# Patient Record
Sex: Female | Born: 1945 | Hispanic: No | Marital: Single | Smoking: Never smoker
Health system: Southern US, Community
[De-identification: ages and names within clinical notes are randomized; demographics above are authoritative.]

## PROBLEM LIST (undated history)

## (undated) DIAGNOSIS — M129 Arthropathy, unspecified: Secondary | ICD-10-CM

## (undated) DIAGNOSIS — E785 Hyperlipidemia, unspecified: Secondary | ICD-10-CM

## (undated) DIAGNOSIS — E669 Obesity, unspecified: Secondary | ICD-10-CM

## (undated) DIAGNOSIS — R002 Palpitations: Secondary | ICD-10-CM

## (undated) DIAGNOSIS — I10 Essential (primary) hypertension: Secondary | ICD-10-CM

## (undated) DIAGNOSIS — R0609 Other forms of dyspnea: Secondary | ICD-10-CM

## (undated) DIAGNOSIS — G5622 Lesion of ulnar nerve, left upper limb: Secondary | ICD-10-CM

## (undated) DIAGNOSIS — C50919 Malignant neoplasm of unspecified site of unspecified female breast: Secondary | ICD-10-CM

## (undated) DIAGNOSIS — M109 Gout, unspecified: Secondary | ICD-10-CM

## (undated) DIAGNOSIS — E210 Primary hyperparathyroidism: Secondary | ICD-10-CM

## (undated) DIAGNOSIS — G5600 Carpal tunnel syndrome, unspecified upper limb: Secondary | ICD-10-CM

## (undated) DIAGNOSIS — R5383 Other fatigue: Secondary | ICD-10-CM

## (undated) DIAGNOSIS — E790 Hyperuricemia without signs of inflammatory arthritis and tophaceous disease: Secondary | ICD-10-CM

## (undated) DIAGNOSIS — R9431 Abnormal electrocardiogram [ECG] [EKG]: Secondary | ICD-10-CM

## (undated) HISTORY — DX: Abnormal electrocardiogram (ECG) (EKG): R94.31

## (undated) HISTORY — DX: Other fatigue: R53.83

## (undated) HISTORY — PX: COLONOSCOPY: SHX174

## (undated) HISTORY — DX: Essential (primary) hypertension: I10

## (undated) HISTORY — PX: ABDOMINAL HYSTERECTOMY: SHX81

## (undated) HISTORY — DX: Lesion of ulnar nerve, left upper limb: G56.22

## (undated) HISTORY — DX: Gout, unspecified: M10.9

## (undated) HISTORY — DX: Malignant neoplasm of unspecified site of unspecified female breast: C50.919

## (undated) HISTORY — DX: Palpitations: R00.2

## (undated) HISTORY — DX: Hyperuricemia without signs of inflammatory arthritis and tophaceous disease: E79.0

## (undated) HISTORY — DX: Arthropathy, unspecified: M12.9

## (undated) HISTORY — DX: Hyperlipidemia, unspecified: E78.5

## (undated) HISTORY — PX: BREAST BIOPSY: SHX20

## (undated) HISTORY — DX: Obesity, unspecified: E66.9

## (undated) HISTORY — DX: Carpal tunnel syndrome, unspecified upper limb: G56.00

---

## 1978-06-26 HISTORY — PX: FACIAL COSMETIC SURGERY: SHX629

## 2009-05-12 ENCOUNTER — Encounter: Payer: Self-pay | Admitting: Internal Medicine

## 2009-11-09 ENCOUNTER — Ambulatory Visit: Payer: Self-pay | Admitting: Internal Medicine

## 2009-11-09 DIAGNOSIS — E785 Hyperlipidemia, unspecified: Secondary | ICD-10-CM

## 2009-11-09 DIAGNOSIS — M129 Arthropathy, unspecified: Secondary | ICD-10-CM | POA: Insufficient documentation

## 2009-11-09 DIAGNOSIS — M109 Gout, unspecified: Secondary | ICD-10-CM

## 2009-11-09 DIAGNOSIS — I10 Essential (primary) hypertension: Secondary | ICD-10-CM | POA: Insufficient documentation

## 2009-12-01 ENCOUNTER — Ambulatory Visit: Payer: Self-pay | Admitting: Internal Medicine

## 2009-12-01 LAB — CONVERTED CEMR LAB: Vit D, 25-Hydroxy: 29 ng/mL — ABNORMAL LOW (ref 30–89)

## 2009-12-03 LAB — CONVERTED CEMR LAB
ALT: 24 units/L (ref 0–35)
AST: 21 units/L (ref 0–37)
Alkaline Phosphatase: 48 units/L (ref 39–117)
BUN: 13 mg/dL (ref 6–23)
Basophils Absolute: 0.1 10*3/uL (ref 0.0–0.1)
Bilirubin, Direct: 0.1 mg/dL (ref 0.0–0.3)
CO2: 29 meq/L (ref 19–32)
GFR calc non Af Amer: 83.95 mL/min (ref >60)
Glucose, Bld: 87 mg/dL (ref 70–99)
HCT: 39.6 % (ref 36.0–46.0)
Lymphs Abs: 2.9 10*3/uL (ref 0.7–4.0)
Microalb Creat Ratio: 0.6 mg/g (ref 0.0–30.0)
Monocytes Absolute: 0.5 10*3/uL (ref 0.1–1.0)
Monocytes Relative: 7.5 % (ref 3.0–12.0)
Platelets: 275 10*3/uL (ref 150.0–400.0)
RDW: 13.7 % (ref 11.5–14.6)
Sodium: 144 meq/L (ref 135–145)
TSH: 3.58 microintl units/mL (ref 0.35–5.50)
Total Protein: 6.9 g/dL (ref 6.0–8.3)

## 2010-01-10 ENCOUNTER — Ambulatory Visit: Payer: Self-pay | Admitting: Internal Medicine

## 2010-01-10 DIAGNOSIS — E669 Obesity, unspecified: Secondary | ICD-10-CM

## 2010-02-03 ENCOUNTER — Encounter: Payer: Self-pay | Admitting: Internal Medicine

## 2010-07-19 ENCOUNTER — Telehealth: Payer: Self-pay | Admitting: Internal Medicine

## 2010-07-26 NOTE — Assessment & Plan Note (Signed)
Summary: fu per pt/njr   Vital Signs:  Patient profile:   65 year old female Menstrual status:  hysterectomy Weight:      231 pounds Pulse rate:   66 / minute BP sitting:   120 / 80  (left arm) Cuff size:   regular  Vitals Entered By: Romualdo Bolk, CMA (AAMA) (January 10, 2010 10:50 AM) CC: Follow-up visit on labs, Hypertension Management   History of Present Illness: Christy Young comes in today  for follow up of multiple medical problems . Since last viist is beginning to change lifestyle to deal with lipids and health risk. Her to recheck labs   Taking lovaza but no whelchol .  hadnt been able to tolerate low dose statin in the past.    Hypertension History:      She denies headache, chest pain, palpitations, dyspnea with exertion, orthopnea, PND, peripheral edema, visual symptoms, neurologic problems, syncope, and side effects from treatment.  She notes no problems with any antihypertensive medication side effects.        Positive major cardiovascular risk factors include female age 79 years old or older, hyperlipidemia, and hypertension.  Negative major cardiovascular risk factors include non-tobacco-user status.     Preventive Screening-Counseling & Management  Alcohol-Tobacco     Alcohol drinks/day: 1     Alcohol type: wine     Smoking Status: never  Caffeine-Diet-Exercise     Caffeine use/day: 1     Does Patient Exercise: yes     Times/week: 3  Current Medications (verified): 1)  Lovaza 1 Gm Caps (Omega-3-Acid Ethyl Esters) .... 2 Tabs X 2 Daily 2)  Allopurinol 100 Mg Tabs (Allopurinol) .... 2 Two Times A Day 3)  Bystolic 5 Mg Tabs (Nebivolol Hcl) .Marland Kitchen.. 1x A Day  Allergies (verified): 1)  ! Penicillin 2)  Crestor (Rosuvastatin Calcium)  Past History:  Past medical, surgical, family and social histories (including risk factors) reviewed, and no changes noted (except as noted below).  Past Medical History: Reviewed history from 11/09/2009 and no changes  required. Hyperlipidemia Hypertension  in 30ss   meds  since 59  G3P2 Palpitations  hospitalized 2005 felt from stress neg cards eval.  Past Surgical History: Reviewed history from 11/09/2009 and no changes required. Hysterectomy facial surgery 1980  Past History:  Care Management: None current  Family History: Reviewed history from 11/09/2009 and no changes required. Father: DM, alcholic- deceased Mother: HBP, arthritis Siblings: sister non-hodgkin lymphoma cancer Son age  15  MI      32 Living son healthy age 73   Social History: Reviewed history from 11/09/2009 and no changes required. Occupation:  retired Patent examiner .   3 yrs of college  bereaved parent  Retired   Single Never Smoked Alcohol use-yes Drug use-no Regular exercise-yes moved In June   no pets .     Review of Systems  The patient denies anorexia, fever, chest pain, syncope, and dyspnea on exertion.         non umbnessn or vision change  Physical Exam  General:  Well-developed,well-nourished,in no acute distress; alert,appropriate and cooperative throughout examination labs sreviewed   anc counseled  ldl over 200 Bg normal  Impression & Recommendations:  Problem # 1:  HYPERLIPIDEMIA (ICD-272.4)  se of statins in the past    problematic  lifestyle intervention should be emphasized    weight watchers .   also added back welchol  sample and rx  Her updated medication list for this problem  includes:    Lovaza 1 Gm Caps (Omega-3-acid ethyl esters) .Marland Kitchen... 2 tabs x 2 daily    Welchol 3.75 Gm Pack (Colesevelam hcl) .Marland Kitchen... 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 2 by mouth once daily  Problem # 3:  HYPERTENSION (ICD-401.9)  Her updated medication list for this problem  includes:    Bystolic 5 Mg Tabs (Nebivolol hcl) .Marland Kitchen... 1x a day  Problem # 4:  ARTHRITIS (ICD-716.90) Assessment: Comment Only  Problem # 5:  OBESITY (ICD-278.00) lifestyle intervention  Ht: 63.5 (11/09/2009)   Wt: 231 (01/10/2010)   BMI: 40.11 (11/09/2009)  Complete Medication List: 1)  Lovaza 1 Gm Caps (Omega-3-acid ethyl esters) .... 2 tabs x 2 daily 2)  Allopurinol 100 Mg Tabs (Allopurinol) .... 2 by mouth once daily 3)  Bystolic 5 Mg Tabs (Nebivolol hcl) .Marland Kitchen.. 1x a day 4)  Welchol 3.75 Gm Pack (Colesevelam hcl) .Marland Kitchen.. 1 pack    q d   in 4 oz of water  Hypertension Assessment/Plan:      The patient's hypertensive risk group is category B: At least one risk factor (excluding diabetes) with no target organ damage.  Today's blood pressure is 120/80.  Her blood pressure goal is < 140/90.  Patient Instructions: 1)  continue taking vit d supplement  2)  Losing weight will help Your   lipids . 3)   welchol  could help    4)  recheck lipid panel in 3 months  and rov  Prescriptions: WELCHOL 3.75 GM PACK (COLESEVELAM HCL) 1 pack    q d   in 4 oz of water  #30 days x 3   Entered and Authorized by:   Madelin Headings MD   Signed by:   Madelin Headings MD on 01/10/2010   Method used:   Print then Give to Patient   RxID:   [id number] LOVAZA 1 GM CAPS (OMEGA-3-ACID ETHYL ESTERS) 2 tabs x 2 daily  #120 x 6   Entered and Authorized by:   Madelin Headings MD   Signed by:   Madelin Headings MD on 01/10/2010   Method used:   Electronically to        [facility]  [phone][facility] (retail)       [address] [place], [place]  [address]       Ph: [phone] or [phone]       Fax: [phone]   RxID:   [id number] ALLOPURINOL 100 MG TABS (ALLOPURINOL) 2 by mouth once daily  #60 x 6   Entered and Authorized by:   Madelin Headings MD   Signed by:   Madelin Headings MD on 01/10/2010   Method used:   Electronically to        [facility]  [phone]* (retail)       [address]  [place], [place]  [address]       Ph: [phone] or [phone]       Fax: [phone]   RxID:   [id number]

## 2010-07-26 NOTE — Assessment & Plan Note (Signed)
Summary: BRAND NEW PT/TO EST/CJR   Vital Signs:  Patient profile:   65 year old female Menstrual status:  hysterectomy Height:      63.5 inches Weight:      229.2 pounds BMI:     40.11 Temp:     98.3 degrees F oral Pulse rate:   87 / minute BP sitting:   136 / 90  (right arm)  Vitals Entered By: Kathrynn Speed CMA (Nov 09, 2009 1:57 PM)  Nutrition Counseling: Patient's BMI is greater than 25 and therefore counseled on weight management options. CC: New pt establish     Menstrual Status hysterectomy   History of Present Illness: Christy Young comes in comes in today  for new patient visit. Her previous care has been in [place].  Her medical problems include : HT :  ? avapro was a problem   and    had memory problems  but then changes ot bystolic.   Has own machine at home.  LIPIDS; On lovaza for 2 years .  last checked   about  Dec 2010.   ? if welchol  for  leg cramps.  Crestor  caused  caused  muyscle problems .    Lipitor and zocor    both caused muscle aches  Niaspan  Trying to exercise to help muscles .    ? no VIt d level.  Gout :  on allopurinol for  about 4-5 years  and increased  for 1 year.   supressing gout.     Preventive Care Screening  Mammogram:    Date:  07/27/2009    Next Due:  01/2010    Results:  abnormal   Bone Density:    Date:  05/26/2009    Results:  normal std dev  Last Tetanus Booster:    Date:  03/26/2009    Results:  Tdap   Colonoscopy:    Date:  06/26/2006    Results:  normal    Preventive Screening-Counseling & Management  Alcohol-Tobacco     Alcohol drinks/day: 1     Alcohol type: wine     Smoking Status: never  Caffeine-Diet-Exercise     Caffeine use/day: 1     Does Patient Exercise: yes     Times/week: 3  Safety-Violence-Falls     Seat Belt Use: yes     Firearms in the Home: no firearms in the home     Smoke Detectors: yes      Drug Use:  no.    Current Medications (verified): 1)  Lovaza 1 Gm Caps (Omega-3-Acid Ethyl  Esters) .... 2 Tabs X 2 Daily 2)  Allopurinol 100 Mg Tabs (Allopurinol) .... 2 Two Times A Day 3)  Bystolic 5 Mg Tabs (Nebivolol Hcl) .Marland Kitchen.. 1x A Day  Allergies (verified): 1)  ! Penicillin 2)  Crestor (Rosuvastatin Calcium)  Past History:  Past Medical History: Hyperlipidemia Hypertension  in 30ss   meds  since 74  G3P2 Palpitations  hospitalized 2005 felt from stress neg cards eval.  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   Smoking Status:  never Caffeine use/day:  1 Does Patient Exercise:  yes Occupation:  employed Drug Use:  no Seat Belt Use:  yes  Review of Systems  The patient denies anorexia, fever, vision loss, decreased hearing, hoarseness, chest pain, syncope, dyspnea on exertion, peripheral edema, prolonged cough, headaches, hemoptysis, abdominal pain, melena, hematochezia, severe indigestion/heartburn, hematuria, muscle weakness, difficulty walking, depression, abnormal bleeding, enlarged lymph nodes, and angioedema.    Physical Exam  General:  Well-developed,well-nourished,in no acute distress; alert,appropriate and cooperative throughout examination Head:  normocephalic and atraumatic.   Eyes:  vision grossly intact and pupils equal.  glases  Ears:  R ear normal, L ear normal, and no external deformities.   Neck:  No deformities, masses, or tenderness noted. Lungs:  Normal respiratory effort, chest expands symmetrically. Lungs are clear to auscultation, no crackles or wheezes. Heart:  Normal rate and regular rhythm. S1 and  S2 normal without gallop, murmur, click, rub or other extra sounds. Abdomen:  Bowel sounds positive,abdomen soft and non-tender without masses, organomegaly or   noted. Pulses:  pulses intact without delay   Extremities:  no clubbing cyanosis or edema  Neurologic:  non focal  Skin:  turgor normal, color normal, no ecchymoses, and no petechiae.   Cervical Nodes:  No lymphadenopathy noted Psych:  Oriented X3, good eye contact, not anxious appearing, and not depressed appearing.     Impression & Recommendations:  Problem # 1:  HYPERTENSION (ICD-401.9)  Her updated medication list for this problem includes:    Bystolic 5 Mg Tabs (Nebivolol hcl) .Marland Kitchen... 1x a day  BP today: 136/90  Problem # 2:  HYPERLIPIDEMIA (ICD-272.4)  Her updated medication list for this problem includes:    Lovaza 1 Gm Caps (Omega-3-acid ethyl esters) .Marland Kitchen... 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 2 two times a day  Complete Medication List: 1)  Lovaza 1 Gm Caps (Omega-3-acid ethyl esters) .... 2 tabs x 2 daily 2)  Allopurinol 100 Mg Tabs (Allopurinol) .... 2 two times a day 3)  Bystolic 5 Mg Tabs (Nebivolol hcl) .Marland Kitchen.. 1x a day  Patient Instructions: 1)  get a copy of  last full set of labs and immunizations .  2)  and get [place] a copy so we can decide on further  3)  schedule  lipids and lfts,.  and uric acid  , BMp, CBCdiff , Tsh , Vit d level     4)  Dx elevated lipids,   HT ,  , gout  muscle    cramps  5)  then plan follow up .  6)  Notify  us   for refills .  Prescriptions: BYSTOLIC 5 MG TABS (NEBIVOLOL HCL) 1x a day  #90 x 1   Entered and Authorized by:   Madelin Headings MD   Signed by:   Madelin Headings MD on 11/09/2009   Method used:   Faxed to ...       [facility] [address] ([facility]-order)       [address] [place], [place]  [address]       Ph: [phone]       Fax: [phone]   RxID:   [id number]  Appended Document: BRAND NEW PT/TO EST/CJR reviewed labs from october .  please add on hg a1c , urine microalbumin / creatinine ratio   to above labs ordered   dx hyperglycemia .        Appended Document: BRAND NEW PT/TO EST/CJR Lab orders added on.

## 2010-07-28 NOTE — Progress Notes (Signed)
Summary: Pt req script for Bystolic to [facility]. Script for mammogram  Phone Note Refill Request Call back at Home Phone [phone] Message from:  Patient on July 19, 2010 2:02 PM  Refills Requested: Medication #1:  BYSTOLIC 5 MG TABS 1x a day   Dosage confirmed as above?Dosage Confirmed   Supply Requested: 3 months Pls call in to [facility] mail order.   Also pt needs to get a script  to get a mammogram done. Pt will pick up script for mammogram when its ready.    Method Requested: Telephone to [facility] mail order Initial call taken by: Lucy Antigua,  July 19, 2010 2:03 PM  Follow-up for Phone Call        Spoke with pt and she needs a diagnostic mmg done for a 6 month follow up on her mmg. I told pt that we would have rx ready to pick up tomorrow. Rx sent to pharmacy.  Pt is aware that she needs a fasting lipids dx 272.4 and rov before next refill. Follow-up by: Romualdo Bolk, CMA Duncan Dull),  July 19, 2010 2:46 PM  Additional Follow-up for Phone Call Additional follow up Details #1::        I calle pt and sch her for fup with Dr Fabian Sharp 08/31/10 and fasting lipids on 08/24/10, as noted.    Additional Follow-up by: Lucy Antigua,  July 20, 2010 10:29 AM    Prescriptions: BYSTOLIC 5 MG TABS (NEBIVOLOL HCL) 1x a day  #90 x 0   Entered by:   Romualdo Bolk, CMA (AAMA)   Authorized by:   Madelin Headings MD   Signed by:   Romualdo Bolk, CMA (AAMA) on 07/19/2010   Method used:   Faxed to ...       [facility] [address] ([facility]-order)       [address] [place], [place]  [address]       Ph: [phone]       Fax: [phone]   RxID:   [id number] BYSTOLIC 5 MG TABS (NEBIVOLOL HCL) 1x a day  #90 x 0   Entered by:   Romualdo Bolk, CMA (AAMA)   Authorized by:   Madelin Headings MD   Signed by:   Romualdo Bolk, CMA (AAMA) on 07/19/2010   Method used:   Printed then faxed to ...       [facility]  (mail-order)       [address] [place], [place]  [address]       Ph: [phone]       Fax: [phone]   RxID:   [id number]  Rx that was printed was shreaded. Romualdo Bolk, CMA (AAMA)  July 19, 2010 3:22 PM

## 2010-08-01 ENCOUNTER — Other Ambulatory Visit: Payer: Self-pay | Admitting: Internal Medicine

## 2010-08-01 DIAGNOSIS — Z1231 Encounter for screening mammogram for malignant neoplasm of breast: Secondary | ICD-10-CM

## 2010-08-04 ENCOUNTER — Ambulatory Visit
Admission: RE | Admit: 2010-08-04 | Discharge: 2010-08-04 | Disposition: A | Discharge status: Home / Self Care | Payer: BC Managed Care – PPO | Source: Ambulatory Visit | Attending: Internal Medicine | Admitting: Internal Medicine

## 2010-08-04 DIAGNOSIS — Z1231 Encounter for screening mammogram for malignant neoplasm of breast: Secondary | ICD-10-CM

## 2010-08-24 ENCOUNTER — Other Ambulatory Visit: Payer: Self-pay

## 2010-08-31 ENCOUNTER — Ambulatory Visit: Payer: Self-pay | Admitting: Internal Medicine

## 2010-09-14 ENCOUNTER — Other Ambulatory Visit (INDEPENDENT_AMBULATORY_CARE_PROVIDER_SITE_OTHER): Payer: PRIVATE HEALTH INSURANCE | Admitting: Internal Medicine

## 2010-09-14 DIAGNOSIS — E785 Hyperlipidemia, unspecified: Secondary | ICD-10-CM

## 2010-09-14 LAB — LDL CHOLESTEROL, DIRECT: Direct LDL: 178.8 mg/dL

## 2010-09-14 LAB — LIPID PANEL
HDL: 56.5 mg/dL (ref >39.00)
Total CHOL/HDL Ratio: 5
Triglycerides: 165 mg/dL — ABNORMAL HIGH (ref 0.0–149.0)

## 2010-09-15 ENCOUNTER — Encounter: Payer: Self-pay | Admitting: Internal Medicine

## 2010-09-20 ENCOUNTER — Encounter: Payer: Self-pay | Admitting: Internal Medicine

## 2010-09-20 ENCOUNTER — Ambulatory Visit (INDEPENDENT_AMBULATORY_CARE_PROVIDER_SITE_OTHER): Payer: PRIVATE HEALTH INSURANCE | Admitting: Internal Medicine

## 2010-09-20 VITALS — BP 130/88 | HR 72 | Wt 232.0 lb

## 2010-09-20 DIAGNOSIS — R002 Palpitations: Secondary | ICD-10-CM

## 2010-09-20 DIAGNOSIS — M109 Gout, unspecified: Secondary | ICD-10-CM

## 2010-09-20 DIAGNOSIS — E669 Obesity, unspecified: Secondary | ICD-10-CM

## 2010-09-20 DIAGNOSIS — E785 Hyperlipidemia, unspecified: Secondary | ICD-10-CM

## 2010-09-20 DIAGNOSIS — I10 Essential (primary) hypertension: Secondary | ICD-10-CM

## 2010-09-20 NOTE — Patient Instructions (Signed)
Intensify life style  Intervention. As we discussed  Plan medica wellness check up    In the summer

## 2010-09-20 NOTE — Progress Notes (Signed)
  Subjective:    Patient ID: Christy Young, female    DOB: Oct 31, 1945, 65 y.o.   MRN: [id number]  HPI Patient comes in today for follow-up of multiple medical problems. See last ov and rec trial of welchol and lsi . HT NO change  LIPIDS: Back on welchol    3 day.  For about a month or so.  Co q 10.  And vit D .   She had the history of side effects of the Crestor   For now and no se   .Marland KitchenShe is  eating better..healthier  Decrease meats increase vegge fruit  And achy is better. .    Trying to be active . "is addicted to food"    No recent gout flare needs refills     Review of Systems No changes hearing vision chest pain shortness of breath fall bruising or bleeding. Rest as per HPI or [place]  Past Medical History  Diagnosis Date  . Hyperlipidemia   . Hypertension     meds since age 27   . Palpitations     hospitalized 2005 felt from stress neg cards eval   Past Surgical History  Procedure Date  . Abdominal hysterectomy   . Facial cosmetic surgery 1980    reports that she has never smoked. She does not have any smokeless tobacco history on file. She reports that she drinks alcohol. She reports that she does not use illicit drugs. family history includes Alcohol abuse in her father; Arthritis in her mother; Diabetes in her father; Heart attack (age of onset:39) in her son; Hypertension in her mother; and Lymphoma in her sister. Allergies  Allergen Reactions  . Penicillins   . Rosuvastatin     REACTION: leg cramps.       Objective:   Physical Exam Wt Readings from Last 3 Encounters:  09/20/10 232 lb (105.235 kg)  01/10/10 231 lb (104.781 kg)  11/09/09 229 lb 3.2 oz (103.964 kg)  WDWN in NAD  HEENT grossly normal Chest nl resp  CV s1 s2 reg rhytm nl perfusion NEuro: non focal  Reviewed labs  Elevated LIPID and ldl in 170 range .    Assessment & Plan:  HYperlipidemia  This is not improved but she just went back on the well: seems to not have a side effect. Dietary changes  may have helped her. Obesity : ongoing and contributing counsel Gout: stable will need to labs done eventually HT:  No change stable  Total visit 28 mins > 50% spent counseling and coordinating care .     lifestyle intervention healthy eating and exercise .

## 2010-09-30 ENCOUNTER — Encounter: Payer: Self-pay | Admitting: Internal Medicine

## 2010-09-30 DIAGNOSIS — R002 Palpitations: Secondary | ICD-10-CM | POA: Insufficient documentation

## 2010-11-15 ENCOUNTER — Telehealth: Payer: Self-pay | Admitting: *Deleted

## 2010-11-15 MED ORDER — NEBIVOLOL HCL 5 MG PO TABS: 5.0000 mg | ORAL_TABLET | Freq: Every day | ORAL | Status: DC

## 2010-11-15 NOTE — Telephone Encounter (Signed)
Refill on bystolic 5mg . Rx sent to [facility].

## 2010-12-21 ENCOUNTER — Ambulatory Visit: Payer: PRIVATE HEALTH INSURANCE | Admitting: Internal Medicine

## 2010-12-26 ENCOUNTER — Ambulatory Visit (INDEPENDENT_AMBULATORY_CARE_PROVIDER_SITE_OTHER): Payer: Medicare Other | Admitting: Internal Medicine

## 2010-12-26 ENCOUNTER — Encounter: Payer: Self-pay | Admitting: Internal Medicine

## 2010-12-26 VITALS — BP 130/80 | HR 78 | Ht 63.75 in | Wt 233.0 lb

## 2010-12-26 DIAGNOSIS — E669 Obesity, unspecified: Secondary | ICD-10-CM

## 2010-12-26 DIAGNOSIS — Z136 Encounter for screening for cardiovascular disorders: Secondary | ICD-10-CM

## 2010-12-26 DIAGNOSIS — E785 Hyperlipidemia, unspecified: Secondary | ICD-10-CM

## 2010-12-26 DIAGNOSIS — E790 Hyperuricemia without signs of inflammatory arthritis and tophaceous disease: Secondary | ICD-10-CM

## 2010-12-26 DIAGNOSIS — I10 Essential (primary) hypertension: Secondary | ICD-10-CM

## 2010-12-26 DIAGNOSIS — Z8249 Family history of ischemic heart disease and other diseases of the circulatory system: Secondary | ICD-10-CM

## 2010-12-26 DIAGNOSIS — Z23 Encounter for immunization: Secondary | ICD-10-CM

## 2010-12-26 DIAGNOSIS — R9431 Abnormal electrocardiogram [ECG] [EKG]: Secondary | ICD-10-CM

## 2010-12-26 DIAGNOSIS — Z0000 Encounter for general adult medical examination without abnormal findings: Secondary | ICD-10-CM

## 2010-12-26 DIAGNOSIS — M129 Arthropathy, unspecified: Secondary | ICD-10-CM

## 2010-12-26 DIAGNOSIS — R5383 Other fatigue: Secondary | ICD-10-CM

## 2010-12-26 NOTE — Progress Notes (Signed)
  Subjective:    Patient ID: Christy Young, female    DOB: 03-23-1946, 65 y.o.   MRN: [id number]  HPI Patient comes in today for preventive visit and follow-up of medical issues. Update of her history since her last visit. She is not on a medicare plan  Since last visit no major change in health but complains of being  Very tired ? If BP med  Taking some vit d   Suppl.   ? Co q 10  Just started the welchol   3 x per week recently.      Starting [employer].  No acute gout flares but never had this anyway on meds for high level although does get joint aches.   Review of Systems ROS:  GEN/ HEENTNo fever, significant weight changes sweats headaches vision problems hearing changes,  Does have "baby cataracts" CV/ PULM; No chest pain  cough, syncope,edema  ? If change in exercise tolerance. GI /GU: No adominal pain, vomiting, change in bowel habits. No blood in the stool. No significant GU symptoms. SKIN/HEME: ,no acute skin rashes suspicious lesions or bleeding. No lymphadenopathy, nodules, masses.  NEURO/ PSYCH:  No neurologic signs such as weakness numbness No depression anxiety. IMM/ Allergy: No unusual infections.  Allergy .   REST of 12 system review negative  Past history family history social history reviewed in the electronic medical record.     Objective:   Physical Exam Physical Exam: Vital signs reviewed WUJ:WJXB is a well-developed well-nourished alert cooperative  A afemale who appears her stated age in no acute distress.  HEENT: normocephalic  traumatic , Eyes: PERRL EOM's full, conjunctiva clear, Nares: paten,t no deformity discharge or tenderness., Ears: no deformity EAC's clear TMs with normal landmarks. Mouth: clear OP, no lesions, edema.  Moist mucous membranes. Dentition in adequate repair. NECK: supple without masses, thyromegaly or bruits. CHEST/PULM:  Clear to auscultation and percussion breath sounds equal no wheeze , rales or rhonchi. No chest wall deformities or  tenderness. CV: PMI is nondisplaced, S1 S2 no gallops, murmurs, rubs. Peripheral pulses are full without delay.No JVD .  Breast: normal by inspection . No dimpling, discharge, masses, tenderness or discharge . LN: no cervical axillary inguinal adenopathy ABDOMEN: Bowel sounds normal nontender  No guard or rebound, no hepato splenomegal no CVA tenderness.  No hernia. Extremtities:  No clubbing cyanosis or edema, no acute joint swelling or redness no focal atrophy NEURO:  Oriented x3, cranial nerves 3-12 appear to be intact, no obvious focal weakness,gait within normal limits no abnormal reflexes or asymmetrical SKIN: No acute rashes normal turgor, color, no bruising or petechiae. PSYCH: Oriented, good eye contact, no obvious depression anxiety, cognition and judgment appear normal.  EKG SNS t wave changes rate   78  Nl intervals       Assessment & Plan:  Preventive Health Care Counseled regarding healthy nutrition, exercise, sleep, injury prevention, calcium vit d and healthy weight .  HT  Stay on same med .Marland Kitchen   of hx of se of others in the pastt( avapro?) Hyperuricemia hx  LIPIDS:  Statin intolerant Obesity  No hx of OSA Fatigue  Family hx of PVD ( sone with MI 39)    EKG ns  t wave changes of unclear significance.  Consider getting echo   Other eval  Had neg stress test  And cards check in 2005

## 2010-12-26 NOTE — Patient Instructions (Signed)
lifestyle intervention healthy eating and exercise .   Will help your arthritis and fatigue . Will get lab tests and then follow up to see if other intervention needed For now no change in Meds.  3500 calories is the energy content of a pound of body weight .Must have a 3500 cal deficit to lose one pound . Thus decrease 500 calorie equivalent per day in food or drink intake / or exercise  for 7 days to lose one pound. Consider weight watchers.

## 2010-12-29 ENCOUNTER — Encounter: Payer: Self-pay | Admitting: Internal Medicine

## 2010-12-29 DIAGNOSIS — R5383 Other fatigue: Secondary | ICD-10-CM | POA: Insufficient documentation

## 2010-12-29 DIAGNOSIS — E790 Hyperuricemia without signs of inflammatory arthritis and tophaceous disease: Secondary | ICD-10-CM | POA: Insufficient documentation

## 2010-12-29 DIAGNOSIS — R9431 Abnormal electrocardiogram [ECG] [EKG]: Secondary | ICD-10-CM | POA: Insufficient documentation

## 2011-01-04 ENCOUNTER — Other Ambulatory Visit (INDEPENDENT_AMBULATORY_CARE_PROVIDER_SITE_OTHER): Payer: Medicare Other

## 2011-01-04 DIAGNOSIS — E785 Hyperlipidemia, unspecified: Secondary | ICD-10-CM

## 2011-01-04 DIAGNOSIS — R5381 Other malaise: Secondary | ICD-10-CM

## 2011-01-04 DIAGNOSIS — M1990 Unspecified osteoarthritis, unspecified site: Secondary | ICD-10-CM

## 2011-01-04 DIAGNOSIS — R5383 Other fatigue: Secondary | ICD-10-CM

## 2011-01-04 DIAGNOSIS — M129 Arthropathy, unspecified: Secondary | ICD-10-CM

## 2011-01-04 LAB — LIPID PANEL
HDL: 48.1 mg/dL (ref >39.00)
Total CHOL/HDL Ratio: 5
Triglycerides: 218 mg/dL — ABNORMAL HIGH (ref 0.0–149.0)

## 2011-01-04 LAB — CBC WITH DIFFERENTIAL/PLATELET
Basophils Absolute: 0.1 10*3/uL (ref 0.0–0.1)
Basophils Relative: 0.8 % (ref 0.0–3.0)
Eosinophils Relative: 3.6 % (ref 0.0–5.0)
Hemoglobin: 13.2 g/dL (ref 12.0–15.0)
Lymphocytes Relative: 50 % — ABNORMAL HIGH (ref 12.0–46.0)
Monocytes Relative: 6.6 % (ref 3.0–12.0)
Neutro Abs: 3.2 10*3/uL (ref 1.4–7.7)
RBC: 4.26 Mil/uL (ref 3.87–5.11)
WBC: 8.2 10*3/uL (ref 4.5–10.5)

## 2011-01-04 LAB — BASIC METABOLIC PANEL
Calcium: 9.2 mg/dL (ref 8.4–10.5)
GFR: 97.17 mL/min (ref >60.00)
Potassium: 4.1 mEq/L (ref 3.5–5.1)
Sodium: 141 mEq/L (ref 135–145)

## 2011-01-04 LAB — LDL CHOLESTEROL, DIRECT: Direct LDL: 167.9 mg/dL

## 2011-01-04 LAB — HEPATIC FUNCTION PANEL
ALT: 27 U/L (ref 0–35)
Albumin: 4.1 g/dL (ref 3.5–5.2)
Total Bilirubin: 0.5 mg/dL (ref 0.3–1.2)

## 2011-01-04 LAB — URIC ACID: Uric Acid, Serum: 5.8 mg/dL (ref 2.4–7.0)

## 2011-01-04 LAB — TSH: TSH: 2.07 u[IU]/mL (ref 0.35–5.50)

## 2011-01-11 ENCOUNTER — Encounter: Payer: Self-pay | Admitting: Internal Medicine

## 2011-01-11 ENCOUNTER — Ambulatory Visit (INDEPENDENT_AMBULATORY_CARE_PROVIDER_SITE_OTHER): Payer: Medicare Other | Admitting: Internal Medicine

## 2011-01-11 VITALS — BP 120/80 | HR 72 | Wt 234.0 lb

## 2011-01-11 DIAGNOSIS — E785 Hyperlipidemia, unspecified: Secondary | ICD-10-CM

## 2011-01-11 DIAGNOSIS — M109 Gout, unspecified: Secondary | ICD-10-CM

## 2011-01-11 DIAGNOSIS — I10 Essential (primary) hypertension: Secondary | ICD-10-CM

## 2011-01-11 DIAGNOSIS — R5383 Other fatigue: Secondary | ICD-10-CM

## 2011-01-11 DIAGNOSIS — M129 Arthropathy, unspecified: Secondary | ICD-10-CM

## 2011-01-11 DIAGNOSIS — E669 Obesity, unspecified: Secondary | ICD-10-CM

## 2011-01-11 NOTE — Patient Instructions (Signed)
Intensify lifestyle interventions. To get lipids down. Check lipids in 3 months and then OV.

## 2011-01-12 NOTE — Progress Notes (Signed)
  Subjective:    Patient ID: Christy Young, female    DOB: 1945-07-11, 65 y.o.   MRN: [id number]  HPI Patient comes in today for follow up of elevated lipids a ndobesity  . No major change in health status since last visit .  she did join curves but hasn't been able to adhere to healthy diet. Lots of stress  knows what to do just has to do it.  Palpitations are better and no cp sob.  Here to review labs   Review of Systems See hpi      Objective:   Physical Exam WDWN in nad  Vs reviewed and labs      Assessment & Plan:  HYperlipidemia     needs to Intensify lifestyle interventions. Has had se of statins in the past. Needs better control Gout hx   Uric acid in goal range HT controlled  Total visit > 50% spent counseling and coordinating care

## 2011-01-31 ENCOUNTER — Telehealth: Payer: Self-pay | Admitting: *Deleted

## 2011-01-31 DIAGNOSIS — R9431 Abnormal electrocardiogram [ECG] [EKG]: Secondary | ICD-10-CM

## 2011-01-31 DIAGNOSIS — R5383 Other fatigue: Secondary | ICD-10-CM

## 2011-01-31 NOTE — Telephone Encounter (Signed)
Per Dr. Fabian Sharp- advise patient would like to order echo cardiogram in addition to her labs dx fatigue and abnormal ekg. To get the results before her follow up appointment. I left pt a message to call back about this and order sent to [facility].

## 2011-02-01 NOTE — Telephone Encounter (Signed)
Pt aware of results 

## 2011-02-03 ENCOUNTER — Ambulatory Visit (HOSPITAL_COMMUNITY): Payer: Medicare Other | Attending: Internal Medicine | Admitting: Radiology

## 2011-02-03 DIAGNOSIS — R9431 Abnormal electrocardiogram [ECG] [EKG]: Secondary | ICD-10-CM | POA: Insufficient documentation

## 2011-02-03 DIAGNOSIS — Z8249 Family history of ischemic heart disease and other diseases of the circulatory system: Secondary | ICD-10-CM | POA: Insufficient documentation

## 2011-02-03 DIAGNOSIS — R5383 Other fatigue: Secondary | ICD-10-CM

## 2011-02-03 DIAGNOSIS — E785 Hyperlipidemia, unspecified: Secondary | ICD-10-CM | POA: Insufficient documentation

## 2011-02-03 DIAGNOSIS — I10 Essential (primary) hypertension: Secondary | ICD-10-CM | POA: Insufficient documentation

## 2011-02-07 NOTE — Progress Notes (Signed)
Pt aware of results 

## 2011-05-26 ENCOUNTER — Other Ambulatory Visit: Payer: Self-pay | Admitting: Internal Medicine

## 2011-10-19 ENCOUNTER — Other Ambulatory Visit: Payer: Self-pay | Admitting: Internal Medicine

## 2012-04-19 DIAGNOSIS — Z1231 Encounter for screening mammogram for malignant neoplasm of breast: Secondary | ICD-10-CM | POA: Diagnosis not present

## 2012-04-22 ENCOUNTER — Encounter: Payer: Self-pay | Admitting: Internal Medicine

## 2012-04-23 ENCOUNTER — Ambulatory Visit (INDEPENDENT_AMBULATORY_CARE_PROVIDER_SITE_OTHER): Payer: Medicare Other | Admitting: Internal Medicine

## 2012-04-23 ENCOUNTER — Encounter: Payer: Self-pay | Admitting: Internal Medicine

## 2012-04-23 VITALS — BP 130/88 | HR 91 | Temp 98.0°F | Wt 230.0 lb

## 2012-04-23 DIAGNOSIS — I10 Essential (primary) hypertension: Secondary | ICD-10-CM

## 2012-04-23 DIAGNOSIS — Z23 Encounter for immunization: Secondary | ICD-10-CM

## 2012-04-23 DIAGNOSIS — E785 Hyperlipidemia, unspecified: Secondary | ICD-10-CM

## 2012-04-23 DIAGNOSIS — R9431 Abnormal electrocardiogram [ECG] [EKG]: Secondary | ICD-10-CM

## 2012-04-23 DIAGNOSIS — R7989 Other specified abnormal findings of blood chemistry: Secondary | ICD-10-CM

## 2012-04-23 DIAGNOSIS — M109 Gout, unspecified: Secondary | ICD-10-CM

## 2012-04-23 DIAGNOSIS — E790 Hyperuricemia without signs of inflammatory arthritis and tophaceous disease: Secondary | ICD-10-CM

## 2012-04-23 DIAGNOSIS — Z8249 Family history of ischemic heart disease and other diseases of the circulatory system: Secondary | ICD-10-CM

## 2012-04-23 MED ORDER — ALLOPURINOL 100 MG PO TABS: 100.0000 mg | ORAL_TABLET | Freq: Every day | ORAL | Status: DC

## 2012-04-23 NOTE — Assessment & Plan Note (Signed)
Taking welchol  Once a day  1 pack  Not fish oil now

## 2012-04-23 NOTE — Patient Instructions (Signed)
Plan fasting labs  I will put in the orders and you get appt time. Someone will contact you about cardiology.   Referral.  Asa 81 mg per day for prevention.  Make sure the BP is in control.

## 2012-04-23 NOTE — Progress Notes (Signed)
Subjective:    Patient ID: Christy Young, female    DOB: [birth year], 66 y.o.   MRN: [id number]  HPI Patient comes in today for follow up of  multiple medical problems.   Sister died and now brother with lung cancer bone and liver.    Has copd.  Her last visit with [place] was almost 15 months ago. Since that time she's had no major change in her health status. She wants to review labs and echo test were done last year and reported to her by phone call. She's no longer taking the beta she takes WelChol and bysystolic and only taking 100 mg of allopurinol and day. She's had no gout attacks. She's not had lab tests done since we did them last.  She has a strong family history of heart disease son died 54 Brother 73. Review of Systems Negative for fever syncope possibly some chest pain off and on no change in exercise tolerance as arthritis has been evaluated in the past and felt to be osteoarthritis knees joints ache. No unusual rashes. Rest as per history of present illness.  Past history family history social history reviewed in the electronic medical record. Outpatient Encounter Prescriptions as of 04/23/2012  Medication Sig Dispense Refill  . allopurinol (ZYLOPRIM) 100 MG tablet Take 1 tablet (100 mg total) by mouth daily.  90 tablet  3  . BYSTOLIC 5 MG tablet TAKE 1 TABLET DAILY  90 tablet  2  . WELCHOL 3.75 G PACK PLACE 1 PACK IN 4 OZ OF WATER EVERY DAY  30 each  1  . DISCONTD: allopurinol (ZYLOPRIM) 100 MG tablet TAKE 2 TABLETS BY MOUTH EVERY DAY  60 tablet  4  . DISCONTD: colesevelam (WELCHOL) 625 MG tablet Take 1,875 mg by mouth 2 (two) times daily with a meal.       . DISCONTD: omega-3 acid ethyl esters (LOVAZA) 1 G capsule Take 2 g by mouth 2 (two) times daily.            Objective:   Physical Exam Blood pressure 136/100, pulse 91, temperature 98 F (36.7 C), temperature source Oral, weight 230 lb (104.327 kg), SpO2 95.00%.  Well-developed well-nourished in no acute distress HEENT  is grossly normal tongue is midline supple without masses or adenopathy chest clear to auscultation cardiac regular rhythm I don't hear an AI murmur today normal heart sounds noted peripheral pulses present without delay. Abdomen soft without organomegaly guarding or rebound. Joints show some OA changes and some NCP changes of her hands in no acute effusions or redness.     Assessment & Plan:   Hyperlipidemia history of statin intolerance in the past Family history of heart disease young age. 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 Normal LV function. Consider repeating echo I advised cardiology consult in regard to her cardiovascular risk and management. Hypertension elevated reading today better on second read important to control cardiovascular risk patient aware. She states usually she thinks her it's normal  History of gout on a very low-dose allopurinol we'll refill today and get a uric acid level with her. Of labs she'll come back for. Of labs fasting plus uric acid an A1c.  Health care maintenance prevention she doesn't usually get flu shots discussed with her risk of heart attacks in people with underlying heart disease to get influenza. She's willing to get  a flu shot today. She has had a mammogram.

## 2012-04-23 NOTE — Assessment & Plan Note (Signed)
Taking 100 mg   Allopurinol  No gout attack. In ast year

## 2012-04-26 ENCOUNTER — Other Ambulatory Visit (INDEPENDENT_AMBULATORY_CARE_PROVIDER_SITE_OTHER): Payer: Medicare Other

## 2012-04-26 ENCOUNTER — Other Ambulatory Visit: Payer: Self-pay | Admitting: Family Medicine

## 2012-04-26 DIAGNOSIS — M109 Gout, unspecified: Secondary | ICD-10-CM

## 2012-04-26 DIAGNOSIS — R9431 Abnormal electrocardiogram [ECG] [EKG]: Secondary | ICD-10-CM

## 2012-04-26 DIAGNOSIS — Z79899 Other long term (current) drug therapy: Secondary | ICD-10-CM

## 2012-04-26 DIAGNOSIS — Z23 Encounter for immunization: Secondary | ICD-10-CM

## 2012-04-26 DIAGNOSIS — I10 Essential (primary) hypertension: Secondary | ICD-10-CM

## 2012-04-26 DIAGNOSIS — E785 Hyperlipidemia, unspecified: Secondary | ICD-10-CM

## 2012-04-26 DIAGNOSIS — E790 Hyperuricemia without signs of inflammatory arthritis and tophaceous disease: Secondary | ICD-10-CM

## 2012-04-26 LAB — LDL CHOLESTEROL, DIRECT: Direct LDL: 159.8 mg/dL

## 2012-04-26 LAB — CBC WITH DIFFERENTIAL/PLATELET
Basophils Absolute: 0.1 10*3/uL (ref 0.0–0.1)
Basophils Relative: 0.8 % (ref 0.0–3.0)
Eosinophils Absolute: 0.3 10*3/uL (ref 0.0–0.7)
HCT: 37.3 % (ref 36.0–46.0)
Hemoglobin: 12.4 g/dL (ref 12.0–15.0)
Lymphs Abs: 2.4 10*3/uL (ref 0.7–4.0)
MCHC: 33.4 g/dL (ref 30.0–36.0)
Monocytes Relative: 7.9 % (ref 3.0–12.0)
Neutro Abs: 3.5 10*3/uL (ref 1.4–7.7)
RDW: 13.7 % (ref 11.5–14.6)

## 2012-04-26 LAB — HEPATIC FUNCTION PANEL
AST: 19 U/L (ref 0–37)
Albumin: 3.6 g/dL (ref 3.5–5.2)
Total Protein: 6.9 g/dL (ref 6.0–8.3)

## 2012-04-26 LAB — BASIC METABOLIC PANEL
CO2: 24 mEq/L (ref 19–32)
Chloride: 107 mEq/L (ref 96–112)
Glucose, Bld: 89 mg/dL (ref 70–99)
Potassium: 3.7 mEq/L (ref 3.5–5.1)
Sodium: 139 mEq/L (ref 135–145)

## 2012-04-26 LAB — POCT URINALYSIS DIPSTICK
Bilirubin, UA: NEGATIVE
Glucose, UA: NEGATIVE
Ketones, UA: NEGATIVE
Leukocytes, UA: NEGATIVE
Nitrite, UA: NEGATIVE
pH, UA: 5

## 2012-04-26 LAB — LIPID PANEL
Cholesterol: 226 mg/dL — ABNORMAL HIGH (ref 0–200)
HDL: 47.1 mg/dL (ref >39.00)
Total CHOL/HDL Ratio: 5
Triglycerides: 127 mg/dL (ref 0.0–149.0)
VLDL: 25.4 mg/dL (ref 0.0–40.0)

## 2012-04-26 LAB — URIC ACID: Uric Acid, Serum: 6.8 mg/dL (ref 2.4–7.0)

## 2012-04-26 LAB — HEMOGLOBIN A1C: Hgb A1c MFr Bld: 5.9 % (ref 4.6–6.5)

## 2012-04-26 MED ORDER — ALLOPURINOL 100 MG PO TABS: 200.0000 mg | ORAL_TABLET | Freq: Every day | ORAL | Status: DC

## 2012-05-14 ENCOUNTER — Encounter: Payer: Self-pay | Admitting: *Deleted

## 2012-05-15 ENCOUNTER — Ambulatory Visit (INDEPENDENT_AMBULATORY_CARE_PROVIDER_SITE_OTHER): Payer: Medicare Other | Admitting: Cardiovascular Disease

## 2012-05-15 ENCOUNTER — Encounter: Payer: Self-pay | Admitting: Cardiovascular Disease

## 2012-05-15 VITALS — BP 133/85 | HR 84 | Wt 226.0 lb

## 2012-05-15 DIAGNOSIS — R002 Palpitations: Secondary | ICD-10-CM

## 2012-05-15 DIAGNOSIS — E785 Hyperlipidemia, unspecified: Secondary | ICD-10-CM

## 2012-05-15 DIAGNOSIS — Z9189 Other specified personal risk factors, not elsewhere classified: Secondary | ICD-10-CM

## 2012-05-15 DIAGNOSIS — Z789 Other specified health status: Secondary | ICD-10-CM

## 2012-05-15 DIAGNOSIS — R9431 Abnormal electrocardiogram [ECG] [EKG]: Secondary | ICD-10-CM

## 2012-05-15 DIAGNOSIS — I10 Essential (primary) hypertension: Secondary | ICD-10-CM

## 2012-05-15 NOTE — Assessment & Plan Note (Signed)
Calcium Score to further risk stratify and motivate to make lifesytle changes also guide aggressiveness of chol Rx since statins not an option

## 2012-05-15 NOTE — Assessment & Plan Note (Signed)
Not significant changes over last 3 years Stable

## 2012-05-15 NOTE — Assessment & Plan Note (Signed)
Well controlled.  Continue current medications and low sodium Dash type diet.    

## 2012-05-15 NOTE — Patient Instructions (Addendum)
Your physician recommends that you schedule a follow-up appointment in:  AS NEEDED  Your physician recommends that you continue on your current medications as directed. Please refer to the Current Medication list given to you today.  CALCIUM SCORE  NOT COVERED BY INSURANCE CALL IF DECIDE TO  DO   [phone]

## 2012-05-15 NOTE — Assessment & Plan Note (Signed)
Discussed diet and possible use red yeast rice.  Intolerant to statins

## 2012-05-15 NOTE — Progress Notes (Signed)
Patient ID: Christy Young, female   DOB: [birth year], 66 y.o.   MRN: [id number] 66 yo referred by Dr Fabian Sharp.  HTN and elevated lipids on Rx  Family history of premature CAD.  Echo last year normal except mild AR She has no symptoms  Activity limited by arthritis Known she needs to be more acitve and lose weight.  Compliant with meds.  Discussed the fact that mild AR not significant and no murmur on exam.  No indication for stress test as she is asymptomatic.  Discussed utility of calcium score and she is interested in this  ROS: Denies fever, malais, weight loss, blurry vision, decreased visual acuity, cough, sputum, SOB, hemoptysis, pleuritic pain, palpitaitons, heartburn, abdominal pain, melena, lower extremity edema, claudication, or rash.  All other systems reviewed and negative   General: Affect appropriate Overweight black female HEENT: normal Neck supple with no adenopathy JVP normal no bruits no thyromegaly Lungs clear with no wheezing and good diaphragmatic motion Heart:  S1/S2 no murmur,rub, gallop or click PMI normal Abdomen: benighn, BS positve, no tenderness, no AAA no bruit.  No HSM or HJR Distal pulses intact with no bruits No edema Neuro non-focal Skin warm and dry No muscular weakness  Medications Current Outpatient Prescriptions  Medication Sig Dispense Refill  . allopurinol (ZYLOPRIM) 100 MG tablet Take 2 tablets (200 mg total) by mouth daily.  180 tablet  0  . BYSTOLIC 5 MG tablet TAKE 1 TABLET DAILY  90 tablet  2    Allergies Penicillins and Rosuvastatin  Family History: Family History  Problem Relation Age of Onset  . Hypertension Mother   . Arthritis Mother   . Alcohol abuse Father     deceased  . Diabetes Father   . Lymphoma Sister     non hodgkins  . Heart attack Son 35    1999  . Lung cancer Brother     Social History: History   Social History  . Marital Status: Single    Spouse Name: N/A    Number of Children: N/A  . Years of Education:  N/A   Occupational History  . Not on file.   Social History Main Topics  . Smoking status: Never Smoker   . Smokeless tobacco: Not on file  . Alcohol Use: Yes  . Drug Use: No  . Sexually Active:    Other Topics Concern  . Not on file   Social History Narrative   Occupation: retired Patent examiner, 3 yrs of collegeBereaved parentSingleMoved in 12/19/22 No [id number] died of cancer lymphoma this year and bro now with lung cancer and spread.    Electrocardiogram:  12/26/10  SR rate 78 nonspecific ST/T wave changes  Today SR rate 84 LAE poor R wave progression limited change from previous  Assessment and Plan

## 2012-06-03 ENCOUNTER — Other Ambulatory Visit: Payer: Self-pay | Admitting: Internal Medicine

## 2012-07-05 ENCOUNTER — Other Ambulatory Visit: Payer: Self-pay | Admitting: Internal Medicine

## 2013-01-27 ENCOUNTER — Other Ambulatory Visit: Payer: Self-pay | Admitting: Internal Medicine

## 2013-04-04 ENCOUNTER — Encounter: Payer: Medicare Other | Admitting: Internal Medicine

## 2013-04-11 ENCOUNTER — Ambulatory Visit (INDEPENDENT_AMBULATORY_CARE_PROVIDER_SITE_OTHER): Payer: Medicare Other | Admitting: Internal Medicine

## 2013-04-11 ENCOUNTER — Encounter: Payer: Self-pay | Admitting: Internal Medicine

## 2013-04-11 VITALS — BP 124/90 | HR 82 | Temp 97.4°F | Wt 226.0 lb

## 2013-04-11 DIAGNOSIS — Z23 Encounter for immunization: Secondary | ICD-10-CM

## 2013-04-11 DIAGNOSIS — Z888 Allergy status to other drugs, medicaments and biological substances status: Secondary | ICD-10-CM

## 2013-04-11 DIAGNOSIS — Z0000 Encounter for general adult medical examination without abnormal findings: Secondary | ICD-10-CM | POA: Insufficient documentation

## 2013-04-11 DIAGNOSIS — E785 Hyperlipidemia, unspecified: Secondary | ICD-10-CM

## 2013-04-11 DIAGNOSIS — I10 Essential (primary) hypertension: Secondary | ICD-10-CM

## 2013-04-11 DIAGNOSIS — E669 Obesity, unspecified: Secondary | ICD-10-CM | POA: Insufficient documentation

## 2013-04-11 DIAGNOSIS — Z789 Other specified health status: Secondary | ICD-10-CM

## 2013-04-11 DIAGNOSIS — M109 Gout, unspecified: Secondary | ICD-10-CM

## 2013-04-11 DIAGNOSIS — M129 Arthropathy, unspecified: Secondary | ICD-10-CM

## 2013-04-11 LAB — HEPATIC FUNCTION PANEL
AST: 18 U/L (ref 0–37)
Albumin: 4 g/dL (ref 3.5–5.2)

## 2013-04-11 LAB — CBC WITH DIFFERENTIAL/PLATELET
Basophils Absolute: 0.1 10*3/uL (ref 0.0–0.1)
Eosinophils Absolute: 0.2 10*3/uL (ref 0.0–0.7)
HCT: 38.9 % (ref 36.0–46.0)
Hemoglobin: 13.4 g/dL (ref 12.0–15.0)
Lymphs Abs: 3.1 10*3/uL (ref 0.7–4.0)
MCHC: 34.3 g/dL (ref 30.0–36.0)
MCV: 88.3 fl (ref 78.0–100.0)
Monocytes Absolute: 0.6 10*3/uL (ref 0.1–1.0)
Neutro Abs: 3.9 10*3/uL (ref 1.4–7.7)
RDW: 14 % (ref 11.5–14.6)

## 2013-04-11 LAB — LIPID PANEL
Cholesterol: 254 mg/dL — ABNORMAL HIGH (ref 0–200)
Triglycerides: 151 mg/dL — ABNORMAL HIGH (ref 0.0–149.0)

## 2013-04-11 LAB — POCT URINALYSIS DIP (MANUAL ENTRY)
Ketones, POC UA: NEGATIVE
Leukocytes, UA: NEGATIVE
pH, UA: 5.5

## 2013-04-11 LAB — BASIC METABOLIC PANEL
BUN: 14 mg/dL (ref 6–23)
CO2: 27 mEq/L (ref 19–32)
Glucose, Bld: 93 mg/dL (ref 70–99)
Potassium: 4 mEq/L (ref 3.5–5.1)
Sodium: 141 mEq/L (ref 135–145)

## 2013-04-11 LAB — URIC ACID: Uric Acid, Serum: 5.9 mg/dL (ref 2.4–7.0)

## 2013-04-11 LAB — TSH: TSH: 2.11 u[IU]/mL (ref 0.35–5.50)

## 2013-04-11 NOTE — Patient Instructions (Addendum)
Consider setting goals of weigh loss  Maybe 10 pounds in 4- 6 months .   150 minutes of exercise weeks  ,  Lose weight  To healthy levels. Avoid trans fats and processed foods;  Increase fresh fruits and veges to 5 servings per day. And avoid sweet beverages  Including tea and juice.  Neck pain is probably arthritis pain . Ok to stop the [employer]  For now.  Call when want  Colon referral  screening   Yearly check  If labs ok   Preventive Care for Adults, Female A healthy lifestyle and preventive care can promote health and wellness. Preventive health guidelines for women include the following key practices.  A routine yearly physical is a good way to check with your caregiver about your health and preventive screening. It is a chance to share any concerns and updates on your health, and to receive a thorough exam.  Visit your dentist for a routine exam and preventive care every 6 months. Brush your teeth twice a day and floss once a day. Good oral hygiene prevents tooth decay and gum disease.  The frequency of eye exams is based on your age, health, family medical history, use of contact lenses, and other factors. Follow your caregiver's recommendations for frequency of eye exams.  Eat a healthy diet. Foods like vegetables, fruits, whole grains, low-fat dairy products, and lean protein foods contain the nutrients you need without too many calories. Decrease your intake of foods high in solid fats, added sugars, and salt. Eat the right amount of calories for you.Get information about a proper diet from your caregiver, if necessary.  Regular physical exercise is one of the most important things you can do for your health. Most adults should get at least 150 minutes of moderate-intensity exercise (any activity that increases your heart rate and causes you to sweat) each week. In addition, most adults need muscle-strengthening exercises on 2 or more days a week.  Maintain a healthy weight. The  body mass index (BMI) is a screening tool to identify possible weight problems. It provides an estimate of body fat based on height and weight. Your caregiver can help determine your BMI, and can help you achieve or maintain a healthy weight.For adults 20 years and older:  A BMI below 18.5 is considered underweight.  A BMI of 18.5 to 24.9 is normal.  A BMI of 25 to 29.9 is considered overweight.  A BMI of 30 and above is considered obese.  Maintain normal blood lipids and cholesterol levels by exercising and minimizing your intake of saturated fat. Eat a balanced diet with plenty of fruit and vegetables. Blood tests for lipids and cholesterol should begin at age 40 and be repeated every 5 years. If your lipid or cholesterol levels are high, you are over 50, or you are at high risk for heart disease, you may need your cholesterol levels checked more frequently.Ongoing high lipid and cholesterol levels should be treated with medicines if diet and exercise are not effective.  If you smoke, find out from your caregiver how to quit. If you do not use tobacco, do not start.  If you are pregnant, do not drink alcohol. If you are breastfeeding, be very cautious about drinking alcohol. If you are not pregnant and choose to drink alcohol, do not exceed 1 drink per day. One drink is considered to be 12 ounces (355 mL) of beer, 5 ounces (148 mL) of wine, or 1.5 ounces (44 mL) of liquor.  Avoid use of street drugs. Do not share needles with anyone. Ask for help if you need support or instructions about stopping the use of drugs.  High blood pressure causes heart disease and increases the risk of stroke. Your blood pressure should be checked at least every 1 to 2 years. Ongoing high blood pressure should be treated with medicines if weight loss and exercise are not effective.  If you are 26 to 67 years old, ask your caregiver if you should take aspirin to prevent strokes.  Diabetes screening involves  taking a blood sample to check your fasting blood sugar level. This should be done once every 3 years, after age 27, if you are within normal weight and without risk factors for diabetes. Testing should be considered at a younger age or be carried out more frequently if you are overweight and have at least 1 risk factor for diabetes.  Breast cancer screening is essential preventive care for women. You should practice "breast self-awareness." This means understanding the normal appearance and feel of your breasts and may include breast self-examination. Any changes detected, no matter how small, should be reported to a caregiver. Women in their 67s and 30s should have a clinical breast exam (CBE) by a caregiver as part of a regular health exam every 1 to 3 years. After age 30, women should have a CBE every year. Starting at age 31, women should consider having a mammography (breast X-ray test) every year. Women who have a family history of breast cancer should talk to their caregiver about genetic screening. Women at a high risk of breast cancer should talk to their caregivers about having magnetic resonance imaging (MRI) and a mammography every year.  The Pap test is a screening test for cervical cancer. A Pap test can show cell changes on the cervix that might become cervical cancer if left untreated. A Pap test is a procedure in which cells are obtained and examined from the lower end of the uterus (cervix).  Women should have a Pap test starting at age 85.  Between ages 104 and 39, Pap tests should be repeated every 2 years.  Beginning at age 68, you should have a Pap test every 3 years as long as the past 3 Pap tests have been normal.  Some women have medical problems that increase the chance of getting cervical cancer. Talk to your caregiver about these problems. It is especially important to talk to your caregiver if a new problem develops soon after your last Pap test. In these cases, your  caregiver may recommend more frequent screening and Pap tests.  The above recommendations are the same for women who have or have not gotten the vaccine for human papillomavirus (HPV).  If you had a hysterectomy for a problem that was not cancer or a condition that could lead to cancer, then you no longer need Pap tests. Even if you no longer need a Pap test, a regular exam is a good idea to make sure no other problems are starting.  If you are between ages 67 and 83, and you have had normal Pap tests going back 10 years, you no longer need Pap tests. Even if you no longer need a Pap test, a regular exam is a good idea to make sure no other problems are starting.  If you have had past treatment for cervical cancer or a condition that could lead to cancer, you need Pap tests and screening for cancer for at least  20 years after your treatment.  If Pap tests have been discontinued, risk factors (such as a new sexual partner) need to be reassessed to determine if screening should be resumed.  The HPV test is an additional test that may be used for cervical cancer screening. The HPV test looks for the virus that can cause the cell changes on the cervix. The cells collected during the Pap test can be tested for HPV. The HPV test could be used to screen women aged 19 years and older, and should be used in women of any age who have unclear Pap test results. After the age of 41, women should have HPV testing at the same frequency as a Pap test.  Colorectal cancer can be detected and often prevented. Most routine colorectal cancer screening begins at the age of 39 and continues through age 2. However, your caregiver may recommend screening at an earlier age if you have risk factors for colon cancer. On a yearly basis, your caregiver may provide home test kits to check for hidden blood in the stool. Use of a small camera at the end of a tube, to directly examine the colon (sigmoidoscopy or colonoscopy), can  detect the earliest forms of colorectal cancer. Talk to your caregiver about this at age 57, when routine screening begins. Direct examination of the colon should be repeated every 5 to 10 years through age 70, unless early forms of pre-cancerous polyps or small growths are found.  Hepatitis C blood testing is recommended for all people born from [birth year] through 1965 and any individual with known risks for hepatitis C.  Practice safe sex. Use condoms and avoid high-risk sexual practices to reduce the spread of sexually transmitted infections (STIs). STIs include gonorrhea, chlamydia, syphilis, trichomonas, herpes, HPV, and human immunodeficiency virus (HIV). Herpes, HIV, and HPV are viral illnesses that have no cure. They can result in disability, cancer, and death. Sexually active women aged 49 and younger should be checked for chlamydia. Older women with new or multiple partners should also be tested for chlamydia. Testing for other STIs is recommended if you are sexually active and at increased risk.  Osteoporosis is a disease in which the bones lose minerals and strength with aging. This can result in serious bone fractures. The risk of osteoporosis can be identified using a bone density scan. Women ages 66 and over and women at risk for fractures or osteoporosis should discuss screening with their caregivers. Ask your caregiver whether you should take a calcium supplement or vitamin D to reduce the rate of osteoporosis.  Menopause can be associated with physical symptoms and risks. Hormone replacement therapy is available to decrease symptoms and risks. You should talk to your caregiver about whether hormone replacement therapy is right for you.  Use sunscreen with sun protection factor (SPF) of 30 or more. Apply sunscreen liberally and repeatedly throughout the day. You should seek shade when your shadow is shorter than you. Protect yourself by wearing long sleeves, pants, a wide-brimmed hat, and  sunglasses year round, whenever you are outdoors.  Once a month, do a whole body skin exam, using a mirror to look at the skin on your back. Notify your caregiver of new moles, moles that have irregular borders, moles that are larger than a pencil eraser, or moles that have changed in shape or color.  Stay current with required immunizations.  Influenza. You need a dose every fall (or winter). The composition of the flu vaccine changes each year, so  being vaccinated once is not enough.  Pneumococcal polysaccharide. You need 1 to 2 doses if you smoke cigarettes or if you have certain chronic medical conditions. You need 1 dose at age 90 (or older) if you have never been vaccinated.  Tetanus, diphtheria, pertussis (Tdap, Td). Get 1 dose of Tdap vaccine if you are younger than age 44, are over 31 and have contact with an infant, are a Research scientist (physical sciences), are pregnant, or simply want to be protected from whooping cough. After that, you need a Td booster dose every 10 years. Consult your caregiver if you have not had at least 3 tetanus and diphtheria-containing shots sometime in your life or have a deep or dirty wound.  HPV. You need this vaccine if you are a woman age 11 or younger. The vaccine is given in 3 doses over 6 months.  Measles, mumps, rubella (MMR). You need at least 1 dose of MMR if you were born in 1957 or later. You may also need a second dose.  Meningococcal. If you are age 57 to 57 and a first-year college student living in a residence hall, or have one of several medical conditions, you need to get vaccinated against meningococcal disease. You may also need additional booster doses.  Zoster (shingles). If you are age 65 or older, you should get this vaccine.  Varicella (chickenpox). If you have never had chickenpox or you were vaccinated but received only 1 dose, talk to your caregiver to find out if you need this vaccine.  Hepatitis A. You need this vaccine if you have a specific  risk factor for hepatitis A virus infection or you simply wish to be protected from this disease. The vaccine is usually given as 2 doses, 6 to 18 months apart.  Hepatitis B. You need this vaccine if you have a specific risk factor for hepatitis B virus infection or you simply wish to be protected from this disease. The vaccine is given in 3 doses, usually over 6 months. Preventive Services / Frequency Ages 43 to 49  Blood pressure check.** / Every 1 to 2 years.  Lipid and cholesterol check.** / Every 5 years beginning at age 41.  Clinical breast exam.** / Every 3 years for women in their 75s and 30s.  Pap test.** / Every 2 years from ages 57 through 22. Every 3 years starting at age 44 through age 59 or 89 with a history of 3 consecutive normal Pap tests.  HPV screening.** / Every 3 years from ages 72 through ages 62 to 4 with a history of 3 consecutive normal Pap tests.  Hepatitis C blood test.** / For any individual with known risks for hepatitis C.  Skin self-exam. / Monthly.  Influenza immunization.** / Every year.  Pneumococcal polysaccharide immunization.** / 1 to 2 doses if you smoke cigarettes or if you have certain chronic medical conditions.  Tetanus, diphtheria, pertussis (Tdap, Td) immunization. / A one-time dose of Tdap vaccine. After that, you need a Td booster dose every 10 years.  HPV immunization. / 3 doses over 6 months, if you are 72 and younger.  Measles, mumps, rubella (MMR) immunization. / You need at least 1 dose of MMR if you were born in 1957 or later. You may also need a second dose.  Meningococcal immunization. / 1 dose if you are age 70 to 39 and a first-year college student living in a residence hall, or have one of several medical conditions, you need to get vaccinated against  meningococcal disease. You may also need additional booster doses.  Varicella immunization.** / Consult your caregiver.  Hepatitis A immunization.** / Consult your caregiver. 2  doses, 6 to 18 months apart.  Hepatitis B immunization.** / Consult your caregiver. 3 doses usually over 6 months. Ages 73 to 77  Blood pressure check.** / Every 1 to 2 years.  Lipid and cholesterol check.** / Every 5 years beginning at age 23.  Clinical breast exam.** / Every year after age 13.  Mammogram.** / Every year beginning at age 71 and continuing for as long as you are in good health. Consult with your caregiver.  Pap test.** / Every 3 years starting at age 27 through age 66 or 26 with a history of 3 consecutive normal Pap tests.  HPV screening.** / Every 3 years from ages 52 through ages 79 to 35 with a history of 3 consecutive normal Pap tests.  Fecal occult blood test (FOBT) of stool. / Every year beginning at age 84 and continuing until age 40. You may not need to do this test if you get a colonoscopy every 10 years.  Flexible sigmoidoscopy or colonoscopy.** / Every 5 years for a flexible sigmoidoscopy or every 10 years for a colonoscopy beginning at age 32 and continuing until age 17.  Hepatitis C blood test.** / For all people born from [birth year] through 1965 and any individual with known risks for hepatitis C.  Skin self-exam. / Monthly.  Influenza immunization.** / Every year.  Pneumococcal polysaccharide immunization.** / 1 to 2 doses if you smoke cigarettes or if you have certain chronic medical conditions.  Tetanus, diphtheria, pertussis (Tdap, Td) immunization.** / A one-time dose of Tdap vaccine. After that, you need a Td booster dose every 10 years.  Measles, mumps, rubella (MMR) immunization. / You need at least 1 dose of MMR if you were born in 1957 or later. You may also need a second dose.  Varicella immunization.** / Consult your caregiver.  Meningococcal immunization.** / Consult your caregiver.  Hepatitis A immunization.** / Consult your caregiver. 2 doses, 6 to 18 months apart.  Hepatitis B immunization.** / Consult your caregiver. 3 doses, usually  over 6 months. Ages 87 and over  Blood pressure check.** / Every 1 to 2 years.  Lipid and cholesterol check.** / Every 5 years beginning at age 45.  Clinical breast exam.** / Every year after age 18.  Mammogram.** / Every year beginning at age 55 and continuing for as long as you are in good health. Consult with your caregiver.  Pap test.** / Every 3 years starting at age 40 through age 13 or 4 with a 3 consecutive normal Pap tests. Testing can be stopped between 65 and 70 with 3 consecutive normal Pap tests and no abnormal Pap or HPV tests in the past 10 years.  HPV screening.** / Every 3 years from ages 16 through ages 29 or 39 with a history of 3 consecutive normal Pap tests. Testing can be stopped between 65 and 70 with 3 consecutive normal Pap tests and no abnormal Pap or HPV tests in the past 10 years.  Fecal occult blood test (FOBT) of stool. / Every year beginning at age 60 and continuing until age 58. You may not need to do this test if you get a colonoscopy every 10 years.  Flexible sigmoidoscopy or colonoscopy.** / Every 5 years for a flexible sigmoidoscopy or every 10 years for a colonoscopy beginning at age 97 and continuing until age 75.  Hepatitis C blood test.** / For all people born from [birth year] through 1965 and any individual with known risks for hepatitis C.  Osteoporosis screening.** / A one-time screening for women ages 44 and over and women at risk for fractures or osteoporosis.  Skin self-exam. / Monthly.  Influenza immunization.** / Every year.  Pneumococcal polysaccharide immunization.** / 1 dose at age 29 (or older) if you have never been vaccinated.  Tetanus, diphtheria, pertussis (Tdap, Td) immunization. / A one-time dose of Tdap vaccine if you are over 65 and have contact with an infant, are a Research scientist (physical sciences), or simply want to be protected from whooping cough. After that, you need a Td booster dose every 10 years.  Varicella immunization.** / Consult your  caregiver.  Meningococcal immunization.** / Consult your caregiver.  Hepatitis A immunization.** / Consult your caregiver. 2 doses, 6 to 18 months apart.  Hepatitis B immunization.** / Check with your caregiver. 3 doses, usually over 6 months. ** Family history and personal history of risk and conditions may change your caregiver's recommendations. Document Released: 08/08/2001 Document Revised: 09/04/2011 Document Reviewed: 11/07/2010 [facility] Patient Information 2014 [place], [place].

## 2013-04-11 NOTE — Progress Notes (Signed)
Chief Complaint  Patient presents with  . Medicare Wellness    HPI: Patient comes in today for Preventive Medicare wellness visit . No major injuries, ed visits ,hospitalizations , new medications since last visit. ? welchol caused rash and blister and too expensive 80$ per months  So stopped it temporarily  Would like to see lipid results off med.   Has neck pain more the the left not weaker down arms   Hearing:  Good   Vision:  No limitations at present . Last eye check UTD  Safety:  Has smoke detector and wears seat belts.  No firearms. No excess sun exposure. Sees dentist regularly.  Falls: none  Advance directive :  Reviewed    Memory: Felt to be good  , no concern from her or her family.  Depression: No anhedonia see screening  unusual crying or depressive symptoms  Nutrition: Eats well balanced diet; adequate calcium and vitamin D. No swallowing chewing problems.  Injury: no major injuries in the last six months.  Other healthcare providers:  Reviewed today .  Social:  Lives alone . No pets.   Preventive parameters: up-to-date  Reviewed   ADLS:   There are no problems or need for assistance  driving, feeding, obtaining food, dressing, toileting and bathing, managing money using phone. She is independent.  EXERCISE/ HABITS  Per week  Daily walking  Stretches  No tobacco    etohl;ess than 1 per day    ROS:  GEN/ HEENT: No fever, significant weight changes sweats headaches vision problems hearing changes, CV/ PULM; No chest pain shortness of breath cough, syncope,edema  change in exercise tolerance. GI /GU: No adominal pain, vomiting, change in bowel habits. No blood in the stool. No significant GU symptoms. SKIN/HEME: ,no acute skin rashes  Legs ? On whelchol not nowsuspicious lesions or bleeding. No lymphadenopathy, nodules, masses.  NEURO/ PSYCH:  No neurologic signs such as weakness numbness. No depression anxiety. IMM/ Allergy: No unusual infections.  Allergy .    REST of 12 system review negative except as per HPI   Past Medical History  Diagnosis Date  . Hyperlipidemia   . Hypertension     meds since age 51   . Palpitations     hospitalized 2005 felt from stress neg cards eval  . Gout, unspecified   . OBESITY   . ARTHRITIS   . Fatigue   . Nonspecific abnormal electrocardiogram (ECG) (EKG)     t wave non acute    . Hyperuricemia     Family History  Problem Relation Age of Onset  . Hypertension Mother   . Arthritis Mother   . Alcohol abuse Father     deceased  . Diabetes Father   . Lymphoma Sister     non hodgkins  . Heart attack Son 62    1999  . Lung cancer Brother     History   Social History  . Marital Status: Single    Spouse Name: N/A    Number of Children: N/A  . Years of Education: N/A   Social History Main Topics  . Smoking status: Never Smoker   . Smokeless tobacco: None  . Alcohol Use: Yes  . Drug Use: No  . Sexual Activity:    Other Topics Concern  . None   Social History Narrative   Occupation: retired Patent examiner, 3 yrs of college   Bereaved parent   Single   Moved in June    No pets  G3P2   Sis died of cancer lymphoma 52  and bro now with lung cancer and spread.died Jun 09, 2023.    Outpatient Encounter Prescriptions as of 04/11/2013  Medication Sig Dispense Refill  . allopurinol (ZYLOPRIM) 100 MG tablet TAKE 2 TABLETS (200MG )     DAILY  180 tablet  0  . BYSTOLIC 5 MG tablet TAKE 1 TABLET DAILY  90 tablet  2  . [DISCONTINUED] WELCHOL 3.75 G PACK PLACE 1 PACKET IN 4 OZ OF WATER EVERY DAY  30 each  5   No facility-administered encounter medications on file as of 04/11/2013.    EXAM:  BP 124/90  Pulse 82  Temp(Src) 97.4 F (36.3 C) (Oral)  Wt 226 lb (102.513 kg)  BMI 39.11 kg/m2  SpO2 96%  Body mass index is 39.11 kg/(m^2).  Physical Exam: Vital signs reviewed WGN:FAOZ is a well-developed well-nourished alertf cooperative   who appears stated age in no acute distress.  HEENT:  normocephalic atraumatic , Eyes: PERRL EOM's full, conjunctiva clear, Nares: paten,t no deformity discharge or tenderness., Ears: no deformity EAC's clear TMs with normal landmarks. Mouth: clear OP, no lesions, edema.  Moist mucous membranes. Dentition in adequate repair. NECK: supple without masses, thyromegaly or bruits. Some kypohosis  Tenderness left trap  CHEST/PULM:  Clear to auscultation and percussion breath sounds equal no wheeze , rales or rhonchi. No chest wall deformities or tenderness. Breast: normal by inspection . No dimpling, discharge, masses, tenderness or discharge . CV: PMI is nondisplaced, S1 S2 no gallops, murmurs, rubs. Peripheral pulses are full without delay.No JVD .  ABDOMEN: Bowel sounds normal nontender  No guard or rebound, no hepato splenomegal no CVA tenderness.  No hernia. Extremtities:  No clubbing cyanosis or edema, no acute joint swelling or redness no focal atrophy NEURO:  Oriented x3, cranial nerves 3-12 appear to be intact, no obvious focal weakness,gait within normal limits no abnormal reflexes or asymmetrical SKIN: No acute rashes normal turgor, color, no bruising or petechiae. PSYCH: Oriented, good eye contact, no obvious depression anxiety, cognition and judgment appear normal. LN: no cervical axillary inguinal adenopathy No noted deficits in memory, attention, and speech.  Wt Readings from Last 3 Encounters:  04/11/13 226 lb (102.513 kg)  05/15/12 226 lb (102.513 kg)  04/23/12 230 lb (104.327 kg)    Lab Results  Component Value Date   WBC 7.8 04/11/2013   HGB 13.4 04/11/2013   HCT 38.9 04/11/2013   PLT 275.0 04/11/2013   GLUCOSE 93 04/11/2013   CHOL 254* 04/11/2013   TRIG 151.0* 04/11/2013   HDL 53.10 04/11/2013   LDLDIRECT 180.4 04/11/2013   ALT 25 04/11/2013   AST 18 04/11/2013   NA 141 04/11/2013   K 4.0 04/11/2013   CL 105 04/11/2013   CREATININE 0.8 04/11/2013   BUN 14 04/11/2013   CO2 27 04/11/2013   TSH 2.11 04/11/2013   HGBA1C  5.9 04/26/2012   MICROALBUR 1.4 12/01/2009    ASSESSMENT AND PLAN:  Discussed the following assessment and plan:  Visit for preventive health examination - call for colonoscopy referral. - Plan: POCT urinalysis dipstick  HYPERLIPIDEMIA - off med statin intolerant  by report - Plan: Flu Vaccine QUAD 36+ mos PF IM (Fluarix), Basic metabolic panel, CBC with Differential, Hepatic function panel, TSH, Uric acid, Lipid panel, POCT urinalysis dipstick  Gout, unspecified - no attack check UA level - Plan: Flu Vaccine QUAD 36+ mos PF IM (Fluarix), Basic metabolic panel, CBC with Differential, Hepatic function panel,  TSH, Uric acid, Lipid panel, POCT urinalysis dipstick  Need for prophylactic vaccination and inoculation against influenza - Plan: Flu Vaccine QUAD 36+ mos PF IM (Fluarix), Basic metabolic panel, CBC with Differential, Hepatic function panel, TSH, Uric acid, Lipid panel, POCT urinalysis dipstick  HYPERTENSION - Plan: Flu Vaccine QUAD 36+ mos PF IM (Fluarix), Basic metabolic panel, CBC with Differential, Hepatic function panel, TSH, Uric acid, Lipid panel, POCT urinalysis dipstick  ARTHRITIS - neck pain probably from arthritis if progressive contact us for intervention - Plan: Flu Vaccine QUAD 36+ mos PF IM (Fluarix), Basic metabolic panel, CBC with Differential, Hepatic function panel, TSH, Uric acid, Lipid panel, POCT urinalysis dipstick  Statin intolerance Dis pap hasnt had one in a while but normal recently usu none 65 and over is helpful but if she feels she has had inadequate screening we could do one now or next year.    Patient Care Team: Madelin Headings, MD as PCP - General  Patient Instructions  Consider setting goals of weigh loss  Maybe 10 pounds in 4- 6 months .   150 minutes of exercise weeks  ,  Lose weight  To healthy levels. Avoid trans fats and processed foods;  Increase fresh fruits and veges to 5 servings per day. And avoid sweet beverages  Including tea and  juice.  Neck pain is probably arthritis pain . Ok to stop the [employer]  For now.  Call when want  Colon referral  screening   Yearly check  If labs ok   Preventive Care for Adults, Female A healthy lifestyle and preventive care can promote health and wellness. Preventive health guidelines for women include the following key practices.  A routine yearly physical is a good way to check with your caregiver about your health and preventive screening. It is a chance to share any concerns and updates on your health, and to receive a thorough exam.  Visit your dentist for a routine exam and preventive care every 6 months. Brush your teeth twice a day and floss once a day. Good oral hygiene prevents tooth decay and gum disease.  The frequency of eye exams is based on your age, health, family medical history, use of contact lenses, and other factors. Follow your caregiver's recommendations for frequency of eye exams.  Eat a healthy diet. Foods like vegetables, fruits, whole grains, low-fat dairy products, and lean protein foods contain the nutrients you need without too many calories. Decrease your intake of foods high in solid fats, added sugars, and salt. Eat the right amount of calories for you.Get information about a proper diet from your caregiver, if necessary.  Regular physical exercise is one of the most important things you can do for your health. Most adults should get at least 150 minutes of moderate-intensity exercise (any activity that increases your heart rate and causes you to sweat) each week. In addition, most adults need muscle-strengthening exercises on 2 or more days a week.  Maintain a healthy weight. The body mass index (BMI) is a screening tool to identify possible weight problems. It provides an estimate of body fat based on height and weight. Your caregiver can help determine your BMI, and can help you achieve or maintain a healthy weight.For adults 20 years and older:  A BMI  below 18.5 is considered underweight.  A BMI of 18.5 to 24.9 is normal.  A BMI of 25 to 29.9 is considered overweight.  A BMI of 30 and above is considered obese.  Maintain  normal blood lipids and cholesterol levels by exercising and minimizing your intake of saturated fat. Eat a balanced diet with plenty of fruit and vegetables. Blood tests for lipids and cholesterol should begin at age 44 and be repeated every 5 years. If your lipid or cholesterol levels are high, you are over 50, or you are at high risk for heart disease, you may need your cholesterol levels checked more frequently.Ongoing high lipid and cholesterol levels should be treated with medicines if diet and exercise are not effective.  If you smoke, find out from your caregiver how to quit. If you do not use tobacco, do not start.  If you are pregnant, do not drink alcohol. If you are breastfeeding, be very cautious about drinking alcohol. If you are not pregnant and choose to drink alcohol, do not exceed 1 drink per day. One drink is considered to be 12 ounces (355 mL) of beer, 5 ounces (148 mL) of wine, or 1.5 ounces (44 mL) of liquor.  Avoid use of street drugs. Do not share needles with anyone. Ask for help if you need support or instructions about stopping the use of drugs.  High blood pressure causes heart disease and increases the risk of stroke. Your blood pressure should be checked at least every 1 to 2 years. Ongoing high blood pressure should be treated with medicines if weight loss and exercise are not effective.  If you are 54 to 67 years old, ask your caregiver if you should take aspirin to prevent strokes.  Diabetes screening involves taking a blood sample to check your fasting blood sugar level. This should be done once every 3 years, after age 9, if you are within normal weight and without risk factors for diabetes. Testing should be considered at a younger age or be carried out more frequently if you are  overweight and have at least 1 risk factor for diabetes.  Breast cancer screening is essential preventive care for women. You should practice "breast self-awareness." This means understanding the normal appearance and feel of your breasts and may include breast self-examination. Any changes detected, no matter how small, should be reported to a caregiver. Women in their 72s and 30s should have a clinical breast exam (CBE) by a caregiver as part of a regular health exam every 1 to 3 years. After age 19, women should have a CBE every year. Starting at age 66, women should consider having a mammography (breast X-ray test) every year. Women who have a family history of breast cancer should talk to their caregiver about genetic screening. Women at a high risk of breast cancer should talk to their caregivers about having magnetic resonance imaging (MRI) and a mammography every year.  The Pap test is a screening test for cervical cancer. A Pap test can show cell changes on the cervix that might become cervical cancer if left untreated. A Pap test is a procedure in which cells are obtained and examined from the lower end of the uterus (cervix).  Women should have a Pap test starting at age 32.  Between ages 2 and 62, Pap tests should be repeated every 2 years.  Beginning at age 20, you should have a Pap test every 3 years as long as the past 3 Pap tests have been normal.  Some women have medical problems that increase the chance of getting cervical cancer. Talk to your caregiver about these problems. It is especially important to talk to your caregiver if a new problem develops  soon after your last Pap test. In these cases, your caregiver may recommend more frequent screening and Pap tests.  The above recommendations are the same for women who have or have not gotten the vaccine for human papillomavirus (HPV).  If you had a hysterectomy for a problem that was not cancer or a condition that could lead to  cancer, then you no longer need Pap tests. Even if you no longer need a Pap test, a regular exam is a good idea to make sure no other problems are starting.  If you are between ages 18 and 34, and you have had normal Pap tests going back 10 years, you no longer need Pap tests. Even if you no longer need a Pap test, a regular exam is a good idea to make sure no other problems are starting.  If you have had past treatment for cervical cancer or a condition that could lead to cancer, you need Pap tests and screening for cancer for at least 20 years after your treatment.  If Pap tests have been discontinued, risk factors (such as a new sexual partner) need to be reassessed to determine if screening should be resumed.  The HPV test is an additional test that may be used for cervical cancer screening. The HPV test looks for the virus that can cause the cell changes on the cervix. The cells collected during the Pap test can be tested for HPV. The HPV test could be used to screen women aged 56 years and older, and should be used in women of any age who have unclear Pap test results. After the age of 53, women should have HPV testing at the same frequency as a Pap test.  Colorectal cancer can be detected and often prevented. Most routine colorectal cancer screening begins at the age of 59 and continues through age 85. However, your caregiver may recommend screening at an earlier age if you have risk factors for colon cancer. On a yearly basis, your caregiver may provide home test kits to check for hidden blood in the stool. Use of a small camera at the end of a tube, to directly examine the colon (sigmoidoscopy or colonoscopy), can detect the earliest forms of colorectal cancer. Talk to your caregiver about this at age 30, when routine screening begins. Direct examination of the colon should be repeated every 5 to 10 years through age 23, unless early forms of pre-cancerous polyps or small growths are  found.  Hepatitis C blood testing is recommended for all people born from [birth year] through 1965 and any individual with known risks for hepatitis C.  Practice safe sex. Use condoms and avoid high-risk sexual practices to reduce the spread of sexually transmitted infections (STIs). STIs include gonorrhea, chlamydia, syphilis, trichomonas, herpes, HPV, and human immunodeficiency virus (HIV). Herpes, HIV, and HPV are viral illnesses that have no cure. They can result in disability, cancer, and death. Sexually active women aged 38 and younger should be checked for chlamydia. Older women with new or multiple partners should also be tested for chlamydia. Testing for other STIs is recommended if you are sexually active and at increased risk.  Osteoporosis is a disease in which the bones lose minerals and strength with aging. This can result in serious bone fractures. The risk of osteoporosis can be identified using a bone density scan. Women ages 68 and over and women at risk for fractures or osteoporosis should discuss screening with their caregivers. Ask your caregiver whether you should take  a calcium supplement or vitamin D to reduce the rate of osteoporosis.  Menopause can be associated with physical symptoms and risks. Hormone replacement therapy is available to decrease symptoms and risks. You should talk to your caregiver about whether hormone replacement therapy is right for you.  Use sunscreen with sun protection factor (SPF) of 30 or more. Apply sunscreen liberally and repeatedly throughout the day. You should seek shade when your shadow is shorter than you. Protect yourself by wearing long sleeves, pants, a wide-brimmed hat, and sunglasses year round, whenever you are outdoors.  Once a month, do a whole body skin exam, using a mirror to look at the skin on your back. Notify your caregiver of new moles, moles that have irregular borders, moles that are larger than a pencil eraser, or moles that have  changed in shape or color.  Stay current with required immunizations.  Influenza. You need a dose every fall (or winter). The composition of the flu vaccine changes each year, so being vaccinated once is not enough.  Pneumococcal polysaccharide. You need 1 to 2 doses if you smoke cigarettes or if you have certain chronic medical conditions. You need 1 dose at age 79 (or older) if you have never been vaccinated.  Tetanus, diphtheria, pertussis (Tdap, Td). Get 1 dose of Tdap vaccine if you are younger than age 74, are over 26 and have contact with an infant, are a Research scientist (physical sciences), are pregnant, or simply want to be protected from whooping cough. After that, you need a Td booster dose every 10 years. Consult your caregiver if you have not had at least 3 tetanus and diphtheria-containing shots sometime in your life or have a deep or dirty wound.  HPV. You need this vaccine if you are a woman age 59 or younger. The vaccine is given in 3 doses over 6 months.  Measles, mumps, rubella (MMR). You need at least 1 dose of MMR if you were born in 1957 or later. You may also need a second dose.  Meningococcal. If you are age 28 to 62 and a first-year college student living in a residence hall, or have one of several medical conditions, you need to get vaccinated against meningococcal disease. You may also need additional booster doses.  Zoster (shingles). If you are age 16 or older, you should get this vaccine.  Varicella (chickenpox). If you have never had chickenpox or you were vaccinated but received only 1 dose, talk to your caregiver to find out if you need this vaccine.  Hepatitis A. You need this vaccine if you have a specific risk factor for hepatitis A virus infection or you simply wish to be protected from this disease. The vaccine is usually given as 2 doses, 6 to 18 months apart.  Hepatitis B. You need this vaccine if you have a specific risk factor for hepatitis B virus infection or you  simply wish to be protected from this disease. The vaccine is given in 3 doses, usually over 6 months. Preventive Services / Frequency Ages 11 to 58  Blood pressure check.** / Every 1 to 2 years.  Lipid and cholesterol check.** / Every 5 years beginning at age 6.  Clinical breast exam.** / Every 3 years for women in their 66s and 30s.  Pap test.** / Every 2 years from ages 51 through 54. Every 3 years starting at age 24 through age 23 or 83 with a history of 3 consecutive normal Pap tests.  HPV screening.** / Every  3 years from ages 4 through ages 69 to 47 with a history of 3 consecutive normal Pap tests.  Hepatitis C blood test.** / For any individual with known risks for hepatitis C.  Skin self-exam. / Monthly.  Influenza immunization.** / Every year.  Pneumococcal polysaccharide immunization.** / 1 to 2 doses if you smoke cigarettes or if you have certain chronic medical conditions.  Tetanus, diphtheria, pertussis (Tdap, Td) immunization. / A one-time dose of Tdap vaccine. After that, you need a Td booster dose every 10 years.  HPV immunization. / 3 doses over 6 months, if you are 95 and younger.  Measles, mumps, rubella (MMR) immunization. / You need at least 1 dose of MMR if you were born in 1957 or later. You may also need a second dose.  Meningococcal immunization. / 1 dose if you are age 39 to 30 and a first-year college student living in a residence hall, or have one of several medical conditions, you need to get vaccinated against meningococcal disease. You may also need additional booster doses.  Varicella immunization.** / Consult your caregiver.  Hepatitis A immunization.** / Consult your caregiver. 2 doses, 6 to 18 months apart.  Hepatitis B immunization.** / Consult your caregiver. 3 doses usually over 6 months. Ages 43 to 62  Blood pressure check.** / Every 1 to 2 years.  Lipid and cholesterol check.** / Every 5 years beginning at age 2.  Clinical breast  exam.** / Every year after age 62.  Mammogram.** / Every year beginning at age 28 and continuing for as long as you are in good health. Consult with your caregiver.  Pap test.** / Every 3 years starting at age 46 through age 55 or 84 with a history of 3 consecutive normal Pap tests.  HPV screening.** / Every 3 years from ages 65 through ages 31 to 43 with a history of 3 consecutive normal Pap tests.  Fecal occult blood test (FOBT) of stool. / Every year beginning at age 44 and continuing until age 55. You may not need to do this test if you get a colonoscopy every 10 years.  Flexible sigmoidoscopy or colonoscopy.** / Every 5 years for a flexible sigmoidoscopy or every 10 years for a colonoscopy beginning at age 26 and continuing until age 7.  Hepatitis C blood test.** / For all people born from [birth year] through 1965 and any individual with known risks for hepatitis C.  Skin self-exam. / Monthly.  Influenza immunization.** / Every year.  Pneumococcal polysaccharide immunization.** / 1 to 2 doses if you smoke cigarettes or if you have certain chronic medical conditions.  Tetanus, diphtheria, pertussis (Tdap, Td) immunization.** / A one-time dose of Tdap vaccine. After that, you need a Td booster dose every 10 years.  Measles, mumps, rubella (MMR) immunization. / You need at least 1 dose of MMR if you were born in 1957 or later. You may also need a second dose.  Varicella immunization.** / Consult your caregiver.  Meningococcal immunization.** / Consult your caregiver.  Hepatitis A immunization.** / Consult your caregiver. 2 doses, 6 to 18 months apart.  Hepatitis B immunization.** / Consult your caregiver. 3 doses, usually over 6 months. Ages 102 and over  Blood pressure check.** / Every 1 to 2 years.  Lipid and cholesterol check.** / Every 5 years beginning at age 67.  Clinical breast exam.** / Every year after age 75.  Mammogram.** / Every year beginning at age 8 and continuing  for as long as you  are in good health. Consult with your caregiver.  Pap test.** / Every 3 years starting at age 12 through age 68 or 20 with a 3 consecutive normal Pap tests. Testing can be stopped between 65 and 70 with 3 consecutive normal Pap tests and no abnormal Pap or HPV tests in the past 10 years.  HPV screening.** / Every 3 years from ages 33 through ages 43 or 76 with a history of 3 consecutive normal Pap tests. Testing can be stopped between 65 and 70 with 3 consecutive normal Pap tests and no abnormal Pap or HPV tests in the past 10 years.  Fecal occult blood test (FOBT) of stool. / Every year beginning at age 36 and continuing until age 71. You may not need to do this test if you get a colonoscopy every 10 years.  Flexible sigmoidoscopy or colonoscopy.** / Every 5 years for a flexible sigmoidoscopy or every 10 years for a colonoscopy beginning at age 39 and continuing until age 72.  Hepatitis C blood test.** / For all people born from 72 through 1965 and any individual with known risks for hepatitis C.  Osteoporosis screening.** / A one-time screening for women ages 79 and over and women at risk for fractures or osteoporosis.  Skin self-exam. / Monthly.  Influenza immunization.** / Every year.  Pneumococcal polysaccharide immunization.** / 1 dose at age 40 (or older) if you have never been vaccinated.  Tetanus, diphtheria, pertussis (Tdap, Td) immunization. / A one-time dose of Tdap vaccine if you are over 65 and have contact with an infant, are a Research scientist (physical sciences), or simply want to be protected from whooping cough. After that, you need a Td booster dose every 10 years.  Varicella immunization.** / Consult your caregiver.  Meningococcal immunization.** / Consult your caregiver.  Hepatitis A immunization.** / Consult your caregiver. 2 doses, 6 to 18 months apart.  Hepatitis B immunization.** / Check with your caregiver. 3 doses, usually over 6 months. ** Family history  and personal history of risk and conditions may change your caregiver's recommendations. Document Released: 08/08/2001 Document Revised: 09/04/2011 Document Reviewed: 11/07/2010 [facility] Patient Information 2014 [place], [place].     Neta Mends. Ansel Ferrall M.D.  Health Maintenance  Topic Date Due  . Zostavax  10/23/2005  . Influenza Vaccine  01/24/2013  . Mammogram  04/19/2014  . Colonoscopy  06/26/2016  . Tetanus/tdap  03/27/2019  . Pneumococcal Polysaccharide Vaccine Age 83 And Over  Completed   Health Maintenance Review }

## 2013-04-16 ENCOUNTER — Encounter: Payer: Self-pay | Admitting: Internal Medicine

## 2013-04-18 MED ORDER — NEBIVOLOL HCL 5 MG PO TABS: ORAL_TABLET | ORAL | Status: DC

## 2013-04-18 MED ORDER — PRAVASTATIN SODIUM 20 MG PO TABS: 20.0000 mg | ORAL_TABLET | Freq: Every day | ORAL | Status: DC

## 2013-04-18 NOTE — Addendum Note (Signed)
Addended by: Azucena Freed on: 04/18/2013 04:57 PM   Modules accepted: Orders

## 2013-04-18 NOTE — Progress Notes (Signed)
Quick Note:  Called and spoke with pt and pt is aware. Pt would like to try the pravastatin first. Please send to [facility]. ______

## 2013-09-11 ENCOUNTER — Other Ambulatory Visit: Payer: Self-pay | Admitting: Internal Medicine

## 2014-01-30 ENCOUNTER — Other Ambulatory Visit: Payer: Self-pay | Admitting: Internal Medicine

## 2014-02-13 DIAGNOSIS — K5730 Diverticulosis of large intestine without perforation or abscess without bleeding: Secondary | ICD-10-CM | POA: Diagnosis not present

## 2014-02-13 DIAGNOSIS — D126 Benign neoplasm of colon, unspecified: Secondary | ICD-10-CM | POA: Diagnosis not present

## 2014-02-13 DIAGNOSIS — Z1211 Encounter for screening for malignant neoplasm of colon: Secondary | ICD-10-CM | POA: Diagnosis not present

## 2014-02-13 DIAGNOSIS — K648 Other hemorrhoids: Secondary | ICD-10-CM | POA: Diagnosis not present

## 2014-02-13 LAB — HM COLONOSCOPY

## 2014-02-27 ENCOUNTER — Encounter: Payer: Self-pay | Admitting: Internal Medicine

## 2014-05-11 ENCOUNTER — Telehealth: Payer: Self-pay | Admitting: Internal Medicine

## 2014-05-11 MED ORDER — NEBIVOLOL HCL 5 MG PO TABS: ORAL_TABLET | ORAL | Status: DC

## 2014-05-11 MED ORDER — ALLOPURINOL 100 MG PO TABS: ORAL_TABLET | ORAL | Status: DC

## 2014-05-11 NOTE — Telephone Encounter (Signed)
Please add BYSTOLIC 5 MG tablet to the below request.

## 2014-05-11 NOTE — Telephone Encounter (Signed)
[facility] [place] [place] is requesting re-fill on allopurinol (ZYLOPRIM) 100 MG tablet

## 2014-05-11 NOTE — Telephone Encounter (Signed)
Sent to the pharmacy by e-scribe for 90 days.  Pt has upcoming CPX on 07/08/14.

## 2014-07-08 ENCOUNTER — Encounter: Payer: Self-pay | Admitting: Internal Medicine

## 2014-07-08 ENCOUNTER — Ambulatory Visit (INDEPENDENT_AMBULATORY_CARE_PROVIDER_SITE_OTHER): Payer: Medicare Other | Admitting: Internal Medicine

## 2014-07-08 VITALS — BP 132/80 | HR 74 | Temp 98.3°F | Ht 63.25 in | Wt 225.9 lb

## 2014-07-08 DIAGNOSIS — R739 Hyperglycemia, unspecified: Secondary | ICD-10-CM

## 2014-07-08 DIAGNOSIS — I10 Essential (primary) hypertension: Secondary | ICD-10-CM | POA: Diagnosis not present

## 2014-07-08 DIAGNOSIS — M1000 Idiopathic gout, unspecified site: Secondary | ICD-10-CM

## 2014-07-08 DIAGNOSIS — R202 Paresthesia of skin: Secondary | ICD-10-CM | POA: Diagnosis not present

## 2014-07-08 DIAGNOSIS — R7309 Other abnormal glucose: Secondary | ICD-10-CM

## 2014-07-08 DIAGNOSIS — Z5181 Encounter for therapeutic drug level monitoring: Secondary | ICD-10-CM

## 2014-07-08 DIAGNOSIS — R200 Anesthesia of skin: Secondary | ICD-10-CM

## 2014-07-08 DIAGNOSIS — Z0000 Encounter for general adult medical examination without abnormal findings: Secondary | ICD-10-CM

## 2014-07-08 DIAGNOSIS — Z889 Allergy status to unspecified drugs, medicaments and biological substances status: Secondary | ICD-10-CM

## 2014-07-08 DIAGNOSIS — Z789 Other specified health status: Secondary | ICD-10-CM

## 2014-07-08 DIAGNOSIS — E785 Hyperlipidemia, unspecified: Secondary | ICD-10-CM

## 2014-07-08 DIAGNOSIS — Z79899 Other long term (current) drug therapy: Secondary | ICD-10-CM | POA: Diagnosis not present

## 2014-07-08 DIAGNOSIS — R350 Frequency of micturition: Secondary | ICD-10-CM

## 2014-07-08 DIAGNOSIS — R52 Pain, unspecified: Secondary | ICD-10-CM | POA: Diagnosis not present

## 2014-07-08 LAB — CBC WITH DIFFERENTIAL/PLATELET
BASOS PCT: 0.6 % (ref 0.0–3.0)
Basophils Absolute: 0 10*3/uL (ref 0.0–0.1)
Eosinophils Absolute: 0.2 10*3/uL (ref 0.0–0.7)
Eosinophils Relative: 2.5 % (ref 0.0–5.0)
HCT: 41.1 % (ref 36.0–46.0)
Hemoglobin: 13.5 g/dL (ref 12.0–15.0)
LYMPHS PCT: 46 % (ref 12.0–46.0)
Lymphs Abs: 3 10*3/uL (ref 0.7–4.0)
MCHC: 32.8 g/dL (ref 30.0–36.0)
MCV: 90.3 fl (ref 78.0–100.0)
Monocytes Absolute: 0.4 10*3/uL (ref 0.1–1.0)
Monocytes Relative: 6.9 % (ref 3.0–12.0)
NEUTROS PCT: 44 % (ref 43.0–77.0)
Neutro Abs: 2.9 10*3/uL (ref 1.4–7.7)
PLATELETS: 305 10*3/uL (ref 150.0–400.0)
RBC: 4.55 Mil/uL (ref 3.87–5.11)
RDW: 14.4 % (ref 11.5–15.5)
WBC: 6.5 10*3/uL (ref 4.0–10.5)

## 2014-07-08 LAB — BASIC METABOLIC PANEL
BUN: 12 mg/dL (ref 6–23)
CALCIUM: 9.7 mg/dL (ref 8.4–10.5)
CHLORIDE: 109 meq/L (ref 96–112)
CO2: 26 meq/L (ref 19–32)
Creatinine, Ser: 0.74 mg/dL (ref 0.40–1.20)
GFR: 82.78 mL/min (ref >60.00)
Glucose, Bld: 98 mg/dL (ref 70–99)
Potassium: 4.3 mEq/L (ref 3.5–5.1)
Sodium: 142 mEq/L (ref 135–145)

## 2014-07-08 LAB — HEPATIC FUNCTION PANEL
ALK PHOS: 53 U/L (ref 39–117)
ALT: 20 U/L (ref 0–35)
AST: 17 U/L (ref 0–37)
Albumin: 4.1 g/dL (ref 3.5–5.2)
BILIRUBIN DIRECT: 0.2 mg/dL (ref 0.0–0.3)
BILIRUBIN TOTAL: 0.6 mg/dL (ref 0.2–1.2)
Total Protein: 7.5 g/dL (ref 6.0–8.3)

## 2014-07-08 LAB — URIC ACID: Uric Acid, Serum: 6 mg/dL (ref 2.4–7.0)

## 2014-07-08 LAB — TSH: TSH: 1.98 u[IU]/mL (ref 0.35–4.50)

## 2014-07-08 LAB — HEMOGLOBIN A1C: HEMOGLOBIN A1C: 6 % (ref 4.6–6.5)

## 2014-07-08 LAB — POCT URINALYSIS DIP (MANUAL ENTRY)
BILIRUBIN UA: NEGATIVE
BILIRUBIN UA: NEGATIVE
GLUCOSE UA: NEGATIVE
Leukocytes, UA: NEGATIVE
Nitrite, UA: NEGATIVE
PH UA: 5
Protein Ur, POC: NEGATIVE
Spec Grav, UA: 1.025
UROBILINOGEN UA: 0.2

## 2014-07-08 LAB — LIPID PANEL
Cholesterol: 221 mg/dL — ABNORMAL HIGH (ref 0–200)
HDL: 55.7 mg/dL (ref >39.00)
LDL Cholesterol: 139 mg/dL — ABNORMAL HIGH (ref 0–99)
NONHDL: 165.3
Total CHOL/HDL Ratio: 4
Triglycerides: 131 mg/dL (ref 0.0–149.0)
VLDL: 26.2 mg/dL (ref 0.0–40.0)

## 2014-07-08 LAB — SEDIMENTATION RATE: Sed Rate: 3 mm/hr (ref 0–22)

## 2014-07-08 LAB — CK: Total CK: 68 U/L (ref 7–177)

## 2014-07-08 NOTE — Progress Notes (Signed)
Chief Complaint  Patient presents with  . Medicare Wellness    ocass num left 4 68finger  arthritis medications    HPI: Patient comes in today for Preventive Medicare wellness visit . And Chronic disease management   BP has been ok an no change in meds Lipids: stopped taking   prava for a while thinking cause muscle aches  Back o nit for about 60 days [place] all over ? Arthritis no fever weight tloss change bowel urin frequency concern if she could have DM but could just be getting older  No major injuries, ed visits ,hospitalizations , new medications since last visit.  Health Maintenance  Topic Date Due  . ZOSTAVAX  10/23/2005  . DEXA SCAN  10/24/2010  . INFLUENZA VACCINE  01/24/2014  . MAMMOGRAM  04/16/2015  . TETANUS/TDAP  03/27/2019  . COLONOSCOPY  02/14/2024  . PNEUMOCOCCAL POLYSACCHARIDE VACCINE AGE 69 AND OVER  Completed   Health Maintenance Review LIFESTYLE:  Exercise:   2-4 per week walking utd  Tobacco/ETS:no Alcohol: less than 1 per day  Sugar beverages: coffee  Sleep: 6 hours  Drug use: no Bone density:  Colonoscopy:  utd 2015    Hearing:  Ringing in left ear aches at time.   Vision:  No limitations at present . Last eye check UTD  Safety:  Has smoke detector and wears seat belts.  No firearms. No excess sun exposure. Sees dentist regularly.  Falls:  no  Advance directive :  Reviewed    Memory: Felt to be good  , no concern from her or her family.  Depression: No anhedonia unusual crying or depressive symptoms  Nutrition: Eats well balanced diet; adequate calcium and vitamin D. No swallowing chewing problems.  Injury: no major injuries in the last six months.  Other healthcare providers:  Reviewed today .  Social:  Lives alone . No pets.   Preventive parameters: up-to-date  Reviewed   ADLS:   There are no problems or need for assistance  driving, feeding, obtaining food, dressing, toileting and bathing, managing money using phone.  is  independent.  ROS:  GEN/ HEENT: No fever, significant weight changes sweats headaches vision problems hearing changes, CV/ PULM; No chest pain shortness of breath cough, syncope,edema  change in exercise tolerance. GI /GU: No adominal pain, vomiting, change in bowel habits. No blood in the stool. No significant GU symptoms. SKIN/HEME: ,no acute skin rashes suspicious lesions or bleeding. No lymphadenopathy, nodules, masses.  NEURO/ PSYCH:  No neurologic signs such as weakness numbness. No depression anxiety. IMM/ Allergy: No unusual infections.  Allergy .   REST of 12 system review negative except as per HPI   Past Medical History  Diagnosis Date  . Hyperlipidemia   . Hypertension     meds since age 69   . Palpitations     hospitalized 2005 felt from stress neg cards eval  . Gout, unspecified   . OBESITY   . ARTHRITIS   . Fatigue   . Nonspecific abnormal electrocardiogram (ECG) (EKG)     t wave non acute    . Hyperuricemia     Family History  Problem Relation Age of Onset  . Hypertension Mother   . Arthritis Mother   . Alcohol abuse Father     deceased  . Diabetes Father   . Lymphoma Sister     non hodgkins  . Heart attack Son 5    1999  . Lung cancer Brother  History   Social History  . Marital Status: Single    Spouse Name: N/A    Number of Children: N/A  . Years of Education: N/A   Social History Main Topics  . Smoking status: Never Smoker   . Smokeless tobacco: None  . Alcohol Use: Yes  . Drug Use: No  . Sexual Activity: None   Other Topics Concern  . 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    Outpatient Encounter Prescriptions as of 07/08/2014  Medication Sig  . allopurinol (ZYLOPRIM) 100 MG tablet TAKE 2 TABLETS (=200MG )    DAILY  . nebivolol  (BYSTOLIC) 5 MG tablet TAKE 1 TABLET DAILY  . pravastatin (PRAVACHOL) 20 MG tablet TAKE 1 TABLET DAILY    EXAM:  BP 132/80 mmHg  Pulse 74  Temp(Src) 98.3 F (36.8 C) (Oral)  Ht 5' 3.25" (1.607 m)  Wt 225 lb 14.4 oz (102.468 kg)  BMI 39.68 kg/m2  SpO2 95%  Body mass index is 39.68 kg/(m^2).  Physical Exam: Vital signs reviewed IRJ:JOAC is a well-developed well-nourished alert cooperative   who appears stated age in no acute distress.  HEENT: normocephalic atraumatic , Eyes: PERRL EOM's full, conjunctiva clear, Nares: paten,t no deformity discharge or tenderness., Ears: no deformity EAC's clear TMs with normal landmarks. Mouth: clear OP, no lesions, edema.  Moist mucous membranes. Dentition in adequate repair. NECK: supple without masses, thyromegaly or bruits. CHEST/PULM:  Clear to auscultation and percussion breath sounds equal no wheeze , rales or rhonchi. No chest wall deformities or tenderness. CV: PMI is nondisplaced, S1 S2 no gallops, murmurs, rubs. Peripheral pulses are full without delay.No JVD .  Breast: normal by inspection . No dimpling, discharge, masses, tenderness or discharge . ABDOMEN: Bowel sounds normal nontender  No guard or rebound, no hepato splenomegal no CVA tenderness.  No hernia. Extremtities:  No clubbing cyanosis or edema, no acute joint swelling or redness no focal atrophy NEURO:  Oriented x3, cranial nerves 3-12 appear to be intact, no obvious focal weakness,gait within normal limits no abnormal reflexes or asymmetrical grip nl no atrophy SKIN: No acute rashes normal turgor, color, no bruising or petechiae. PSYCH: Oriented, good eye contact, no obvious depression anxiety, cognition and judgment appear normal. LN: no cervical axillary inguinal adenopathy No noted deficits in memory, attention, and speech.  Taking prava every other day  ASSESSMENT AND PLAN:  Discussed the following assessment and plan:  Medicare annual wellness visit, subsequent - see  scanned hx document  Statin intolerance - ?  uncertain if pra causing sx but she thinks it may be  - Plan: Basic metabolic panel, CBC with Differential, Hepatic function panel, Lipid panel, TSH, Uric acid, Sedimentation rate, Hemoglobin A1c, POCT urinalysis dipstick  Hyperlipidemia - Plan: Basic metabolic panel, CBC with Differential, Hepatic function panel, Lipid panel, TSH, Uric acid, Sedimentation rate, Hemoglobin A1c, POCT urinalysis dipstick  Idiopathic gout, unspecified chronicity, unspecified site - no recnet flare on suppressive sx  - Plan: Basic metabolic panel, CBC with Differential, Hepatic function panel, Lipid panel, TSH, Uric acid, Sedimentation rate, Hemoglobin A1c, POCT urinalysis dipstick  Essential hypertension - controlled  - Plan: Basic metabolic panel, CBC with Differential, Hepatic function panel, Lipid panel, TSH,  Uric acid, Sedimentation rate, Hemoglobin A1c, POCT urinalysis dipstick  Urinary frequency - Plan: Basic metabolic panel, CBC with Differential, Hepatic function panel, Lipid panel, TSH, Uric acid, Sedimentation rate, Hemoglobin A1c, POCT urinalysis dipstick  Medication monitoring encounter - Plan: Basic metabolic panel, CBC with Differential, Hepatic function panel, Lipid panel, TSH, Uric acid, Sedimentation rate, Hemoglobin A1c, POCT urinalysis dipstick  Body aches - thinks it could be the statin  at higher dose so taking only the last 60 days  - Plan: Basic metabolic panel, CBC with Differential, Hepatic function panel, Lipid panel, TSH, Uric acid, Sedimentation rate, Hemoglobin A1c, CK  Numbness and tingling in left hand - sound more like ulncer compression no radicular sx but could have cervical compression expectant managmentand fu - Plan: Basic metabolic panel, CBC with Differential, Hepatic function panel, Lipid panel, TSH, Uric acid, Sedimentation rate, Hemoglobin A1c  Medication management - Plan: Basic metabolic panel, CBC with Differential, Hepatic  function panel, Lipid panel, TSH, Uric acid, Sedimentation rate, Hemoglobin A1c  Elevated blood sugar - hx in past nad pos fam hx will check a1c today - Plan: Basic metabolic panel, CBC with Differential, Hepatic function panel, Lipid panel, TSH, Uric acid, Sedimentation rate, Hemoglobin A1c Counseled regarding healthy nutrition, exercise, sleep, injury prevention, calcium vit d and healthy weight .  Patient Care Team: Burnis Medin, MD as PCP - General  Patient Instructions   Yearly flu vaccine advised   Will notify you  of labs when available. The numbness seems like ulnar nerve issue could be pinched nerve in neck.  eill check for diabetes  If lab ok then yearly visit  Or  Fu oif  persistent or progressive   Healthy lifestyle includes : At least 150 minutes of exercise weeks  , weight at healthy levels, which is usually   BMI 19-25. Avoid trans fats and processed foods;  Increase fresh fruits and veges to 5 servings per day. And avoid sweet beverages including tea and juice. Mediterranean diet with olive oil and nuts have been noted to be heart and brain healthy . Avoid tobacco products . Limit  alcohol to  7 per week for women and 14 servings for men.  Get adequate sleep . Wear seat belts . Don't text and drive .       Standley Brooking. Rainen Vanrossum M.D.  Out of prevnar   Pre visit review using our clinic review tool, if applicable. No additional management support is needed unless otherwise documented below in the visit note.

## 2014-07-08 NOTE — Patient Instructions (Addendum)
  Yearly flu vaccine advised   Will notify you  of labs when available. The numbness seems like ulnar nerve issue could be pinched nerve in neck.  eill check for diabetes  If lab ok then yearly visit  Or  Fu oif  persistent or progressive   Healthy lifestyle includes : At least 150 minutes of exercise weeks  , weight at healthy levels, which is usually   BMI 19-25. Avoid trans fats and processed foods;  Increase fresh fruits and veges to 5 servings per day. And avoid sweet beverages including tea and juice. Mediterranean diet with olive oil and nuts have been noted to be heart and brain healthy . Avoid tobacco products . Limit  alcohol to  7 per week for women and 14 servings for men.  Get adequate sleep . Wear seat belts . Don't text and drive .

## 2014-07-09 DIAGNOSIS — R200 Anesthesia of skin: Secondary | ICD-10-CM | POA: Insufficient documentation

## 2014-07-09 DIAGNOSIS — R52 Pain, unspecified: Secondary | ICD-10-CM | POA: Insufficient documentation

## 2014-07-09 DIAGNOSIS — R202 Paresthesia of skin: Secondary | ICD-10-CM

## 2014-07-09 DIAGNOSIS — E782 Mixed hyperlipidemia: Secondary | ICD-10-CM | POA: Insufficient documentation

## 2014-07-09 DIAGNOSIS — R350 Frequency of micturition: Secondary | ICD-10-CM | POA: Insufficient documentation

## 2014-07-09 DIAGNOSIS — E785 Hyperlipidemia, unspecified: Secondary | ICD-10-CM | POA: Insufficient documentation

## 2014-07-09 DIAGNOSIS — I10 Essential (primary) hypertension: Secondary | ICD-10-CM | POA: Insufficient documentation

## 2014-07-10 ENCOUNTER — Telehealth: Payer: Self-pay | Admitting: Internal Medicine

## 2014-07-10 NOTE — Telephone Encounter (Signed)
emmi emailed °

## 2014-08-07 DIAGNOSIS — J018 Other acute sinusitis: Secondary | ICD-10-CM | POA: Diagnosis not present

## 2014-08-20 ENCOUNTER — Other Ambulatory Visit: Payer: Self-pay | Admitting: Internal Medicine

## 2014-08-21 NOTE — Telephone Encounter (Signed)
Sent to the pharmacy by e-scribe. 

## 2014-09-10 ENCOUNTER — Telehealth: Payer: Self-pay | Admitting: Internal Medicine

## 2014-09-10 NOTE — Telephone Encounter (Signed)
She said she think she had the flu shot and do not want to the chance of having a double dose

## 2014-09-10 NOTE — Telephone Encounter (Signed)
Noted  

## 2014-09-15 ENCOUNTER — Other Ambulatory Visit: Payer: Self-pay | Admitting: Internal Medicine

## 2014-09-15 NOTE — Telephone Encounter (Signed)
Sent to the pharmacy by e-scribe. 

## 2015-01-27 DIAGNOSIS — Z1231 Encounter for screening mammogram for malignant neoplasm of breast: Secondary | ICD-10-CM | POA: Diagnosis not present

## 2015-01-27 LAB — HM MAMMOGRAPHY

## 2015-01-28 ENCOUNTER — Encounter: Payer: Self-pay | Admitting: Family Medicine

## 2015-04-29 ENCOUNTER — Telehealth: Payer: Self-pay | Admitting: Family Medicine

## 2015-04-29 ENCOUNTER — Other Ambulatory Visit: Payer: Self-pay | Admitting: Family Medicine

## 2015-04-29 MED ORDER — NEBIVOLOL HCL 5 MG PO TABS: 5.0000 mg | ORAL_TABLET | Freq: Every day | ORAL | Status: DC

## 2015-04-29 NOTE — Telephone Encounter (Signed)
Pt is due for medicare wellness in Jan 2017.  Please help her to make that appointment. If she has lab work done prior to appointment than I will place orders. Thanks!

## 2015-04-29 NOTE — Telephone Encounter (Signed)
Medication sent to the pharmacy for 90 days.  Message sent to scheduling for medicare wellness exam.

## 2015-04-30 NOTE — Telephone Encounter (Signed)
Pt has been sch for feb 2017

## 2015-05-03 ENCOUNTER — Ambulatory Visit (INDEPENDENT_AMBULATORY_CARE_PROVIDER_SITE_OTHER): Payer: Medicare Other | Admitting: Internal Medicine

## 2015-05-03 ENCOUNTER — Encounter: Payer: Self-pay | Admitting: Internal Medicine

## 2015-05-03 VITALS — BP 130/86 | Temp 98.3°F | Ht 63.25 in | Wt 227.3 lb

## 2015-05-03 DIAGNOSIS — M1990 Unspecified osteoarthritis, unspecified site: Secondary | ICD-10-CM

## 2015-05-03 DIAGNOSIS — Z23 Encounter for immunization: Secondary | ICD-10-CM | POA: Diagnosis not present

## 2015-05-03 DIAGNOSIS — L819 Disorder of pigmentation, unspecified: Secondary | ICD-10-CM

## 2015-05-03 DIAGNOSIS — H9202 Otalgia, left ear: Secondary | ICD-10-CM

## 2015-05-03 DIAGNOSIS — R200 Anesthesia of skin: Secondary | ICD-10-CM

## 2015-05-03 DIAGNOSIS — R202 Paresthesia of skin: Secondary | ICD-10-CM

## 2015-05-03 NOTE — Patient Instructions (Signed)
Will plan referral   To skin surgery center  For the new lesion on the face .  Also rheumatologist   To decide if degenerative arthritis  Or could also have inflammatory arhtritis . ? Psoriatic .

## 2015-05-03 NOTE — Progress Notes (Signed)
Pre visit review using our clinic review tool, if applicable. No additional management support is needed unless otherwise documented below in the visit note.  Chief Complaint  Patient presents with  . Moles    HPI: Patient Christy Young  comes in today for SDA for  A few  new problems evaluation.   Area on scalp patches  rasied and then falt but has a new "mole "that has begun just over the last 3 months and was raised at the mid scalp line .  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 Should she  be evaluated .  For other   Shoulder .   S;line  And hands at times  Gets rash on elbows at times ? psoiriasis  ? Finger left numbness at times withoiut weakness   Some neck pain ? Could that have been a stroke  No associ [place]  woith this  May have ear infection left  [ain retro aurrocular no uri teeth area hurt some no fever st  Other change   ROS: See pertinent positives and negatives per HPI.  Past Medical History  Diagnosis Date  . Hyperlipidemia   . Hypertension     meds since age 56   . Palpitations     hospitalized 2005 felt from stress neg cards eval  . Gout, unspecified   . OBESITY   . ARTHRITIS   . Fatigue   . Nonspecific abnormal electrocardiogram (ECG) (EKG)     t wave non acute    . Hyperuricemia     Family History  Problem Relation Age of Onset  . Hypertension Mother   . Arthritis Mother   . Alcohol abuse Father     deceased  . Diabetes Father   . Lymphoma Sister     non hodgkins  . Heart attack Son 36    1999  . Lung cancer Brother     Social History   Social History  . Marital Status: Single    Spouse Name: N/A  . Number of Children: N/A  . Years of Education: N/A   Social History Main Topics  . Smoking status: Never Smoker   . Smokeless tobacco: None  . Alcohol Use: Yes  . Drug Use: No  . Sexual Activity: Not Asked   Other Topics Concern  . None   Social History Narrative   Occupation: retired Event organiser, 3 yrs of college   Bereaved parent   [place] in December 12, 2022    No pets   [id number]   Sis died of cancer lymphoma 35  and bro now with lung cancer and spread.died June 25, 2023.    Outpatient Prescriptions Prior to Visit  Medication Sig Dispense Refill  . allopurinol (ZYLOPRIM) 100 MG tablet TAKE 2 TABLETS (=200 MG)   DAILY 180 tablet 2  . nebivolol (BYSTOLIC) 5 MG tablet Take 1 tablet (5 mg total) by mouth daily. 90 tablet 0  . pravastatin (PRAVACHOL) 20 MG tablet TAKE 1 TABLET DAILY 90 tablet 1   No facility-administered medications prior to visit.     EXAM:  BP 130/86 mmHg  Temp(Src) 98.3 F (36.8 C) (Oral)  Ht 5' 3.25" (1.607 m)  Wt 227 lb 4.8 oz (103.103 kg)  BMI 39.92 kg/m2  Body mass index is 39.92 kg/(m^2).  GENERAL: vitals reviewed and listed above, alert, oriented, appears well hydrated and in no acute distress HEENT: atraumatic, conjunctiva  clear, no obvious abnormalities on inspection of external nose  and ears ltmx nl neg tmj p[ain  por pinna tragal pain   OP : no lesion edema or exudate  Partials dentures  Left  Neck pain no midline  NECK: no obvious masses on inspection palpation   CV: HRRR, no clubbing cyanosis or  peripheral edema nl cap refill  MS: moves all extremities without noticeable focal  Abnormality no gross deformity of hands  Neuro grip ok pulses ok no atrophy left hand  PSYCH: pleasant and cooperative, no obvious depression or anxiety  ASSESSMENT AND PLAN:  Discussed the following assessment and plan:  Pigmented skin lesion of uncertain nature - new onset  onforehead scalp line - Plan: Ambulatory referral to Dermatology  Arthritis - prob djd but has some sx  evcluation fopr inflammaotry arthitis  has hx of skin areas cw psoriasis anbd fam hx of deforming arthritis  - Plan: Ambulatory referral to Rheumatology  Ear pain, left - prob referred pain  Numbness and tingling in left hand - unlar distribution poss radicular from neck  - Plan: Ambulatory referral to  Rheumatology  Need for prophylactic vaccination and inoculation against influenza - Plan: Flu vaccine HIGH DOSE PF (Fluzone High dose)  -Patient advised to return or notify health care team  if symptoms worsen ,persist or new concerns arise.  Patient Instructions  Will plan referral   To skin surgery center  For the new lesion on the face .  Also rheumatologist   To decide if degenerative arthritis  Or could also have inflammatory arhtritis . ? Psoriatic .         Standley Brooking. [name] M.D.

## 2015-05-26 DIAGNOSIS — M5412 Radiculopathy, cervical region: Secondary | ICD-10-CM | POA: Diagnosis not present

## 2015-05-26 DIAGNOSIS — M542 Cervicalgia: Secondary | ICD-10-CM | POA: Diagnosis not present

## 2015-05-26 DIAGNOSIS — M47816 Spondylosis without myelopathy or radiculopathy, lumbar region: Secondary | ICD-10-CM | POA: Diagnosis not present

## 2015-05-26 DIAGNOSIS — M47814 Spondylosis without myelopathy or radiculopathy, thoracic region: Secondary | ICD-10-CM | POA: Diagnosis not present

## 2015-06-02 DIAGNOSIS — M4316 Spondylolisthesis, lumbar region: Secondary | ICD-10-CM | POA: Diagnosis not present

## 2015-06-02 DIAGNOSIS — I10 Essential (primary) hypertension: Secondary | ICD-10-CM | POA: Diagnosis not present

## 2015-06-02 DIAGNOSIS — G5622 Lesion of ulnar nerve, left upper limb: Secondary | ICD-10-CM | POA: Diagnosis not present

## 2015-06-02 DIAGNOSIS — M47812 Spondylosis without myelopathy or radiculopathy, cervical region: Secondary | ICD-10-CM | POA: Diagnosis not present

## 2015-06-02 DIAGNOSIS — Z6838 Body mass index (BMI) 38.0-38.9, adult: Secondary | ICD-10-CM | POA: Diagnosis not present

## 2015-06-08 DIAGNOSIS — M5412 Radiculopathy, cervical region: Secondary | ICD-10-CM | POA: Diagnosis not present

## 2015-06-08 DIAGNOSIS — L4050 Arthropathic psoriasis, unspecified: Secondary | ICD-10-CM | POA: Diagnosis not present

## 2015-06-08 DIAGNOSIS — M2550 Pain in unspecified joint: Secondary | ICD-10-CM | POA: Diagnosis not present

## 2015-06-08 DIAGNOSIS — M5136 Other intervertebral disc degeneration, lumbar region: Secondary | ICD-10-CM | POA: Diagnosis not present

## 2015-06-11 DIAGNOSIS — L814 Other melanin hyperpigmentation: Secondary | ICD-10-CM | POA: Diagnosis not present

## 2015-06-11 DIAGNOSIS — D225 Melanocytic nevi of trunk: Secondary | ICD-10-CM | POA: Diagnosis not present

## 2015-06-11 DIAGNOSIS — L821 Other seborrheic keratosis: Secondary | ICD-10-CM | POA: Diagnosis not present

## 2015-08-04 ENCOUNTER — Ambulatory Visit (INDEPENDENT_AMBULATORY_CARE_PROVIDER_SITE_OTHER): Payer: Medicare Other | Admitting: Internal Medicine

## 2015-08-04 ENCOUNTER — Encounter: Payer: Self-pay | Admitting: Internal Medicine

## 2015-08-04 VITALS — BP 124/80 | Temp 98.0°F | Ht 63.0 in | Wt 226.5 lb

## 2015-08-04 DIAGNOSIS — Z0000 Encounter for general adult medical examination without abnormal findings: Secondary | ICD-10-CM | POA: Diagnosis not present

## 2015-08-04 DIAGNOSIS — E785 Hyperlipidemia, unspecified: Secondary | ICD-10-CM | POA: Diagnosis not present

## 2015-08-04 DIAGNOSIS — Z1159 Encounter for screening for other viral diseases: Secondary | ICD-10-CM | POA: Diagnosis not present

## 2015-08-04 DIAGNOSIS — J019 Acute sinusitis, unspecified: Secondary | ICD-10-CM

## 2015-08-04 DIAGNOSIS — Z79899 Other long term (current) drug therapy: Secondary | ICD-10-CM

## 2015-08-04 DIAGNOSIS — Z8639 Personal history of other endocrine, nutritional and metabolic disease: Secondary | ICD-10-CM

## 2015-08-04 DIAGNOSIS — R7309 Other abnormal glucose: Secondary | ICD-10-CM

## 2015-08-04 DIAGNOSIS — E669 Obesity, unspecified: Secondary | ICD-10-CM

## 2015-08-04 DIAGNOSIS — R739 Hyperglycemia, unspecified: Secondary | ICD-10-CM

## 2015-08-04 DIAGNOSIS — M1000 Idiopathic gout, unspecified site: Secondary | ICD-10-CM

## 2015-08-04 DIAGNOSIS — E790 Hyperuricemia without signs of inflammatory arthritis and tophaceous disease: Secondary | ICD-10-CM

## 2015-08-04 DIAGNOSIS — Z23 Encounter for immunization: Secondary | ICD-10-CM | POA: Diagnosis not present

## 2015-08-04 DIAGNOSIS — I10 Essential (primary) hypertension: Secondary | ICD-10-CM

## 2015-08-04 DIAGNOSIS — Z8739 Personal history of other diseases of the musculoskeletal system and connective tissue: Secondary | ICD-10-CM

## 2015-08-04 LAB — BASIC METABOLIC PANEL
BUN: 10 mg/dL (ref 6–23)
CALCIUM: 9.9 mg/dL (ref 8.4–10.5)
CO2: 27 mEq/L (ref 19–32)
CREATININE: 0.71 mg/dL (ref 0.40–1.20)
Chloride: 106 mEq/L (ref 96–112)
GFR: 86.55 mL/min (ref >60.00)
Glucose, Bld: 99 mg/dL (ref 70–99)
Potassium: 3.6 mEq/L (ref 3.5–5.1)
SODIUM: 141 meq/L (ref 135–145)

## 2015-08-04 LAB — CBC WITH DIFFERENTIAL/PLATELET
BASOS PCT: 0.5 % (ref 0.0–3.0)
Basophils Absolute: 0 10*3/uL (ref 0.0–0.1)
EOS PCT: 2.8 % (ref 0.0–5.0)
Eosinophils Absolute: 0.2 10*3/uL (ref 0.0–0.7)
HEMATOCRIT: 40.6 % (ref 36.0–46.0)
Hemoglobin: 13.4 g/dL (ref 12.0–15.0)
Lymphocytes Relative: 39.5 % (ref 12.0–46.0)
Lymphs Abs: 2.9 10*3/uL (ref 0.7–4.0)
MCHC: 32.9 g/dL (ref 30.0–36.0)
MCV: 89.7 fl (ref 78.0–100.0)
MONO ABS: 0.5 10*3/uL (ref 0.1–1.0)
Monocytes Relative: 6.7 % (ref 3.0–12.0)
Neutro Abs: 3.7 10*3/uL (ref 1.4–7.7)
Neutrophils Relative %: 50.5 % (ref 43.0–77.0)
PLATELETS: 265 10*3/uL (ref 150.0–400.0)
RBC: 4.53 Mil/uL (ref 3.87–5.11)
RDW: 13.9 % (ref 11.5–15.5)
WBC: 7.3 10*3/uL (ref 4.0–10.5)

## 2015-08-04 LAB — LIPID PANEL
CHOL/HDL RATIO: 4
Cholesterol: 247 mg/dL — ABNORMAL HIGH (ref 0–200)
HDL: 56.1 mg/dL (ref >39.00)
LDL CALC: 169 mg/dL — AB (ref 0–99)
NonHDL: 191.31
TRIGLYCERIDES: 114 mg/dL (ref 0.0–149.0)
VLDL: 22.8 mg/dL (ref 0.0–40.0)

## 2015-08-04 LAB — HEPATIC FUNCTION PANEL
ALT: 23 U/L (ref 0–35)
AST: 16 U/L (ref 0–37)
Albumin: 4 g/dL (ref 3.5–5.2)
Alkaline Phosphatase: 48 U/L (ref 39–117)
BILIRUBIN DIRECT: 0.1 mg/dL (ref 0.0–0.3)
BILIRUBIN TOTAL: 0.8 mg/dL (ref 0.2–1.2)
Total Protein: 6.8 g/dL (ref 6.0–8.3)

## 2015-08-04 LAB — URIC ACID: URIC ACID, SERUM: 5 mg/dL (ref 2.4–7.0)

## 2015-08-04 LAB — HEMOGLOBIN A1C: HEMOGLOBIN A1C: 5.8 % (ref 4.6–6.5)

## 2015-08-04 LAB — TSH: TSH: 1.81 u[IU]/mL (ref 0.35–4.50)

## 2015-08-04 MED ORDER — AMOXICILLIN-POT CLAVULANATE 875-125 MG PO TABS: 1.0000 | ORAL_TABLET | Freq: Two times a day (BID) | ORAL | Status: DC

## 2015-08-04 NOTE — Patient Instructions (Addendum)
Antibiotic  For sinusitis  But use saline and flonase also .  Consider weight watchers for weight control healthy eating.  Will notify you  of labs when available.  If cholesterol very high there are other meds beside statins meds  But lifes style  intervention is the best . \ ROV in 1 year depending on labs  Check into shingles vaccine ( Zostavax) reimbursement or cost to you  and can return at any time if call ahead for injection.   Health Maintenance, Female Adopting a healthy lifestyle and getting preventive care can go a long way to promote health and wellness. Talk with your health care provider about what schedule of regular examinations is right for you. This is a good chance for you to check in with your provider about disease prevention and staying healthy. In between checkups, there are plenty of things you can do on your own. Experts have done a lot of research about which lifestyle changes and preventive measures are most likely to keep you healthy. Ask your health care provider for more information. WEIGHT AND DIET  Eat a healthy diet  Be sure to include plenty of vegetables, fruits, low-fat dairy products, and lean protein.  Do not eat a lot of foods high in solid fats, added sugars, or salt.  Get regular exercise. This is one of the most important things you can do for your health.  Most adults should exercise for at least 150 minutes each week. The exercise should increase your heart rate and make you sweat (moderate-intensity exercise).  Most adults should also do strengthening exercises at least twice a week. This is in addition to the moderate-intensity exercise.  Maintain a healthy weight  Body mass index (BMI) is a measurement that can be used to identify possible weight problems. It estimates body fat based on height and weight. Your health care provider can help determine your BMI and help you achieve or maintain a healthy weight.  For females 60 years of age  and older:   A BMI below 18.5 is considered underweight.  A BMI of 18.5 to 24.9 is normal.  A BMI of 25 to 29.9 is considered overweight.  A BMI of 30 and above is considered obese.  Watch levels of cholesterol and blood lipids  You should start having your blood tested for lipids and cholesterol at 70 years of age, then have this test every 5 years.  You may need to have your cholesterol levels checked more often if:  Your lipid or cholesterol levels are high.  You are older than 70 years of age.  You are at high risk for heart disease.  CANCER SCREENING   Lung Cancer  Lung cancer screening is recommended for adults 55-35 years old who are at high risk for lung cancer because of a history of smoking.  A yearly low-dose CT scan of the lungs is recommended for people who:  Currently smoke.  Have quit within the past 15 years.  Have at least a 30-pack-year history of smoking. A pack year is smoking an average of one pack of cigarettes a day for 1 year.  Yearly screening should continue until it has been 15 years since you quit.  Yearly screening should stop if you develop a health problem that would prevent you from having lung cancer treatment.  Breast Cancer  Practice breast self-awareness. This means understanding how your breasts normally appear and feel.  It also means doing regular breast self-exams. Let your  health care provider know about any changes, no matter how small.  If you are in your 20s or 30s, you should have a clinical breast exam (CBE) by a health care provider every 1-3 years as part of a regular health exam.  If you are 60 or older, have a CBE every year. Also consider having a breast X-ray (mammogram) every year.  If you have a family history of breast cancer, talk to your health care provider about genetic screening.  If you are at high risk for breast cancer, talk to your health care provider about having an MRI and a mammogram every  year.  Breast cancer gene (BRCA) assessment is recommended for women who have family members with BRCA-related cancers. BRCA-related cancers include:  Breast.  Ovarian.  Tubal.  Peritoneal cancers.  Results of the assessment will determine the need for genetic counseling and BRCA1 and BRCA2 testing. Cervical Cancer Your health care provider may recommend that you be screened regularly for cancer of the pelvic organs (ovaries, uterus, and vagina). This screening involves a pelvic examination, including checking for microscopic changes to the surface of your cervix (Pap test). You may be encouraged to have this screening done every 3 years, beginning at age 23.  For women ages 70-65, health care providers may recommend pelvic exams and Pap testing every 3 years, or they may recommend the Pap and pelvic exam, combined with testing for human papilloma virus (HPV), every 5 years. Some types of HPV increase your risk of cervical cancer. Testing for HPV may also be done on women of any age with unclear Pap test results.  Other health care providers may not recommend any screening for nonpregnant women who are considered low risk for pelvic cancer and who do not have symptoms. Ask your health care provider if a screening pelvic exam is right for you.  If you have had past treatment for cervical cancer or a condition that could lead to cancer, you need Pap tests and screening for cancer for at least 20 years after your treatment. If Pap tests have been discontinued, your risk factors (such as having a new sexual partner) need to be reassessed to determine if screening should resume. Some women have medical problems that increase the chance of getting cervical cancer. In these cases, your health care provider may recommend more frequent screening and Pap tests. Colorectal Cancer  This type of cancer can be detected and often prevented.  Routine colorectal cancer screening usually begins at 70 years of  age and continues through 70 years of age.  Your health care provider may recommend screening at an earlier age if you have risk factors for colon cancer.  Your health care provider may also recommend using home test kits to check for hidden blood in the stool.  A small camera at the end of a tube can be used to examine your colon directly (sigmoidoscopy or colonoscopy). This is done to check for the earliest forms of colorectal cancer.  Routine screening usually begins at age 42.  Direct examination of the colon should be repeated every 5-10 years through 70 years of age. However, you may need to be screened more often if early forms of precancerous polyps or small growths are found. Skin Cancer  Check your skin from head to toe regularly.  Tell your health care provider about any new moles or changes in moles, especially if there is a change in a mole's shape or color.  Also tell your health  care provider if you have a mole that is larger than the size of a pencil eraser.  Always use sunscreen. Apply sunscreen liberally and repeatedly throughout the day.  Protect yourself by wearing long sleeves, pants, a wide-brimmed hat, and sunglasses whenever you are outside. HEART DISEASE, DIABETES, AND HIGH BLOOD PRESSURE   High blood pressure causes heart disease and increases the risk of stroke. High blood pressure is more likely to develop in:  People who have blood pressure in the high end of the normal range (130-139/85-89 mm Hg).  People who are overweight or obese.  People who are African American.  If you are 58-71 years of age, have your blood pressure checked every 3-5 years. If you are 65 years of age or older, have your blood pressure checked every year. You should have your blood pressure measured twice--once when you are at a hospital or clinic, and once when you are not at a hospital or clinic. Record the average of the two measurements. To check your blood pressure when you are  not at a hospital or clinic, you can use:  An automated blood pressure machine at a pharmacy.  A home blood pressure monitor.  If you are between 20 years and 13 years old, ask your health care provider if you should take aspirin to prevent strokes.  Have regular diabetes screenings. This involves taking a blood sample to check your fasting blood sugar level.  If you are at a normal weight and have a low risk for diabetes, have this test once every three years after 70 years of age.  If you are overweight and have a high risk for diabetes, consider being tested at a younger age or more often. PREVENTING INFECTION  Hepatitis B  If you have a higher risk for hepatitis B, you should be screened for this virus. You are considered at high risk for hepatitis B if:  You were born in a country where hepatitis B is common. Ask your health care provider which countries are considered high risk.  Your parents were born in a high-risk country, and you have not been immunized against hepatitis B (hepatitis B vaccine).  You have HIV or AIDS.  You use needles to inject street drugs.  You live with someone who has hepatitis B.  You have had sex with someone who has hepatitis B.  You get hemodialysis treatment.  You take certain medicines for conditions, including cancer, organ transplantation, and autoimmune conditions. Hepatitis C  Blood testing is recommended for:  Everyone born from [birth year] through 1965.  Anyone with known risk factors for hepatitis C. Sexually transmitted infections (STIs)  You should be screened for sexually transmitted infections (STIs) including gonorrhea and chlamydia if:  You are sexually active and are younger than 70 years of age.  You are older than 70 years of age and your health care provider tells you that you are at risk for this type of infection.  Your sexual activity has changed since you were last screened and you are at an increased risk for  chlamydia or gonorrhea. Ask your health care provider if you are at risk.  If you do not have HIV, but are at risk, it may be recommended that you take a prescription medicine daily to prevent HIV infection. This is called pre-exposure prophylaxis (PrEP). You are considered at risk if:  You are sexually active and do not regularly use condoms or know the HIV status of your partner(s).  You take  drugs by injection.  You are sexually active with a partner who has HIV. Talk with your health care provider about whether you are at high risk of being infected with HIV. If you choose to begin PrEP, you should first be tested for HIV. You should then be tested every 3 months for as long as you are taking PrEP.  PREGNANCY   If you are premenopausal and you may become pregnant, ask your health care provider about preconception counseling.  If you may become pregnant, take 400 to 800 micrograms (mcg) of folic acid every day.  If you want to prevent pregnancy, talk to your health care provider about birth control (contraception). OSTEOPOROSIS AND MENOPAUSE   Osteoporosis is a disease in which the bones lose minerals and strength with aging. This can result in serious bone fractures. Your risk for osteoporosis can be identified using a bone density scan.  If you are 43 years of age or older, or if you are at risk for osteoporosis and fractures, ask your health care provider if you should be screened.  Ask your health care provider whether you should take a calcium or vitamin D supplement to lower your risk for osteoporosis.  Menopause may have certain physical symptoms and risks.  Hormone replacement therapy may reduce some of these symptoms and risks. Talk to your health care provider about whether hormone replacement therapy is right for you.  HOME CARE INSTRUCTIONS   Schedule regular health, dental, and eye exams.  Stay current with your immunizations.   Do not use any tobacco products  including cigarettes, chewing tobacco, or electronic cigarettes.  If you are pregnant, do not drink alcohol.  If you are breastfeeding, limit how much and how often you drink alcohol.  Limit alcohol intake to no more than 1 drink per day for nonpregnant women. One drink equals 12 ounces of beer, 5 ounces of wine, or 1 ounces of hard liquor.  Do not use street drugs.  Do not share needles.  Ask your health care provider for help if you need support or information about quitting drugs.  Tell your health care provider if you often feel depressed.  Tell your health care provider if you have ever been abused or do not feel safe at home.   This information is not intended to replace advice given to you by your health care provider. Make sure you discuss any questions you have with your health care provider.   Document Released: 12/26/2010 Document Revised: 07/03/2014 Document Reviewed: 05/14/2013 [employer] Interactive Patient Education [employer].

## 2015-08-04 NOTE — Progress Notes (Signed)
Pre visit review using our clinic review tool, if applicable. No additional management support is needed unless otherwise documented below in the visit note.  Chief Complaint  Patient presents with  . Medicare Wellness  . Sinus Problem  . Hyperlipidemia    had se of med and stopped     HPI: Christy Young 70 y.o. comes in today for Preventive Medicare wellness visit .Since last visit. And disease management   Wants to lose weight an do better  ...   Lipid:taking statin .   prava stopped as MS sx got better off med     Has seen specialist and was to fu ( no note in record so far ) to ge uric acid [facility] and other for back and ms pain dr Dossie Der pain specialist   I have a sinus infection like in the past   Co Choking.   Face pressure  Ears clogged .  Mucous  Is   No blood  yesllow off and on .  Getting  Some antibiotic    Head   Cold  Not getting better o going this week.   Candace Cruise to  [place]   soe onset   Had facial trauma shot  In to face and sinus  Right side .   Saw derm  No cancer   Taking allopruinol or  Gout suppression  . No se of med  bp has been ok at other offices  Health Maintenance  Topic Date Due  . ZOSTAVAX  10/23/2005  . DEXA SCAN  10/24/2010  . INFLUENZA VACCINE  01/25/2016  . MAMMOGRAM  01/26/2017  . TETANUS/TDAP  03/27/2019  . COLONOSCOPY  02/14/2024  . Hepatitis C Screening  Completed  . PNA vac Low Risk Adult  Completed   Health Maintenance Review LIFESTYLE:  TAD ocass  Sugar beverages: no  Sleep:  When not sick   About  5-6 hours .     MEDICARE DOCUMENT QUESTIONS  TO SCAN   Hearing: ok  Vision:  No limitations at present . Last eye check UTD  Safety:  Has smoke detector and wears seat belts.  No firearms. No excess sun exposure. Sees dentist regularly.  Falls: n x trip  Advance directive :  Reviewed  Has one.  Memory: Felt to be good  , no concern from her or her family.  Depression: No anhedonia unusual crying or depressive  symptoms  Nutrition: Eats well balanced diet; adequate calcium and vitamin D. No swallowing chewing problems.  Injury: no major injuries in the last six months.  Other healthcare providers:  Reviewed today .  Social:  Lives alone. No pets.   Preventive parameters: up-to-date  Reviewed   ADLS:   There are no problems or need for assistance  driving, feeding, obtaining food, dressing, toileting and bathing, managing money using phone. She is independent.    ROS:  As above  GEN/ HEENT: No fever, significant weight changes sweats headaches vision problems hearing changes, CV/ PULM; No chest pain shortness of breath cough, syncope,edema  change in exercise tolerance. GI /GU: No adominal pain, vomiting, change in bowel habits. No blood in the stool. No significant GU symptoms. SKIN/HEME: ,no acute skin rashes suspicious lesions or bleeding. No lymphadenopathy, nodules, masses.  NEURO/ PSYCH:  No neurologic signs such as weakness numbness. No depression anxiety. IMM/ Allergy: No unusual infections.  Allergy .  yes REST of 12 system review negative except as per HPI   Past Medical  History  Diagnosis Date  . Hyperlipidemia   . Hypertension     meds since age 89   . Palpitations     hospitalized 2005 felt from stress neg cards eval  . Gout, unspecified   . OBESITY   . ARTHRITIS   . Fatigue   . Nonspecific abnormal electrocardiogram (ECG) (EKG)     t wave non acute    . Hyperuricemia     Family History  Problem Relation Age of Onset  . Hypertension Mother   . Arthritis Mother   . Alcohol abuse Father     deceased  . Diabetes Father   . Lymphoma Sister     non hodgkins  . Heart attack Son 60    1999  . Lung cancer Brother     Social History   Social History  . Marital Status: Single    Spouse Name: N/A  . Number of Children: N/A  . Years of Education: N/A   Social History Main Topics  . Smoking status: Never Smoker   . Smokeless tobacco: None  . Alcohol Use: Yes   . Drug Use: No  . Sexual Activity: Not Asked   Other Topics Concern  . None   Social History Narrative   Occupation: retired Event organiser, 3 yrs of college   Bereaved parent   [place] in 11-30-2022    No pets   [id number]   Sis died of cancer lymphoma 12  and bro now with lung cancer and spread.died 06/12/2023.    Outpatient Encounter Prescriptions as of 08/04/2015  Medication Sig  . allopurinol (ZYLOPRIM) 100 MG tablet TAKE 2 TABLETS (=200 MG)   DAILY  . [DISCONTINUED] nebivolol (BYSTOLIC) 5 MG tablet Take 1 tablet (5 mg total) by mouth daily.  Marland Kitchen amoxicillin-clavulanate (AUGMENTIN) 875-125 MG tablet Take 1 tablet by mouth 2 (two) times daily.  . pravastatin (PRAVACHOL) 20 MG tablet TAKE 1 TABLET DAILY (Patient not taking: Reported on 08/04/2015)   No facility-administered encounter medications on file as of 08/04/2015.    EXAM:  BP 124/80 mmHg  Temp(Src) 98 F (36.7 C) (Oral)  Ht '5\' 3"'  (1.6 m)  Wt 226 lb 8 oz (102.74 kg)  BMI 40.13 kg/m2  Body mass index is 40.13 kg/(m^2).  Physical Exam: Vital signs reviewed XNA:TFTD is a well-developed well-nourished alert cooperative   who appears stated age in no acute distress. Very congested  Non toxic  HEENT: normocephalic atraumatic , Eyes: PERRL EOM's full, conjunctiva clear, Nares: paten,tcongested  Face mild tenderness right face  Ears: no deformity EAC's clear TMs with normal landmarks. Mouth: clear OP, no lesions, edema.  Moist mucous membranes. Dentition in adequate repair. NECK: supple without masses, thyromegaly or bruits. CHEST/PULM:  Clear to auscultation and percussion breath sounds equal no wheeze , rales or rhonchi. .Breast: normal by inspection . No dimpling, discharge, masses, tenderness or discharge . CV: PMI is nondisplaced, S1 S2 no gallops, murmurs, rubs. Peripheral pulses are full without delay.No JVD .  ABDOMEN: Bowel sounds normal nontender  No guard or rebound, no hepato splenomegal no CVA tenderness.    Extremtities:  No clubbing cyanosis or edema, no acute joint swelling or redness no focal atrophy NEURO:  Oriented x3, cranial nerves 3-12 appear to be intact, no obvious focal weakness,gait within normal limits  SKIN: No acute rashes normal turgor, color, no bruising or petechiae. PSYCH: Oriented, good eye contact, no obvious depression anxiety, cognition and judgment appear normal. LN: no  cervical axillary inguinal adenopathy No noted deficits in memory, attention, and speech.    BP Readings from Last 3 Encounters:  08/04/15 124/80  05/03/15 130/86  07/08/14 132/80     ASSESSMENT AND PLAN:  Discussed the following assessment and plan:  Medicare annual wellness visit, subsequent  Essential hypertension - Plan: Basic metabolic panel, Hemoglobin A1c, Hepatic function panel, Lipid panel, TSH, CBC with Differential/Platelet, Uric Acid  Hyperuricemia - Plan: Basic metabolic panel, Hemoglobin A1c, Hepatic function panel, Lipid panel, TSH, CBC with Differential/Platelet, Uric Acid  Obesity (BMI 30-39.9)  Idiopathic gout, unspecified chronicity, unspecified site  Elevated blood sugar - Plan: Basic metabolic panel, Hemoglobin A1c  Hyperlipidemia - se statin prava repeat  ;si consoder other such as zetia - Plan: Hepatic function panel, Lipid panel  H/O: gout - Plan: Uric Acid  Medication management  Acute sinusitis with symptoms greater than 10 days - at risk hx of sinus trauma  disc optinos med expectant managment  avoiding quinolones for now although she has had in past  Need for hepatitis C screening test - Plan: Hepatitis C antibody  Need for vaccination with 13-polyvalent pneumococcal conjugate vaccine - Plan: Pneumococcal conjugate vaccine 13-valent Due for labs an monitoring  For above   labs and a1c uric acid  Patient Care Team: Burnis Medin, MD as PCP - General  Patient Instructions  Antibiotic  For sinusitis  But use saline and flonase also .  Consider weight  watchers for weight control healthy eating.  Will notify you  of labs when available.  If cholesterol very high there are other meds beside statins meds  But lifes style  intervention is the best . \ ROV in 1 year depending on labs  Check into shingles vaccine ( Zostavax) reimbursement or cost to you  and can return at any time if call ahead for injection.   Health Maintenance, Female Adopting a healthy lifestyle and getting preventive care can go a long way to promote health and wellness. Talk with your health care provider about what schedule of regular examinations is right for you. This is a good chance for you to check in with your provider about disease prevention and staying healthy. In between checkups, there are plenty of things you can do on your own. Experts have done a lot of research about which lifestyle changes and preventive measures are most likely to keep you healthy. Ask your health care provider for more information. WEIGHT AND DIET  Eat a healthy diet  Be sure to include plenty of vegetables, fruits, low-fat dairy products, and lean protein.  Do not eat a lot of foods high in solid fats, added sugars, or salt.  Get regular exercise. This is one of the most important things you can do for your health.  Most adults should exercise for at least 150 minutes each week. The exercise should increase your heart rate and make you sweat (moderate-intensity exercise).  Most adults should also do strengthening exercises at least twice a week. This is in addition to the moderate-intensity exercise.  Maintain a healthy weight  Body mass index (BMI) is a measurement that can be used to identify possible weight problems. It estimates body fat based on height and weight. Your health care provider can help determine your BMI and help you achieve or maintain a healthy weight.  For females 4 years of age and older:   A BMI below 18.5 is considered underweight.  A BMI of 18.5 to  24.9 is  normal.  A BMI of 25 to 29.9 is considered overweight.  A BMI of 30 and above is considered obese.  Watch levels of cholesterol and blood lipids  You should start having your blood tested for lipids and cholesterol at 70 years of age, then have this test every 5 years.  You may need to have your cholesterol levels checked more often if:  Your lipid or cholesterol levels are high.  You are older than 70 years of age.  You are at high risk for heart disease.  CANCER SCREENING   Lung Cancer  Lung cancer screening is recommended for adults 27-59 years old who are at high risk for lung cancer because of a history of smoking.  A yearly low-dose CT scan of the lungs is recommended for people who:  Currently smoke.  Have quit within the past 15 years.  Have at least a 30-pack-year history of smoking. A pack year is smoking an average of one pack of cigarettes a day for 1 year.  Yearly screening should continue until it has been 15 years since you quit.  Yearly screening should stop if you develop a health problem that would prevent you from having lung cancer treatment.  Breast Cancer  Practice breast self-awareness. This means understanding how your breasts normally appear and feel.  It also means doing regular breast self-exams. Let your health care provider know about any changes, no matter how small.  If you are in your 20s or 30s, you should have a clinical breast exam (CBE) by a health care provider every 1-3 years as part of a regular health exam.  If you are 20 or older, have a CBE every year. Also consider having a breast X-ray (mammogram) every year.  If you have a family history of breast cancer, talk to your health care provider about genetic screening.  If you are at high risk for breast cancer, talk to your health care provider about having an MRI and a mammogram every year.  Breast cancer gene (BRCA) assessment is recommended for women who have family  members with BRCA-related cancers. BRCA-related cancers include:  Breast.  Ovarian.  Tubal.  Peritoneal cancers.  Results of the assessment will determine the need for genetic counseling and BRCA1 and BRCA2 testing. Cervical Cancer Your health care provider may recommend that you be screened regularly for cancer of the pelvic organs (ovaries, uterus, and vagina). This screening involves a pelvic examination, including checking for microscopic changes to the surface of your cervix (Pap test). You may be encouraged to have this screening done every 3 years, beginning at age 65.  For women ages 42-65, health care providers may recommend pelvic exams and Pap testing every 3 years, or they may recommend the Pap and pelvic exam, combined with testing for human papilloma virus (HPV), every 5 years. Some types of HPV increase your risk of cervical cancer. Testing for HPV may also be done on women of any age with unclear Pap test results.  Other health care providers may not recommend any screening for nonpregnant women who are considered low risk for pelvic cancer and who do not have symptoms. Ask your health care provider if a screening pelvic exam is right for you.  If you have had past treatment for cervical cancer or a condition that could lead to cancer, you need Pap tests and screening for cancer for at least 20 years after your treatment. If Pap tests have been discontinued, your risk factors (such as  having a new sexual partner) need to be reassessed to determine if screening should resume. Some women have medical problems that increase the chance of getting cervical cancer. In these cases, your health care provider may recommend more frequent screening and Pap tests. Colorectal Cancer  This type of cancer can be detected and often prevented.  Routine colorectal cancer screening usually begins at 70 years of age and continues through 70 years of age.  Your health care provider may recommend  screening at an earlier age if you have risk factors for colon cancer.  Your health care provider may also recommend using home test kits to check for hidden blood in the stool.  A small camera at the end of a tube can be used to examine your colon directly (sigmoidoscopy or colonoscopy). This is done to check for the earliest forms of colorectal cancer.  Routine screening usually begins at age 47.  Direct examination of the colon should be repeated every 5-10 years through 71 years of age. However, you may need to be screened more often if early forms of precancerous polyps or small growths are found. Skin Cancer  Check your skin from head to toe regularly.  Tell your health care provider about any new moles or changes in moles, especially if there is a change in a mole's shape or color.  Also tell your health care provider if you have a mole that is larger than the size of a pencil eraser.  Always use sunscreen. Apply sunscreen liberally and repeatedly throughout the day.  Protect yourself by wearing long sleeves, pants, a wide-brimmed hat, and sunglasses whenever you are outside. HEART DISEASE, DIABETES, AND HIGH BLOOD PRESSURE   High blood pressure causes heart disease and increases the risk of stroke. High blood pressure is more likely to develop in:  People who have blood pressure in the high end of the normal range (130-139/85-89 mm Hg).  People who are overweight or obese.  People who are African American.  If you are 6-25 years of age, have your blood pressure checked every 3-5 years. If you are 10 years of age or older, have your blood pressure checked every year. You should have your blood pressure measured twice--once when you are at a hospital or clinic, and once when you are not at a hospital or clinic. Record the average of the two measurements. To check your blood pressure when you are not at a hospital or clinic, you can use:  An automated blood pressure machine at a  pharmacy.  A home blood pressure monitor.  If you are between 39 years and 21 years old, ask your health care provider if you should take aspirin to prevent strokes.  Have regular diabetes screenings. This involves taking a blood sample to check your fasting blood sugar level.  If you are at a normal weight and have a low risk for diabetes, have this test once every three years after 70 years of age.  If you are overweight and have a high risk for diabetes, consider being tested at a younger age or more often. PREVENTING INFECTION  Hepatitis B  If you have a higher risk for hepatitis B, you should be screened for this virus. You are considered at high risk for hepatitis B if:  You were born in a country where hepatitis B is common. Ask your health care provider which countries are considered high risk.  Your parents were born in a high-risk country, and you have not  been immunized against hepatitis B (hepatitis B vaccine).  You have HIV or AIDS.  You use needles to inject street drugs.  You live with someone who has hepatitis B.  You have had sex with someone who has hepatitis B.  You get hemodialysis treatment.  You take certain medicines for conditions, including cancer, organ transplantation, and autoimmune conditions. Hepatitis C  Blood testing is recommended for:  Everyone born from [birth year] through 1965.  Anyone with known risk factors for hepatitis C. Sexually transmitted infections (STIs)  You should be screened for sexually transmitted infections (STIs) including gonorrhea and chlamydia if:  You are sexually active and are younger than 70 years of age.  You are older than 70 years of age and your health care provider tells you that you are at risk for this type of infection.  Your sexual activity has changed since you were last screened and you are at an increased risk for chlamydia or gonorrhea. Ask your health care provider if you are at risk.  If you do not  have HIV, but are at risk, it may be recommended that you take a prescription medicine daily to prevent HIV infection. This is called pre-exposure prophylaxis (PrEP). You are considered at risk if:  You are sexually active and do not regularly use condoms or know the HIV status of your partner(s).  You take drugs by injection.  You are sexually active with a partner who has HIV. Talk with your health care provider about whether you are at high risk of being infected with HIV. If you choose to begin PrEP, you should first be tested for HIV. You should then be tested every 3 months for as long as you are taking PrEP.  PREGNANCY   If you are premenopausal and you may become pregnant, ask your health care provider about preconception counseling.  If you may become pregnant, take 400 to 800 micrograms (mcg) of folic acid every day.  If you want to prevent pregnancy, talk to your health care provider about birth control (contraception). OSTEOPOROSIS AND MENOPAUSE   Osteoporosis is a disease in which the bones lose minerals and strength with aging. This can result in serious bone fractures. Your risk for osteoporosis can be identified using a bone density scan.  If you are 45 years of age or older, or if you are at risk for osteoporosis and fractures, ask your health care provider if you should be screened.  Ask your health care provider whether you should take a calcium or vitamin D supplement to lower your risk for osteoporosis.  Menopause may have certain physical symptoms and risks.  Hormone replacement therapy may reduce some of these symptoms and risks. Talk to your health care provider about whether hormone replacement therapy is right for you.  HOME CARE INSTRUCTIONS   Schedule regular health, dental, and eye exams.  Stay current with your immunizations.   Do not use any tobacco products including cigarettes, chewing tobacco, or electronic cigarettes.  If you are pregnant, do not  drink alcohol.  If you are breastfeeding, limit how much and how often you drink alcohol.  Limit alcohol intake to no more than 1 drink per day for nonpregnant women. One drink equals 12 ounces of beer, 5 ounces of wine, or 1 ounces of hard liquor.  Do not use street drugs.  Do not share needles.  Ask your health care provider for help if you need support or information about quitting drugs.  Tell your health  care provider if you often feel depressed.  Tell your health care provider if you have ever been abused or do not feel safe at home.   This information is not intended to replace advice given to you by your health care provider. Make sure you discuss any questions you have with your health care provider.   Document Released: 12/26/2010 Document Revised: 07/03/2014 Document Reviewed: 05/14/2013 Elsevier Interactive Patient Education 2016 [place] K. Oliverio Cho M.D.

## 2015-08-05 ENCOUNTER — Other Ambulatory Visit: Payer: Self-pay | Admitting: Internal Medicine

## 2015-08-05 LAB — HEPATITIS C ANTIBODY: HCV AB: NEGATIVE

## 2015-08-05 NOTE — Telephone Encounter (Signed)
Sent to the pharmacy by e-scribe. 

## 2015-08-08 DIAGNOSIS — Z8739 Personal history of other diseases of the musculoskeletal system and connective tissue: Secondary | ICD-10-CM | POA: Insufficient documentation

## 2015-08-08 DIAGNOSIS — R739 Hyperglycemia, unspecified: Secondary | ICD-10-CM | POA: Insufficient documentation

## 2015-08-09 ENCOUNTER — Encounter: Payer: Medicare Other | Admitting: Neurology

## 2015-08-10 ENCOUNTER — Encounter: Payer: Self-pay | Admitting: Internal Medicine

## 2015-08-23 ENCOUNTER — Ambulatory Visit (INDEPENDENT_AMBULATORY_CARE_PROVIDER_SITE_OTHER): Payer: Self-pay | Admitting: Neurology

## 2015-08-23 ENCOUNTER — Ambulatory Visit (INDEPENDENT_AMBULATORY_CARE_PROVIDER_SITE_OTHER): Payer: Medicare Other | Admitting: Neurology

## 2015-08-23 ENCOUNTER — Encounter: Payer: Self-pay | Admitting: Neurology

## 2015-08-23 DIAGNOSIS — G5622 Lesion of ulnar nerve, left upper limb: Secondary | ICD-10-CM

## 2015-08-23 DIAGNOSIS — G5600 Carpal tunnel syndrome, unspecified upper limb: Secondary | ICD-10-CM

## 2015-08-23 DIAGNOSIS — R5382 Chronic fatigue, unspecified: Secondary | ICD-10-CM

## 2015-08-23 DIAGNOSIS — G5602 Carpal tunnel syndrome, left upper limb: Secondary | ICD-10-CM | POA: Diagnosis not present

## 2015-08-23 HISTORY — DX: Lesion of ulnar nerve, left upper limb: G56.22

## 2015-08-23 HISTORY — DX: Carpal tunnel syndrome, unspecified upper limb: G56.00

## 2015-08-23 NOTE — Procedures (Signed)
     HISTORY:  Christy Young is a 70 year old patient with a six-month history of some numbness in the left hand with discomfort going up the arm into the neck. The patient has had some improvement in her discomfort after coming off a cholesterol medication. She is being evaluated for possible neuropathy or a cervical radiculopathy.  NERVE CONDUCTION STUDIES:  Nerve conduction studies were performed on both upper extremities. The distal motor latencies for the median nerves were prolonged on the left, normal on the right, and normal for the ulnar nerves bilaterally. The motor amplitudes for these nerves were normal bilaterally. The F wave latencies and nerve conduction velocities for the median and ulnar nerves were normal bilaterally. The sensory latencies for the median nerves were normal on the right, prolonged on the left, and normal for the ulnar nerves bilaterally.  EMG STUDIES:  EMG study was performed on the left upper extremity:  The first dorsal interosseous muscle reveals 2 to 5 K units with decreased recruitment. No fibrillations or positive waves were noted. The abductor pollicis brevis muscle reveals 2 to 4 K units with full recruitment. No fibrillations or positive waves were noted. The extensor indicis proprius muscle reveals 1 to 3 K units with full recruitment. No fibrillations or positive waves were noted. The pronator teres muscle reveals 2 to 3 K units with full recruitment. No fibrillations or positive waves were noted. The flexor carpi ulnaris muscle reveals 2 to 5 K units with full recruitment. No fibrillations or positive waves were seen. The biceps muscle reveals 1 to 2 K units with full recruitment. No fibrillations or positive waves were noted. The triceps muscle reveals 2 to 4 K units with full recruitment. No fibrillations or positive waves were noted. The anterior deltoid muscle reveals 2 to 3 K units with full recruitment. No fibrillations or positive waves were  noted. The cervical paraspinal muscles were tested at 2 levels. No abnormalities of insertional activity were seen at either level tested. There was good relaxation.   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 There is no evidence of an overlying cervical radiculopathy.  Jill Alexanders MD 08/23/2015 11:01 AM  [facility] [address] [place], [place] [address]  Phone [phone] Fax [phone]

## 2015-08-23 NOTE — Progress Notes (Signed)
Please refer to EMG and nerve conduction study procedure note. 

## 2015-09-06 DIAGNOSIS — L4050 Arthropathic psoriasis, unspecified: Secondary | ICD-10-CM | POA: Diagnosis not present

## 2015-09-06 DIAGNOSIS — M5136 Other intervertebral disc degeneration, lumbar region: Secondary | ICD-10-CM | POA: Diagnosis not present

## 2015-09-06 DIAGNOSIS — M2550 Pain in unspecified joint: Secondary | ICD-10-CM | POA: Diagnosis not present

## 2015-09-06 DIAGNOSIS — M1A09X0 Idiopathic chronic gout, multiple sites, without tophus (tophi): Secondary | ICD-10-CM | POA: Diagnosis not present

## 2015-12-25 ENCOUNTER — Other Ambulatory Visit: Payer: Self-pay | Admitting: Internal Medicine

## 2015-12-27 NOTE — Telephone Encounter (Signed)
Sent to the pharmacy by e-scribe. 

## 2016-02-09 NOTE — Progress Notes (Signed)
Pre visit review using our clinic review tool, if applicable. No additional management support is needed unless otherwise documented below in the visit note.  Chief Complaint  Patient presents with  . Follow-up    HPI: Christy Young 70 y.o.   Fu trial of zetia for lipids  Wanted to try otc niacin  But just did protino control  See last notes  Sinus always an issue but ok   eating better  veges exercise  .  Walking more .  less food .   Feels well bp seems better  Not taking prava prefers no med   On allopruinol and ol  ROS: See pertinent positives and negatives per HPI.  Past Medical History:  Diagnosis Date  . ARTHRITIS   . Carpal tunnel syndrome 08/23/2015   Left  . Fatigue   . Gout, unspecified   . Hyperlipidemia   . Hypertension    meds since age 29   . Hyperuricemia   . Nonspecific abnormal electrocardiogram (ECG) (EKG)    t wave non acute    . OBESITY   . Palpitations    hospitalized 2005 felt from stress neg cards eval  . 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 Hypertension Mother   . Arthritis Mother   . Alcohol abuse Father     deceased  . Diabetes Father   . Lymphoma Sister     non hodgkins  . Heart attack Son 35    1999  . Lung cancer Brother     Social History   Social History  . Marital status: Single    Spouse name: N/A  . Number of children: N/A  . Years of education: N/A   Social History Main Topics  . Smoking status: Never Smoker  . Smokeless tobacco: None  . Alcohol use Yes  . Drug use: No  . Sexual activity: Not Asked   Other Topics Concern  . None   Social History Narrative   Occupation: retired Event organiser, 3 yrs of college   Bereaved parent   [place] in 2022-12-01    No pets   [id number]   Sis died of cancer lymphoma 41  and bro now with lung cancer and spread.died June 14, 2023.    Outpatient Medications Prior to Visit  Medication Sig Dispense Refill    . allopurinol (ZYLOPRIM) 100 MG tablet TAKE 2 TABLETS (=200 MG)   DAILY 180 tablet 1  . BYSTOLIC 5 MG tablet TAKE 1 TABLET DAILY 90 tablet 2  . amoxicillin-clavulanate (AUGMENTIN) 875-125 MG tablet Take 1 tablet by mouth 2 (two) times daily. 20 tablet 0  . pravastatin (PRAVACHOL) 20 MG tablet TAKE 1 TABLET DAILY (Patient not taking: Reported on 08/04/2015) 90 tablet 1   No facility-administered medications prior to visit.      EXAM:  BP 134/78 (BP Location: Right Arm, Patient Position: Sitting, Cuff Size: Large)   Temp 97.6 F (36.4 C) (Oral)   Ht 5\' 3"  (1.6 m)   Wt 216 lb 4.8 oz (98.1 kg)   BMI 38.32 kg/m   Body mass index is 38.32 kg/m.  GENERAL: vitals reviewed and listed above, alert, oriented, appears well hydrated and in no acute distress HEENT: atraumatic, conjunctiva  clear, no obvious abnormalities on inspection of external nose and ears  NECK: no obvious masses on inspection palpation  LUNGS: clear to auscultation bilaterally, no wheezes,  rales or rhonchi, good air movement CV: HRRR, no clubbing cyanosis or  peripheral edema nl cap refill  MS: moves all extremities without noticeable focal  abnormality PSYCH: pleasant and cooperative, no obvious depression or anxiety Lab Results  Component Value Date   WBC 7.3 08/04/2015   HGB 13.4 08/04/2015   HCT 40.6 08/04/2015   PLT 265.0 08/04/2015   GLUCOSE 99 08/04/2015   CHOL 247 (H) 08/04/2015   TRIG 114.0 08/04/2015   HDL 56.10 08/04/2015   LDLDIRECT 180.4 04/11/2013   LDLCALC 169 (H) 08/04/2015   ALT 23 08/04/2015   AST 16 08/04/2015   NA 141 08/04/2015   K 3.6 08/04/2015   CL 106 08/04/2015   CREATININE 0.71 08/04/2015   BUN 10 08/04/2015   CO2 27 08/04/2015   TSH 1.81 08/04/2015   HGBA1C 5.8 08/04/2015   MICROALBUR 1.4 12/01/2009   Wt Readings from Last 3 Encounters:  02/10/16 216 lb 4.8 oz (98.1 kg)  08/04/15 226 lb 8 oz (102.7 kg)  05/03/15 227 lb 4.8 oz (103.1 kg)    ASSESSMENT AND  PLAN:  Discussed the following assessment and plan:  Essential hypertension  Obesity (BMI 30-39.9)  Hyperlipidemia lsi  applauded and with loss with bp control  Shared Decision Making will rechecks lipids at yearly check  With uric acid a1c etc     -Patient advised to return or notify health care team  if symptoms worsen ,persist or new concerns arise. Total visit 46mins > 50% spent counseling and coordinating care as indicated in above note and in instructions to patient .     Patient Instructions   Continue lifestyle intervention healthy eating and exercise .   Wt Readings from Last 3 Encounters:  02/10/16 216 lb 4.8 oz (98.1 kg)  08/04/15 226 lb 8 oz (102.7 kg)  05/03/15 227 lb 4.8 oz (103.1 kg)   Ok to wait until  Until your yearly visit  In February and we can do labs  With uric acid  Can call ahead if you want to get labs ahead of times.   KEEP UP THE GOOD WORK. Standley Brooking. [name] M.D.

## 2016-02-10 ENCOUNTER — Ambulatory Visit (INDEPENDENT_AMBULATORY_CARE_PROVIDER_SITE_OTHER): Payer: Medicare Other | Admitting: Internal Medicine

## 2016-02-10 ENCOUNTER — Encounter: Payer: Self-pay | Admitting: Internal Medicine

## 2016-02-10 VITALS — BP 134/78 | Temp 97.6°F | Ht 63.0 in | Wt 216.3 lb

## 2016-02-10 DIAGNOSIS — E785 Hyperlipidemia, unspecified: Secondary | ICD-10-CM

## 2016-02-10 DIAGNOSIS — E669 Obesity, unspecified: Secondary | ICD-10-CM | POA: Diagnosis not present

## 2016-02-10 DIAGNOSIS — I10 Essential (primary) hypertension: Secondary | ICD-10-CM

## 2016-02-10 NOTE — Patient Instructions (Addendum)
Continue lifestyle intervention healthy eating and exercise .   Wt Readings from Last 3 Encounters:  02/10/16 216 lb 4.8 oz (98.1 kg)  08/04/15 226 lb 8 oz (102.7 kg)  05/03/15 227 lb 4.8 oz (103.1 kg)   Ok to wait until  Until your yearly visit  In February and we can do labs  With uric acid  Can call ahead if you want to get labs ahead of times.   KEEP UP THE GOOD WORK. Marland Kitchen

## 2016-02-16 DIAGNOSIS — J018 Other acute sinusitis: Secondary | ICD-10-CM | POA: Diagnosis not present

## 2016-02-16 DIAGNOSIS — J209 Acute bronchitis, unspecified: Secondary | ICD-10-CM | POA: Diagnosis not present

## 2016-02-16 DIAGNOSIS — R6889 Other general symptoms and signs: Secondary | ICD-10-CM | POA: Diagnosis not present

## 2016-02-16 DIAGNOSIS — R0602 Shortness of breath: Secondary | ICD-10-CM | POA: Diagnosis not present

## 2016-02-16 DIAGNOSIS — J029 Acute pharyngitis, unspecified: Secondary | ICD-10-CM | POA: Diagnosis not present

## 2016-03-20 DIAGNOSIS — Z1231 Encounter for screening mammogram for malignant neoplasm of breast: Secondary | ICD-10-CM | POA: Diagnosis not present

## 2016-07-16 ENCOUNTER — Other Ambulatory Visit: Payer: Self-pay | Admitting: Internal Medicine

## 2016-07-19 ENCOUNTER — Telehealth: Payer: Self-pay | Admitting: Family Medicine

## 2016-07-19 NOTE — Telephone Encounter (Signed)
Pt is due for yearly appointments.  Please schedule medicare wellness with Wynetta Fines if appropriate and cpx with Dr. Mamie Nick.  Have her come fasting for lab work.  Thanks!!

## 2016-07-19 NOTE — Telephone Encounter (Signed)
Sent to the pharmacy by e-scribe for 90 days.  Pt due in Feb for yearly.  Message sent to scheduling.

## 2016-07-20 NOTE — Telephone Encounter (Signed)
Pt has been schedule for both

## 2016-08-01 ENCOUNTER — Ambulatory Visit (INDEPENDENT_AMBULATORY_CARE_PROVIDER_SITE_OTHER): Payer: Medicare Other

## 2016-08-01 VITALS — BP 142/82 | HR 74 | Ht 65.0 in | Wt 218.2 lb

## 2016-08-01 DIAGNOSIS — E2839 Other primary ovarian failure: Secondary | ICD-10-CM | POA: Diagnosis not present

## 2016-08-01 DIAGNOSIS — Z0000 Encounter for general adult medical examination without abnormal findings: Secondary | ICD-10-CM

## 2016-08-01 NOTE — Patient Instructions (Addendum)
Ms. Schmuhl , Thank you for taking time to come for your Medicare Wellness Visit. I appreciate your ongoing commitment to your health goals. Please review the following plan we discussed and let me know if I can assist you in the future.   Will check on when she took flu vaccine this year   Educated to check with insurance regarding coverage of Shingles vaccination on Part D or Part B and may have lower co-pay if provided on the Part D side Keep in mind 2018; series of 2 injections, 8 weeks apart; may check with insurance when you come back in   A Tetanus is recommended every 10 years. Medicare covers a tetanus if you have a cut or wound; otherwise, there may be a charge. If you had not had a tetanus with pertusses, known as the Tdap, you can take this anytime.   Wants to discuss GYN with Dr. Regis Bill  Regarding your bone density; there is no record here at [facility].  Mammogram done at [facility] so will order dexa to be done there  Recommendations for Dexa Scan Female over the age of 39 Man age 71 or older If you broke a bone past the age of 66 Women menopausal age with risk factors (thin frame; smoker; hx of fx ) Post menopausal women under the age of 86 with risk factors A man age 34 to 90 with risk factors Other: Spine xray that is showing break of bone loss Back pain with possible break Height loss of 1/2 inch or more within one year Total loss in height of 1.5 inches from your original height  Calcium 1224m with Vit D 800u per day; more as directed by physician Strength building exercises discussed; can include walking; housework; small weights or stretch bands; silver sneakers [facility]    These are the goals we discussed: Goals    . Exercise 150 minutes per week (moderate activity)          Will commit to more walking [place] nearby; no one to go with her; to put into schedule     . Weight (lb) < 200 lb (90.7 kg)          Being active and watching her diet Serving  Sizes A serving size is a measured amount of food or drink, such as one slice of bread, that has an associated nutrient content. Knowing the serving size of a food or drink can help you determine how much of that food you should consume. What is the size of one serving? The size of one healthy serving depends on the food or drink. To determine a serving size, read the food label. If the food or drink does not have a food label, try to find serving size information online. Or, use the following to estimate the size of one adult serving: Grain  1 slice bread.  bagel.  cup pasta. Vegetable   cup cooked or canned vegetables. 1 cup raw, leafy greens. Fruit   cup canned fruit. 1 medium fruit.  cup dried fruit. Meat and Other Protein Sources  1 oz meat, poultry, or fish.  cup cooked beans. 1 egg.  cup nuts or seeds. 1 Tbsp nut butter.  cup tofu or tempeh. 2 Tbsp hummus. Dairy  An individual container of yogurt (6-8 oz). 1 piece of cheese the size of your thumb (1 oz). 1 cup (8 oz) milk or milk alternative. Fat  A piece the size of one dice. 1  tsp soft margarine. 1 Tbsp mayonnaise. 1 tsp vegetable oil. 1 Tbsp regular salad dressing. 2 Tbsp low-fat salad dressing. How many servings should I eat from each food group each day? The following are the suggested number of servings to try and have every day from each food group. You can also look at your eating throughout the week and aim for meeting these requirements on most days for overall healthy eating. Grain  6-8 servings. Try to have half of your grains from whole grains, such as whole wheat bread, corn tortillas, oatmeal, brown rice, whole wheat pasta, and bulgur. Vegetable  At least 2-3 servings. Fruit  2 servings. Meat and Other Protein Foods  5-6 servings. Aim to have lean proteins, such as chicken, [place], fish, beans, or tofu. Dairy  3 servings. Choose low-fat or nonfat if you are trying to control your weight. Fat  2-3  servings. Is a serving the same thing as a portion? No. A portion is the actual amount you eat, which may be more than one serving. Knowing the specific serving size of a food and the nutritional information that goes with it can help you make a healthy decision on what size portion to eat. What are some tips to help me learn healthy serving sizes?  Check food labels for serving sizes. Many foods that come as a single portion actually contain multiple servings.  Determine the serving size of foods you commonly eat and figure out how large a portion you usually eat.  Measure the number of servings that can be held by the bowls, glasses, cups, and plates you typically use. For example, pour your breakfast cereal into your regular bowl and then pour it into a measuring cup.  For 1-2 days, measure the serving sizes of all the foods you eat.  Practice estimating serving sizes and determining how big your portions should be. This information is not intended to replace advice given to you by your health care provider. Make sure you discuss any questions you have with your health care provider. Document Released: 03/11/2003 Document Revised: 02/05/2016 Document Reviewed: 09/09/2013 Elsevier Interactive Patient Education  2017 [employer].         This is a list of the screening recommended for you and due dates:  Health Maintenance  Topic Date Due  . Shingles Vaccine  10/23/2005  . DEXA scan (bone density measurement)  10/24/2010  . Flu Shot  01/25/2016  . Mammogram  01/26/2017  . Tetanus Vaccine  03/27/2019  . Colon Cancer Screening  02/14/2024  .  Hepatitis C: One time screening is recommended by Center for Disease Control  (CDC) for  adults born from [birth year] through 1965.   Completed  . Pneumonia vaccines  Completed   Educated regarding cholesterol lowering diet and does not need information at this time     Bone Densitometry Introduction Bone densitometry is an imaging test that  uses a special X-ray to measure the amount of calcium and other minerals in your bones (bone density). This test is also known as a bone mineral density test or dual-energy X-ray absorptiometry (DXA). The test can measure bone density at your hip and your spine. It is similar to having a regular X-ray. You may have this test to:  Diagnose a condition that causes weak or thin bones (osteoporosis).  Predict your risk of a broken bone (fracture).  Determine how well osteoporosis treatment is working. Tell a health care provider about:  Any allergies you have.  All medicines you are taking, including vitamins, herbs, eye drops, creams, and over-the-counter medicines.  Any problems you or family members have had with anesthetic medicines.  Any blood disorders you have.  Any surgeries you have had.  Any medical conditions you have.  Possibility of pregnancy.  Any other medical test you had within the previous 14 days that used contrast material. What are the risks? Generally, this is a safe procedure. However, problems can occur and may include the following:  This test exposes you to a very small amount of radiation.  The risks of radiation exposure may be greater to unborn children. What happens before the procedure?  Do not take any calcium supplements for 24 hours before having the test. You can otherwise eat and drink what you usually do.  Take off all metal jewelry, eyeglasses, dental appliances, and any other metal objects. What happens during the procedure?  You may lie on an exam table. There will be an X-ray generator below you and an imaging device above you.  Other devices, such as boxes or braces, may be used to position your body properly for the scan.  You will need to lie still while the machine slowly scans your body.  The images will show up on a computer monitor. What happens after the procedure? You may need more testing at a later time. This information  is not intended to replace advice given to you by your health care provider. Make sure you discuss any questions you have with your health care provider. Document Released: 07/04/2004 Document Revised: 11/18/2015 Document Reviewed: 11/20/2013  2017 Elsevier   Fall Prevention in the Home Introduction Falls can cause injuries. They can happen to people of all ages. There are many things you can do to make your home safe and to help prevent falls. What can I do on the outside of my home?  Regularly fix the edges of walkways and driveways and fix any cracks.  Remove anything that might make you trip as you walk through a door, such as a raised step or threshold.  Trim any bushes or trees on the path to your home.  Use bright outdoor lighting.  Clear any walking paths of anything that might make someone trip, such as rocks or tools.  Regularly check to see if handrails are loose or broken. Make sure that both sides of any steps have handrails.  Any raised decks and porches should have guardrails on the edges.  Have any leaves, snow, or ice cleared regularly.  Use sand or salt on walking paths during winter.  Clean up any spills in your garage right away. This includes oil or grease spills. What can I do in the bathroom?  Use night lights.  Install grab bars by the toilet and in the tub and shower. Do not use towel bars as grab bars.  Use non-skid mats or decals in the tub or shower.  If you need to sit down in the shower, use a plastic, non-slip stool.  Keep the floor dry. Clean up any water that spills on the floor as soon as it happens.  Remove soap buildup in the tub or shower regularly.  Attach bath mats securely with double-sided non-slip rug tape.  Do not have throw rugs and other things on the floor that can make you trip. What can I do in the bedroom?  Use night lights.  Make sure that you have a light by your bed that is easy to reach.  Do not use any sheets  or blankets that are too big for your bed. They should not hang down onto the floor.  Have a firm chair that has side arms. You can use this for support while you get dressed.  Do not have throw rugs and other things on the floor that can make you trip. What can I do in the kitchen?  Clean up any spills right away.  Avoid walking on wet floors.  Keep items that you use a lot in easy-to-reach places.  If you need to reach something above you, use a strong step stool that has a grab bar.  Keep electrical cords out of the way.  Do not use floor polish or wax that makes floors slippery. If you must use wax, use non-skid floor wax.  Do not have throw rugs and other things on the floor that can make you trip. What can I do with my stairs?  Do not leave any items on the stairs.  Make sure that there are handrails on both sides of the stairs and use them. Fix handrails that are broken or loose. Make sure that handrails are as long as the stairways.  Check any carpeting to make sure that it is firmly attached to the stairs. Fix any carpet that is loose or worn.  Avoid having throw rugs at the top or bottom of the stairs. If you do have throw rugs, attach them to the floor with carpet tape.  Make sure that you have a light switch at the top of the stairs and the bottom of the stairs. If you do not have them, ask someone to add them for you. What else can I do to help prevent falls?  Wear shoes that:  Do not have high heels.  Have rubber bottoms.  Are comfortable and fit you well.  Are closed at the toe. Do not wear sandals.  If you use a stepladder:  Make sure that it is fully opened. Do not climb a closed stepladder.  Make sure that both sides of the stepladder are locked into place.  Ask someone to hold it for you, if possible.  Clearly mark and make sure that you can see:  Any grab bars or handrails.  First and last steps.  Where the edge of each step is.  Use  tools that help you move around (mobility aids) if they are needed. These include:  Canes.  Walkers.  Scooters.  Crutches.  Turn on the lights when you go into a dark area. Replace any light bulbs as soon as they burn out.  Set up your furniture so you have a clear path. Avoid moving your furniture around.  If any of your floors are uneven, fix them.  If there are any pets around you, be aware of where they are.  Review your medicines with your doctor. Some medicines can make you feel dizzy. This can increase your chance of falling. Ask your doctor what other things that you can do to help prevent falls. This information is not intended to replace advice given to you by your health care provider. Make sure you discuss any questions you have with your health care provider. Document Released: 04/08/2009 Document Revised: 11/18/2015 Document Reviewed: 07/17/2014  2017 Elsevier  Health Maintenance, Female Introduction Adopting a healthy lifestyle and getting preventive care can go a long way to promote health and wellness. Talk with your health care provider about what schedule of regular examinations is right  for you. This is a good chance for you to check in with your provider about disease prevention and staying healthy. In between checkups, there are plenty of things you can do on your own. Experts have done a lot of research about which lifestyle changes and preventive measures are most likely to keep you healthy. Ask your health care provider for more information. Weight and diet Eat a healthy diet  Be sure to include plenty of vegetables, fruits, low-fat dairy products, and lean protein.  Do not eat a lot of foods high in solid fats, added sugars, or salt.  Get regular exercise. This is one of the most important things you can do for your health.  Most adults should exercise for at least 150 minutes each week. The exercise should increase your heart rate and make you sweat  (moderate-intensity exercise).  Most adults should also do strengthening exercises at least twice a week. This is in addition to the moderate-intensity exercise. Maintain a healthy weight  Body mass index (BMI) is a measurement that can be used to identify possible weight problems. It estimates body fat based on height and weight. Your health care provider can help determine your BMI and help you achieve or maintain a healthy weight.  For females 62 years of age and older:  A BMI below 18.5 is considered underweight.  A BMI of 18.5 to 24.9 is normal.  A BMI of 25 to 29.9 is considered overweight.  A BMI of 30 and above is considered obese. Watch levels of cholesterol and blood lipids  You should start having your blood tested for lipids and cholesterol at 71 years of age, then have this test every 5 years.  You may need to have your cholesterol levels checked more often if:  Your lipid or cholesterol levels are high.  You are older than 71 years of age.  You are at high risk for heart disease. Cancer screening Lung Cancer  Lung cancer screening is recommended for adults 54-59 years old who are at high risk for lung cancer because of a history of smoking.  A yearly low-dose CT scan of the lungs is recommended for people who:  Currently smoke.  Have quit within the past 15 years.  Have at least a 30-pack-year history of smoking. A pack year is smoking an average of one pack of cigarettes a day for 1 year.  Yearly screening should continue until it has been 15 years since you quit.  Yearly screening should stop if you develop a health problem that would prevent you from having lung cancer treatment. Breast Cancer  Practice breast self-awareness. This means understanding how your breasts normally appear and feel.  It also means doing regular breast self-exams. Let your health care provider know about any changes, no matter how small.  If you are in your 20s or 30s, you  should have a clinical breast exam (CBE) by a health care provider every 1-3 years as part of a regular health exam.  If you are 51 or older, have a CBE every year. Also consider having a breast X-ray (mammogram) every year.  If you have a family history of breast cancer, talk to your health care provider about genetic screening.  If you are at high risk for breast cancer, talk to your health care provider about having an MRI and a mammogram every year.  Breast cancer gene (BRCA) assessment is recommended for women who have family members with BRCA-related cancers. BRCA-related cancers include:  Breast.  Ovarian.  Tubal.  Peritoneal cancers.  Results of the assessment will determine the need for genetic counseling and BRCA1 and BRCA2 testing. Cervical Cancer  Your health care provider may recommend that you be screened regularly for cancer of the pelvic organs (ovaries, uterus, and vagina). This screening involves a pelvic examination, including checking for microscopic changes to the surface of your cervix (Pap test). You may be encouraged to have this screening done every 3 years, beginning at age 25.  For women ages 67-65, health care providers may recommend pelvic exams and Pap testing every 3 years, or they may recommend the Pap and pelvic exam, combined with testing for human papilloma virus (HPV), every 5 years. Some types of HPV increase your risk of cervical cancer. Testing for HPV may also be done on women of any age with unclear Pap test results.  Other health care providers may not recommend any screening for nonpregnant women who are considered low risk for pelvic cancer and who do not have symptoms. Ask your health care provider if a screening pelvic exam is right for you.  If you have had past treatment for cervical cancer or a condition that could lead to cancer, you need Pap tests and screening for cancer for at least 20 years after your treatment. If Pap tests have been  discontinued, your risk factors (such as having a new sexual partner) need to be reassessed to determine if screening should resume. Some women have medical problems that increase the chance of getting cervical cancer. In these cases, your health care provider may recommend more frequent screening and Pap tests. Colorectal Cancer  This type of cancer can be detected and often prevented.  Routine colorectal cancer screening usually begins at 71 years of age and continues through 71 years of age.  Your health care provider may recommend screening at an earlier age if you have risk factors for colon cancer.  Your health care provider may also recommend using home test kits to check for hidden blood in the stool.  A small camera at the end of a tube can be used to examine your colon directly (sigmoidoscopy or colonoscopy). This is done to check for the earliest forms of colorectal cancer.  Routine screening usually begins at age 10.  Direct examination of the colon should be repeated every 5-10 years through 71 years of age. However, you may need to be screened more often if early forms of precancerous polyps or small growths are found. Skin Cancer  Check your skin from head to toe regularly.  Tell your health care provider about any new moles or changes in moles, especially if there is a change in a mole's shape or color.  Also tell your health care provider if you have a mole that is larger than the size of a pencil eraser.  Always use sunscreen. Apply sunscreen liberally and repeatedly throughout the day.  Protect yourself by wearing long sleeves, pants, a wide-brimmed hat, and sunglasses whenever you are outside. Heart disease, diabetes, and high blood pressure  High blood pressure causes heart disease and increases the risk of stroke. High blood pressure is more likely to develop in:  People who have blood pressure in the high end of the normal range (130-139/85-89 mm Hg).  People  who are overweight or obese.  People who are African American.  If you are 46-50 years of age, have your blood pressure checked every 3-5 years. If you are 71 years of age  or older, have your blood pressure checked every year. You should have your blood pressure measured twice-once when you are at a hospital or clinic, and once when you are not at a hospital or clinic. Record the average of the two measurements. To check your blood pressure when you are not at a hospital or clinic, you can use:  An automated blood pressure machine at a pharmacy.  A home blood pressure monitor.  If you are between 21 years and 70 years old, ask your health care provider if you should take aspirin to prevent strokes.  Have regular diabetes screenings. This involves taking a blood sample to check your fasting blood sugar level.  If you are at a normal weight and have a low risk for diabetes, have this test once every three years after 71 years of age.  If you are overweight and have a high risk for diabetes, consider being tested at a younger age or more often. Preventing infection Hepatitis B  If you have a higher risk for hepatitis B, you should be screened for this virus. You are considered at high risk for hepatitis B if:  You were born in a country where hepatitis B is common. Ask your health care provider which countries are considered high risk.  Your parents were born in a high-risk country, and you have not been immunized against hepatitis B (hepatitis B vaccine).  You have HIV or AIDS.  You use needles to inject street drugs.  You live with someone who has hepatitis B.  You have had sex with someone who has hepatitis B.  You get hemodialysis treatment.  You take certain medicines for conditions, including cancer, organ transplantation, and autoimmune conditions. Hepatitis C  Blood testing is recommended for:  Everyone born from [birth year] through 1965.  Anyone with known risk factors for  hepatitis C. Sexually transmitted infections (STIs)  You should be screened for sexually transmitted infections (STIs) including gonorrhea and chlamydia if:  You are sexually active and are younger than 71 years of age.  You are older than 71 years of age and your health care provider tells you that you are at risk for this type of infection.  Your sexual activity has changed since you were last screened and you are at an increased risk for chlamydia or gonorrhea. Ask your health care provider if you are at risk.  If you do not have HIV, but are at risk, it may be recommended that you take a prescription medicine daily to prevent HIV infection. This is called pre-exposure prophylaxis (PrEP). You are considered at risk if:  You are sexually active and do not regularly use condoms or know the HIV status of your partner(s).  You take drugs by injection.  You are sexually active with a partner who has HIV. Talk with your health care provider about whether you are at high risk of being infected with HIV. If you choose to begin PrEP, you should first be tested for HIV. You should then be tested every 3 months for as long as you are taking PrEP. Pregnancy  If you are premenopausal and you may become pregnant, ask your health care provider about preconception counseling.  If you may become pregnant, take 400 to 800 micrograms (mcg) of folic acid every day.  If you want to prevent pregnancy, talk to your health care provider about birth control (contraception). Osteoporosis and menopause  Osteoporosis is a disease in which the bones lose minerals and strength with  aging. This can result in serious bone fractures. Your risk for osteoporosis can be identified using a bone density scan.  If you are 67 years of age or older, or if you are at risk for osteoporosis and fractures, ask your health care provider if you should be screened.  Ask your health care provider whether you should take a calcium  or vitamin D supplement to lower your risk for osteoporosis.  Menopause may have certain physical symptoms and risks.  Hormone replacement therapy may reduce some of these symptoms and risks. Talk to your health care provider about whether hormone replacement therapy is right for you. Follow these instructions at home:  Schedule regular health, dental, and eye exams.  Stay current with your immunizations.  Do not use any tobacco products including cigarettes, chewing tobacco, or electronic cigarettes.  If you are pregnant, do not drink alcohol.  If you are breastfeeding, limit how much and how often you drink alcohol.  Limit alcohol intake to no more than 1 drink per day for nonpregnant women. One drink equals 12 ounces of beer, 5 ounces of wine, or 1 ounces of hard liquor.  Do not use street drugs.  Do not share needles.  Ask your health care provider for help if you need support or information about quitting drugs.  Tell your health care provider if you often feel depressed.  Tell your health care provider if you have ever been abused or do not feel safe at home. This information is not intended to replace advice given to you by your health care provider. Make sure you discuss any questions you have with your health care provider. Document Released: 12/26/2010 Document Revised: 11/18/2015 Document Reviewed: 03/16/2015  2017 Elsevier

## 2016-08-01 NOTE — Progress Notes (Addendum)
Subjective:   Christy Young is a 71 y.o. female who presents for Medicare Annual (Subsequent) preventive examination.  Medicare Annual Preventive Care Visit - Subsequent Last [place] as poor, fair, good or great?  Good    BMI 36;  Diet  Breakfast oatmeal and banana  Lunch; eggs and spinach;  Vegetables and meat Dinner; cook most of her meals; bake chicken   Lipids chol 247; HDL 56; LDL 169 Trig 114   Stress loss sister and mother;  Sister was 48   Exercise States she will exercise and walk more   Dental - has to major dental work;  Discussed Lawyer work, but was told her work was to expensive Has been given some leads regarding dentures    1.) Patient-completed HRA during the assessment today   2.) Review of Medical History: -PMH, PSH, Family History and current specialty and care providers reviewed and updated and listed below  - see scanned in document in chart and below  Social History   Social History  . Marital status: Single    Spouse name: N/A  . Number of children: N/A  . Years of education: N/A   Occupational History  . Not on file.   Social History Main Topics  . Smoking status: Never Smoker  . Smokeless tobacco: Never Used  . Alcohol use Yes     Comment: qo pm may drink a glass of wine   . Drug use: No  . Sexual activity: Not on file   Other Topics Concern  . Not on file   Social History Narrative   Occupation: retired Event organiser, 3 yrs of college   Bereaved parent   [place] in 12/31/22    No pets   G31P2   Sis died of cancer lymphoma 67  and bro now with lung cancer and spread.died 06-15-23.    Family History  Problem Relation Age of Onset  . Hypertension Mother   . Arthritis Mother   . Alcohol abuse Father     deceased  . Diabetes Father   . Lymphoma Sister     non hodgkins  . Heart attack Son 54    1999  . Lung cancer Brother     Medical issues that coincide with lifestyle:  HTN,  Hyperlipidemia but can't take meds  Tolerating BP med    3.) Review of functional ability and level of safety: See Medicare screens for hearing; vision; falls; IADLs and ADLs, Advanced Directives given from [facility] and reviewed Instructed to bring a copy in  Safety information; lives alone  no issues to date   Stressors: loss of sister/ Christmas morning 2017 Mother with Alz in care    General: alert, appear well hydrated and in no acute distress   Mood stable; attentive;   See patient instructions for recommendations.  4)The following written screening schedule of preventive measures were reviewed with assessment and plan made per below, orders and patient instructions:   AAA screening done if applicable/ non smoking female   Alcohol screening done/ one glass of wine 3 to 4 days a week  Obesity Screening and counseling reviewed  Educated on weight loss and doe have a weight loss plan   STI screening (Hep C if born [birth year]) is completed   Tobacco Screening done and is a non smoker   Vaccines:  (PPSV23 -one dose after 64, one before if risk factors),  Prevnar 13 - one  does post 20 Influenza yearly / thinks she had the flu vaccine at Urgent care  Will check on this when she comes back  Tetanus q 10 years or Tdap if not taken [place] in [place] and feels many of her vaccines were given at that time   ASSESSMENT/PLAN:   Screening mammograph (yearly if >40) Due in  august 2018  Screening Pap smear/pelvic exam (q2 years) waives  due to age  Will discuss GYN with Dr. Regis Bill Has had TAH but thinks she may need one as she still has her ovaries    Colorectal cancer screening: colonoscopy q10y ASSESSMENT/PLAN: due 01/2024 ( was completed by [facility] but may transfer care for the next colonoscopy   Bone mass measurements(covered q2y Do not see Dexa scan in epic   if indicated - estrogen def, osteoporosis, hyperparathyroid, vertebral abnormalities, osteoporosis or  steroids) ASSESSMENT/PLAN:  The patient feels it was done many years ago;  Discussed and decided to go ahead and schedule  Will fax order to [facility]     Hearing Screening   125Hz  250Hz  500Hz  1000Hz  2000Hz  3000Hz  4000Hz  6000Hz  8000Hz   Right ear:       100    Left ear:       100    Comments: Is good  Vision Screening Comments: Eye ; has cataract Has had eye exam about 2 months ago  Dr. Alois Cliche   Cardiovascular screening blood tests (lipids q5y) ASSESSMENT/PLAN: did not want to discuss but does want to lose weight and start walking   Diabetes screening tests neg   5) Summary: -risk factors and conditions per above assessment were discussed and treatment, recommendations and referrals were offered per documentation above and orders and patient instructions.  Education and counseling regarding the above review of health provided with a plan for the following: -fall prevention strategies discussed  -personal and community safety appears stable  -healthy lifestyle discussed (weight loss, exercise, etc_  -importance and resources for completing advanced directives discussed -see patient instructions below for any other recommendations provided   Cardiac Risk Factors include: advanced age (>60men, >49 women);dyslipidemia;family history of premature cardiovascular disease;hypertension;obesity (BMI >30kg/m2)     Objective:     Vitals: BP (!) 142/82   Pulse 74   Ht 5\' 5"  (1.651 m)   Wt 218 lb 3 oz (99 kg)   SpO2 96%   BMI 36.31 kg/m   Body mass index is 36.31 kg/m.   Tobacco History  Smoking Status  . Never Smoker  Smokeless Tobacco  . Never Used     Counseling given: Not Answered   Past Medical History:  Diagnosis Date  . ARTHRITIS   . Carpal tunnel syndrome 08/23/2015   Left  . Fatigue   . Gout, unspecified   . Hyperlipidemia   . Hypertension    meds since age 65   . Hyperuricemia   . Nonspecific abnormal electrocardiogram (ECG) (EKG)    t wave non  acute    . OBESITY   . Palpitations    hospitalized 2005 felt from stress neg cards eval  . 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 ABDOMINAL HYSTERECTOMY    . FACIAL COSMETIC SURGERY  1980   Family History  Problem Relation Age of Onset  . Hypertension Mother   . Arthritis Mother   . Alcohol abuse Father     deceased  . Diabetes Father   .  Lymphoma Sister     non hodgkins  . Heart attack Son 63    1999  . Lung cancer Brother    History  Sexual Activity  . Sexual activity: Not on file    Outpatient Encounter Prescriptions as of 08/01/2016  Medication Sig  . allopurinol (ZYLOPRIM) 100 MG tablet TAKE 2 TABLETS (=200 MG)   DAILY  . BYSTOLIC 5 MG tablet TAKE 1 TABLET DAILY  . Omega-3 Fatty Acids (FISH OIL) 1000 MG CPDR Take by mouth.   No facility-administered encounter medications on file as of 08/01/2016.     Activities of Daily Living In your present state of health, do you have any difficulty performing the following activities: 08/01/2016  Hearing? N  Vision? N  Difficulty concentrating or making decisions? (No Data)  Walking or climbing stairs? N  Dressing or bathing? N  Doing errands, shopping? N  Preparing Food and eating ? N  Using the Toilet? N  In the past six months, have you accidently leaked urine? N  Do you have problems with loss of bowel control? N  Managing your Medications? N  Managing your Finances? N  Housekeeping or managing your Housekeeping? N  Some recent data might be hidden    Patient Care Team: Burnis Medin, MD as PCP - General (Internal Medicine) Valinda Party, MD as Referring Physician (Rheumatology)    Assessment:     Exercise Activities and Dietary recommendations Current Exercise Habits: Home exercise routine, Type of exercise: walking, Time (Minutes): 30, Frequency (Times/Week): 3, Weekly Exercise (Minutes/Week): 90, Intensity: Moderate  Goals    .  Exercise 150 minutes per week (moderate activity)          Will commit to more walking [place] nearby; no one to go with her; to put into schedule     . Weight (lb) < 200 lb (90.7 kg)          Being active and watching her diet Serving Sizes A serving size is a measured amount of food or drink, such as one slice of bread, that has an associated nutrient content. Knowing the serving size of a food or drink can help you determine how much of that food you should consume. What is the size of one serving? The size of one healthy serving depends on the food or drink. To determine a serving size, read the food label. If the food or drink does not have a food label, try to find serving size information online. Or, use the following to estimate the size of one adult serving: Grain  1 slice bread.  bagel.  cup pasta. Vegetable   cup cooked or canned vegetables. 1 cup raw, leafy greens. Fruit   cup canned fruit. 1 medium fruit.  cup dried fruit. Meat and Other Protein Sources  1 oz meat, poultry, or fish.  cup cooked beans. 1 egg.  cup nuts or seeds. 1 Tbsp nut butter.  cup tofu or tempeh. 2 Tbsp hummus. Dairy  An individual container of yogurt (6-8 oz). 1 piece of cheese the size of your thumb (1 oz). 1 cup (8 oz) milk or milk alternative. Fat  A piece the size of one dice. 1 tsp soft margarine. 1 Tbsp mayonnaise. 1 tsp vegetable oil. 1 Tbsp regular salad dressing. 2 Tbsp low-fat salad dressing. How many servings should I eat from each food group each day? The following are the suggested number of servings to try and have every day from each food group.  You can also look at your eating throughout the week and aim for meeting these requirements on most days for overall healthy eating. Grain  6-8 servings. Try to have half of your grains from whole grains, such as whole wheat bread, corn tortillas, oatmeal, brown rice, whole wheat pasta, and bulgur. Vegetable  At least 2-3 servings. Fruit   2 servings. Meat and Other Protein Foods  5-6 servings. Aim to have lean proteins, such as chicken, [place], fish, beans, or tofu. Dairy  3 servings. Choose low-fat or nonfat if you are trying to control your weight. Fat  2-3 servings. Is a serving the same thing as a portion? No. A portion is the actual amount you eat, which may be more than one serving. Knowing the specific serving size of a food and the nutritional information that goes with it can help you make a healthy decision on what size portion to eat. What are some tips to help me learn healthy serving sizes?  Check food labels for serving sizes. Many foods that come as a single portion actually contain multiple servings.  Determine the serving size of foods you commonly eat and figure out how large a portion you usually eat.  Measure the number of servings that can be held by the bowls, glasses, cups, and plates you typically use. For example, pour your breakfast cereal into your regular bowl and then pour it into a measuring cup.  For 1-2 days, measure the serving sizes of all the foods you eat.  Practice estimating serving sizes and determining how big your portions should be. This information is not intended to replace advice given to you by your health care provider. Make sure you discuss any questions you have with your health care provider. Document Released: 03/11/2003 Document Revised: 02/05/2016 Document Reviewed: 09/09/2013 Elsevier Interactive Patient Education  2017 [employer] Risk  08/01/2016 08/04/2015 07/08/2014 04/11/2013  Falls in the past year? No No No No   Depression Screen PHQ 2/9 Scores 08/01/2016 08/04/2015 07/08/2014 04/11/2013  PHQ - 2 Score 0 0 0 2  PHQ- 9 Score - - - 6   negative Verbalizes good coping skills   Cognitive Function No issues at present      6CIT Screen 08/01/2016  What Year? 0 points  What month? 0 points  What time? 0 points  Count back from 20 0  points  Months in reverse 0 points  Repeat phrase 0 points  Total Score 0    Immunization History  Administered Date(s) Administered  . Influenza Split 04/23/2012  . Influenza, High Dose Seasonal PF 05/03/2015  . Influenza,inj,Quad PF,36+ Mos 04/11/2013  . Pneumococcal Conjugate-13 08/04/2015  . Pneumococcal Polysaccharide-23 12/26/2010  . Td 03/26/2009   Screening Tests Health Maintenance  Topic Date Due  . ZOSTAVAX  10/23/2005  . DEXA SCAN  10/24/2010  . INFLUENZA VACCINE  01/25/2016  . MAMMOGRAM  01/26/2017  . TETANUS/TDAP  03/27/2019  . COLONOSCOPY  02/14/2024  . Hepatitis C Screening  Completed  . PNA vac Low Risk Adult  Completed      Plan:    PCP Notes  Health Maintenance Will check on when she took the flu vaccine but is sure she had it  Thinks she may have taken the shingles; discussed 2018 series coming  Colonoscopy not due but will transfer care to Colon at any time, and cost of doing so unless injured  Thinks she had pertussis in 2010;   Will have Bone density and will order today Prefers to remain with [facility]   Abnormal Screens discussed lipids; did not want to address but is willing to lose weight and start walking   Referrals - wants to discuss gyn referral since she has not had TAH but still has ovaries   Patient concerns; none Did just lose her sister and mother with Alz   Nurse Concerns; Reviewed Advanced Directives from [facility]   Next PCP 09/06/2016   uring the course of the visit the patient was educated and counseled about the following appropriate screening and preventive services:   Vaccines to include Pneumoccal, Influenza, Hepatitis B, Td, Zostavax, HCV  Electrocardiogram  Cardiovascular Disease  Colorectal cancer screening   Bone density screening  Diabetes screening  Glaucoma screening  Mammography/PAP  Nutrition counseling   Patient Instructions (the written plan) was given to the patient.    [id number], RN  08/01/2016   Reviewed and agree  Lottie Dawson, MD

## 2016-09-05 NOTE — Progress Notes (Signed)
Chief Complaint  Patient presents with  . Annual Exam    yearly visit   . Hypertension    meds    HPI: Christy Young 71 y.o. come in for Chronic disease management   Se wellness exam   Had some episodes with gambling where she went spent more than she thought she should in the last years had multiple losses Sr. Brother and mother was in hospice at some point she has some stress considering seeing a counselor or the hospice counselor. Blood pressure is working on it not really exercising but trying to eat better taking medication. In regard to her gout no flareups but is only taking 100 allopurinol a day. No side effects Has had side effects to statins in the past so not taking at this time. No chest pain shortness of breath or cardiovascular acute symptoms. She did see the dermatologist about her rash on your elbow didn't think it was psoriasis. Is also seen a rheumatologist doesn't know if we have the records. ROS: See pertinent positives and negatives per HPI.  Household of 1 no pets sleeps about 6 hours. [place] gambling usually not in debt although lost some money recently. Past Medical History:  Diagnosis Date  . ARTHRITIS   . Carpal tunnel syndrome 08/23/2015   Left  . Fatigue   . Gout, unspecified   . Hyperlipidemia   . Hypertension    meds since age 54   . Hyperuricemia   . Nonspecific abnormal electrocardiogram (ECG) (EKG)    t wave non acute    . OBESITY   . Palpitations    hospitalized 2005 felt from stress neg cards eval  . 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 Hypertension Mother   . Arthritis Mother   . Alcohol abuse Father     deceased  . Diabetes Father   . Lymphoma Sister     non hodgkins  . Heart attack Son 62    1999  . Lung cancer Brother     Social History   Social History  . Marital status: Single    Spouse name: N/A  . Number of children:  N/A  . Years of education: N/A   Social History Main Topics  . Smoking status: Never Smoker  . Smokeless tobacco: Never Used  . Alcohol use Yes     Comment: qo pm may drink a glass of wine   . Drug use: No  . Sexual activity: Not Asked   Other Topics Concern  . None   Social History Narrative   Occupation: retired Event organiser, 3 yrs of college   Bereaved parent   [place] in 01/13/2023    No pets   G21P2   Sis died of cancer lymphoma 87  and bro now with lung cancer and spread.died 28-Jun-2023.    Outpatient Medications Prior to Visit  Medication Sig Dispense Refill  . allopurinol (ZYLOPRIM) 100 MG tablet TAKE 2 TABLETS (=200 MG)   DAILY 180 tablet 1  . BYSTOLIC 5 MG tablet TAKE 1 TABLET DAILY 90 tablet 0  . Omega-3 Fatty Acids (FISH OIL) 1000 MG CPDR Take by mouth.     No facility-administered medications prior to visit.      EXAM:  BP 120/80 (BP Location: Left Arm, Patient Position: Sitting, Cuff Size: Normal)   Pulse 76  Temp 98.1 F (36.7 C) (Oral)   Ht 5' 2.5" (1.588 m)   Wt 217 lb 8 oz (98.7 kg)   BMI 39.15 kg/m   Body mass index is 39.15 kg/m.  GENERAL: vitals reviewed and listed above, alert, oriented, appears well hydrated and in no acute distress HEENT: atraumatic, conjunctiva  clear, no obvious abnormalities on inspection of external nose and ears OP : no lesion edema or exudate  NECK: no obvious masses on inspection palpation  LUNGS: clear to auscultation bilaterally, no wheezes, rales or rhonchi, good air movement Breast: normal by inspection . No dimpling, discharge, masses, tenderness or discharge . Abdomen:  Sof,t normal bowel sounds without hepatosplenomegaly, no guarding rebound or masses no CVA tenderness CV: HRRR, no clubbing cyanosis or  peripheral edema nl cap refill  MS: moves all extremities without noticeable focal  Abnormality Skin: normal capillary refill ,turgor , color: No acute rashes ,petechiae or bruising extensor elbow   Thickened skin   PSYCH: pleasant and cooperative, no obvious depression or anxiety  BP Readings from Last 3 Encounters:  09/06/16 120/80  08/01/16 (!) 142/82  02/10/16 134/78    ASSESSMENT AND PLAN:  Discussed the following assessment and plan:  Essential hypertension - Plan: Basic metabolic panel, CBC with Differential/Platelet, Hepatic function panel, Lipid panel, Uric acid  H/O: gout - Plan: Basic metabolic panel, CBC with Differential/Platelet, Hepatic function panel, Lipid panel, Uric acid  Hyperlipidemia, unspecified hyperlipidemia type - statin intolerant  - Plan: Basic metabolic panel, CBC with Differential/Platelet, Hepatic function panel, Lipid panel, Uric acid  Elevated blood sugar - Plan: Basic metabolic panel, CBC with Differential/Platelet, Hepatic function panel, Lipid panel, Uric acid, Hemoglobin A1c  Medication management - Plan: Basic metabolic panel, CBC with Differential/Platelet, Hepatic function panel, Lipid panel, Uric acid  Bereavement Discussed referral to counselor for depressive symptoms versus just going to hospice initially for bereavement counseling and caution to not fall into destructive behaviors will grieving. Patient is aware. Also discussed lifestyle prevention semi-be helpful for her. Lab monitoring today. Taking 52 mhg asa  -Patient advised to return or notify health care team  if  new concerns arise.  Patient Instructions  I agree  With seeing     Hospice  support .   about the multiple losses .   Will notify you  of labs when available.  Healthy lifestyle includes : At least 150 minutes of exercise weeks  , weight at healthy levels, which is usually   BMI 19-25. Avoid trans fats and processed foods;  Increase fresh fruits and veges to 5 servings per day. And avoid sweet beverages including tea and juice. Mediterranean diet with olive oil and nuts have been noted to be heart and brain healthy . Avoid tobacco products . Limit  alcohol to   7 per week for women and 14 servings for men.  Get adequate sleep . Wear seat belts . Don't text and drive .    Plan yearly OV if all ok  In 1 year   Wellness with SUSAN     Complicated Grieving Grief is a normal response to the death of someone close to you. Feelings of fear, anger, and guilt can affect almost everyone who loses a loved one. It is also common to have symptoms of depression while you are grieving. These include problems with sleep, loss of appetite, and lack of energy. They may last for weeks or months after a loss. Complicated grief is different from normal grief or depression. Normal  grieving involves sadness and feelings of loss, but these feelings are not constant. Complicated grief is a constant and severe type of grief. It interferes with your ability to function normally. It may last for several months to a year or longer. Complicated grief may require treatment from a mental health care provider. What are the causes? It is not known why some people continue to struggle with grief and others do not. You may be at higher risk for complicated grief if:  The death of your loved one was sudden or unexpected.  The death of your loved one was due to a violent event.  Your loved one committed suicide.  Your loved one was a child or a young person.  You were very close to or dependent on the loved one.  You have a history of depression. What are the signs or symptoms? Signs and symptoms of complicated grief may include:  Feeling disbelief or numbness.  Being unable to enjoy good memories of your loved one.  Needing to avoid anything that reminds you of your loved one.  Being unable to stop thinking about the death.  Feeling intense anger or guilt.  Feeling alone and hopeless.  Feeling that your life is meaningless and empty.  Losing the desire to live. How is this diagnosed? Your health care provider may diagnose complicated grief if:  You have constant  symptoms of grief for 6-12 months or longer.  Your symptoms are interfering with your ability to live your life. Your health care provider may want you to see a mental health care provider. Many symptoms of depression are similar to the symptoms of complicated grief. It is important to be evaluated for complicated grief along with other mental health conditions. How is this treated? Talk therapy with a mental health provider is the most common treatment for complicated grief. During therapy, you will learn healthy ways to cope with the loss of your loved one. In some cases, your mental health care provider may also recommend antidepressant medicines. Follow these instructions at home:  Take care of yourself.  Eat regular meals and maintain a healthy diet. Eat plenty of fruits, vegetables, and whole grains.  Try to get some exercise each day.  Keep regular hours for sleep. Try to get at least 8 hours of sleep each night.  Do not use drugs or alcohol to ease your symptoms.  Take medicines only as directed by your health care provider.  Spend time with friends and loved ones.  Consider joining a grief (bereavement) support group to help you deal with your loss.  Keep all follow-up visits as directed by your health care provider. This is important. Contact a health care provider if:  Your symptoms keep you from functioning normally.  Your symptoms do not get better with treatment. Get help right away if:  You have serious thoughts of hurting yourself or someone else.  You have suicidal feelings. This information is not intended to replace advice given to you by your health care provider. Make sure you discuss any questions you have with your health care provider. Document Released: 06/12/2005 Document Revised: 11/18/2015 Document Reviewed: 11/20/2013 Elsevier Interactive Patient Education  2017 Jordan K. Kristen Bushway M.D. Lab Results  Component Value Date    WBC 7.2 09/06/2016   HGB 14.1 09/06/2016   HCT 42.2 09/06/2016   PLT 284.0 09/06/2016   GLUCOSE 91 09/06/2016   CHOL 279 (H) 09/06/2016  TRIG 105.0 09/06/2016   HDL 55.80 09/06/2016   LDLDIRECT 180.4 04/11/2013   LDLCALC 202 (H) 09/06/2016   ALT 18 09/06/2016   AST 14 09/06/2016   NA 142 09/06/2016   K 3.8 09/06/2016   CL 106 09/06/2016   CREATININE 0.74 09/06/2016   BUN 10 09/06/2016   CO2 27 09/06/2016   TSH 1.81 08/04/2015   HGBA1C 5.9 09/06/2016   MICROALBUR 1.4 12/01/2009

## 2016-09-06 ENCOUNTER — Encounter: Payer: Self-pay | Admitting: Internal Medicine

## 2016-09-06 ENCOUNTER — Ambulatory Visit (INDEPENDENT_AMBULATORY_CARE_PROVIDER_SITE_OTHER): Payer: Medicare Other | Admitting: Internal Medicine

## 2016-09-06 VITALS — BP 120/80 | HR 76 | Temp 98.1°F | Ht 62.5 in | Wt 217.5 lb

## 2016-09-06 DIAGNOSIS — I10 Essential (primary) hypertension: Secondary | ICD-10-CM

## 2016-09-06 DIAGNOSIS — Z634 Disappearance and death of family member: Secondary | ICD-10-CM

## 2016-09-06 DIAGNOSIS — E785 Hyperlipidemia, unspecified: Secondary | ICD-10-CM

## 2016-09-06 DIAGNOSIS — Z79899 Other long term (current) drug therapy: Secondary | ICD-10-CM | POA: Diagnosis not present

## 2016-09-06 DIAGNOSIS — R739 Hyperglycemia, unspecified: Secondary | ICD-10-CM | POA: Diagnosis not present

## 2016-09-06 DIAGNOSIS — Z8739 Personal history of other diseases of the musculoskeletal system and connective tissue: Secondary | ICD-10-CM

## 2016-09-06 LAB — CBC WITH DIFFERENTIAL/PLATELET
BASOS PCT: 0.9 % (ref 0.0–3.0)
Basophils Absolute: 0.1 10*3/uL (ref 0.0–0.1)
EOS ABS: 0.2 10*3/uL (ref 0.0–0.7)
EOS PCT: 2.3 % (ref 0.0–5.0)
HCT: 42.2 % (ref 36.0–46.0)
Hemoglobin: 14.1 g/dL (ref 12.0–15.0)
LYMPHS ABS: 2.7 10*3/uL (ref 0.7–4.0)
Lymphocytes Relative: 37.6 % (ref 12.0–46.0)
MCHC: 33.5 g/dL (ref 30.0–36.0)
MCV: 88.9 fl (ref 78.0–100.0)
MONO ABS: 0.6 10*3/uL (ref 0.1–1.0)
Monocytes Relative: 7.9 % (ref 3.0–12.0)
NEUTROS PCT: 51.3 % (ref 43.0–77.0)
Neutro Abs: 3.7 10*3/uL (ref 1.4–7.7)
PLATELETS: 284 10*3/uL (ref 150.0–400.0)
RBC: 4.75 Mil/uL (ref 3.87–5.11)
RDW: 13.8 % (ref 11.5–15.5)
WBC: 7.2 10*3/uL (ref 4.0–10.5)

## 2016-09-06 LAB — HEPATIC FUNCTION PANEL
ALBUMIN: 4.2 g/dL (ref 3.5–5.2)
ALK PHOS: 52 U/L (ref 39–117)
ALT: 18 U/L (ref 0–35)
AST: 14 U/L (ref 0–37)
BILIRUBIN DIRECT: 0.1 mg/dL (ref 0.0–0.3)
BILIRUBIN TOTAL: 0.8 mg/dL (ref 0.2–1.2)
TOTAL PROTEIN: 6.9 g/dL (ref 6.0–8.3)

## 2016-09-06 LAB — LIPID PANEL
CHOLESTEROL: 279 mg/dL — AB (ref 0–200)
HDL: 55.8 mg/dL (ref >39.00)
LDL Cholesterol: 202 mg/dL — ABNORMAL HIGH (ref 0–99)
NONHDL: 222.72
Total CHOL/HDL Ratio: 5
Triglycerides: 105 mg/dL (ref 0.0–149.0)
VLDL: 21 mg/dL (ref 0.0–40.0)

## 2016-09-06 LAB — BASIC METABOLIC PANEL
BUN: 10 mg/dL (ref 6–23)
CHLORIDE: 106 meq/L (ref 96–112)
CO2: 27 mEq/L (ref 19–32)
CREATININE: 0.74 mg/dL (ref 0.40–1.20)
Calcium: 10.7 mg/dL — ABNORMAL HIGH (ref 8.4–10.5)
GFR: 82.26 mL/min (ref >60.00)
GLUCOSE: 91 mg/dL (ref 70–99)
POTASSIUM: 3.8 meq/L (ref 3.5–5.1)
Sodium: 142 mEq/L (ref 135–145)

## 2016-09-06 LAB — HEMOGLOBIN A1C: Hgb A1c MFr Bld: 5.9 % (ref 4.6–6.5)

## 2016-09-06 LAB — URIC ACID: URIC ACID, SERUM: 6.5 mg/dL (ref 2.4–7.0)

## 2016-09-06 NOTE — Patient Instructions (Addendum)
I agree  With seeing     Hospice  support .   about the multiple losses .   Will notify you  of labs when available.  Healthy lifestyle includes : At least 150 minutes of exercise weeks  , weight at healthy levels, which is usually   BMI 19-25. Avoid trans fats and processed foods;  Increase fresh fruits and veges to 5 servings per day. And avoid sweet beverages including tea and juice. Mediterranean diet with olive oil and nuts have been noted to be heart and brain healthy . Avoid tobacco products . Limit  alcohol to  7 per week for women and 14 servings for men.  Get adequate sleep . Wear seat belts . Don't text and drive .    Plan yearly OV if all ok  In 1 year   Wellness with SUSAN     Complicated Grieving Grief is a normal response to the death of someone close to you. Feelings of fear, anger, and guilt can affect almost everyone who loses a loved one. It is also common to have symptoms of depression while you are grieving. These include problems with sleep, loss of appetite, and lack of energy. They may last for weeks or months after a loss. Complicated grief is different from normal grief or depression. Normal grieving involves sadness and feelings of loss, but these feelings are not constant. Complicated grief is a constant and severe type of grief. It interferes with your ability to function normally. It may last for several months to a year or longer. Complicated grief may require treatment from a mental health care provider. What are the causes? It is not known why some people continue to struggle with grief and others do not. You may be at higher risk for complicated grief if:  The death of your loved one was sudden or unexpected.  The death of your loved one was due to a violent event.  Your loved one committed suicide.  Your loved one was a child or a young person.  You were very close to or dependent on the loved one.  You have a history of depression. What are the  signs or symptoms? Signs and symptoms of complicated grief may include:  Feeling disbelief or numbness.  Being unable to enjoy good memories of your loved one.  Needing to avoid anything that reminds you of your loved one.  Being unable to stop thinking about the death.  Feeling intense anger or guilt.  Feeling alone and hopeless.  Feeling that your life is meaningless and empty.  Losing the desire to live. How is this diagnosed? Your health care provider may diagnose complicated grief if:  You have constant symptoms of grief for 6-12 months or longer.  Your symptoms are interfering with your ability to live your life. Your health care provider may want you to see a mental health care provider. Many symptoms of depression are similar to the symptoms of complicated grief. It is important to be evaluated for complicated grief along with other mental health conditions. How is this treated? Talk therapy with a mental health provider is the most common treatment for complicated grief. During therapy, you will learn healthy ways to cope with the loss of your loved one. In some cases, your mental health care provider may also recommend antidepressant medicines. Follow these instructions at home:  Take care of yourself.  Eat regular meals and maintain a healthy diet. Eat plenty of fruits, vegetables, and whole  grains.  Try to get some exercise each day.  Keep regular hours for sleep. Try to get at least 8 hours of sleep each night.  Do not use drugs or alcohol to ease your symptoms.  Take medicines only as directed by your health care provider.  Spend time with friends and loved ones.  Consider joining a grief (bereavement) support group to help you deal with your loss.  Keep all follow-up visits as directed by your health care provider. This is important. Contact a health care provider if:  Your symptoms keep you from functioning normally.  Your symptoms do not get better  with treatment. Get help right away if:  You have serious thoughts of hurting yourself or someone else.  You have suicidal feelings. This information is not intended to replace advice given to you by your health care provider. Make sure you discuss any questions you have with your health care provider. Document Released: 06/12/2005 Document Revised: 11/18/2015 Document Reviewed: 11/20/2013 Elsevier Interactive Patient Education  2017 [employer].

## 2016-09-14 ENCOUNTER — Telehealth: Payer: Self-pay | Admitting: Internal Medicine

## 2016-09-14 NOTE — Telephone Encounter (Signed)
Pt would like blood work results from 09-06-16

## 2016-09-15 NOTE — Telephone Encounter (Signed)
Pt states that she would like to take the Pravastatin although she has aches and pains with the medication.

## 2016-09-15 NOTE — Telephone Encounter (Signed)
FYI

## 2016-09-15 NOTE — Telephone Encounter (Signed)
See note in result note   Blood shows cholesterol now very high. ldl is above 200  Calcium levels also slightly elevated  And should be repeated when well hydrated  Plan:    Intact pth with Calcium level  Non fasting   Dx hypercalcemia when   Since cant take statins   Advise try  zetia 10 mg per day disp 90 refill x 1  And     Recheck lipid panel in 3 months  Dx HLD Rest of blood tests are good

## 2016-09-15 NOTE — Telephone Encounter (Signed)
Ok Please send in pravastatin 20 mg 1 by mouth daily dispense 90 refill 1.  May want to make sure she is taking vitamin D at 1000 units a day because sometimes a low vitamin D level gets more muscle aches.

## 2016-09-18 ENCOUNTER — Other Ambulatory Visit: Payer: Self-pay | Admitting: Emergency Medicine

## 2016-09-18 MED ORDER — PRAVASTATIN SODIUM 20 MG PO TABS: 20.0000 mg | ORAL_TABLET | Freq: Every day | ORAL | 1 refills | Status: DC

## 2016-09-20 ENCOUNTER — Ambulatory Visit (INDEPENDENT_AMBULATORY_CARE_PROVIDER_SITE_OTHER)
Admission: RE | Admit: 2016-09-20 | Discharge: 2016-09-20 | Disposition: A | Discharge status: Home / Self Care | Payer: Medicare Other | Source: Ambulatory Visit | Attending: Internal Medicine | Admitting: Internal Medicine

## 2016-09-20 DIAGNOSIS — E2839 Other primary ovarian failure: Secondary | ICD-10-CM | POA: Diagnosis not present

## 2016-09-21 ENCOUNTER — Other Ambulatory Visit: Payer: Self-pay | Admitting: Emergency Medicine

## 2016-09-21 DIAGNOSIS — E7889 Other lipoprotein metabolism disorders: Secondary | ICD-10-CM

## 2016-10-06 ENCOUNTER — Other Ambulatory Visit: Payer: Self-pay | Admitting: Internal Medicine

## 2016-10-24 ENCOUNTER — Other Ambulatory Visit (INDEPENDENT_AMBULATORY_CARE_PROVIDER_SITE_OTHER): Payer: Medicare Other

## 2016-10-24 DIAGNOSIS — E7889 Other lipoprotein metabolism disorders: Secondary | ICD-10-CM

## 2016-10-24 LAB — LIPID PANEL
CHOL/HDL RATIO: 4
Cholesterol: 238 mg/dL — ABNORMAL HIGH (ref 0–200)
HDL: 56.6 mg/dL (ref >39.00)
LDL Cholesterol: 150 mg/dL — ABNORMAL HIGH (ref 0–99)
NonHDL: 181.37
TRIGLYCERIDES: 157 mg/dL — AB (ref 0.0–149.0)
VLDL: 31.4 mg/dL (ref 0.0–40.0)

## 2016-10-24 LAB — CALCIUM: Calcium: 10.3 mg/dL (ref 8.4–10.5)

## 2016-11-03 ENCOUNTER — Telehealth: Payer: Self-pay | Admitting: Internal Medicine

## 2016-11-03 NOTE — Telephone Encounter (Signed)
Christy Young pt returned your call °

## 2016-11-03 NOTE — Telephone Encounter (Signed)
Left a VM for pt to give the office a call back  

## 2016-12-19 ENCOUNTER — Other Ambulatory Visit: Payer: Medicare Other

## 2016-12-24 ENCOUNTER — Other Ambulatory Visit: Payer: Self-pay | Admitting: Internal Medicine

## 2017-03-16 ENCOUNTER — Encounter: Payer: Self-pay | Admitting: Internal Medicine

## 2017-03-21 ENCOUNTER — Other Ambulatory Visit (INDEPENDENT_AMBULATORY_CARE_PROVIDER_SITE_OTHER): Payer: Medicare Other

## 2017-03-21 ENCOUNTER — Encounter: Payer: Self-pay | Admitting: Internal Medicine

## 2017-03-21 DIAGNOSIS — E785 Hyperlipidemia, unspecified: Secondary | ICD-10-CM | POA: Diagnosis not present

## 2017-03-21 DIAGNOSIS — Z1231 Encounter for screening mammogram for malignant neoplasm of breast: Secondary | ICD-10-CM | POA: Diagnosis not present

## 2017-03-21 LAB — LIPID PANEL
Cholesterol: 235 mg/dL — ABNORMAL HIGH (ref 0–200)
HDL: 54.4 mg/dL
LDL Cholesterol: 157 mg/dL — ABNORMAL HIGH (ref 0–99)
NonHDL: 180.95
Total CHOL/HDL Ratio: 4
Triglycerides: 121 mg/dL (ref 0.0–149.0)
VLDL: 24.2 mg/dL (ref 0.0–40.0)

## 2017-04-11 ENCOUNTER — Ambulatory Visit (INDEPENDENT_AMBULATORY_CARE_PROVIDER_SITE_OTHER): Payer: Medicare Other | Admitting: *Deleted

## 2017-04-11 DIAGNOSIS — Z23 Encounter for immunization: Secondary | ICD-10-CM

## 2017-04-18 ENCOUNTER — Other Ambulatory Visit: Payer: Self-pay | Admitting: Internal Medicine

## 2017-04-19 NOTE — Telephone Encounter (Signed)
Patient would like a refill for medication. Last refill 12/25/2015. Last OV 09/06/2016

## 2017-04-20 NOTE — Telephone Encounter (Signed)
Ok x  6 mos

## 2017-09-09 ENCOUNTER — Other Ambulatory Visit: Payer: Self-pay | Admitting: Internal Medicine

## 2017-10-23 ENCOUNTER — Encounter: Payer: Self-pay | Admitting: Internal Medicine

## 2017-10-23 ENCOUNTER — Ambulatory Visit (INDEPENDENT_AMBULATORY_CARE_PROVIDER_SITE_OTHER): Payer: Medicare Other | Admitting: Internal Medicine

## 2017-10-23 VITALS — BP 124/82 | HR 81 | Temp 98.5°F | Wt 220.0 lb

## 2017-10-23 DIAGNOSIS — R7301 Impaired fasting glucose: Secondary | ICD-10-CM

## 2017-10-23 DIAGNOSIS — R52 Pain, unspecified: Secondary | ICD-10-CM

## 2017-10-23 DIAGNOSIS — M1990 Unspecified osteoarthritis, unspecified site: Secondary | ICD-10-CM | POA: Diagnosis not present

## 2017-10-23 DIAGNOSIS — L409 Psoriasis, unspecified: Secondary | ICD-10-CM | POA: Diagnosis not present

## 2017-10-23 DIAGNOSIS — E785 Hyperlipidemia, unspecified: Secondary | ICD-10-CM | POA: Diagnosis not present

## 2017-10-23 DIAGNOSIS — Z79899 Other long term (current) drug therapy: Secondary | ICD-10-CM | POA: Diagnosis not present

## 2017-10-23 DIAGNOSIS — I10 Essential (primary) hypertension: Secondary | ICD-10-CM | POA: Diagnosis not present

## 2017-10-23 DIAGNOSIS — Z8739 Personal history of other diseases of the musculoskeletal system and connective tissue: Secondary | ICD-10-CM

## 2017-10-23 DIAGNOSIS — E790 Hyperuricemia without signs of inflammatory arthritis and tophaceous disease: Secondary | ICD-10-CM | POA: Diagnosis not present

## 2017-10-23 DIAGNOSIS — R221 Localized swelling, mass and lump, neck: Secondary | ICD-10-CM | POA: Diagnosis not present

## 2017-10-23 LAB — BASIC METABOLIC PANEL
BUN: 13 mg/dL (ref 6–23)
CO2: 29 mEq/L (ref 19–32)
Calcium: 10.9 mg/dL — ABNORMAL HIGH (ref 8.4–10.5)
Chloride: 104 mEq/L (ref 96–112)
Creatinine, Ser: 0.71 mg/dL (ref 0.40–1.20)
GFR: 86.01 mL/min (ref >60.00)
Glucose, Bld: 83 mg/dL (ref 70–99)
Potassium: 4.4 mEq/L (ref 3.5–5.1)
SODIUM: 139 meq/L (ref 135–145)

## 2017-10-23 LAB — CBC WITH DIFFERENTIAL/PLATELET
BASOS ABS: 0.1 10*3/uL (ref 0.0–0.1)
BASOS PCT: 1 % (ref 0.0–3.0)
EOS ABS: 0.2 10*3/uL (ref 0.0–0.7)
Eosinophils Relative: 2.1 % (ref 0.0–5.0)
HCT: 39.6 % (ref 36.0–46.0)
Hemoglobin: 13.4 g/dL (ref 12.0–15.0)
LYMPHS ABS: 2.8 10*3/uL (ref 0.7–4.0)
Lymphocytes Relative: 37.4 % (ref 12.0–46.0)
MCHC: 33.9 g/dL (ref 30.0–36.0)
MCV: 90.1 fl (ref 78.0–100.0)
MONOS PCT: 7.9 % (ref 3.0–12.0)
Monocytes Absolute: 0.6 10*3/uL (ref 0.1–1.0)
NEUTROS ABS: 3.8 10*3/uL (ref 1.4–7.7)
NEUTROS PCT: 51.6 % (ref 43.0–77.0)
PLATELETS: 271 10*3/uL (ref 150.0–400.0)
RBC: 4.4 Mil/uL (ref 3.87–5.11)
RDW: 14 % (ref 11.5–15.5)
WBC: 7.4 10*3/uL (ref 4.0–10.5)

## 2017-10-23 LAB — POC URINALSYSI DIPSTICK (AUTOMATED)
BILIRUBIN UA: NEGATIVE
GLUCOSE UA: NEGATIVE
Ketones, UA: NEGATIVE
Leukocytes, UA: NEGATIVE
NITRITE UA: NEGATIVE
Protein, UA: NEGATIVE
RBC UA: NEGATIVE
Spec Grav, UA: >=1.03 — AB (ref 1.010–1.025)
Urobilinogen, UA: 0.2 E.U./dL
pH, UA: 5.5 (ref 5.0–8.0)

## 2017-10-23 LAB — LIPID PANEL
CHOL/HDL RATIO: 5
Cholesterol: 252 mg/dL — ABNORMAL HIGH (ref 0–200)
HDL: 52.3 mg/dL (ref >39.00)
LDL Cholesterol: 177 mg/dL — ABNORMAL HIGH (ref 0–99)
NonHDL: 200.15
TRIGLYCERIDES: 114 mg/dL (ref 0.0–149.0)
VLDL: 22.8 mg/dL (ref 0.0–40.0)

## 2017-10-23 LAB — HEPATIC FUNCTION PANEL
ALK PHOS: 56 U/L (ref 39–117)
ALT: 23 U/L (ref 0–35)
AST: 15 U/L (ref 0–37)
Albumin: 4.3 g/dL (ref 3.5–5.2)
BILIRUBIN DIRECT: 0.1 mg/dL (ref 0.0–0.3)
BILIRUBIN TOTAL: 0.7 mg/dL (ref 0.2–1.2)
TOTAL PROTEIN: 6.7 g/dL (ref 6.0–8.3)

## 2017-10-23 LAB — SEDIMENTATION RATE: Sed Rate: 25 mm/hr (ref 0–30)

## 2017-10-23 LAB — URIC ACID: Uric Acid, Serum: 6.9 mg/dL (ref 2.4–7.0)

## 2017-10-23 LAB — HEMOGLOBIN A1C: Hgb A1c MFr Bld: 5.8 % (ref 4.6–6.5)

## 2017-10-23 LAB — C-REACTIVE PROTEIN: CRP: 0.6 mg/dL (ref 0.5–20.0)

## 2017-10-23 MED ORDER — PRAVASTATIN SODIUM 20 MG PO TABS: 20.0000 mg | ORAL_TABLET | Freq: Every day | ORAL | 1 refills | Status: DC

## 2017-10-23 NOTE — Patient Instructions (Addendum)
Will do re referral to dr D .  Get x ray of  Clavicular  Sternal joint.   Stay on same meds for now.     Psoriasis Psoriasis is a long-term (chronic) condition of skin inflammation. It occurs because your immune system causes skin cells to form too quickly. As a result, too many skin cells grow and create raised, red patches (plaques) that look silvery on your skin. Plaques may appear anywhere on your body. They can be any size or shape. Psoriasis can come and go. The condition varies from mild to very severe. It cannot be passed from one person to another (not contagious). What are the causes? The cause of psoriasis is not known, but certain factors can make the condition worse. These include:  Damage or trauma to the skin, such as cuts, scrapes, sunburn, and dryness.  Lack of sunlight.  Certain medicines.  Alcohol.  Tobacco use.  Stress.  Infections caused by bacteria or viruses.  What increases the risk? This condition is more likely to develop in:  People with a family history of psoriasis.  People who are Caucasian.  People who are between the ages of 15-1 and 30-66 years old.  What are the signs or symptoms? There are five different types of psoriasis. You can have more than one type of psoriasis during your life. Types are:  Plaque.  Guttate.  Inverse.  Pustular.  Erythrodermic.  Each type of psoriasis has different symptoms.  Plaque psoriasis symptoms include red, raised plaques with a silvery white coating (scale). These plaques may be itchy. Your nails may be pitted and crumbly or fall off.  Guttate psoriasis symptoms include small red spots that often show up on your trunk, arms, and legs. These spots may develop after you have been sick, especially with strep throat.  Inverse psoriasis symptoms include plaques in your underarm area, under your breasts, or on your genitals, groin, or buttocks.  Pustular psoriasis symptoms include pus-filled bumps  that are painful, red, and swollen on the palms of your hands or the soles of your feet. You also may feel exhausted, feverish, weak, or have no appetite.  Erythrodermic psoriasis symptoms include bright red skin that may look burned. You may have a fast heartbeat and a body temperature that is too high or too low. You may be itchy or in pain.  How is this diagnosed? Your health care provider may suspect psoriasis based on your symptoms and family history. Your health care provider will also do a physical exam. This may include a procedure to remove a tissue sample (biopsy) for testing. You may also be referred to a health care provider who specializes in skin diseases (dermatologist). How is this treated? There is no cure for this condition, but treatment can help manage it. Goals of treatment include:  Helping your skin heal.  Reducing itching and inflammation.  Slowing the growth of new skin cells.  Helping your immune system respond better to your skin.  Treatment varies, depending on the severity of your condition. Treatment may include:  Creams or ointments.  Ultraviolet ray exposure (light therapy). This may include natural sunlight or light therapy in a medical office.  Medicines (systemic therapy). These medicines can help your body better manage skin cell turnover and inflammation. They may be used along with light therapy or ointments. You may also get antibiotic medicines if you have an infection.  Follow these instructions at home: [place] your skin as needed. Only  use moisturizers that have been approved by your health care provider.  Apply cool compresses to the affected areas.  Do not scratch your skin. Lifestyle   Do not use tobacco products. This includes cigarettes, chewing tobacco, and e-cigarettes. If you need help quitting, ask your health care provider.  Drink little or no alcohol.  Try techniques for stress reduction, such as meditation or  yoga.  Get exposure to the sun as told by your health care provider. Do not get sunburned.  Consider joining a psoriasis support group. Medicines  Take or use over-the-counter and prescription medicines only as told by your health care provider.  If you were prescribed an antibiotic, take or use it as told by your health care provider. Do not stop taking the antibiotic even if your condition starts to improve. General instructions  Keep a journal to help track what triggers an outbreak. Try to avoid any triggers.  See a counselor or social worker if feelings of sadness, frustration, and hopelessness about your condition are interfering with your work and relationships.  Keep all follow-up visits as told by your health care provider. This is important. Contact a health care provider if:  Your pain gets worse.  You have increasing redness or warmth in the affected areas.  You have new or worsening pain or stiffness in your joints.  Your nails start to break easily or pull away from the nail bed.  You have a fever.  You feel depressed. This information is not intended to replace advice given to you by your health care provider. Make sure you discuss any questions you have with your health care provider. Document Released: 06/09/2000 Document Revised: 11/18/2015 Document Reviewed: 10/28/2014 Elsevier Interactive Patient Education  2018 [employer].

## 2017-10-23 NOTE — Progress Notes (Signed)
Chief Complaint  Patient presents with  . Medication Management    belives bystolic may be causing her psoriasis to worsen, complains of constain fatigue, has a "lump" near the right collar bone, noticed it 3 weeks ago    HPI: Christy Young 72 y.o. come in for Chronic disease management  Yearly visit   Feels fallnig apart  Mom in hospice but doing ok   LIPID:   Pravastatin  Taking as best possible   Need refill.   frind passed away so trying to take med   hh of 1   Worried about psoriasis   Saw rheum    2 years ago and was supposed to come back but hasnt yet   And /oraiasis   Elbows and hair issues .  Want som type of rx  achiness   Itchy scalp  Wonders isf bp med could do this  At end of list of meds that could do this?   Noted soreness right neck area and recently  Lump  Right anterior neck chest    Person who  Had lymphoma started with  Lump in neck   She has noted over weeks ? No fever sweats falling   Taking 100 mg allopurinol for uric acid no gout attacks    No tobacco but  wine  At night .  ROS: See pertinent positives and negatives per HPI. No cp sob fall injury fever   Past Medical History:  Diagnosis Date  . ARTHRITIS   . Carpal tunnel syndrome 08/23/2015   Left  . Fatigue   . Gout, unspecified   . Hyperlipidemia   . Hypertension    meds since age 80   . Hyperuricemia   . Nonspecific abnormal electrocardiogram (ECG) (EKG)    t wave non acute    . OBESITY   . Palpitations    hospitalized 2005 felt from stress neg cards eval  . 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 Hypertension Mother   . Arthritis Mother   . Alcohol abuse Father        deceased  . Diabetes Father   . Lymphoma Sister        non hodgkins  . Heart attack Son 34       1999  . Lung cancer Brother     Social History   Socioeconomic History  . Marital status: Single    Spouse name: Not on file  . Number of  children: Not on file  . Years of education: Not on file  . Highest education level: Not on file  Occupational History  . Not on file  Social Needs  . Financial resource strain: Not on file  . Food insecurity:    Worry: Not on file    Inability: Not on file  . Transportation needs:    Medical: Not on file    Non-medical: Not on file  Tobacco Use  . Smoking status: Never Smoker  . Smokeless tobacco: Never Used  Substance and Sexual Activity  . Alcohol use: Yes    Comment: qo pm may drink a glass of wine   . Drug use: No  . Sexual activity: Not on file  Lifestyle  . Physical activity:    Days per week: Not on file    Minutes per session: Not on file  . Stress: Not on file  Relationships  . Social connections:  Talks on phone: Not on file    Gets together: Not on file    Attends religious service: Not on file    Active member of club or organization: Not on file    Attends meetings of clubs or organizations: Not on file    Relationship status: Not on file  Other Topics Concern  . Not on file  Social History Narrative   Occupation: retired Event organiser, 3 yrs of college   Bereaved parent   [place] in January 12, 2023    No pets   [id number]   Sis died of cancer lymphoma 45  and bro now with lung cancer and spread.died 2023-06-28.    Outpatient Medications Prior to Visit  Medication Sig Dispense Refill  . allopurinol (ZYLOPRIM) 100 MG tablet TAKE 2 TABLETS (=200 MG)   DAILY 180 tablet 1  . BYSTOLIC 5 MG tablet TAKE 1 TABLET DAILY 90 tablet 1  . Omega-3 Fatty Acids (FISH OIL) 1000 MG CPDR Take by mouth.    . pravastatin (PRAVACHOL) 20 MG tablet Take 1 tablet (20 mg total) by mouth daily. 90 tablet 1   No facility-administered medications prior to visit.      EXAM:  BP 124/82 (BP Location: Left Arm, Patient Position: Sitting, Cuff Size: Large)   Pulse 81   Temp 98.5 F (36.9 C) (Oral)   Wt 220 lb (99.8 kg)   BMI 39.60 kg/m   Body mass index is 39.6  kg/m.  GENERAL: vitals reviewed and listed above, alert, oriented, appears well hydrated and in no acute distress well groomed and talkative  HEENT: atraumatic, conjunctiva  clear, no obvious abnormalities on inspection of external nose and earstm clear OP : no lesion edema or exudate  NECK: no obvious masses on inspection palpation  No adneopathy  LUNGS: clear to auscultation bilaterally, no wheezes, rales or rhonchi, good air movement prominet bony ? Swelling at right sternocalvicular joint  No bony point tenderness  No [place] fullness  ? Tight trapezious  CV: HRRR, no clubbing cyanosis or  peripheral edema nl cap refill  MS: moves all extremities without noticeable focal  Abnormality has some arthritis changes  Hands  mtp joint no redness  Skin no petechia  Flexor surface of elbows   Some minima;l patches    Hair line scalp flaky and thinning hair   But no  Other plaque or scarring alopecia?  PSYCH: pleasant and cooperative,   BP Readings from Last 3 Encounters:  10/23/17 124/82  09/06/16 120/80  08/01/16 (!) 142/82    ASSESSMENT AND PLAN:  Discussed the following assessment and plan:  Essential hypertension - Plan: Basic metabolic panel, CBC with Differential/Platelet, Hemoglobin A1c, Lipid panel, Hepatic function panel, Uric Acid, Sedimentation rate, C-reactive protein, ANA, TSH, POCT Urinalysis Dipstick (Automated)  Hyperlipidemia, unspecified hyperlipidemia type - Plan: Basic metabolic panel, CBC with Differential/Platelet, Hemoglobin A1c, Lipid panel, Hepatic function panel, Uric Acid, Sedimentation rate, C-reactive protein, ANA, TSH, POCT Urinalysis Dipstick (Automated)  Hyperuricemia - Plan: Basic metabolic panel, CBC with Differential/Platelet, Hemoglobin A1c, Lipid panel, Hepatic function panel, Uric Acid, Sedimentation rate, C-reactive protein, ANA, TSH, POCT Urinalysis Dipstick (Automated)  Fasting hyperglycemia - Plan: Basic metabolic panel, CBC with Differential/Platelet,  Hemoglobin A1c, Lipid panel, Hepatic function panel, Uric Acid, Sedimentation rate, C-reactive protein, ANA, TSH, POCT Urinalysis Dipstick (Automated)  Medication management - Plan: Basic metabolic panel, CBC with Differential/Platelet, Hemoglobin A1c, Lipid panel, Hepatic function panel, Uric Acid, Sedimentation rate, C-reactive protein, ANA, TSH, POCT  Urinalysis Dipstick (Automated)  H/O: gout - Plan: Basic metabolic panel, CBC with Differential/Platelet, Hemoglobin A1c, Lipid panel, Hepatic function panel, Uric Acid, Sedimentation rate, C-reactive protein, ANA, TSH, POCT Urinalysis Dipstick (Automated)  Body aches - Plan: Basic metabolic panel, CBC with Differential/Platelet, Hemoglobin A1c, Lipid panel, Hepatic function panel, Uric Acid, Sedimentation rate, C-reactive protein, ANA, TSH, POCT Urinalysis Dipstick (Automated), Ambulatory referral to Rheumatology  Psoriasis - Plan: Basic metabolic panel, CBC with Differential/Platelet, Hemoglobin A1c, Lipid panel, Hepatic function panel, Uric Acid, Sedimentation rate, C-reactive protein, ANA, TSH, POCT Urinalysis Dipstick (Automated), Ambulatory referral to Rheumatology  Localized swelling, mass and lump, neck - Plan: DG Sternum, DG Clavicle Right, Ambulatory referral to Rheumatology  Arthritis - Plan: Ambulatory referral to Rheumatology X ray [place] joint area sternum and clavicle  refer back to rheum dr D.   Consider further imagine of tht joint or  opiion from  Rheum   This is not a Ln mass etc dont really think the bp med causing her sx  Psoriasis  No change for now.  -Patient advised to return or notify health care team  if  new concerns arise.  Patient Instructions  Will do re referral to dr D .  Get x ray of  Clavicular  Sternal joint.   Stay on same meds for now.     Psoriasis Psoriasis is a long-term (chronic) condition of skin inflammation. It occurs because your immune system causes skin cells to form too quickly. As a result, too  many skin cells grow and create raised, red patches (plaques) that look silvery on your skin. Plaques may appear anywhere on your body. They can be any size or shape. Psoriasis can come and go. The condition varies from mild to very severe. It cannot be passed from one person to another (not contagious). What are the causes? The cause of psoriasis is not known, but certain factors can make the condition worse. These include:  Damage or trauma to the skin, such as cuts, scrapes, sunburn, and dryness.  Lack of sunlight.  Certain medicines.  Alcohol.  Tobacco use.  Stress.  Infections caused by bacteria or viruses.  What increases the risk? This condition is more likely to develop in:  People with a family history of psoriasis.  People who are Caucasian.  People who are between the ages of 15-31 and 52-86 years old.  What are the signs or symptoms? There are five different types of psoriasis. You can have more than one type of psoriasis during your life. Types are:  Plaque.  Guttate.  Inverse.  Pustular.  Erythrodermic.  Each type of psoriasis has different symptoms.  Plaque psoriasis symptoms include red, raised plaques with a silvery white coating (scale). These plaques may be itchy. Your nails may be pitted and crumbly or fall off.  Guttate psoriasis symptoms include small red spots that often show up on your trunk, arms, and legs. These spots may develop after you have been sick, especially with strep throat.  Inverse psoriasis symptoms include plaques in your underarm area, under your breasts, or on your genitals, groin, or buttocks.  Pustular psoriasis symptoms include pus-filled bumps that are painful, red, and swollen on the palms of your hands or the soles of your feet. You also may feel exhausted, feverish, weak, or have no appetite.  Erythrodermic psoriasis symptoms include bright red skin that may look burned. You may have a fast heartbeat and a body  temperature that is too high or too low. You  may be itchy or in pain.  How is this diagnosed? Your health care provider may suspect psoriasis based on your symptoms and family history. Your health care provider will also do a physical exam. This may include a procedure to remove a tissue sample (biopsy) for testing. You may also be referred to a health care provider who specializes in skin diseases (dermatologist). How is this treated? There is no cure for this condition, but treatment can help manage it. Goals of treatment include:  Helping your skin heal.  Reducing itching and inflammation.  Slowing the growth of new skin cells.  Helping your immune system respond better to your skin.  Treatment varies, depending on the severity of your condition. Treatment may include:  Creams or ointments.  Ultraviolet ray exposure (light therapy). This may include natural sunlight or light therapy in a medical office.  Medicines (systemic therapy). These medicines can help your body better manage skin cell turnover and inflammation. They may be used along with light therapy or ointments. You may also get antibiotic medicines if you have an infection.  Follow these instructions at home: [place] your skin as needed. Only use moisturizers that have been approved by your health care provider.  Apply cool compresses to the affected areas.  Do not scratch your skin. Lifestyle   Do not use tobacco products. This includes cigarettes, chewing tobacco, and e-cigarettes. If you need help quitting, ask your health care provider.  Drink little or no alcohol.  Try techniques for stress reduction, such as meditation or yoga.  Get exposure to the sun as told by your health care provider. Do not get sunburned.  Consider joining a psoriasis support group. Medicines  Take or use over-the-counter and prescription medicines only as told by your health care provider.  If you were  prescribed an antibiotic, take or use it as told by your health care provider. Do not stop taking the antibiotic even if your condition starts to improve. General instructions  Keep a journal to help track what triggers an outbreak. Try to avoid any triggers.  See a counselor or social worker if feelings of sadness, frustration, and hopelessness about your condition are interfering with your work and relationships.  Keep all follow-up visits as told by your health care provider. This is important. Contact a health care provider if:  Your pain gets worse.  You have increasing redness or warmth in the affected areas.  You have new or worsening pain or stiffness in your joints.  Your nails start to break easily or pull away from the nail bed.  You have a fever.  You feel depressed. This information is not intended to replace advice given to you by your health care provider. Make sure you discuss any questions you have with your health care provider. Document Released: 06/09/2000 Document Revised: 11/18/2015 Document Reviewed: 10/28/2014 Elsevier Interactive Patient Education  2018 [place]. Urias Sheek M.D.

## 2017-10-24 LAB — TSH: TSH: 1.65 u[IU]/mL (ref 0.35–4.50)

## 2017-10-25 LAB — ANA: Anti Nuclear Antibody(ANA): NEGATIVE

## 2017-11-02 ENCOUNTER — Telehealth: Payer: Self-pay | Admitting: Internal Medicine

## 2017-11-02 NOTE — Addendum Note (Signed)
Addended by: Agnes Lawrence on: 11/02/2017 09:33 AM   Modules accepted: Orders

## 2017-11-02 NOTE — Telephone Encounter (Signed)
Copied from [place] [id number]. Topic: Quick Communication - Lab Results >> Nov 02, 2017  9:31 AM Agnes Lawrence, CMA wrote: Called patient to inform them of lab results. When patient returns call, triage nurse may disclose results.

## 2017-11-02 NOTE — Telephone Encounter (Signed)
Patient returning call.

## 2017-11-02 NOTE — Telephone Encounter (Signed)
Left message for pt to return call to office for lab results. See result note.  

## 2017-11-08 ENCOUNTER — Other Ambulatory Visit (INDEPENDENT_AMBULATORY_CARE_PROVIDER_SITE_OTHER): Payer: Medicare Other

## 2017-11-08 LAB — VITAMIN D 25 HYDROXY (VIT D DEFICIENCY, FRACTURES): VITD: 20.49 ng/mL — ABNORMAL LOW (ref 30.00–100.00)

## 2017-11-09 LAB — PTH, INTACT AND CALCIUM
CALCIUM: 10.5 mg/dL — AB (ref 8.6–10.4)
PTH: 272 pg/mL — AB (ref 14–64)

## 2017-11-15 ENCOUNTER — Other Ambulatory Visit: Payer: Self-pay | Admitting: Emergency Medicine

## 2017-11-15 DIAGNOSIS — E215 Disorder of parathyroid gland, unspecified: Secondary | ICD-10-CM

## 2017-12-31 ENCOUNTER — Other Ambulatory Visit: Payer: Self-pay | Admitting: Internal Medicine

## 2018-02-07 ENCOUNTER — Ambulatory Visit
Admission: RE | Admit: 2018-02-07 | Discharge: 2018-02-07 | Disposition: A | Discharge status: Home / Self Care | Payer: Medicare Other | Source: Ambulatory Visit | Attending: Endocrinology | Admitting: Endocrinology

## 2018-02-07 ENCOUNTER — Ambulatory Visit (INDEPENDENT_AMBULATORY_CARE_PROVIDER_SITE_OTHER): Payer: Medicare Other | Admitting: Endocrinology

## 2018-02-07 ENCOUNTER — Encounter: Payer: Self-pay | Admitting: Endocrinology

## 2018-02-07 DIAGNOSIS — M19011 Primary osteoarthritis, right shoulder: Secondary | ICD-10-CM | POA: Diagnosis not present

## 2018-02-07 DIAGNOSIS — Q740 Other congenital malformations of upper limb(s), including shoulder girdle: Secondary | ICD-10-CM | POA: Diagnosis not present

## 2018-02-07 DIAGNOSIS — E213 Hyperparathyroidism, unspecified: Secondary | ICD-10-CM | POA: Diagnosis not present

## 2018-02-07 MED ORDER — ERGOCALCIFEROL 1.25 MG (50000 UT) PO CAPS: 50000.0000 [IU] | ORAL_CAPSULE | ORAL | 0 refills | Status: DC

## 2018-02-07 NOTE — Progress Notes (Signed)
Subjective:    Patient ID: Christy Young, female    DOB: [birth year], 72 y.o.   MRN: [id number]  HPI Pt is referred by Dr Regis Bill, for hypercalcemia.  Pt was noted to have mild hypercalcemia in 2018 (it was normal in 2017).  she has never had osteoporosis, urolithiasis, thyroid probs, parathyroid probs, sarcoidosis, cancer, PUD, pancreatitis, depression, or bony fracture.  she does not take vitamin-D or A supplements.   Pt denies taking antacids, Li++, or HCTZ.  She has intermitt moderate pain at the lower back, but no assoc numbness.   Past Medical History:  Diagnosis Date  . ARTHRITIS   . Carpal tunnel syndrome 08/23/2015   Left  . Fatigue   . Gout, unspecified   . Hyperlipidemia   . Hypertension    meds since age 53   . Hyperuricemia   . Nonspecific abnormal electrocardiogram (ECG) (EKG)    t wave non acute    . OBESITY   . Palpitations    hospitalized 2005 felt from stress neg cards eval  . 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 ABDOMINAL HYSTERECTOMY    . FACIAL COSMETIC SURGERY  1980    Social History   Socioeconomic History  . Marital status: Single    Spouse name: Not on file  . Number of children: Not on file  . Years of education: Not on file  . Highest education level: Not on file  Occupational History  . Not on file  Social Needs  . Financial resource strain: Not on file  . Food insecurity:    Worry: Not on file    Inability: Not on file  . Transportation needs:    Medical: Not on file    Non-medical: Not on file  Tobacco Use  . Smoking status: Never Smoker  . Smokeless tobacco: Never Used  Substance and Sexual Activity  . Alcohol use: Yes    Comment: qo pm may drink a glass of wine   . Drug use: No  . Sexual activity: Not on file  Lifestyle  . Physical activity:    Days per week: Not on file    Minutes per session: Not on file  . Stress: Not on file  Relationships  .  Social connections:    Talks on phone: Not on file    Gets together: Not on file    Attends religious service: Not on file    Active member of club or organization: Not on file    Attends meetings of clubs or organizations: Not on file    Relationship status: Not on file  . Intimate partner violence:    Fear of current or ex partner: Not on file    Emotionally abused: Not on file    Physically abused: Not on file    Forced sexual activity: Not on file  Other Topics Concern  . Not on file  Social History Narrative   Occupation: retired Event organiser, 3 yrs of college   Bereaved parent   [place] in 12/30/2022    No pets   [id number]   Sis died of cancer lymphoma 19  and bro now with lung cancer and spread.died 2023-06-17.    Current Outpatient Medications on File Prior to Visit  Medication Sig Dispense Refill  . allopurinol (ZYLOPRIM) 100 MG tablet TAKE 2 TABLETS (=200 MG)   DAILY 180 tablet 0  .  BYSTOLIC 5 MG tablet TAKE 1 TABLET DAILY 90 tablet 1  . Omega-3 Fatty Acids (FISH OIL) 1000 MG CPDR Take by mouth.    . pravastatin (PRAVACHOL) 20 MG tablet Take 1 tablet (20 mg total) by mouth daily. 90 tablet 1   No current facility-administered medications on file prior to visit.     Allergies  Allergen Reactions  . Pravastatin Other (See Comments)    Body MS aches and pain s  . Rosuvastatin     REACTION: leg cramps.  . Penicillins Other (See Comments)    Patient  says she is not allergic to amoxicillin or augmentin   Has taken these wo problem ? Of SE when she was 69  Not sever     Family History  Problem Relation Age of Onset  . Hypertension Mother   . Arthritis Mother   . Alcohol abuse Father        deceased  . Diabetes Father   . Lymphoma Sister        non hodgkins  . Heart attack Son 62       1999  . Lung cancer Brother   . Hyperparathyroidism Neg Hx     BP (!) 136/92 (BP Location: Left Arm, Patient Position: Sitting, Cuff Size: Normal)   Pulse 80   Ht 5\' 3"  (1.6  m)   Wt 216 lb 12.8 oz (98.3 kg)   SpO2 95%   BMI 38.40 kg/m    Review of Systems denies weight loss, galactorrhea, hematuria, memory loss, abdominal pain, muscle weakness, urinary frequency, hypoglycemia, visual loss, sob, diarrhea, rhinorrhea, easy bruising, and depression.  She has arthralgias.  No change in skin rash (psoriasis)     Objective:   Physical Exam VS: see vs page GEN: no distress HEAD: head: no deformity eyes: no periorbital swelling, no proptosis external nose and ears are normal mouth: no lesion seen NECK: supple, thyroid is not enlarged CHEST WALL: no deformity.  Right clavicular head is prominent LUNGS: clear to auscultation CV: reg rate and rhythm, no murmur.   ABD: abdomen is soft, nontender.  no hepatosplenomegaly.  not distended.  no hernia MUSCULOSKELETAL: muscle bulk and strength are grossly normal.  no obvious joint swelling.  gait is normal and steady EXTEMITIES: no deformity.  no ulcer on the feet.  feet are of normal color and temp.  Trace bilat leg edema.  Old healed surgical scar at the left foot. PULSES: dorsalis pedis intact bilat.  no carotid bruit NEURO:  cn 2-12 grossly intact.   readily moves all 4's.  sensation is intact to touch on the feet SKIN:  Normal texture and temperature.  No rash or suspicious lesion is visible, except dry skin on the scalp.    NODES:  None palpable at the neck PSYCH: alert, well-oriented.  Does not appear anxious nor depressed.    Lab Results  Component Value Date   PTH 272 (H) 11/08/2017   CALCIUM 10.5 (H) 11/08/2017   25-OH vit-D=20  DEXA (2018): normal  I have reviewed outside records, and summarized: Pt was noted to have elevated PTH, and referred here.  Other probs addressed inclided hyperglycemia, OA, gout, and hyperglycemia      Assessment & Plan:  Hyperparathyroidism, new.  prob primary, but PTH is out of proportion to Ca++ Vit-D def: we have to normalize this, in order to get a proper  assessment of PTH Low-back pain: uncertain etiology Clavicular prominence: usually physiologic.  Patient Instructions  Your blood pressure  is high today.  Please see your primary care provider soon, to have it rechecked X-rays are requested for you today.  We'll let you know about the results.   I have sent a prescription to your pharmacy, for the vitamin-D. When you have taken the 10 pills, you are finished. Then please redo the blood tests in 1 month. Then we will know more--i'll let you now

## 2018-02-07 NOTE — Patient Instructions (Addendum)
Your blood pressure is high today.  Please see your primary care provider soon, to have it rechecked X-rays are requested for you today.  We'll let you know about the results.   I have sent a prescription to your pharmacy, for the vitamin-D. When you have taken the 10 pills, you are finished. Then please redo the blood tests in 1 month. Then we will know more--i'll let you now

## 2018-02-09 DIAGNOSIS — E213 Hyperparathyroidism, unspecified: Secondary | ICD-10-CM | POA: Insufficient documentation

## 2018-03-04 ENCOUNTER — Other Ambulatory Visit (INDEPENDENT_AMBULATORY_CARE_PROVIDER_SITE_OTHER): Payer: Medicare Other

## 2018-03-04 DIAGNOSIS — E213 Hyperparathyroidism, unspecified: Secondary | ICD-10-CM

## 2018-03-04 LAB — VITAMIN D 25 HYDROXY (VIT D DEFICIENCY, FRACTURES): VITD: 27.2 ng/mL — AB (ref 30.00–100.00)

## 2018-03-05 ENCOUNTER — Other Ambulatory Visit: Payer: Self-pay | Admitting: Endocrinology

## 2018-03-05 DIAGNOSIS — E213 Hyperparathyroidism, unspecified: Secondary | ICD-10-CM

## 2018-03-05 LAB — PTH, INTACT AND CALCIUM
Calcium: 10.6 mg/dL — ABNORMAL HIGH (ref 8.6–10.4)
PTH: 172 pg/mL — ABNORMAL HIGH (ref 14–64)

## 2018-03-05 MED ORDER — ERGOCALCIFEROL 1.25 MG (50000 UT) PO CAPS: 50000.0000 [IU] | ORAL_CAPSULE | ORAL | 0 refills | Status: DC

## 2018-03-06 ENCOUNTER — Telehealth: Payer: Self-pay | Admitting: Internal Medicine

## 2018-03-06 NOTE — Telephone Encounter (Signed)
Copied from [place] [phone]. Topic: General - Other >> Mar 06, 2018  9:17 AM Keene Breath wrote: Reason for CRM: Patient called to get lab results.  She stated that her My Chart is not working and she wanted to speak with someone about the results.  CB# [phone].

## 2018-03-20 ENCOUNTER — Other Ambulatory Visit: Payer: Self-pay | Admitting: Internal Medicine

## 2018-03-27 ENCOUNTER — Other Ambulatory Visit: Payer: Self-pay | Admitting: Endocrinology

## 2018-05-14 DIAGNOSIS — Z1231 Encounter for screening mammogram for malignant neoplasm of breast: Secondary | ICD-10-CM | POA: Diagnosis not present

## 2018-05-14 LAB — HM MAMMOGRAPHY

## 2018-06-18 ENCOUNTER — Encounter: Payer: Self-pay | Admitting: Internal Medicine

## 2018-07-12 ENCOUNTER — Encounter: Payer: Self-pay | Admitting: Internal Medicine

## 2018-10-01 NOTE — Progress Notes (Signed)
Virtual Visit via Video Note  I connected with@ on 10/02/18 at  9:45 AM EDT by a video enabled telemedicine application and verified that I am speaking with the correct person using two identifiers. Location patient: home Location provider:work  office Persons participating in the virtual visit: patient, provider  [place] national recommendations  regarding COVID 19 pandemic   video visit is advised over in office visit for this patient.  Discussed the limitations of evaluation and management by telemedicine and  Lack of availability of in person appointments. The patient expressed understanding and agreed to proceed.   HPI: Christy Young  Last viisit  APril 2019  Video visit for follow-up of multiple medical issues.  She has hyperparathyroidism low vitamin D level elevatedcalcium and  blood pressure psoriasis joint aches and right lower extremity shooting pains at times.  She states that after having a bowel movement her right lower extremity excuse me left lower extremity feels better but she continues to try to be active denies any weakness in her leg.  Her left elbow starts to hurt and gets dark color and swells up at times.  She has been busy caretaking with her mom this last year who recently passed away early 2022/09/06 she had dementia.  Hypertension: Has not really checked her readings in quite a while but is been okay when she has had it checked.  Elevated calcium level and vitamin D.  She was supposed to follow-up with Dr. Loanne Drilling and ran out of her vitamin D prescription and has not had a follow-up lab test yet.  HLD: She has been only taking the pravastatin about 4 or 5 days a week because she wonders if it could be causing some of her muscle aches.  Psoriasis: She states that she is having outbreaks on her scalp and her elbows has seen a dermatologist a while back possibly 06-Sep-2014 in addition to her rheumatologist feels that she may need to go back.  Gout: She is taking  allopurinol no change.  ROS: See pertinent positives and negatives per HPI. No current cp sob  pulm cv sx new fever   Past Medical History:  Diagnosis Date  . ARTHRITIS   . Carpal tunnel syndrome 08/23/2015   Left  . Fatigue   . Gout, unspecified   . Hyperlipidemia   . Hypertension    meds since age 38   . Hyperuricemia   . Nonspecific abnormal electrocardiogram (ECG) (EKG)    t wave non acute    . OBESITY   . Palpitations    hospitalized 09-06-03 felt from stress neg cards eval  . 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 ABDOMINAL HYSTERECTOMY    . FACIAL COSMETIC SURGERY  1980    Family History  Problem Relation Age of Onset  . Hypertension Mother   . Arthritis Mother   . Alcohol abuse Father        deceased  . Diabetes Father   . Lymphoma Sister        non hodgkins  . Heart attack Son 77       1999  . Lung cancer Brother   . Hyperparathyroidism Neg Hx     SOCIAL HX:    Current Outpatient Medications:  .  allopurinol (ZYLOPRIM) 100 MG tablet, TAKE 2 TABLETS (=200 MG)   DAILY, Disp: 180 tablet, Rfl: 0 .  BYSTOLIC 5 MG tablet, TAKE 1 TABLET  DAILY, Disp: 90 tablet, Rfl: 1 .  Omega-3 Fatty Acids (FISH OIL) 1000 MG CPDR, Take by mouth., Disp: , Rfl:  .  pravastatin (PRAVACHOL) 20 MG tablet, Take 1 tablet (20 mg total) by mouth daily., Disp: 90 tablet, Rfl: 1 .  Vitamin D, Ergocalciferol, (DRISDOL) 50000 units CAPS capsule, TAKE ONE CAPSULE BY MOUTH TWO TIMES WEEKLY, Disp: 8 capsule, Rfl: 0  EXAM:  VITALS per patient if applicable:  GENERAL: alert, oriented, appears well and in no acute distress  HEENT: atraumatic, conjunttiva clear, no obvious abnormalities on inspection of external nose and ears  NECK: normal movements of the head and neck  LUNGS: on inspection no signs of respiratory distress, breathing rate appears normal, no obvious gross SOB, gasping or wheezing  CV: no obvious  cyanosis  MS: moves all visible extremities without noticeable abnormality shows me left elbow with some darkened  Swelling over ? Bursa with some  Rash( hrd to tell if typical of psoriasis)   Good rom left elbow less effects  PSYCH/NEURO: pleasant and cooperative, no obvious depression or anxiety, speech and thought processing grossly intact Lab Results  Component Value Date   WBC 7.4 10/23/2017   HGB 13.4 10/23/2017   HCT 39.6 10/23/2017   PLT 271.0 10/23/2017   GLUCOSE 83 10/23/2017   CHOL 252 (H) 10/23/2017   TRIG 114.0 10/23/2017   HDL 52.30 10/23/2017   LDLDIRECT 180.4 04/11/2013   LDLCALC 177 (H) 10/23/2017   ALT 23 10/23/2017   AST 15 10/23/2017   NA 139 10/23/2017   K 4.4 10/23/2017   CL 104 10/23/2017   CREATININE 0.71 10/23/2017   BUN 13 10/23/2017   CO2 29 10/23/2017   TSH 1.65 10/23/2017   HGBA1C 5.8 10/23/2017   MICROALBUR 1.4 12/01/2009   BP Readings from Last 3 Encounters:  02/07/18 (!) 136/92  10/23/17 124/82  09/06/16 120/80    ASSESSMENT AND PLAN:  Discussed the following assessment and plan:  Essential hypertension - Plan: Basic metabolic panel, CBC with Differential/Platelet, Hepatic function panel, Lipid panel, TSH, Uric acid, PTH, intact and calcium, VITAMIN D 25 Hydroxy (Vit-D Deficiency, Fractures), C-reactive protein, Sedimentation rate, CK  Parathyroid disorder ([place]) - Plan: Basic metabolic panel, CBC with Differential/Platelet, Hepatic function panel, Lipid panel, TSH, Uric acid, PTH, intact and calcium, VITAMIN D 25 Hydroxy (Vit-D Deficiency, Fractures), C-reactive protein, Sedimentation rate, CK  Hyperlipidemia, unspecified hyperlipidemia type - Plan: Basic metabolic panel, CBC with Differential/Platelet, Hepatic function panel, Lipid panel, TSH, Uric acid, PTH, intact and calcium, VITAMIN D 25 Hydroxy (Vit-D Deficiency, Fractures), C-reactive protein, Sedimentation rate, CK  Hypercalcemia - Plan: Basic metabolic panel, CBC with  Differential/Platelet, Hepatic function panel, Lipid panel, TSH, Uric acid, PTH, intact and calcium, VITAMIN D 25 Hydroxy (Vit-D Deficiency, Fractures), C-reactive protein, Sedimentation rate, CK  Hyperuricemia - Plan: Basic metabolic panel, CBC with Differential/Platelet, Hepatic function panel, Lipid panel, TSH, Uric acid, PTH, intact and calcium, VITAMIN D 25 Hydroxy (Vit-D Deficiency, Fractures), C-reactive protein, Sedimentation rate, CK  Fasting hyperglycemia - Plan: Basic metabolic panel, CBC with Differential/Platelet, Hepatic function panel, Lipid panel, TSH, Uric acid, PTH, intact and calcium, VITAMIN D 25 Hydroxy (Vit-D Deficiency, Fractures), C-reactive protein, Sedimentation rate, CK, Hemoglobin A1c  H/O: gout - Plan: Basic metabolic panel, CBC with Differential/Platelet, Hepatic function panel, Lipid panel, TSH, Uric acid, PTH, intact and calcium, VITAMIN D 25 Hydroxy (Vit-D Deficiency, Fractures), C-reactive protein, Sedimentation rate, CK  Psoriasis - Plan: Basic metabolic panel, CBC with Differential/Platelet, Hepatic function panel, Lipid panel,  TSH, Uric acid, PTH, intact and calcium, VITAMIN D 25 Hydroxy (Vit-D Deficiency, Fractures), C-reactive protein, Sedimentation rate, CK  Body aches - Plan: Basic metabolic panel, CBC with Differential/Platelet, Hepatic function panel, Lipid panel, TSH, Uric acid, PTH, intact and calcium, VITAMIN D 25 Hydroxy (Vit-D Deficiency, Fractures), C-reactive protein, Sedimentation rate, CK  Hyperparathyroidism ([facility]) - Plan: Basic metabolic panel, CBC with Differential/Platelet, Hepatic function panel, Lipid panel, TSH, Uric acid, PTH, intact and calcium, VITAMIN D 25 Hydroxy (Vit-D Deficiency, Fractures), C-reactive protein, Sedimentation rate, CK  Vitamin D deficiency, unspecified - Plan: Basic metabolic panel, CBC with Differential/Platelet, Hepatic function panel, Lipid panel, TSH, Uric acid, PTH, intact and calcium, VITAMIN D 25 Hydroxy (Vit-D  Deficiency, Fractures), C-reactive protein, Sedimentation rate, CK  Medication management - Plan: Basic metabolic panel, CBC with Differential/Platelet, Hepatic function panel, Lipid panel, TSH, Uric acid, PTH, intact and calcium, VITAMIN D 25 Hydroxy (Vit-D Deficiency, Fractures), C-reactive protein, Sedimentation rate, CK, Hemoglobin A1c Vit d low  Make lab appt   And then go from there  Suspect left leg sx are radicular and the other sx could be  arthritis  Left elbow ? Bursitis vs other and psoriatic arthritis possible.  Remote hx of rheum and  Derm appts  But HPTH could cause many sx also .  She ran out of vit d suppp so will check vit d also Her barrier to care in prev was ill mom  Caretaker who had dementia and passed   Early March 20 Advised she check her bp readings when possible  To ensure below 140/90  Will plan fu depending o nlasbs   She has dec her prava in case adding to  Sx  But uncertain at this time  ( 5 days per week)  Expectant management and discussion of plan and treatment with patient with opportunity to ask questions and all were answered. The patient agreed with the plan will be in touch for next step and send results to dr Loanne Drilling who may need to have a video visit with her   The patient was advised to call back or seek an in-person evaluation if worsening having concerns    or if the condition fails to improve as anticipated. In the interim    Shanon Ace, MD

## 2018-10-02 ENCOUNTER — Encounter: Payer: Self-pay | Admitting: Internal Medicine

## 2018-10-02 ENCOUNTER — Other Ambulatory Visit: Payer: Self-pay

## 2018-10-02 ENCOUNTER — Ambulatory Visit (INDEPENDENT_AMBULATORY_CARE_PROVIDER_SITE_OTHER): Payer: Medicare Other | Admitting: Internal Medicine

## 2018-10-02 DIAGNOSIS — L409 Psoriasis, unspecified: Secondary | ICD-10-CM

## 2018-10-02 DIAGNOSIS — R7301 Impaired fasting glucose: Secondary | ICD-10-CM | POA: Diagnosis not present

## 2018-10-02 DIAGNOSIS — Z8739 Personal history of other diseases of the musculoskeletal system and connective tissue: Secondary | ICD-10-CM | POA: Diagnosis not present

## 2018-10-02 DIAGNOSIS — E785 Hyperlipidemia, unspecified: Secondary | ICD-10-CM | POA: Diagnosis not present

## 2018-10-02 DIAGNOSIS — I10 Essential (primary) hypertension: Secondary | ICD-10-CM | POA: Diagnosis not present

## 2018-10-02 DIAGNOSIS — E559 Vitamin D deficiency, unspecified: Secondary | ICD-10-CM | POA: Diagnosis not present

## 2018-10-02 DIAGNOSIS — E213 Hyperparathyroidism, unspecified: Secondary | ICD-10-CM

## 2018-10-02 DIAGNOSIS — R52 Pain, unspecified: Secondary | ICD-10-CM

## 2018-10-02 DIAGNOSIS — Z79899 Other long term (current) drug therapy: Secondary | ICD-10-CM | POA: Diagnosis not present

## 2018-10-02 DIAGNOSIS — E790 Hyperuricemia without signs of inflammatory arthritis and tophaceous disease: Secondary | ICD-10-CM | POA: Diagnosis not present

## 2018-10-02 DIAGNOSIS — E215 Disorder of parathyroid gland, unspecified: Secondary | ICD-10-CM | POA: Diagnosis not present

## 2018-10-03 NOTE — Progress Notes (Signed)
Pt returned call. Informed pt of need to schedule an appt with Dr. Loanne Drilling. States she is planning to call Dr. Velora Mediate office to schedule an appt to complete lab work. Declined my offer to schedule an appt with Dr. Loanne Drilling. States she will call back to schedule an appt after blood work is complete.

## 2018-10-03 NOTE — Progress Notes (Signed)
LVM requesting returned call 

## 2018-10-07 ENCOUNTER — Telehealth: Payer: Self-pay | Admitting: *Deleted

## 2018-10-07 NOTE — Telephone Encounter (Signed)
Copied from [place] [id number]. Topic: General - Other >> Oct 04, 2018  9:07 AM Carolyn Stare wrote:  Pt left a VM on the [facility] appt line saying she need to schedule an appt for labs ,please call pt  [facility]mv letting pt know office is close today

## 2018-10-07 NOTE — Telephone Encounter (Signed)
Lab appt scheduled for wednesday

## 2018-10-09 ENCOUNTER — Other Ambulatory Visit: Payer: Self-pay

## 2018-10-09 ENCOUNTER — Ambulatory Visit: Payer: Medicare Other

## 2018-10-09 DIAGNOSIS — L409 Psoriasis, unspecified: Secondary | ICD-10-CM | POA: Diagnosis not present

## 2018-10-09 DIAGNOSIS — E785 Hyperlipidemia, unspecified: Secondary | ICD-10-CM | POA: Diagnosis not present

## 2018-10-09 DIAGNOSIS — E790 Hyperuricemia without signs of inflammatory arthritis and tophaceous disease: Secondary | ICD-10-CM | POA: Diagnosis not present

## 2018-10-09 DIAGNOSIS — E215 Disorder of parathyroid gland, unspecified: Secondary | ICD-10-CM | POA: Diagnosis not present

## 2018-10-09 DIAGNOSIS — E213 Hyperparathyroidism, unspecified: Secondary | ICD-10-CM | POA: Diagnosis not present

## 2018-10-09 DIAGNOSIS — Z79899 Other long term (current) drug therapy: Secondary | ICD-10-CM

## 2018-10-09 DIAGNOSIS — E559 Vitamin D deficiency, unspecified: Secondary | ICD-10-CM

## 2018-10-09 DIAGNOSIS — Z8739 Personal history of other diseases of the musculoskeletal system and connective tissue: Secondary | ICD-10-CM | POA: Diagnosis not present

## 2018-10-09 DIAGNOSIS — R52 Pain, unspecified: Secondary | ICD-10-CM

## 2018-10-09 DIAGNOSIS — R7301 Impaired fasting glucose: Secondary | ICD-10-CM | POA: Diagnosis not present

## 2018-10-09 DIAGNOSIS — I10 Essential (primary) hypertension: Secondary | ICD-10-CM

## 2018-10-09 LAB — CBC WITH DIFFERENTIAL/PLATELET
Basophils Absolute: 0.1 10*3/uL (ref 0.0–0.1)
Basophils Relative: 0.8 % (ref 0.0–3.0)
Eosinophils Absolute: 0.2 10*3/uL (ref 0.0–0.7)
Eosinophils Relative: 2.6 % (ref 0.0–5.0)
HCT: 40.2 % (ref 36.0–46.0)
Hemoglobin: 13.7 g/dL (ref 12.0–15.0)
Lymphocytes Relative: 41.1 % (ref 12.0–46.0)
Lymphs Abs: 3.3 10*3/uL (ref 0.7–4.0)
MCHC: 34 g/dL (ref 30.0–36.0)
MCV: 89.8 fl (ref 78.0–100.0)
Monocytes Absolute: 0.6 10*3/uL (ref 0.1–1.0)
Monocytes Relative: 8.2 % (ref 3.0–12.0)
Neutro Abs: 3.8 10*3/uL (ref 1.4–7.7)
Neutrophils Relative %: 47.3 % (ref 43.0–77.0)
Platelets: 264 10*3/uL (ref 150.0–400.0)
RBC: 4.48 Mil/uL (ref 3.87–5.11)
RDW: 13.8 % (ref 11.5–15.5)
WBC: 7.9 10*3/uL (ref 4.0–10.5)

## 2018-10-09 LAB — HEPATIC FUNCTION PANEL
ALT: 23 U/L (ref 0–35)
AST: 15 U/L (ref 0–37)
Albumin: 4.2 g/dL (ref 3.5–5.2)
Alkaline Phosphatase: 55 U/L (ref 39–117)
Bilirubin, Direct: 0.1 mg/dL (ref 0.0–0.3)
Total Bilirubin: 0.7 mg/dL (ref 0.2–1.2)
Total Protein: 6.7 g/dL (ref 6.0–8.3)

## 2018-10-09 LAB — URIC ACID: Uric Acid, Serum: 6.3 mg/dL (ref 2.4–7.0)

## 2018-10-09 LAB — BASIC METABOLIC PANEL
BUN: 13 mg/dL (ref 6–23)
CO2: 29 mEq/L (ref 19–32)
Calcium: 10.8 mg/dL — ABNORMAL HIGH (ref 8.4–10.5)
Chloride: 106 mEq/L (ref 96–112)
Creatinine, Ser: 0.67 mg/dL (ref 0.40–1.20)
GFR: 86.29 mL/min (ref >60.00)
Glucose, Bld: 94 mg/dL (ref 70–99)
Potassium: 4 mEq/L (ref 3.5–5.1)
Sodium: 142 mEq/L (ref 135–145)

## 2018-10-09 LAB — CK: Total CK: 43 U/L (ref 7–177)

## 2018-10-09 LAB — LIPID PANEL
Cholesterol: 233 mg/dL — ABNORMAL HIGH (ref 0–200)
HDL: 56.2 mg/dL (ref >39.00)
LDL Cholesterol: 152 mg/dL — ABNORMAL HIGH (ref 0–99)
NonHDL: 176.85
Total CHOL/HDL Ratio: 4
Triglycerides: 126 mg/dL (ref 0.0–149.0)
VLDL: 25.2 mg/dL (ref 0.0–40.0)

## 2018-10-09 LAB — SEDIMENTATION RATE: Sed Rate: 7 mm/hr (ref 0–30)

## 2018-10-09 LAB — C-REACTIVE PROTEIN: CRP: <1 mg/dL (ref 0.5–20.0)

## 2018-10-09 LAB — TSH: TSH: 3.74 u[IU]/mL (ref 0.35–4.50)

## 2018-10-09 LAB — VITAMIN D 25 HYDROXY (VIT D DEFICIENCY, FRACTURES): VITD: 22.42 ng/mL — ABNORMAL LOW (ref 30.00–100.00)

## 2018-10-09 LAB — HEMOGLOBIN A1C: Hgb A1c MFr Bld: 5.9 % (ref 4.6–6.5)

## 2018-10-10 LAB — PTH, INTACT AND CALCIUM
Calcium: 10.8 mg/dL — ABNORMAL HIGH (ref 8.6–10.4)
PTH: 227 pg/mL — ABNORMAL HIGH (ref 14–64)

## 2018-10-15 ENCOUNTER — Other Ambulatory Visit: Payer: Self-pay

## 2018-10-15 MED ORDER — ALLOPURINOL 100 MG PO TABS: ORAL_TABLET | ORAL | 0 refills | Status: DC

## 2018-10-16 ENCOUNTER — Encounter: Payer: Self-pay | Admitting: General Practice

## 2018-10-17 ENCOUNTER — Ambulatory Visit (INDEPENDENT_AMBULATORY_CARE_PROVIDER_SITE_OTHER): Payer: Medicare Other | Admitting: Endocrinology

## 2018-10-17 ENCOUNTER — Other Ambulatory Visit: Payer: Self-pay

## 2018-10-17 DIAGNOSIS — E213 Hyperparathyroidism, unspecified: Secondary | ICD-10-CM | POA: Diagnosis not present

## 2018-10-17 NOTE — Progress Notes (Signed)
Subjective:    Patient ID: Christy Young, female    DOB: 10-08-1945, 73 y.o.   MRN: [id number]  HPI telehealth visit today via doxy video visit.  Alternatives to telehealth are presented to this patient, and the patient agrees to the telehealth visit. Pt is advised of the cost of the visit, and agrees to this, also.   Patient is at home, and I am at the office.   Pt returns for f/u of hyperparathyroidism (prob a combination of primary and secondary; dx'ed 2018; DEXA in 2018 was normal; she has never had urolithiasis).  She has moderate pain at the lateral aspect of the left thigh, but no assoc numbness.  She takes vit-D, 2000 units/day, but she takes this intermittently.   Past Medical History:  Diagnosis Date  . ARTHRITIS   . Carpal tunnel syndrome 08/23/2015   Left  . Fatigue   . Gout, unspecified   . Hyperlipidemia   . Hypertension    meds since age 39   . Hyperuricemia   . Nonspecific abnormal electrocardiogram (ECG) (EKG)    t wave non acute    . OBESITY   . Palpitations    hospitalized 2005 felt from stress neg cards eval  . 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 ABDOMINAL HYSTERECTOMY    . FACIAL COSMETIC SURGERY  1980    Social History   Socioeconomic History  . Marital status: Single    Spouse name: Not on file  . Number of children: Not on file  . Years of education: Not on file  . Highest education level: Not on file  Occupational History  . Not on file  Social Needs  . Financial resource strain: Not on file  . Food insecurity:    Worry: Not on file    Inability: Not on file  . Transportation needs:    Medical: Not on file    Non-medical: Not on file  Tobacco Use  . Smoking status: Never Smoker  . Smokeless tobacco: Never Used  Substance and Sexual Activity  . Alcohol use: Yes    Comment: qo pm may drink a glass of wine   . Drug use: No  . Sexual activity: Not on file   Lifestyle  . Physical activity:    Days per week: Not on file    Minutes per session: Not on file  . Stress: Not on file  Relationships  . Social connections:    Talks on phone: Not on file    Gets together: Not on file    Attends religious service: Not on file    Active member of club or organization: Not on file    Attends meetings of clubs or organizations: Not on file    Relationship status: Not on file  . Intimate partner violence:    Fear of current or ex partner: Not on file    Emotionally abused: Not on file    Physically abused: Not on file    Forced sexual activity: Not on file  Other Topics Concern  . Not on file  Social History Narrative   Occupation: retired Event organiser, 3 yrs of college   Bereaved parent   [place] in 2023-01-16    No pets   G35P2   Sis died of cancer lymphoma 29  and bro now with lung cancer and spread.died 07-06-23.  Current Outpatient Medications on File Prior to Visit  Medication Sig Dispense Refill  . allopurinol (ZYLOPRIM) 100 MG tablet TAKE 2 TABLETS (=200 MG)   DAILY 180 tablet 0  . BYSTOLIC 5 MG tablet TAKE 1 TABLET DAILY 90 tablet 1  . Omega-3 Fatty Acids (FISH OIL) 1000 MG CPDR Take by mouth.    . pravastatin (PRAVACHOL) 20 MG tablet Take 1 tablet (20 mg total) by mouth daily. 90 tablet 1  . Vitamin D, Ergocalciferol, (DRISDOL) 50000 units CAPS capsule TAKE ONE CAPSULE BY MOUTH TWO TIMES WEEKLY 8 capsule 0   No current facility-administered medications on file prior to visit.     Allergies  Allergen Reactions  . Pravastatin Other (See Comments)    Body MS aches and pain s  . Rosuvastatin     REACTION: leg cramps.  . Penicillins Other (See Comments)    Patient  says she is not allergic to amoxicillin or augmentin   Has taken these wo problem ? Of SE when she was 52  Not sever     Family History  Problem Relation Age of Onset  . Hypertension Mother   . Arthritis Mother   . Alcohol abuse Father        deceased  .  Diabetes Father   . Lymphoma Sister        non hodgkins  . Heart attack Son 35       1999  . Lung cancer Brother   . Hyperparathyroidism Neg Hx     Review of Systems Denies muscle cramps.  No change in chronic arthralgias or fatigue.     Objective:   Physical Exam    Lab Results  Component Value Date   PTH 227 (H) 10/09/2018   CALCIUM 10.8 (H) 10/09/2018   CALCIUM 10.8 (H) 10/09/2018   25-OH vit-D=22    Assessment & Plan:  Vit-D def.  She needs increased rx.  Hyperparathyroidism: prob a combination of primary and secondary.  For the primary component, she has no indication for surgery.  Patient Instructions  Please take the vitamin-D, 2000 units every day.  If you miss a day, you can make it up the next day. Please come back for a follow-up appointment in 4 months.

## 2018-10-17 NOTE — Patient Instructions (Addendum)
Please take the vitamin-D, 2000 units every day.  If you miss a day, you can make it up the next day. Please come back for a follow-up appointment in 4 months.

## 2018-11-08 ENCOUNTER — Other Ambulatory Visit: Payer: Self-pay | Admitting: Internal Medicine

## 2019-01-07 ENCOUNTER — Other Ambulatory Visit: Payer: Self-pay | Admitting: Internal Medicine
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
## 2019-02-03 ENCOUNTER — Ambulatory Visit (INDEPENDENT_AMBULATORY_CARE_PROVIDER_SITE_OTHER): Payer: Medicare Other | Admitting: Internal Medicine

## 2019-02-03 ENCOUNTER — Encounter: Payer: Self-pay | Admitting: Internal Medicine

## 2019-02-03 VITALS — BP 128/82 | HR 95 | Temp 97.0°F | Ht 63.0 in | Wt 217.8 lb

## 2019-02-03 DIAGNOSIS — E215 Disorder of parathyroid gland, unspecified: Secondary | ICD-10-CM | POA: Diagnosis not present

## 2019-02-03 DIAGNOSIS — M2550 Pain in unspecified joint: Secondary | ICD-10-CM

## 2019-02-03 DIAGNOSIS — R7989 Other specified abnormal findings of blood chemistry: Secondary | ICD-10-CM

## 2019-02-03 DIAGNOSIS — J329 Chronic sinusitis, unspecified: Secondary | ICD-10-CM | POA: Diagnosis not present

## 2019-02-03 DIAGNOSIS — E785 Hyperlipidemia, unspecified: Secondary | ICD-10-CM

## 2019-02-03 DIAGNOSIS — M1000 Idiopathic gout, unspecified site: Secondary | ICD-10-CM | POA: Diagnosis not present

## 2019-02-03 DIAGNOSIS — M5410 Radiculopathy, site unspecified: Secondary | ICD-10-CM

## 2019-02-03 DIAGNOSIS — I10 Essential (primary) hypertension: Secondary | ICD-10-CM | POA: Diagnosis not present

## 2019-02-03 LAB — C-REACTIVE PROTEIN: CRP: <1 mg/dL (ref 0.5–20.0)

## 2019-02-03 LAB — CBC WITH DIFFERENTIAL/PLATELET
Basophils Absolute: 0.1 10*3/uL (ref 0.0–0.1)
Basophils Relative: 0.8 % (ref 0.0–3.0)
Eosinophils Absolute: 0.1 10*3/uL (ref 0.0–0.7)
Eosinophils Relative: 2 % (ref 0.0–5.0)
HCT: 40.9 % (ref 36.0–46.0)
Hemoglobin: 13.7 g/dL (ref 12.0–15.0)
Lymphocytes Relative: 39.3 % (ref 12.0–46.0)
Lymphs Abs: 2.7 10*3/uL (ref 0.7–4.0)
MCHC: 33.6 g/dL (ref 30.0–36.0)
MCV: 90.6 fl (ref 78.0–100.0)
Monocytes Absolute: 0.5 10*3/uL (ref 0.1–1.0)
Monocytes Relative: 8 % (ref 3.0–12.0)
Neutro Abs: 3.4 10*3/uL (ref 1.4–7.7)
Neutrophils Relative %: 49.9 % (ref 43.0–77.0)
Platelets: 292 10*3/uL (ref 150.0–400.0)
RBC: 4.52 Mil/uL (ref 3.87–5.11)
RDW: 13.3 % (ref 11.5–15.5)
WBC: 6.8 10*3/uL (ref 4.0–10.5)

## 2019-02-03 LAB — LIPID PANEL
Cholesterol: 253 mg/dL — ABNORMAL HIGH (ref 0–200)
HDL: 51.7 mg/dL (ref >39.00)
LDL Cholesterol: 176 mg/dL — ABNORMAL HIGH (ref 0–99)
NonHDL: 201.52
Total CHOL/HDL Ratio: 5
Triglycerides: 129 mg/dL (ref 0.0–149.0)
VLDL: 25.8 mg/dL (ref 0.0–40.0)

## 2019-02-03 LAB — VITAMIN D 25 HYDROXY (VIT D DEFICIENCY, FRACTURES): VITD: 26.15 ng/mL — ABNORMAL LOW (ref 30.00–100.00)

## 2019-02-03 LAB — URIC ACID: Uric Acid, Serum: 6 mg/dL (ref 2.4–7.0)

## 2019-02-03 LAB — HEPATIC FUNCTION PANEL
ALT: 29 U/L (ref 0–35)
AST: 17 U/L (ref 0–37)
Albumin: 4.4 g/dL (ref 3.5–5.2)
Alkaline Phosphatase: 65 U/L (ref 39–117)
Bilirubin, Direct: 0.1 mg/dL (ref 0.0–0.3)
Total Bilirubin: 0.7 mg/dL (ref 0.2–1.2)
Total Protein: 6.9 g/dL (ref 6.0–8.3)

## 2019-02-03 LAB — BASIC METABOLIC PANEL
BUN: 12 mg/dL (ref 6–23)
CO2: 26 mEq/L (ref 19–32)
Calcium: 11.2 mg/dL — ABNORMAL HIGH (ref 8.4–10.5)
Chloride: 105 mEq/L (ref 96–112)
Creatinine, Ser: 0.73 mg/dL (ref 0.40–1.20)
GFR: 78.09 mL/min (ref >60.00)
Glucose, Bld: 101 mg/dL — ABNORMAL HIGH (ref 70–99)
Potassium: 3.9 mEq/L (ref 3.5–5.1)
Sodium: 141 mEq/L (ref 135–145)

## 2019-02-03 LAB — HEMOGLOBIN A1C: Hgb A1c MFr Bld: 5.7 % (ref 4.6–6.5)

## 2019-02-03 LAB — TSH: TSH: 3.15 u[IU]/mL (ref 0.35–4.50)

## 2019-02-03 MED ORDER — AMOXICILLIN-POT CLAVULANATE 875-125 MG PO TABS: 1.0000 | ORAL_TABLET | Freq: Two times a day (BID) | ORAL | 0 refills | Status: DC

## 2019-02-03 MED ORDER — PRAVASTATIN SODIUM 20 MG PO TABS: 20.0000 mg | ORAL_TABLET | Freq: Every day | ORAL | 1 refills | Status: DC

## 2019-02-03 NOTE — Patient Instructions (Addendum)
Getting labs today  Will send results to dr Loanne Drilling  Back and  left leg pain seems like sciatic  And  Not from gout  May need to see a back  sm or ortho back specialist .   unceratin if  Psoriasis is related to you  Pains or the elevated PTH.  I will work on getting you back to rheumatologist   We can add antibiotic for sinus  But advise add saline nose spray to help clear them also .  Plan FU depending on lab an how doing    Less likely that bystolic causing the sinus problem but is possible to aggravate asthma.    Will refill pravastatin today  Consider other med or dosage change depending on result.

## 2019-02-04 LAB — PTH, INTACT AND CALCIUM
Calcium: 11.3 mg/dL — ABNORMAL HIGH (ref 8.6–10.4)
PTH: 122 pg/mL — ABNORMAL HIGH (ref 14–64)

## 2019-02-07 ENCOUNTER — Telehealth: Payer: Self-pay

## 2019-02-07 NOTE — Telephone Encounter (Signed)
-----   Message from Renato Shin, MD sent at 02/07/2019  2:52 PM EDT ----- please contact patient: F/u ov is due. ----- Message ----- From: Burnis Medin, MD Sent: 02/06/2019   6:35 PM EDT To: Renato Shin, MD, Grayling Congress, CMA  Calcium is still elevated and pth also  Your vit d level is still slightly low .   Cholesterol not as good  But you have been off the pravastatin so  hopefully will get better   Without side effect.   Sending results to Dr Loanne Drilling   For review and any intervnetion Christy Young  It is possible that your calcium levels are making you feel  Achy  . Will send notes to dr Dossie Der  The rheumatologist also  Make sure taking vit D but not calcium

## 2019-02-07 NOTE — Progress Notes (Signed)
Left message for patient to return our call at [phone].  

## 2019-02-18 ENCOUNTER — Telehealth: Payer: Self-pay | Admitting: Internal Medicine

## 2019-02-18 NOTE — Telephone Encounter (Signed)
Noted called the pt left msg to call  [facility] Address: [address], [place], [place] [address] Phone: [phone]  Copied from [place]. Topic: General - Other >> Feb 10, 2019 11:29 AM Erick Blinks wrote: Reason for CRM: Office rep from Rheumatology called to report that they have reached out to pt for scheduling but pt has yet to fu.

## 2019-04-04 ENCOUNTER — Other Ambulatory Visit: Payer: Self-pay | Admitting: Internal Medicine

## 2019-05-16 DIAGNOSIS — Z1231 Encounter for screening mammogram for malignant neoplasm of breast: Secondary | ICD-10-CM | POA: Diagnosis not present

## 2019-05-16 LAB — HM MAMMOGRAPHY

## 2019-05-21 ENCOUNTER — Encounter: Payer: Self-pay | Admitting: Internal Medicine

## 2019-05-28 ENCOUNTER — Encounter: Payer: Self-pay | Admitting: Internal Medicine

## 2019-05-28 DIAGNOSIS — R921 Mammographic calcification found on diagnostic imaging of breast: Secondary | ICD-10-CM | POA: Diagnosis not present

## 2019-05-28 LAB — HM MAMMOGRAPHY

## 2019-06-24 ENCOUNTER — Other Ambulatory Visit: Payer: Self-pay

## 2019-06-24 ENCOUNTER — Ambulatory Visit (INDEPENDENT_AMBULATORY_CARE_PROVIDER_SITE_OTHER): Payer: Medicare Other

## 2019-06-24 ENCOUNTER — Encounter: Payer: Self-pay | Admitting: Internal Medicine

## 2019-06-24 DIAGNOSIS — Z23 Encounter for immunization: Secondary | ICD-10-CM

## 2019-07-08 ENCOUNTER — Other Ambulatory Visit: Payer: Self-pay | Admitting: Internal Medicine

## 2019-08-07 ENCOUNTER — Other Ambulatory Visit: Payer: Self-pay | Admitting: Internal Medicine

## 2019-08-21 DIAGNOSIS — Z23 Encounter for immunization: Secondary | ICD-10-CM | POA: Diagnosis not present

## 2019-09-18 DIAGNOSIS — Z23 Encounter for immunization: Secondary | ICD-10-CM | POA: Diagnosis not present

## 2019-11-19 ENCOUNTER — Telehealth: Payer: Self-pay

## 2019-11-19 NOTE — Telephone Encounter (Signed)
[facility] form placed in red folder for signature.

## 2019-11-19 NOTE — Telephone Encounter (Signed)
signed

## 2019-11-20 NOTE — Telephone Encounter (Signed)
Faxed form to [facility] at [phone] and received a confirmation that fax went through.

## 2019-12-02 DIAGNOSIS — R921 Mammographic calcification found on diagnostic imaging of breast: Secondary | ICD-10-CM | POA: Diagnosis not present

## 2019-12-02 LAB — HM MAMMOGRAPHY

## 2019-12-15 ENCOUNTER — Encounter: Payer: Self-pay | Admitting: Internal Medicine

## 2020-01-20 ENCOUNTER — Other Ambulatory Visit: Payer: Self-pay

## 2020-01-20 NOTE — Telephone Encounter (Signed)
Please see patient refill request.

## 2020-01-21 MED ORDER — ALLOPURINOL 100 MG PO TABS: 200.0000 mg | ORAL_TABLET | Freq: Every day | ORAL | 0 refills | Status: DC

## 2020-02-17 NOTE — Progress Notes (Signed)
Chief Complaint  Patient presents with  . Annual Exam    states she has a sinus infection, choking from drainage, congestion, fatigue, has been traveling  . Sinusitis    number of issues  and meds   . Medication Management    HPI: Christy Young 74 y.o. come in for Chronic disease management yearly   Visit   Number of issues   recent travel  [place] visit [place] they had colds and she got this  Sx waxing and waning since about august 4  Eye sfeels some watery and burny no fever so nosg cough drainage throat sx  "like he rsinus infections"  Has had covid vaccine   No testing  No exposures but traveling   Left back and radiation  Is wosening and sometims left give out  Wants referral ( delayed with covid )   Allopurinol no se  No gout flare   Pravastatin taking no se reported   bystolic bp ok     Ran out vit d about a week  Had seen dr  E for elevaetd calcium los vit d  hpth ROS: See pertinent positives and negatives per HPI. No sig gi sx  No change vision hearing   See above   Past Medical History:  Diagnosis Date  . ARTHRITIS   . Carpal tunnel syndrome 08/23/2015   Left  . Fatigue   . Gout, unspecified   . Hyperlipidemia   . Hypertension    meds since age 84   . Hyperuricemia   . Nonspecific abnormal electrocardiogram (ECG) (EKG)    t wave non acute    . OBESITY   . Palpitations    hospitalized 2005 felt from stress neg cards eval  . 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 Hypertension Mother   . Arthritis Mother   . Alcohol abuse Father        deceased  . Diabetes Father   . Lymphoma Sister        non hodgkins  . Heart attack Son 18       1999  . Lung cancer Brother   . Hyperparathyroidism Neg Hx     Social History   Socioeconomic History  . Marital status: Single    Spouse name: Not on file  . Number of children: Not on file  . Years of education: Not on file  . Highest  education level: Not on file  Occupational History  . Not on file  Tobacco Use  . Smoking status: Never Smoker  . Smokeless tobacco: Never Used  Vaping Use  . Vaping Use: Never used  Substance and Sexual Activity  . Alcohol use: Yes    Comment: qo pm may drink a glass of wine   . Drug use: No  . Sexual activity: Not on file  Other Topics Concern  . Not on file  Social History Narrative   Occupation: retired Event organiser, 3 yrs of college   Bereaved parent   [place] in 12-29-22    No pets   [id number]   Sis died of cancer lymphoma 77  and bro now with lung cancer and spread.died 06/20/2023.   Social Determinants of Health   Financial Resource Strain:   . Difficulty of Paying Living Expenses: Not on file  Food Insecurity:   . Worried About Charity fundraiser in the  Last Year: Not on file  . Ran Out of Food in the Last Year: Not on file  Transportation Needs:   . Lack of Transportation (Medical): Not on file  . Lack of Transportation (Non-Medical): Not on file  Physical Activity:   . Days of Exercise per Week: Not on file  . Minutes of Exercise per Session: Not on file  Stress:   . Feeling of Stress : Not on file  Social Connections:   . Frequency of Communication with Friends and Family: Not on file  . Frequency of Social Gatherings with Friends and Family: Not on file  . Attends Religious Services: Not on file  . Active Member of Clubs or Organizations: Not on file  . Attends Archivist Meetings: Not on file  . Marital Status: Not on file    Outpatient Medications Prior to Visit  Medication Sig Dispense Refill  . allopurinol (ZYLOPRIM) 100 MG tablet Take 2 tablets (200 mg total) by mouth daily. 180 tablet 0  . BYSTOLIC 5 MG tablet TAKE 1 TABLET DAILY 90 tablet 1  . Omega-3 Fatty Acids (FISH OIL) 1000 MG CPDR Take by mouth.    . pravastatin (PRAVACHOL) 20 MG tablet TAKE 1 TABLET BY MOUTH EVERY DAY 90 tablet 1  . VITAMIN D PO Take 1 capsule by mouth daily.     Marland Kitchen amoxicillin-clavulanate (AUGMENTIN) 875-125 MG tablet Take 1 tablet by mouth every 12 (twelve) hours. (Patient not taking: Reported on 02/18/2020) 14 tablet 0  . Vitamin D, Ergocalciferol, (DRISDOL) 50000 units CAPS capsule TAKE ONE CAPSULE BY MOUTH TWO TIMES WEEKLY (Patient not taking: Reported on 02/03/2019) 8 capsule 0   No facility-administered medications prior to visit.     EXAM:  BP 132/86   Pulse 75   Temp 98 F (36.7 C) (Oral)   Ht 5' 2.75" (1.594 m)   Wt 210 lb 9.6 oz (95.5 kg)   SpO2 95%   BMI 37.60 kg/m   Body mass index is 37.6 kg/m.  GENERAL: vitals reviewed and listed above, alert, oriented, appears well hydrated and in no acute distress mildy watery eyes  Non toxic  mild illness  HEENT: atraumatic, conjunctiva  clear, no obvious abnormalities on inspection of external nose and earstms clear  No face tenderness  OP :masked  NECK: no obvious masses on inspection palpation  LUNGS: clear to auscultation bilaterally, no wheezes, rales or rhonchi, good air movement CV: HRRR, no clubbing cyanosis or  peripheral edema nl cap refill  Abdomen:  Sof,t normal bowel sounds without hepatosplenomegaly, no guarding rebound or masses no CVA tenderness MS: moves all extremities without noticeable focal  Abnormality dtrs present ? Neg slr  Tender left lback area   PSYCH: pleasant and cooperative, no obvious depression or anxiety Lab Results  Component Value Date   WBC 6.1 02/18/2020   HGB 13.5 02/18/2020   HCT 40.6 02/18/2020   PLT 273 02/18/2020   GLUCOSE 101 (H) 02/03/2019   CHOL 253 (H) 02/03/2019   TRIG 129.0 02/03/2019   HDL 51.70 02/03/2019   LDLDIRECT 180.4 04/11/2013   LDLCALC 176 (H) 02/03/2019   ALT 29 02/03/2019   AST 17 02/03/2019   NA 141 02/03/2019   K 3.9 02/03/2019   CL 105 02/03/2019   CREATININE 0.73 02/03/2019   BUN 12 02/03/2019   CO2 26 02/03/2019   TSH 3.15 02/03/2019   HGBA1C 5.7 02/03/2019   MICROALBUR 1.4 12/01/2009   BP Readings from Last  3 Encounters:  02/18/20 132/86  02/03/19 128/82  02/07/18 (!) 136/92    ASSESSMENT AND PLAN:  Discussed the following assessment and plan:  Essential hypertension - Plan: CBC with Differential/Platelet, Basic metabolic panel, Hemoglobin A1c, Hepatic function panel, Lipid panel, TSH, Uric Acid, PTH, intact and calcium, VITAMIN D 25 Hydroxy (Vit-D Deficiency, Fractures), C-reactive protein, C-reactive protein, VITAMIN D 25 Hydroxy (Vit-D Deficiency, Fractures), PTH, intact and calcium, Uric Acid, TSH, Lipid panel, Hepatic function panel, Hemoglobin P3X, Basic metabolic panel, CBC with Differential/Platelet  Hyperlipidemia, unspecified hyperlipidemia type - Plan: CBC with Differential/Platelet, Basic metabolic panel, Hemoglobin A1c, Hepatic function panel, Lipid panel, TSH, Uric Acid, PTH, intact and calcium, VITAMIN D 25 Hydroxy (Vit-D Deficiency, Fractures), C-reactive protein, C-reactive protein, VITAMIN D 25 Hydroxy (Vit-D Deficiency, Fractures), PTH, intact and calcium, Uric Acid, TSH, Lipid panel, Hepatic function panel, Hemoglobin T0W, Basic metabolic panel, CBC with Differential/Platelet  Elevated blood sugar - Plan: CBC with Differential/Platelet, Basic metabolic panel, Hemoglobin A1c, Hepatic function panel, Lipid panel, TSH, Uric Acid, PTH, intact and calcium, VITAMIN D 25 Hydroxy (Vit-D Deficiency, Fractures), C-reactive protein, C-reactive protein, VITAMIN D 25 Hydroxy (Vit-D Deficiency, Fractures), PTH, intact and calcium, Uric Acid, TSH, Lipid panel, Hepatic function panel, Hemoglobin I0X, Basic metabolic panel, CBC with Differential/Platelet  Family history of premature coronary heart disease - Plan: CBC with Differential/Platelet, Basic metabolic panel, Hemoglobin A1c, Hepatic function panel, Lipid panel, TSH, Uric Acid, PTH, intact and calcium, VITAMIN D 25 Hydroxy (Vit-D Deficiency, Fractures), C-reactive protein, C-reactive protein, VITAMIN D 25 Hydroxy (Vit-D Deficiency, Fractures),  PTH, intact and calcium, Uric Acid, TSH, Lipid panel, Hepatic function panel, Hemoglobin B3Z, Basic metabolic panel, CBC with Differential/Platelet  H/O: gout - Plan: CBC with Differential/Platelet, Basic metabolic panel, Hemoglobin A1c, Hepatic function panel, Lipid panel, TSH, Uric Acid, PTH, intact and calcium, VITAMIN D 25 Hydroxy (Vit-D Deficiency, Fractures), C-reactive protein, C-reactive protein, VITAMIN D 25 Hydroxy (Vit-D Deficiency, Fractures), PTH, intact and calcium, Uric Acid, TSH, Lipid panel, Hepatic function panel, Hemoglobin H2D, Basic metabolic panel, CBC with Differential/Platelet  Hyperuricemia - Plan: CBC with Differential/Platelet, Basic metabolic panel, Hemoglobin A1c, Hepatic function panel, Lipid panel, TSH, Uric Acid, PTH, intact and calcium, VITAMIN D 25 Hydroxy (Vit-D Deficiency, Fractures), C-reactive protein, C-reactive protein, VITAMIN D 25 Hydroxy (Vit-D Deficiency, Fractures), PTH, intact and calcium, Uric Acid, TSH, Lipid panel, Hepatic function panel, Hemoglobin J2E, Basic metabolic panel, CBC with Differential/Platelet  Hypercalcemia - Plan: CBC with Differential/Platelet, Basic metabolic panel, Hemoglobin A1c, Hepatic function panel, Lipid panel, TSH, Uric Acid, PTH, intact and calcium, VITAMIN D 25 Hydroxy (Vit-D Deficiency, Fractures), C-reactive protein, C-reactive protein, VITAMIN D 25 Hydroxy (Vit-D Deficiency, Fractures), PTH, intact and calcium, Uric Acid, TSH, Lipid panel, Hepatic function panel, Hemoglobin Q6S, Basic metabolic panel, CBC with Differential/Platelet  Hyperparathyroidism (HCC) - Plan: CBC with Differential/Platelet, Basic metabolic panel, Hemoglobin A1c, Hepatic function panel, Lipid panel, TSH, Uric Acid, PTH, intact and calcium, VITAMIN D 25 Hydroxy (Vit-D Deficiency, Fractures), C-reactive protein, C-reactive protein, VITAMIN D 25 Hydroxy (Vit-D Deficiency, Fractures), PTH, intact and calcium, Uric Acid, TSH, Lipid panel, Hepatic function panel,  Hemoglobin T4H, Basic metabolic panel, CBC with Differential/Platelet  Vitamin D deficiency, unspecified - Plan: CBC with Differential/Platelet, Basic metabolic panel, Hemoglobin A1c, Hepatic function panel, Lipid panel, TSH, Uric Acid, PTH, intact and calcium, VITAMIN D 25 Hydroxy (Vit-D Deficiency, Fractures), C-reactive protein, C-reactive protein, VITAMIN D 25 Hydroxy (Vit-D Deficiency, Fractures), PTH, intact and calcium, Uric Acid, TSH, Lipid panel, Hepatic function panel, Hemoglobin D6Q, Basic metabolic panel, CBC with Differential/Platelet  Medication management - Plan: CBC with Differential/Platelet, Basic metabolic panel, Hemoglobin A1c, Hepatic function panel, Lipid panel, TSH, Uric Acid, PTH, intact and calcium, VITAMIN D 25 Hydroxy (Vit-D Deficiency, Fractures), C-reactive protein, C-reactive protein, VITAMIN D 25 Hydroxy (Vit-D Deficiency, Fractures), PTH, intact and calcium, Uric Acid, TSH, Lipid panel, Hepatic function panel, Hemoglobin K7Q, Basic metabolic panel, CBC with Differential/Platelet  Chronic left-sided low back pain with left-sided sciatica - Plan: CBC with Differential/Platelet, Basic metabolic panel, Hemoglobin A1c, Hepatic function panel, Lipid panel, TSH, Uric Acid, PTH, intact and calcium, VITAMIN D 25 Hydroxy (Vit-D Deficiency, Fractures), C-reactive protein, C-reactive protein, VITAMIN D 25 Hydroxy (Vit-D Deficiency, Fractures), PTH, intact and calcium, Uric Acid, TSH, Lipid panel, Hepatic function panel, Hemoglobin Q5Z, Basic metabolic panel, CBC with Differential/Platelet, Ambulatory referral to Orthopedic Surgery  Sinusitis, unspecified chronicity, unspecified location Due for lab monitoring  To include uric acid vit d and pth Will empirically rx for sinsusits with hx   Of same and over 2 weeks sx but  advise covid testing with travel and similar sx of covid delta  , she says will get tested .  Will refer for lle sciatic sx becoming worse  Plan follow as inicated  depending     recored review attention to a number of issues today  Acute and chronic  49 minutes -Patient advised to return or notify health care team  if  new concerns arise.  Patient Instructions  Treating sinus infection.  Get tested for covid since delta can present like a sinus problem and you should be aware .  Will do referral about the back and sciatic  To ortho etc .   Plan follow up depending on  Results .            Standley Brooking. Davonne Baby M.D.

## 2020-02-18 ENCOUNTER — Ambulatory Visit (INDEPENDENT_AMBULATORY_CARE_PROVIDER_SITE_OTHER): Payer: Medicare Other | Admitting: Internal Medicine

## 2020-02-18 ENCOUNTER — Other Ambulatory Visit: Payer: Self-pay

## 2020-02-18 ENCOUNTER — Encounter: Payer: Self-pay | Admitting: Internal Medicine

## 2020-02-18 VITALS — BP 132/86 | HR 75 | Temp 98.0°F | Ht 62.75 in | Wt 210.6 lb

## 2020-02-18 DIAGNOSIS — E785 Hyperlipidemia, unspecified: Secondary | ICD-10-CM

## 2020-02-18 DIAGNOSIS — E559 Vitamin D deficiency, unspecified: Secondary | ICD-10-CM | POA: Diagnosis not present

## 2020-02-18 DIAGNOSIS — E790 Hyperuricemia without signs of inflammatory arthritis and tophaceous disease: Secondary | ICD-10-CM

## 2020-02-18 DIAGNOSIS — R739 Hyperglycemia, unspecified: Secondary | ICD-10-CM

## 2020-02-18 DIAGNOSIS — Z8249 Family history of ischemic heart disease and other diseases of the circulatory system: Secondary | ICD-10-CM

## 2020-02-18 DIAGNOSIS — I10 Essential (primary) hypertension: Secondary | ICD-10-CM | POA: Diagnosis not present

## 2020-02-18 DIAGNOSIS — G8929 Other chronic pain: Secondary | ICD-10-CM | POA: Diagnosis not present

## 2020-02-18 DIAGNOSIS — E213 Hyperparathyroidism, unspecified: Secondary | ICD-10-CM | POA: Diagnosis not present

## 2020-02-18 DIAGNOSIS — M5442 Lumbago with sciatica, left side: Secondary | ICD-10-CM

## 2020-02-18 DIAGNOSIS — Z79899 Other long term (current) drug therapy: Secondary | ICD-10-CM

## 2020-02-18 DIAGNOSIS — J329 Chronic sinusitis, unspecified: Secondary | ICD-10-CM

## 2020-02-18 DIAGNOSIS — Z8739 Personal history of other diseases of the musculoskeletal system and connective tissue: Secondary | ICD-10-CM | POA: Diagnosis not present

## 2020-02-18 MED ORDER — AMOXICILLIN-POT CLAVULANATE 875-125 MG PO TABS: 1.0000 | ORAL_TABLET | Freq: Two times a day (BID) | ORAL | 0 refills | Status: DC

## 2020-02-18 NOTE — Patient Instructions (Signed)
Treating sinus infection.  Get tested for covid since delta can present like a sinus problem and you should be aware .  Will do referral about the back and sciatic  To ortho etc .   Plan follow up depending on  Results .

## 2020-02-19 LAB — CBC WITH DIFFERENTIAL/PLATELET
Absolute Monocytes: 531 cells/uL (ref 200–950)
Basophils Absolute: 79 cells/uL (ref 0–200)
Basophils Relative: 1.3 %
Eosinophils Absolute: 238 cells/uL (ref 15–500)
Eosinophils Relative: 3.9 %
HCT: 40.6 % (ref 35.0–45.0)
Hemoglobin: 13.5 g/dL (ref 11.7–15.5)
Lymphs Abs: 2513 cells/uL (ref 850–3900)
MCH: 30.1 pg (ref 27.0–33.0)
MCHC: 33.3 g/dL (ref 32.0–36.0)
MCV: 90.4 fL (ref 80.0–100.0)
MPV: 10.4 fL (ref 7.5–12.5)
Monocytes Relative: 8.7 %
Neutro Abs: 2739 cells/uL (ref 1500–7800)
Neutrophils Relative %: 44.9 %
Platelets: 273 10*3/uL (ref 140–400)
RBC: 4.49 10*6/uL (ref 3.80–5.10)
RDW: 13 % (ref 11.0–15.0)
Total Lymphocyte: 41.2 %
WBC: 6.1 10*3/uL (ref 3.8–10.8)

## 2020-02-19 LAB — BASIC METABOLIC PANEL
BUN: 12 mg/dL (ref 7–25)
CO2: 28 mmol/L (ref 20–32)
Calcium: 10.7 mg/dL — ABNORMAL HIGH (ref 8.6–10.4)
Chloride: 106 mmol/L (ref 98–110)
Creat: 0.66 mg/dL (ref 0.60–0.93)
Glucose, Bld: 92 mg/dL (ref 65–99)
Potassium: 4.2 mmol/L (ref 3.5–5.3)
Sodium: 141 mmol/L (ref 135–146)

## 2020-02-19 LAB — HEPATIC FUNCTION PANEL
AG Ratio: 1.8 (calc) (ref 1.0–2.5)
ALT: 20 U/L (ref 6–29)
AST: 15 U/L (ref 10–35)
Albumin: 4 g/dL (ref 3.6–5.1)
Alkaline phosphatase (APISO): 62 U/L (ref 37–153)
Bilirubin, Direct: 0.2 mg/dL (ref 0.0–0.2)
Globulin: 2.2 g/dL (calc) (ref 1.9–3.7)
Indirect Bilirubin: 0.7 mg/dL (calc) (ref 0.2–1.2)
Total Bilirubin: 0.9 mg/dL (ref 0.2–1.2)
Total Protein: 6.2 g/dL (ref 6.1–8.1)

## 2020-02-19 LAB — PTH, INTACT AND CALCIUM
Calcium: 10.7 mg/dL — ABNORMAL HIGH (ref 8.6–10.4)
PTH: 154 pg/mL — ABNORMAL HIGH (ref 14–64)

## 2020-02-19 LAB — TSH: TSH: 1.65 mIU/L (ref 0.40–4.50)

## 2020-02-19 LAB — LIPID PANEL
Cholesterol: 225 mg/dL — ABNORMAL HIGH (ref <200)
HDL: 55 mg/dL (ref >=50)
LDL Cholesterol (Calc): 146 mg/dL (calc) — ABNORMAL HIGH
Non-HDL Cholesterol (Calc): 170 mg/dL (calc) — ABNORMAL HIGH (ref <130)
Total CHOL/HDL Ratio: 4.1 (calc) (ref <5.0)
Triglycerides: 119 mg/dL (ref <150)

## 2020-02-19 LAB — URIC ACID: Uric Acid, Serum: 6.7 mg/dL (ref 2.5–7.0)

## 2020-02-19 LAB — C-REACTIVE PROTEIN: CRP: 4.9 mg/L (ref <8.0)

## 2020-02-19 LAB — HEMOGLOBIN A1C
Hgb A1c MFr Bld: 5.5 % of total Hgb (ref <5.7)
Mean Plasma Glucose: 111 (calc)
eAG (mmol/L): 6.2 (calc)

## 2020-02-19 LAB — VITAMIN D 25 HYDROXY (VIT D DEFICIENCY, FRACTURES): Vit D, 25-Hydroxy: 19 ng/mL — ABNORMAL LOW (ref 30–100)

## 2020-02-25 ENCOUNTER — Ambulatory Visit (INDEPENDENT_AMBULATORY_CARE_PROVIDER_SITE_OTHER): Payer: Medicare Other

## 2020-02-25 ENCOUNTER — Encounter: Payer: Self-pay | Admitting: Family Medicine

## 2020-02-25 ENCOUNTER — Other Ambulatory Visit: Payer: Self-pay

## 2020-02-25 ENCOUNTER — Ambulatory Visit (INDEPENDENT_AMBULATORY_CARE_PROVIDER_SITE_OTHER): Payer: Medicare Other | Admitting: Family Medicine

## 2020-02-25 DIAGNOSIS — G8929 Other chronic pain: Secondary | ICD-10-CM | POA: Diagnosis not present

## 2020-02-25 DIAGNOSIS — M4316 Spondylolisthesis, lumbar region: Secondary | ICD-10-CM

## 2020-02-25 DIAGNOSIS — M5442 Lumbago with sciatica, left side: Secondary | ICD-10-CM

## 2020-02-25 DIAGNOSIS — M25562 Pain in left knee: Secondary | ICD-10-CM | POA: Diagnosis not present

## 2020-02-25 NOTE — Progress Notes (Signed)
Office Visit Note   Patient: Christy Young           Date of Birth: April 30, 1946           MRN: [id number] Visit Date: 02/25/2020 Requested by: Burnis Medin, MD [place],  [place] [id number] PCP: Burnis Medin, MD  Subjective: Chief Complaint  Patient presents with  . Lower Back - Pain    Pain mainly on the left side of lower back x 1 year. Sharp pain down the lateral left leg to the foot. No numbness/tingling in the leg/foot. Hurts worse prior to first BM of the day. Eases afterward. Walks the pain out.    HPI: 74 year old female presenting to clinic today with concerns of chronic lower back pain with left-sided radiculopathy, as well as left knee pain.  Patient states that she has been dealing with this back pain for several years however it has progressively gotten worse over this time and "my body is just falling apart."  She says that she wants to try and remain active, but her back pain and leg pain are starting to prohibit this.  She describes a deep pain in her lower back which will radiate down throughout her left leg, primarily in the posterior aspect, and make her feel as though "I have been running all over [place]."  Pain is primarily an ache, although occasionally it will be sharper.  She denies any numbness, but does say that occasionally she will feel as though her left leg is weaker than her right.  No bowel or bladder dysfunction, though she does state her back pain will improve after bowel movements. Per her knee, patient states that she had initially attributed this to her lumbar radiculopathy, however she has noticed a "grinding" in her left knee which has become more troublesome.  She states her pain is worse in the morning, when her knee feels stiff.  She has been trying to remain active and low impact activities, especially walking.  Primarily, she "just wants to know what is going on, because then I can live with it."  Denies any trauma to the knee,  denies any catching or locking of the leg.              ROS:   All other systems were reviewed and are negative.  Objective: Vital Signs: There were no vitals taken for this visit.  Physical Exam:  General:  Alert and oriented, in no acute distress. Pulm:  Breathing unlabored. Psy:  Normal mood, congruent affect. Skin:  No bruises or rashes appreciated.   Musculoskeletal:  Back: Increased lumbar lordosis and exaggerated thoracic kyphosis.  Normal gait.  Able to achieve toe-touch with no pain. No pain with extension. Endorses pain with bilateral lumbar rotation and side-bending, worse to the left.  Palpation: Tenderness to palpation to bilateral lumbar paraspinal muscles, L>R. Lower lumbar midline tenderness, with subtle step-off appreciated at S1-L5. Negative sacral springs. Mild tenderness over B/L SI Joints.  Special tests:  SLR negative bilaterally FABER with no worsening of pain  Knee exam:  Full ROM of Left knee.  Trace effusion 2+ patellar crepitus with knee flexion/extension Medial JLT on left, no lateral tenderness.  Tenderness with Patellar compression and over patellar facets.  No tenderness on patellar tendon.  No pain or laxity with anterior/posterior drawer, or varus/valgus stress across the knee.  [place] with no pain/clicking    Imaging: XR Lumbar Spine 2-3 Views  Result Date: 02/25/2020 X-rays  lumbar spine reveal diffuse degenerative changes.  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  There is about 10 mm of anterolisthesis of L5 on S1.  There is calcification of the abdominal aorta.  Slight sclerosis of bilateral SI joints.  Hip joints have mild DJD.  No sign of compression fracture or neoplasm.   Assessment & Plan: 74 year old female presenting to clinic with chronic lower back pain with left-sided radiculopathy, as well as left knee pain.  Examination as above.  X-rays of the lumbar spine concerning for anterior listhesis of L5  on S1, of approximately 1 cm.   -Discussed the continued value of physical therapy to strengthen her lumbar spine, to which patient is agreeable.  Will place referral now. -Given the duration of her pain, and the persistence of her radiculopathy, will place referral for consideration of an ESI.   For her knee pain, examination is consistent with osteoarthritis.  We will include this diagnosis and physical therapy referral.  Should her pain worsen, we discussed the option for possible cortisone injections in the future-though she does not feel it is severe enough at this time to warrant such interventions.   -Patient agrees with plan, and has no further questions or concerns at this time.      Procedures: No procedures performed  No notes on file     PMFS History: Patient Active Problem List   Diagnosis Date Noted  . Hyperparathyroidism ([place]) 02/09/2018  . Abnormal prominence of clavicle 02/07/2018  . Psoriasis 10/23/2017  . Carpal tunnel syndrome 08/23/2015  . 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 H/O: gout 08/08/2015  . Elevated blood sugar 08/08/2015  . Essential hypertension 07/09/2014  . Hyperlipidemia 07/09/2014  . Body aches 07/09/2014  . Urinary frequency 07/09/2014  . Numbness and tingling in left hand 07/09/2014  . Visit for preventive health examination 04/11/2013  . Obesity (BMI 30-39.9) 04/11/2013  . Statin intolerance 04/11/2013  . At risk for coronary artery disease 05/15/2012  . Family history of premature coronary heart disease 04/23/2012  . Fatigue 12/29/2010  . Nonspecific abnormal electrocardiogram (ECG) (EKG) 12/29/2010  . Hyperuricemia 12/29/2010  . Palpitations   . OBESITY 01/10/2010  . HYPERLIPIDEMIA 11/09/2009  . Gout 11/09/2009  . HYPERTENSION 11/09/2009  . ARTHRITIS 11/09/2009   Past Medical History:  Diagnosis Date  . ARTHRITIS   . Carpal tunnel syndrome 08/23/2015   Left  . Fatigue   . Gout, unspecified   .  Hyperlipidemia   . Hypertension    meds since age 56   . Hyperuricemia   . Nonspecific abnormal electrocardiogram (ECG) (EKG)    t wave non acute    . OBESITY   . Palpitations    hospitalized 2005 felt from stress neg cards eval  . 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 Hypertension Mother   . Arthritis Mother   . Alcohol abuse Father        deceased  . Diabetes Father   . Lymphoma Sister        non hodgkins  . Heart attack Son 99       1999  . Lung cancer Brother   . Hyperparathyroidism Neg Hx     Past Surgical History:  Procedure Laterality Date  . ABDOMINAL HYSTERECTOMY    . BREAST BIOPSY     right side  . FACIAL COSMETIC  SURGERY  1980   Social History   Occupational History  . Not on file  Tobacco Use  . Smoking status: Never Smoker  . Smokeless tobacco: Never Used  Vaping Use  . Vaping Use: Never used  Substance and Sexual Activity  . Alcohol use: Yes    Comment: qo pm may drink a glass of wine   . Drug use: No  . Sexual activity: Not on file

## 2020-02-25 NOTE — Progress Notes (Signed)
I saw and examined the patient with Dr. Elouise Munroe and agree with assessment and plan as outlined.    Chronic LBP with left sided radicular symptoms.  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  Exam is nonfocal.  Also chronic left knee pain.  Exam reveals patellofemoral crepitus and small effusion.  Probable DJD.  Will try lumbar ESI as well as PT.  Could inject knee in future if needed.  MRI lumbar spine if fails to improve.

## 2020-02-28 NOTE — Progress Notes (Signed)
So vit d is low again as we suspected  but calcium level is  still borderline high  Please send in vit d 50000 IU  to take 1 po q week for 12 weeks  disp 12  no refills  Check Vit D , intact pth with calcium ,at that time  then FU  virtual or in person to review results

## 2020-03-01 ENCOUNTER — Other Ambulatory Visit: Payer: Self-pay | Admitting: Internal Medicine

## 2020-03-02 NOTE — Telephone Encounter (Signed)
I understand  May need virtual visit to review.  Low vit d levels make the PTH level increase   We need to get the vit d level  In a normal range and then check the calcium and pth level  Again to see if your parathyroid glands are working over time or overactive.

## 2020-03-03 ENCOUNTER — Other Ambulatory Visit: Payer: Self-pay

## 2020-03-03 DIAGNOSIS — E559 Vitamin D deficiency, unspecified: Secondary | ICD-10-CM

## 2020-03-03 MED ORDER — VITAMIN D (ERGOCALCIFEROL) 1.25 MG (50000 UNIT) PO CAPS: 50000.0000 [IU] | ORAL_CAPSULE | ORAL | 0 refills | Status: DC

## 2020-03-04 ENCOUNTER — Telehealth: Payer: Self-pay | Admitting: Physical Medicine and Rehabilitation

## 2020-03-04 NOTE — Telephone Encounter (Signed)
Patient called requesting to cancel appt. Please call patient for cancellation. Patient phone number is [phone].

## 2020-03-04 NOTE — Telephone Encounter (Signed)
Appointment cancelled

## 2020-03-11 ENCOUNTER — Ambulatory Visit: Payer: Medicare Other | Admitting: Physical Medicine and Rehabilitation

## 2020-03-11 NOTE — Telephone Encounter (Signed)
1.   In regards to vitamin D there are many options We can refill 50,000 units 1 p.o. weekly for 12 weeks and see what your level is and transition over to over-the-counter medicine supplement.  That might be the easiest. 2.   It appears you had your last colonoscopy at [facility] on February 13, 2014 I suggest you call their office to assess when you are due for your next colonoscopy if you have not already contacted you. In regard to the pravastatin and statin medicine yes we can discuss this although you had some side effects from previous medicines  3.   I suggest we do a follow-up visit to discuss the cholesterol. Treatment options  We can do this virtually.     megan please send in 50000IU Vit d 3 1 po q week x 12  Disp 12 no refills

## 2020-03-12 ENCOUNTER — Other Ambulatory Visit: Payer: Self-pay

## 2020-03-12 ENCOUNTER — Ambulatory Visit (INDEPENDENT_AMBULATORY_CARE_PROVIDER_SITE_OTHER): Payer: Medicare Other | Admitting: Rehabilitative and Restorative Service Providers"

## 2020-03-12 ENCOUNTER — Encounter: Payer: Self-pay | Admitting: Rehabilitative and Restorative Service Providers"

## 2020-03-12 DIAGNOSIS — G8929 Other chronic pain: Secondary | ICD-10-CM

## 2020-03-12 DIAGNOSIS — M5442 Lumbago with sciatica, left side: Secondary | ICD-10-CM | POA: Diagnosis not present

## 2020-03-12 DIAGNOSIS — M6281 Muscle weakness (generalized): Secondary | ICD-10-CM

## 2020-03-12 DIAGNOSIS — M25562 Pain in left knee: Secondary | ICD-10-CM

## 2020-03-12 NOTE — Patient Instructions (Signed)
Access Code: [email or web address] URL: [email or web address] Date: 03/12/2020 Prepared by: Scot Jun  Exercises Supine Lower Trunk Rotation - 2 x daily - 7 x weekly - 5 reps - 1 sets - 15 hold Supine Bridge - 1 x daily - 7 x weekly - 10 reps - 3 sets - 2 hold Small Range Straight Leg Raise - 1 x daily - 7 x weekly - 10 reps - 3 sets Sit to Stand - 1 x daily - 7 x weekly - 3 sets - 10 reps Seated Long Arc Quad - 2 x daily - 7 x weekly - 10 reps - 3 sets - 2 hold

## 2020-03-12 NOTE — Therapy (Signed)
[place], [place], [address] Phone: [phone]   Fax:  [phone]  Physical Therapy Evaluation  Patient Details  Name: Christy Young MRN: [id number] Date of Birth: [birth year] Referring Provider (PT): Dr. Junius Roads   Encounter Date: 03/12/2020   PT End of Session - 03/12/20 1423    Visit Number 1    Number of Visits 12    Date for PT Re-Evaluation 05/07/20    Authorization Type Medicare    Progress Note Due on Visit 10    PT Start Time 1430    PT Stop Time 1515    PT Time Calculation (min) 45 min    Activity Tolerance Patient tolerated treatment well    Behavior During Therapy [facility] for tasks assessed/performed           Past Medical History:  Diagnosis Date  . ARTHRITIS   . Carpal tunnel syndrome 08/23/2015   Left  . Fatigue   . Gout, unspecified   . Hyperlipidemia   . Hypertension    meds since age 53   . Hyperuricemia   . Nonspecific abnormal electrocardiogram (ECG) (EKG)    t wave non acute    . OBESITY   . Palpitations    hospitalized 2005 felt from stress neg cards eval  . 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 ABDOMINAL HYSTERECTOMY    . BREAST BIOPSY     right side  . FACIAL COSMETIC SURGERY  1980    There were no vitals filed for this visit.    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  Pt. saw Dr. Junius Roads recently with plan for therapy and injection but Pt. cancelled injection for back and wanted to try therapy.   Pt. stated insidious onset about 10 years ago but more recent trouble in last year.  Pt. stated decreased activity during COVID restrictions may have factored into condition.    Pertinent History DDD lumbar, gout, HTN, hyperlipidemia,    Limitations Standing;Walking;Sitting    Diagnostic tests xray     Patient Stated Goals Reduce pain, improve activity level    Currently in Pain? Yes    Pain Score 4    pain at worst 10/10   Pain Location Back    Pain Orientation Left    Pain Descriptors / Indicators Numbness;Tingling;Aching;Sore    Pain Type Chronic pain    Pain Radiating Towards Lt LE, specifically in lower leg laterally    Pain Onset More than a month ago    Pain Frequency Intermittent    Aggravating Factors  insidious at times    Pain Relieving Factors Gets better with some movement    Multiple Pain Sites Yes    Pain Score 1   pain at worst 8/10   Pain Location Knee    Pain Orientation Left    Pain Descriptors / Indicators Sore    Pain Type Chronic pain    Pain Onset More than a month ago    Pain Frequency Intermittent    Aggravating Factors  walking, squat, going up stairs    Pain Relieving Factors Some light movement    Effect of Pain on Daily Activities Limited in standing/walking, stairs, squatting              [facility] PT Assessment - 03/12/20  0001      Assessment   Medical Diagnosis Chronic Lt knee pain, back pain c Lt sciatica, spondylosis    Referring Provider (PT) Dr. Junius Roads    Onset Date/Surgical Date 06/27/19    Hand Dominance Right      Precautions   Precautions None      Restrictions   Weight Bearing Restrictions No      Balance Screen   Has the patient fallen in the past 6 months No    Has the patient had a decrease in activity level because of a fear of falling?  Yes    Is the patient reluctant to leave their home because of a fear of falling?  No      Home Ec[place] residence    [place] to enter    Home Layout Two level      Prior Function   Level of [place] Retired    Leisure housework (Arts administrator), traveling, shopping      Cognition   Overall Cognitive Status Within Functional Limits for tasks assessed      Observation/Other  Assessments   Observations Pt. was very active in changing positiongs throughout questions      Sensation   Light Touch Appears Intact      Functional Tests   Functional tests Sit to Stand      Sit to Stand   Comments 18 inch chair s UE assist, successfuly one attempt      ROM / Strength   AROM / PROM / Strength Strength;PROM;AROM      AROM   Overall AROM Comments No specific evidence of centralization/peripherialization noted in lumbar movement    AROM Assessment Site Lumbar;Knee    Right/Left Knee Left;Right    Lumbar Flexion to toes, soreness in back, x 5 no change in radicular symptoms., less soreness in back after    Lumbar Extension 50% soreness Lt buttock, x 5 improved movement to 75%, continued ERP soreness Lt lumbar/buttock    Lumbar - Right Side Bend movement to Rt head of fibula    Lumbar - Left Side Bend movement to Lt knee jt, back soreness      PROM   PROM Assessment Site Knee    Right/Left Knee Left;Right      Strength   Strength Assessment Site Ankle;Knee;Hip;Lumbar    Right/Left Hip Left;Right    Right Hip Flexion 5/5    Left Hip Flexion 5/5    Right/Left Knee Right;Left    Right Knee Flexion 5/5    Right Knee Extension 5/5    Left Knee Flexion 4+/5    Left Knee Extension 4+/5    Right/Left Ankle Left;Right    Right Ankle Dorsiflexion 5/5    Right Ankle Inversion 5/5    Right Ankle Eversion 5/5    Left Ankle Dorsiflexion 5/5    Left Ankle Inversion 5/5    Left Ankle Eversion 5/5      Special Tests   Other special tests (+) Slump on Lt at full range for concordant symptoms Lt posterior LE                      Objective measurements completed on examination: See above findings.       [facility] Adult PT Treatment/Exercise - 03/12/20 0001      Self-Care   Self-Care Other Self-Care Comments  Other Self-Care Comments  Education regarding use of HEP, importance of aerobic exercise in progression of symptom reduction      Exercises    Exercises Other Exercises;Lumbar;Knee/Hip      Lumbar Exercises: Stretches   Lower Trunk Rotation 5 reps   15 sec x 5 bilateral     Lumbar Exercises: Seated   Other Seated Lumbar Exercises seated sciatic nerve flossing x 10      Lumbar Exercises: Supine   Bridge 10 reps    Straight Leg Raise 10 reps   bilateral     Knee/Hip Exercises: Seated   Sit to Sand 5 reps;without UE support                  PT Education - 03/12/20 1423    Education Details HEP, POC    Person(s) Educated Patient    Methods Explanation;Demonstration;Verbal cues;Handout    Comprehension Returned demonstration;Verbalized understanding            PT Short Term Goals - 03/12/20 1424      PT SHORT TERM GOAL #1   Title Patient will demonstrate independent use of home exercise program to maintain progress from in clinic treatments.    Time 2    Period Weeks    Status New    Target Date 03/26/20             PT Long Term Goals - 03/12/20 1424      PT LONG TERM GOAL #1   Title Patient will demonstrate/report pain at worst less than or equal to 2/10 to facilitate minimal limitation in daily activity secondary to pain symptoms.    Time 8    Period Weeks    Status New    Target Date 05/07/20      PT LONG TERM GOAL #2   Title Patient will demonstrate independent use of home exercise program to facilitate ability to maintain/progress functional gains from skilled physical therapy services.    Time 8    Period Weeks    Status New    Target Date 05/07/20      PT LONG TERM GOAL #3   Title Pt. will demonstrate lumbar AROM WFL s symptoms to facilitate usual mobility, standing at PLOF s limitation.    Time 8    Period Weeks    Status New    Target Date 05/07/20      PT LONG TERM GOAL #4   Title Pt. will demonstrate (-) slump testing bilateral to normal mobility at PLOF.    Time 8    Period Weeks    Status New    Target Date 05/07/20      PT LONG TERM GOAL #5   Title Pt. will demonstrate  ability to perform household and community ambulation s restriction due to symptoms.    Time 8    Period Weeks    Status New    Target Date 05/07/20                  Plan - 03/12/20 1425    Clinical Impression Statement Patient is a 74 y.o. female who comes to clinic with complaints of chronic low back pain c Lt radicular symptoms, Lt knee pain with mobility, strength and movement coordination deficits that impair their ability to perform usual daily and recreational functional activities without increase difficulty/symptoms at this time.  Patient to benefit from skilled PT services to address impairments and limitations to improve to previous level of  function without restriction secondary to condition.    Personal Factors and Comorbidities Comorbidity 3+    Comorbidities DDD lumbar, gout, HTN, hyperlipidemia,    Examination-Activity Limitations Sit;Squat;Stairs;Stand;Bend;Lift;Locomotion Level;Transfers    Examination-Participation Restrictions Community Activity;Yard Work;Other   housework   Stability/Clinical Decision Making Evolving/Moderate complexity    Clinical Decision Making Moderate    Rehab Potential --   fair to good   PT Frequency 2x / week   2   PT Duration 8 weeks    PT Treatment/Interventions ADLs/Self Care Home Management;Cryotherapy;Electrical Stimulation;Iontophoresis 4mg /ml Dexamethasone;Moist Heat;Traction;Balance training;Therapeutic exercise;Therapeutic activities;Functional mobility training;Stair training;Gait training;Ultrasound;Neuromuscular re-education;Patient/family education;Passive range of motion;Dry needling;Spinal Manipulations;Joint Manipulations;Splinting;Taping;Manual techniques    PT Next Visit Plan Review HEP, progress core strength/hip strength.  Reassess for radicular symptoms.    PT Home Exercise Plan [id number]    Consulted and Agree with Plan of Care Patient           Patient will benefit from skilled therapeutic intervention in order to  improve the following deficits and impairments:  Abnormal gait, Decreased endurance, Hypomobility, Decreased activity tolerance, Decreased strength, Pain, Increased muscle spasms, Difficulty walking, Decreased mobility, Decreased balance, Decreased range of motion, Impaired perceived functional ability, Improper body mechanics, Postural dysfunction, Impaired flexibility, Decreased coordination  Visit Diagnosis: Chronic pain of left knee  Chronic bilateral low back pain with left-sided sciatica  Muscle weakness (generalized)     Problem List Patient Active Problem List   Diagnosis Date Noted  . Hyperparathyroidism ([place]) 02/09/2018  . Abnormal prominence of clavicle 02/07/2018  . Psoriasis 10/23/2017  . Carpal tunnel syndrome 08/23/2015  . 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 H/O: gout 08/08/2015  . Elevated blood sugar 08/08/2015  . Essential hypertension 07/09/2014  . Hyperlipidemia 07/09/2014  . Body aches 07/09/2014  . Urinary frequency 07/09/2014  . Numbness and tingling in left hand 07/09/2014  . Visit for preventive health examination 04/11/2013  . Obesity (BMI 30-39.9) 04/11/2013  . Statin intolerance 04/11/2013  . At risk for coronary artery disease 05/15/2012  . Family history of premature coronary heart disease 04/23/2012  . Fatigue 12/29/2010  . Nonspecific abnormal electrocardiogram (ECG) (EKG) 12/29/2010  . Hyperuricemia 12/29/2010  . Palpitations   . OBESITY 01/10/2010  . HYPERLIPIDEMIA 11/09/2009  . Gout 11/09/2009  . HYPERTENSION 11/09/2009  . ARTHRITIS 11/09/2009    Scot Jun, PT, DPT, OCS, ATC 03/12/20  3:18 PM    [facility] [address] [place], [place], [address] Phone: [phone]   Fax:  [phone]  Name: Christy Young MRN: [id number] Date of Birth: February 11, 1946

## 2020-03-23 ENCOUNTER — Other Ambulatory Visit: Payer: Self-pay

## 2020-03-23 ENCOUNTER — Ambulatory Visit (INDEPENDENT_AMBULATORY_CARE_PROVIDER_SITE_OTHER): Payer: Medicare Other | Admitting: Physical Therapy

## 2020-03-23 DIAGNOSIS — M5442 Lumbago with sciatica, left side: Secondary | ICD-10-CM

## 2020-03-23 DIAGNOSIS — G8929 Other chronic pain: Secondary | ICD-10-CM | POA: Diagnosis not present

## 2020-03-23 DIAGNOSIS — M6281 Muscle weakness (generalized): Secondary | ICD-10-CM | POA: Diagnosis not present

## 2020-03-23 DIAGNOSIS — M25562 Pain in left knee: Secondary | ICD-10-CM | POA: Diagnosis not present

## 2020-03-23 NOTE — Therapy (Signed)
[place], [place], [address] Phone: [phone]   Fax:  [phone]  Physical Therapy Treatment  Patient Details  Name: SERENE KOPF MRN: [id number] Date of Birth: 1945-08-09 Referring Provider (PT): Dr. Junius Roads   Encounter Date: 03/23/2020   PT End of Session - 03/23/20 1146    Visit Number 2    Number of Visits 12    Date for PT Re-Evaluation 05/07/20    Authorization Type Medicare    Progress Note Due on Visit 10    PT Start Time 1112    PT Stop Time 1145    PT Time Calculation (min) 33 min    Activity Tolerance Patient tolerated treatment well    Behavior During Therapy [facility] for tasks assessed/performed           Past Medical History:  Diagnosis Date  . ARTHRITIS   . Carpal tunnel syndrome 08/23/2015   Left  . Fatigue   . Gout, unspecified   . Hyperlipidemia   . Hypertension    meds since age 51   . Hyperuricemia   . Nonspecific abnormal electrocardiogram (ECG) (EKG)    t wave non acute    . OBESITY   . Palpitations    hospitalized 2005 felt from stress neg cards eval  . 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 ABDOMINAL HYSTERECTOMY    . BREAST BIOPSY     right side  . FACIAL COSMETIC SURGERY  1980    There were no vitals filed for this visit.   Subjective Assessment - 03/23/20 1119    Subjective She relays not that much back pain today, biggest complaint is stiffness and mild pain in her Lt knee.    Pertinent History DDD lumbar, gout, HTN, hyperlipidemia,    Limitations Standing;Walking;Sitting    Diagnostic tests xray    Patient Stated Goals Reduce pain, improve activity level    Pain Onset More than a month ago    Pain Onset More than a month ago                             [facility] Adult PT Treatment/Exercise - 03/23/20 0001      Lumbar Exercises: Stretches   Single Knee to Chest Stretch  Right;Left;2 reps;30 seconds    Lower Trunk Rotation 5 reps;10 seconds    Gastroc Stretch Right;Left;3 reps;30 seconds    Gastroc Stretch Limitations slantboard      Lumbar Exercises: Aerobic   Nustep L5 X 5 min UE/LE      Lumbar Exercises: Machines for Strengthening   Leg Press 62 lbs bilat push 2X10, then 37 lbs SL push 2X10 bilat      Lumbar Exercises: Supine   Bridge 15 reps;5 seconds    Straight Leg Raise 20 reps   2 sets of 10   Other Supine Lumbar Exercises supine clams green X 20                    PT Short Term Goals - 03/12/20 1424      PT SHORT TERM GOAL #1   Title Patient will demonstrate independent use of home exercise program to maintain progress from in clinic treatments.    Time 2    Period Weeks    Status New    Target Date 03/26/20  PT Long Term Goals - 03/12/20 1424      PT LONG TERM GOAL #1   Title Patient will demonstrate/report pain at worst less than or equal to 2/10 to facilitate minimal limitation in daily activity secondary to pain symptoms.    Time 8    Period Weeks    Status New    Target Date 05/07/20      PT LONG TERM GOAL #2   Title Patient will demonstrate independent use of home exercise program to facilitate ability to maintain/progress functional gains from skilled physical therapy services.    Time 8    Period Weeks    Status New    Target Date 05/07/20      PT LONG TERM GOAL #3   Title Pt. will demonstrate lumbar AROM WFL s symptoms to facilitate usual mobility, standing at PLOF s limitation.    Time 8    Period Weeks    Status New    Target Date 05/07/20      PT LONG TERM GOAL #4   Title Pt. will demonstrate (-) slump testing bilateral to normal mobility at PLOF.    Time 8    Period Weeks    Status New    Target Date 05/07/20      PT LONG TERM GOAL #5   Title Pt. will demonstrate ability to perform household and community ambulation s restriction due to symptoms.    Time 8    Period Weeks     Status New    Target Date 05/07/20                 Plan - 03/23/20 1146    Clinical Impression Statement She arrives late so limited some by time constraints. she did not express radicular symptoms today so Session focused on therex for lumbar/knee stretching and strengthening with good overall tolerance and she was educated on general overall gentle activity progression at home. She does relays she felt less stiff after session. Continue POC    Personal Factors and Comorbidities Comorbidity 3+    Comorbidities DDD lumbar, gout, HTN, hyperlipidemia,    Examination-Activity Limitations Sit;Squat;Stairs;Stand;Bend;Lift;Locomotion Level;Transfers    Examination-Participation Restrictions Community Activity;Yard Work;Other   housework   Stability/Clinical Decision Making Evolving/Moderate complexity    Rehab Potential --   fair to good   PT Frequency 2x / week   2   PT Duration 8 weeks    PT Treatment/Interventions ADLs/Self Care Home Management;Cryotherapy;Electrical Stimulation;Iontophoresis 4mg /ml Dexamethasone;Moist Heat;Traction;Balance training;Therapeutic exercise;Therapeutic activities;Functional mobility training;Stair training;Gait training;Ultrasound;Neuromuscular re-education;Patient/family education;Passive range of motion;Dry needling;Spinal Manipulations;Joint Manipulations;Splinting;Taping;Manual techniques    PT Next Visit Plan progress core strength/hip strength.  Reassess for radicular symptoms.    PT Home Exercise Plan [id number]    Consulted and Agree with Plan of Care Patient           Patient will benefit from skilled therapeutic intervention in order to improve the following deficits and impairments:  Abnormal gait, Decreased endurance, Hypomobility, Decreased activity tolerance, Decreased strength, Pain, Increased muscle spasms, Difficulty walking, Decreased mobility, Decreased balance, Decreased range of motion, Impaired perceived functional ability, Improper body  mechanics, Postural dysfunction, Impaired flexibility, Decreased coordination  Visit Diagnosis: Chronic pain of left knee  Chronic bilateral low back pain with left-sided sciatica  Muscle weakness (generalized)     Problem List Patient Active Problem List   Diagnosis Date Noted  . Hyperparathyroidism ([place]) 02/09/2018  . Abnormal prominence of clavicle 02/07/2018  . Psoriasis 10/23/2017  . Carpal tunnel  syndrome 08/23/2015  . 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 H/O: gout 08/08/2015  . Elevated blood sugar 08/08/2015  . Essential hypertension 07/09/2014  . Hyperlipidemia 07/09/2014  . Body aches 07/09/2014  . Urinary frequency 07/09/2014  . Numbness and tingling in left hand 07/09/2014  . Visit for preventive health examination 04/11/2013  . Obesity (BMI 30-39.9) 04/11/2013  . Statin intolerance 04/11/2013  . At risk for coronary artery disease 05/15/2012  . Family history of premature coronary heart disease 04/23/2012  . Fatigue 12/29/2010  . Nonspecific abnormal electrocardiogram (ECG) (EKG) 12/29/2010  . Hyperuricemia 12/29/2010  . Palpitations   . OBESITY 01/10/2010  . HYPERLIPIDEMIA 11/09/2009  . Gout 11/09/2009  . HYPERTENSION 11/09/2009  . ARTHRITIS 11/09/2009    Silvestre Mesi 03/23/2020, 11:49 AM  [facility] [address] [place], [place], [address] Phone: [phone]   Fax:  [phone]  Name: OREATHA FABRY MRN: [id number] Date of Birth: 1945-10-16

## 2020-03-26 ENCOUNTER — Other Ambulatory Visit: Payer: Self-pay

## 2020-03-26 ENCOUNTER — Telehealth: Payer: Self-pay | Admitting: Internal Medicine

## 2020-03-26 NOTE — Telephone Encounter (Signed)
error 

## 2020-03-29 ENCOUNTER — Other Ambulatory Visit: Payer: Medicare Other

## 2020-03-31 ENCOUNTER — Other Ambulatory Visit: Payer: Self-pay

## 2020-03-31 ENCOUNTER — Other Ambulatory Visit (INDEPENDENT_AMBULATORY_CARE_PROVIDER_SITE_OTHER): Payer: Medicare Other

## 2020-03-31 DIAGNOSIS — E559 Vitamin D deficiency, unspecified: Secondary | ICD-10-CM

## 2020-04-01 ENCOUNTER — Ambulatory Visit (INDEPENDENT_AMBULATORY_CARE_PROVIDER_SITE_OTHER): Payer: Medicare Other | Admitting: Rehabilitative and Restorative Service Providers"

## 2020-04-01 ENCOUNTER — Encounter: Payer: Self-pay | Admitting: Rehabilitative and Restorative Service Providers"

## 2020-04-01 DIAGNOSIS — M5442 Lumbago with sciatica, left side: Secondary | ICD-10-CM | POA: Diagnosis not present

## 2020-04-01 DIAGNOSIS — G8929 Other chronic pain: Secondary | ICD-10-CM | POA: Diagnosis not present

## 2020-04-01 DIAGNOSIS — M25562 Pain in left knee: Secondary | ICD-10-CM

## 2020-04-01 DIAGNOSIS — M6281 Muscle weakness (generalized): Secondary | ICD-10-CM

## 2020-04-01 LAB — PTH, INTACT AND CALCIUM
Calcium: 11.3 mg/dL — ABNORMAL HIGH (ref 8.6–10.4)
PTH: 231 pg/mL — ABNORMAL HIGH (ref 14–64)

## 2020-04-01 LAB — VITAMIN D 25 HYDROXY (VIT D DEFICIENCY, FRACTURES): Vit D, 25-Hydroxy: 42 ng/mL (ref 30–100)

## 2020-04-01 NOTE — Therapy (Signed)
[place], [place], [address] Phone: [phone]   Fax:  [phone]  Physical Therapy Treatment  Patient Details  Name: Christy Young MRN: [id number] Date of Birth: 09/15/1945 Referring Provider (PT): Dr. Junius Roads   Encounter Date: 04/01/2020   PT End of Session - 04/01/20 0930    Visit Number 3    Number of Visits 12    Date for PT Re-Evaluation 05/07/20    Authorization Type Medicare    Progress Note Due on Visit 10    PT Start Time 0926    PT Stop Time 1005    PT Time Calculation (min) 39 min    Activity Tolerance Patient tolerated treatment well    Behavior During Therapy [facility] for tasks assessed/performed           Past Medical History:  Diagnosis Date  . ARTHRITIS   . Carpal tunnel syndrome 08/23/2015   Left  . Fatigue   . Gout, unspecified   . Hyperlipidemia   . Hypertension    meds since age 74   . Hyperuricemia   . Nonspecific abnormal electrocardiogram (ECG) (EKG)    t wave non acute    . OBESITY   . Palpitations    hospitalized 2005 felt from stress neg cards eval  . 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 ABDOMINAL HYSTERECTOMY    . BREAST BIOPSY     right side  . FACIAL COSMETIC SURGERY  1980    There were no vitals filed for this visit.   Subjective Assessment - 04/01/20 0928    Subjective Pt. indicated feeling nothing hurting in back.  Lt knee just sore but not painful.  Pt. stated plan to start silver sneakers in Jan.    Pertinent History DDD lumbar, gout, HTN, hyperlipidemia,    Limitations Standing;Walking;Sitting    Diagnostic tests xray    Patient Stated Goals Reduce pain, improve activity level    Currently in Pain? No/denies    Pain Score 0-No pain    Pain Onset More than a month ago    Pain Location Knee    Pain Orientation Left    Pain Descriptors / Indicators Sore    Pain Type Chronic pain     Pain Onset More than a month ago    Aggravating Factors  inactivity    Pain Relieving Factors HEP    Effect of Pain on Daily Activities walking/stairs                             [facility] Adult PT Treatment/Exercise - 04/01/20 0001      Knee/Hip Exercises: Stretches   Gastroc Stretch 3 reps;30 seconds;Both   incline board     Knee/Hip Exercises: Aerobic   Nustep Lvl 5 10 mins      Knee/Hip Exercises: Machines for Strengthening   Total Gym Leg Press 3 x 10 25 lbs SL, performed bilateral      Knee/Hip Exercises: Standing   Forward Step Up Step Height: 6";2 sets;10 reps;Both   finger tips on bar     Knee/Hip Exercises: Seated   Other Seated Knee/Hip Exercises slr 2 x 10 bilateral    Sit to Sand 15 reps;without UE support   18 inch chair  PT Short Term Goals - 03/12/20 1424      PT SHORT TERM GOAL #1   Title Patient will demonstrate independent use of home exercise program to maintain progress from in clinic treatments.    Time 2    Period Weeks    Status New    Target Date 03/26/20             PT Long Term Goals - 03/12/20 1424      PT LONG TERM GOAL #1   Title Patient will demonstrate/report pain at worst less than or equal to 2/10 to facilitate minimal limitation in daily activity secondary to pain symptoms.    Time 8    Period Weeks    Status New    Target Date 05/07/20      PT LONG TERM GOAL #2   Title Patient will demonstrate independent use of home exercise program to facilitate ability to maintain/progress functional gains from skilled physical therapy services.    Time 8    Period Weeks    Status New    Target Date 05/07/20      PT LONG TERM GOAL #3   Title Pt. will demonstrate lumbar AROM WFL s symptoms to facilitate usual mobility, standing at PLOF s limitation.    Time 8    Period Weeks    Status New    Target Date 05/07/20      PT LONG TERM GOAL #4   Title Pt. will demonstrate (-) slump testing  bilateral to normal mobility at PLOF.    Time 8    Period Weeks    Status New    Target Date 05/07/20      PT LONG TERM GOAL #5   Title Pt. will demonstrate ability to perform household and community ambulation s restriction due to symptoms.    Time 8    Period Weeks    Status New    Target Date 05/07/20                 Plan - 04/01/20 2751    Clinical Impression Statement Overall performance today c good techniques c mild cues for improvements.  Continued reduction in lumbar/radicular symptoms indicated at this time.  Pt. may continue to benefit from skilled PT services to improve strength and control on Lt LE to improve knee performance.    Personal Factors and Comorbidities Comorbidity 3+    Comorbidities DDD lumbar, gout, HTN, hyperlipidemia,    Examination-Activity Limitations Sit;Squat;Stairs;Stand;Bend;Lift;Locomotion Level;Transfers    Examination-Participation Restrictions Community Activity;Yard Work;Other   housework   Stability/Clinical Decision Making Evolving/Moderate complexity    Rehab Potential --   fair to good   PT Frequency 2x / week   2   PT Duration 8 weeks    PT Treatment/Interventions ADLs/Self Care Home Management;Cryotherapy;Electrical Stimulation;Iontophoresis 4mg /ml Dexamethasone;Moist Heat;Traction;Balance training;Therapeutic exercise;Therapeutic activities;Functional mobility training;Stair training;Gait training;Ultrasound;Neuromuscular re-education;Patient/family education;Passive range of motion;Dry needling;Spinal Manipulations;Joint Manipulations;Splinting;Taping;Manual techniques    PT Next Visit Plan Continue Lt LE focused strength/mobility gains.    PT Home Exercise Plan [phone]    Consulted and Agree with Plan of Care Patient           Patient will benefit from skilled therapeutic intervention in order to improve the following deficits and impairments:  Abnormal gait, Decreased endurance, Hypomobility, Decreased activity tolerance,  Decreased strength, Pain, Increased muscle spasms, Difficulty walking, Decreased mobility, Decreased balance, Decreased range of motion, Impaired perceived functional ability, Improper body mechanics, Postural dysfunction, Impaired flexibility, Decreased coordination  Visit Diagnosis: Chronic pain of left knee  Chronic bilateral low back pain with left-sided sciatica  Muscle weakness (generalized)     Problem List Patient Active Problem List   Diagnosis Date Noted  . Hyperparathyroidism ([place]) 02/09/2018  . Abnormal prominence of clavicle 02/07/2018  . Psoriasis 10/23/2017  . Carpal tunnel syndrome 08/23/2015  . 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 H/O: gout 08/08/2015  . Elevated blood sugar 08/08/2015  . Essential hypertension 07/09/2014  . Hyperlipidemia 07/09/2014  . Body aches 07/09/2014  . Urinary frequency 07/09/2014  . Numbness and tingling in left hand 07/09/2014  . Visit for preventive health examination 04/11/2013  . Obesity (BMI 30-39.9) 04/11/2013  . Statin intolerance 04/11/2013  . At risk for coronary artery disease 05/15/2012  . Family history of premature coronary heart disease 04/23/2012  . Fatigue 12/29/2010  . Nonspecific abnormal electrocardiogram (ECG) (EKG) 12/29/2010  . Hyperuricemia 12/29/2010  . Palpitations   . OBESITY 01/10/2010  . HYPERLIPIDEMIA 11/09/2009  . Gout 11/09/2009  . HYPERTENSION 11/09/2009  . ARTHRITIS 11/09/2009   Scot Jun, PT, DPT, OCS, ATC 04/01/20  9:57 AM    [facility] [address] [place], [place], [address] Phone: [phone]   Fax:  [phone]  Name: ALAYNE ESTRELLA MRN: [id number] Date of Birth: 07-18-1945

## 2020-04-02 ENCOUNTER — Other Ambulatory Visit: Payer: Self-pay

## 2020-04-03 NOTE — Progress Notes (Signed)
Chief Complaint  Patient presents with  . Follow-up    pt would like to go over medication and cholesterol  . Urinary Frequency    Pt c/o frequent urination with no buring or odor. It comes and goes  . Referral    Pt would like another referral to dermatology due to changes of spot on forehead.     HPI: Christy Young 74 y.o. come in for a  number of issues  Lab elevated calcium taking vit D   Wants derm referral for spot on forehead . Irritated now  Seen in past and felt to be benign but recnetly getting more irritated has psoriasis  quiescent   Elbows  jv9ing urinary frequency  And urgency but no fever or  Dysuria  Or blood  HLD  Taking  Pravastatin had poss se  of atorva and crestor in past   At this time prefers not adding medication. CV health has lost weight and trying to eat better   BP ok .  Asks about  Routine ekg and monitoring  Has no cp sob  Exercise intolerance changes  Seeing  Dr Derry Lory for back trying to avoid injection but may try   Injection in knee  Has back  Arthritis  gets achy many days all over : Has seen rheum in past    No definitive dx  But could hav PSA.  No current gout attacks  No uti  Today  Remote hx of    Seeing dr Loanne Drilling.    ROS: See pertinent positives and negatives per HPI.  Past Medical History:  Diagnosis Date  . ARTHRITIS   . Carpal tunnel syndrome 08/23/2015   Left  . Fatigue   . Gout, unspecified   . Hyperlipidemia   . Hypertension    meds since age 42   . Hyperuricemia   . Nonspecific abnormal electrocardiogram (ECG) (EKG)    t wave non acute    . OBESITY   . Palpitations    hospitalized 2005 felt from stress neg cards eval  . 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 Hypertension Mother   . Arthritis Mother   . Alcohol abuse Father        deceased  . Diabetes Father   . Lymphoma Sister        non hodgkins  . Heart attack Son 44       1999  . Lung  cancer Brother   . Hyperparathyroidism Neg Hx     Social History   Socioeconomic History  . Marital status: Single    Spouse name: Not on file  . Number of children: Not on file  . Years of education: Not on file  . Highest education level: Not on file  Occupational History  . Not on file  Tobacco Use  . Smoking status: Never Smoker  . Smokeless tobacco: Never Used  Vaping Use  . Vaping Use: Never used  Substance and Sexual Activity  . Alcohol use: Yes    Comment: qo pm may drink a glass of wine   . Drug use: No  . Sexual activity: Not on file  Other Topics Concern  . Not on file  Social History Narrative   Occupation: retired Event organiser, 3 yrs of college   Bereaved parent   Single   Moved in June    No pets   G3P2  Sis died of cancer lymphoma 62  and bro now with lung cancer and spread.died 2023/06/16.   Social Determinants of Health   Financial Resource Strain:   . Difficulty of Paying Living Expenses: Not on file  Food Insecurity:   . Worried About Charity fundraiser in the Last Year: Not on file  . Ran Out of Food in the Last Year: Not on file  Transportation Needs:   . Lack of Transportation (Medical): Not on file  . Lack of Transportation (Non-Medical): Not on file  Physical Activity:   . Days of Exercise per Week: Not on file  . Minutes of Exercise per Session: Not on file  Stress:   . Feeling of Stress : Not on file  Social Connections:   . Frequency of Communication with Friends and Family: Not on file  . Frequency of Social Gatherings with Friends and Family: Not on file  . Attends Religious Services: Not on file  . Active Member of Clubs or Organizations: Not on file  . Attends Archivist Meetings: Not on file  . Marital Status: Not on file    Outpatient Medications Prior to Visit  Medication Sig Dispense Refill  . allopurinol (ZYLOPRIM) 100 MG tablet Take 2 tablets (200 mg total) by mouth daily. 180 tablet 0  .  amoxicillin-clavulanate (AUGMENTIN) 875-125 MG tablet Take 1 tablet by mouth every 12 (twelve) hours. For sinusitis 14 tablet 0  . BYSTOLIC 5 MG tablet TAKE 1 TABLET DAILY 90 tablet 1  . Omega-3 Fatty Acids (FISH OIL) 1000 MG CPDR Take by mouth.    . pravastatin (PRAVACHOL) 20 MG tablet TAKE 1 TABLET BY MOUTH EVERY DAY 90 tablet 1  . VITAMIN D PO Take 1 capsule by mouth daily.    . Vitamin D, Ergocalciferol, (DRISDOL) 1.25 MG (50000 UNIT) CAPS capsule Take 1 capsule (50,000 Units total) by mouth every 7 (seven) days. 12 capsule 0   No facility-administered medications prior to visit.     EXAM:  BP 122/76 (BP Location: Left Arm, Patient Position: Sitting)   Pulse 79   Temp 98.7 F (37.1 C)   Ht 5' 2.76" (1.594 m)   Wt 208 lb 3.2 oz (94.4 kg)   SpO2 96%   BMI 37.17 kg/m   Body mass index is 37.17 kg/m. Wt Readings from Last 3 Encounters:  04/05/20 208 lb 3.2 oz (94.4 kg)  02/18/20 210 lb 9.6 oz (95.5 kg)  02/03/19 217 lb 12.8 oz (98.8 kg)    GENERAL: vitals reviewed and listed above, alert, oriented, appears well hydrated and in no acute distressskin  Hypopigment elbows   Forehead scalp line   1 +cm palpable brown keratotic? Lesions  HEENT: atraumatic, conjunctiva  clear, no obvious abnormalities on inspection of external nose and ears OP :masked  NECK: no obvious masses on inspection palpation  LUNGS: clear to auscultation bilaterally, no wheezes, rales or rhonchi, good air movement CV: HRRR, no clubbing cyanosis or  peripheral edema nl cap refill  dont hear murmur  Or carotid bruits  MS: moves all extremities without noticeable focal  abnormality PSYCH: pleasant and cooperative, no obvious depression or anxiety Lab Results  Component Value Date   WBC 6.1 02/18/2020   HGB 13.5 02/18/2020   HCT 40.6 02/18/2020   PLT 273 02/18/2020   GLUCOSE 92 02/18/2020   CHOL 225 (H) 02/18/2020   TRIG 119 02/18/2020   HDL 55 02/18/2020   LDLDIRECT 180.4 04/11/2013   [place]  146 (H)  02/18/2020   ALT 20 02/18/2020   AST 15 02/18/2020   NA 141 02/18/2020   K 4.2 02/18/2020   CL 106 02/18/2020   CREATININE 0.66 02/18/2020   BUN 12 02/18/2020   CO2 28 02/18/2020   TSH 1.65 02/18/2020   HGBA1C 5.5 02/18/2020   MICROALBUR 1.4 12/01/2009   BP Readings from Last 3 Encounters:  04/05/20 122/76  02/18/20 132/86  02/03/19 128/82   Review had echo and ekg  In 2012   Mild ai nl lv caclum mitral nl mild diastolic dysfunction ASSESSMENT AND PLAN:  Discussed the following assessment and plan:  Primary hyperparathyroidism ([place]) - vit d in a better range and still elevated calcium and pth  - Plan: DG Bone Density, Ambulatory referral to Endocrinology  Urinary frequency - Plan: POCT Urinalysis Dipstick  Skin lesion - Plan: Ambulatory referral to Dermatology  Need for immunization against influenza - Plan: Flu Vaccine QUAD High Dose(Fluad)  Estrogen deficiency - Plan: DG Bone Density  Psoriasis - Plan: Ambulatory referral to Dermatology  Hyperlipidemia, unspecified hyperlipidemia type  Essential hypertension  Medication management Need dexa scan updated. Now vit d low normal and more elevation of pth  No hx of renal stones  Uncertain if  Adding to ms aching   Disc adding intensifying   Lipid med but for now will continue same med and lsi . Plan fu lab pre visit in [place]( call ahead)  Jeannene Patella to re refer  MS djd back  Other ?  Has seen rheum in past  Follow  Consider reeval. Disc cv risks mitigation  bp lipids etc  And follow   If any sx we can get cards eval inut follow exam  -Patient advised to return or notify health care team  if  new concerns arise.  Patient Instructions  Will get you back to dr Loanne Drilling.for hyperparathyroidism.   And dermatology referral.   For now  stay on pravastatin.   Blood sugar  Is good now.   If any chest pain   Shortness   of breath problems we can do a  Heart evaluation  also .     Parathyroid Hormone Test Why am I  having this test? The parathyroid hormone (PTH) test may be used to evaluate the function of your parathyroid glands and to check for or monitor conditions that affect the calcium levels in your body. The parathyroid glands are four tiny glands that surround the larger thyroid gland in your neck. When your blood calcium levels are low, your parathyroid glands release PTH into the blood. This causes several changes in your body that result in an increase in your blood calcium level. When blood calcium levels are normal or too high, blood PTH levels decrease. Your health care provider may want you to have this test if you have:  Signs of higher-than-normal blood calcium levels (hypercalcemia).  Signs of lower-than-normal blood calcium levels (hypocalcemia).  A kidney infection.  Kidney disease.  Certain cancer cells that secrete PTH. What is being tested? This test measures the amount of PTH in your blood. What kind of sample is taken?  A blood sample is required for this test. It is usually collected by inserting a needle into a blood vessel or by sticking a finger with a small needle. How do I prepare for this test? Do not eat or drink anything after midnight on the night before the test or as told by your health care provider. How  are the results reported? Your test results will be reported as values that indicate the amount of PTH in your blood. Your health care provider will compare your results to normal ranges that were established after testing a large group of people (reference ranges). The results may include amounts for whole PTH and its fragments (PTH N-terminal and PTH C-terminal). Reference ranges may vary among labs and hospitals. For this test, common reference ranges are:  PTH intact (whole): 10-65 pg/mL or 10-65 ng/L (SI units).  PTH N-terminal: 8-24 pg/mL or 8-24 ng/L (SI units).  PTH C-terminal: 50-330 pg/mL or 50-330 ng/L (SI units). What do the results mean? Test  results that are higher than the reference range may mean that you have:  Overactive parathyroid glands (hyperparathyroidism).  A non-parathyroid PTH-producing tumor.  A kidney defect that is present at birth (congenital).  Hypocalcemia.  Long-term (chronic) kidney failure.  Malabsorption syndrome. This means that your body does not properly absorb nutrients such as calcium from the intestinal tract.  Vitamin D deficiency. Adequate vitamin D levels are required for calcium to be absorbed from the intestinal tract. Vitamin D deficiency can lead to low levels of calcium in the blood. Test results that are lower than the reference range may indicate any of the following:  Underactive parathyroid glands (hypoparathyroidism).  Hypercalcemia.  Bone tumor.  A disease that causes inflammation in your organs and other areas of your body (sarcoidosis).  Vitamin D intoxication.  Milk-alkali syndrome.  An immune system disorder (DiGeorge syndrome). Talk with your health care provider about what your results mean. Questions to ask your health care provider Ask your health care provider, or the department that is doing the test:  When will my results be ready?  How will I get my results?  What are my treatment options?  What other tests do I need?  What are my next steps? Summary  The parathyroid hormone (PTH) test may be used to evaluate the function of your parathyroid glands and to check for or monitor conditions that affect the calcium levels in your body.  The parathyroid glands are four tiny glands that surround the larger thyroid gland in your neck.  When your blood calcium levels are low, your parathyroid glands release PTH into the blood.  Talk with your health care provider about what your results mean. This information is not intended to replace advice given to you by your health care provider. Make sure you discuss any questions you have with your health care  provider. Document Revised: 05/25/2017 Document Reviewed: 02/05/2017 Elsevier Patient Education  2020 Clara Leigh Blas M.D.

## 2020-04-05 ENCOUNTER — Ambulatory Visit (INDEPENDENT_AMBULATORY_CARE_PROVIDER_SITE_OTHER): Payer: Medicare Other | Admitting: Internal Medicine

## 2020-04-05 ENCOUNTER — Encounter: Payer: Self-pay | Admitting: Internal Medicine

## 2020-04-05 ENCOUNTER — Other Ambulatory Visit: Payer: Self-pay

## 2020-04-05 VITALS — BP 122/76 | HR 79 | Temp 98.7°F | Ht 62.76 in | Wt 208.2 lb

## 2020-04-05 DIAGNOSIS — Z79899 Other long term (current) drug therapy: Secondary | ICD-10-CM

## 2020-04-05 DIAGNOSIS — I10 Essential (primary) hypertension: Secondary | ICD-10-CM

## 2020-04-05 DIAGNOSIS — E210 Primary hyperparathyroidism: Secondary | ICD-10-CM | POA: Diagnosis not present

## 2020-04-05 DIAGNOSIS — E785 Hyperlipidemia, unspecified: Secondary | ICD-10-CM

## 2020-04-05 DIAGNOSIS — Z23 Encounter for immunization: Secondary | ICD-10-CM | POA: Diagnosis not present

## 2020-04-05 DIAGNOSIS — R350 Frequency of micturition: Secondary | ICD-10-CM

## 2020-04-05 DIAGNOSIS — E2839 Other primary ovarian failure: Secondary | ICD-10-CM

## 2020-04-05 DIAGNOSIS — L409 Psoriasis, unspecified: Secondary | ICD-10-CM | POA: Diagnosis not present

## 2020-04-05 DIAGNOSIS — L989 Disorder of the skin and subcutaneous tissue, unspecified: Secondary | ICD-10-CM | POA: Diagnosis not present

## 2020-04-05 LAB — POCT URINALYSIS DIPSTICK
Blood, UA: NEGATIVE
Glucose, UA: NEGATIVE
Ketones, UA: NEGATIVE
Leukocytes, UA: NEGATIVE
Nitrite, UA: NEGATIVE
Protein, UA: POSITIVE — AB
Spec Grav, UA: >=1.03 — AB (ref 1.010–1.025)
Urobilinogen, UA: 0.2 E.U./dL
pH, UA: 5.5 (ref 5.0–8.0)

## 2020-04-05 NOTE — Patient Instructions (Addendum)
Will get you back to dr Loanne Drilling.for hyperparathyroidism.   And dermatology referral.   For now  stay on pravastatin.   Blood sugar  Is good now.   If any chest pain   Shortness   of breath problems we can do a  Heart evaluation  also .     Parathyroid Hormone Test Why am I having this test? The parathyroid hormone (PTH) test may be used to evaluate the function of your parathyroid glands and to check for or monitor conditions that affect the calcium levels in your body. The parathyroid glands are four tiny glands that surround the larger thyroid gland in your neck. When your blood calcium levels are low, your parathyroid glands release PTH into the blood. This causes several changes in your body that result in an increase in your blood calcium level. When blood calcium levels are normal or too high, blood PTH levels decrease. Your health care provider may want you to have this test if you have:  Signs of higher-than-normal blood calcium levels (hypercalcemia).  Signs of lower-than-normal blood calcium levels (hypocalcemia).  A kidney infection.  Kidney disease.  Certain cancer cells that secrete PTH. What is being tested? This test measures the amount of PTH in your blood. What kind of sample is taken?  A blood sample is required for this test. It is usually collected by inserting a needle into a blood vessel or by sticking a finger with a small needle. How do I prepare for this test? Do not eat or drink anything after midnight on the night before the test or as told by your health care provider. How are the results reported? Your test results will be reported as values that indicate the amount of PTH in your blood. Your health care provider will compare your results to normal ranges that were established after testing a large group of people (reference ranges). The results may include amounts for whole PTH and its fragments (PTH N-terminal and PTH C-terminal). Reference ranges may  vary among labs and hospitals. For this test, common reference ranges are:  PTH intact (whole): 10-65 pg/mL or 10-65 ng/L (SI units).  PTH N-terminal: 8-24 pg/mL or 8-24 ng/L (SI units).  PTH C-terminal: 50-330 pg/mL or 50-330 ng/L (SI units). What do the results mean? Test results that are higher than the reference range may mean that you have:  Overactive parathyroid glands (hyperparathyroidism).  A non-parathyroid PTH-producing tumor.  A kidney defect that is present at birth (congenital).  Hypocalcemia.  Long-term (chronic) kidney failure.  Malabsorption syndrome. This means that your body does not properly absorb nutrients such as calcium from the intestinal tract.  Vitamin D deficiency. Adequate vitamin D levels are required for calcium to be absorbed from the intestinal tract. Vitamin D deficiency can lead to low levels of calcium in the blood. Test results that are lower than the reference range may indicate any of the following:  Underactive parathyroid glands (hypoparathyroidism).  Hypercalcemia.  Bone tumor.  A disease that causes inflammation in your organs and other areas of your body (sarcoidosis).  Vitamin D intoxication.  Milk-alkali syndrome.  An immune system disorder (DiGeorge syndrome). Talk with your health care provider about what your results mean. Questions to ask your health care provider Ask your health care provider, or the department that is doing the test:  When will my results be ready?  How will I get my results?  What are my treatment options?  What other tests do I need?  What are my next steps? Summary  The parathyroid hormone (PTH) test may be used to evaluate the function of your parathyroid glands and to check for or monitor conditions that affect the calcium levels in your body.  The parathyroid glands are four tiny glands that surround the larger thyroid gland in your neck.  When your blood calcium levels are low, your  parathyroid glands release PTH into the blood.  Talk with your health care provider about what your results mean. This information is not intended to replace advice given to you by your health care provider. Make sure you discuss any questions you have with your health care provider. Document Revised: 05/25/2017 Document Reviewed: 02/05/2017 Elsevier Patient Education  [place].

## 2020-04-07 ENCOUNTER — Encounter: Payer: Medicare Other | Admitting: Rehabilitative and Restorative Service Providers"

## 2020-04-07 DIAGNOSIS — L820 Inflamed seborrheic keratosis: Secondary | ICD-10-CM | POA: Diagnosis not present

## 2020-04-07 DIAGNOSIS — D1801 Hemangioma of skin and subcutaneous tissue: Secondary | ICD-10-CM | POA: Diagnosis not present

## 2020-04-07 DIAGNOSIS — D225 Melanocytic nevi of trunk: Secondary | ICD-10-CM | POA: Diagnosis not present

## 2020-04-07 DIAGNOSIS — L821 Other seborrheic keratosis: Secondary | ICD-10-CM | POA: Diagnosis not present

## 2020-04-07 DIAGNOSIS — L218 Other seborrheic dermatitis: Secondary | ICD-10-CM | POA: Diagnosis not present

## 2020-04-07 DIAGNOSIS — L814 Other melanin hyperpigmentation: Secondary | ICD-10-CM | POA: Diagnosis not present

## 2020-04-13 ENCOUNTER — Other Ambulatory Visit: Payer: Self-pay

## 2020-04-13 ENCOUNTER — Ambulatory Visit: Payer: Medicare Other

## 2020-04-13 DIAGNOSIS — Z23 Encounter for immunization: Secondary | ICD-10-CM | POA: Diagnosis not present

## 2020-04-14 ENCOUNTER — Ambulatory Visit (INDEPENDENT_AMBULATORY_CARE_PROVIDER_SITE_OTHER): Payer: Medicare Other | Admitting: Rehabilitative and Restorative Service Providers"

## 2020-04-14 ENCOUNTER — Encounter: Payer: Self-pay | Admitting: Rehabilitative and Restorative Service Providers"

## 2020-04-14 DIAGNOSIS — M25562 Pain in left knee: Secondary | ICD-10-CM

## 2020-04-14 DIAGNOSIS — M5442 Lumbago with sciatica, left side: Secondary | ICD-10-CM | POA: Diagnosis not present

## 2020-04-14 DIAGNOSIS — M6281 Muscle weakness (generalized): Secondary | ICD-10-CM

## 2020-04-14 DIAGNOSIS — G8929 Other chronic pain: Secondary | ICD-10-CM | POA: Diagnosis not present

## 2020-04-14 NOTE — Therapy (Addendum)
[facility] [address] [place], [place], [address] Phone: [phone]   Fax:  [phone]  Physical Therapy Treatment/Discharge  Patient Details  Name: Christy Young MRN: [id number] Date of Birth: [birth year] Referring Provider (PT): Dr. Junius Roads   Encounter Date: 04/14/2020   PT End of Session - 04/14/20 1109    Visit Number 4    Number of Visits 12    Date for PT Re-Evaluation 05/07/20    Authorization Type Medicare    Progress Note Due on Visit 10    PT Start Time 1100    PT Stop Time 1139    PT Time Calculation (min) 39 min    Activity Tolerance Patient tolerated treatment well    Behavior During Therapy [facility] for tasks assessed/performed           Past Medical History:  Diagnosis Date  . ARTHRITIS   . Carpal tunnel syndrome 08/23/2015   Left  . Fatigue   . Gout, unspecified   . Hyperlipidemia   . Hypertension    meds since age 73   . Hyperuricemia   . Nonspecific abnormal electrocardiogram (ECG) (EKG)    t wave non acute    . OBESITY   . Palpitations    hospitalized 2005 felt from stress neg cards eval  . 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 ABDOMINAL HYSTERECTOMY    . BREAST BIOPSY     right side  . FACIAL COSMETIC SURGERY  1980    There were no vitals filed for this visit.   Subjective Assessment - 04/14/20 1106    Subjective Pt. indicated doing a lot of activity in house, had some pain at times.  Pt. stated less pain today overall everywhere.  Pt. stated feeling like she thinks she is ready to have her last visit.  GROC rated at +7 at this time.    Pertinent History DDD lumbar, gout, HTN, hyperlipidemia,    Limitations Standing;Walking;Sitting    Diagnostic tests xray    Patient Stated Goals Reduce pain, improve activity level    Currently in Pain? Yes    Pain Orientation Left    Pain Descriptors / Indicators Sore    Pain Onset More  than a month ago    Pain Score 0    Pain Onset More than a month ago              [facility] PT Assessment - 04/14/20 0001      Assessment   Medical Diagnosis Chronic Lt knee pain, back pain c Lt sciatica, spondylosis    Referring Provider (PT) Dr. Junius Roads    Onset Date/Surgical Date 06/27/19    Hand Dominance Right      Sit to Stand   Comments 18 inch no UE, no difficulty observed      AROM   Lumbar Flexion to toes    Lumbar Extension 75% c end range stretch    Lumbar - Right Side Bend lateral knee jt no complaint    Lumbar - Left Side Bend lateral knee jt no complaint      Strength   Right Hip Flexion 5/5    Left Hip Flexion 5/5    Right Knee Flexion 5/5    Right Knee Extension 5/5    Left Knee Flexion 5/5    Left Knee Extension 5/5    Right Ankle Dorsiflexion 5/5    Left Ankle  Dorsiflexion 5/5                         [facility] Adult PT Treatment/Exercise - 04/14/20 0001      Lumbar Exercises: Stretches   Single Knee to Chest Stretch 3 reps   3 x 15 sec bilateral   Lower Trunk Rotation 5 reps   15 sec x 5 bilateral   Gastroc Stretch Right;Left;3 reps;30 seconds      Lumbar Exercises: Standing   Other Standing Lumbar Exercises ext x 3      Lumbar Exercises: Supine   Bridge 15 reps;5 seconds    Straight Leg Raise 20 reps   bilateral     Knee/Hip Exercises: Aerobic   Nustep Lvl 6 5 mins, Lvl 5 5 mins      Knee/Hip Exercises: Seated   Sit to Sand 10 reps;without UE support                    PT Short Term Goals - 04/14/20 1109      PT SHORT TERM GOAL #1   Title Patient will demonstrate independent use of home exercise program to maintain progress from in clinic treatments.    Time 2    Period Weeks    Status Achieved    Target Date 03/26/20             PT Long Term Goals - 04/14/20 1109      PT LONG TERM GOAL #1   Title Patient will demonstrate/report pain at worst less than or equal to 2/10 to facilitate minimal limitation in  daily activity secondary to pain symptoms.    Time 8    Period Weeks    Status Partially Met      PT LONG TERM GOAL #2   Title Patient will demonstrate independent use of home exercise program to facilitate ability to maintain/progress functional gains from skilled physical therapy services.    Time 8    Period Weeks    Status Achieved      PT LONG TERM GOAL #3   Title Pt. will demonstrate lumbar AROM WFL s symptoms to facilitate usual mobility, standing at PLOF s limitation.    Time 8    Period Weeks    Status Partially Met      PT LONG TERM GOAL #4   Title Pt. will demonstrate (-) slump testing bilateral to normal mobility at PLOF.    Time 8    Period Weeks    Status Achieved      PT LONG TERM GOAL #5   Title Pt. will demonstrate ability to perform household and community ambulation s restriction due to symptoms.    Time 8    Period Weeks    Status Partially Met                 Plan - 04/14/20 1124    Clinical Impression Statement Pt. has attended 3 visits to this point, showing improvements and reported symptom improvement per Pt.  GROC +7 at this time per Pt.  See objective data for updated information.  Pt. indicated she felt comfortable and ready for use of HEP only at this time due to improvements.  Treatment session spent today in review of HEP for knowledge and use at home upon self d/c.    Personal Factors and Comorbidities Comorbidity 3+    Comorbidities DDD lumbar, gout, HTN, hyperlipidemia,    Examination-Activity Limitations Sit;Squat;Stairs;Stand;Bend;Lift;Locomotion  Level;Transfers    Examination-Participation Restrictions Community Activity;Yard Work;Other   housework   Stability/Clinical Decision Making Evolving/Moderate complexity    Rehab Potential --   fair to good   PT Frequency 2x / week   2   PT Duration 8 weeks    PT Treatment/Interventions ADLs/Self Care Home Management;Cryotherapy;Electrical Stimulation;Iontophoresis 36m/ml  Dexamethasone;Moist Heat;Traction;Balance training;Therapeutic exercise;Therapeutic activities;Functional mobility training;Stair training;Gait training;Ultrasound;Neuromuscular re-education;Patient/family education;Passive range of motion;Dry needling;Spinal Manipulations;Joint Manipulations;Splinting;Taping;Manual techniques    PT Next Visit Plan Hold Pt. case open for return within POC as needed.    PT Home Exercise Plan [id number]   Consulted and Agree with Plan of Care Patient           Patient will benefit from skilled therapeutic intervention in order to improve the following deficits and impairments:  Abnormal gait, Decreased endurance, Hypomobility, Decreased activity tolerance, Decreased strength, Pain, Increased muscle spasms, Difficulty walking, Decreased mobility, Decreased balance, Decreased range of motion, Impaired perceived functional ability, Improper body mechanics, Postural dysfunction, Impaired flexibility, Decreased coordination  Visit Diagnosis: Chronic pain of left knee  Chronic bilateral low back pain with left-sided sciatica  Muscle weakness (generalized)     Problem List Patient Active Problem List   Diagnosis Date Noted  . Hyperparathyroidism (HIona 02/09/2018  . Abnormal prominence of clavicle 02/07/2018  . Psoriasis 10/23/2017  . Carpal tunnel syndrome 08/23/2015  . 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 H/O: gout 08/08/2015  . Elevated blood sugar 08/08/2015  . Essential hypertension 07/09/2014  . Hyperlipidemia 07/09/2014  . Body aches 07/09/2014  . Urinary frequency 07/09/2014  . Numbness and tingling in left hand 07/09/2014  . Visit for preventive health examination 04/11/2013  . Obesity (BMI 30-39.9) 04/11/2013  . Statin intolerance 04/11/2013  . At risk for coronary artery disease 05/15/2012  . Family history of premature coronary heart disease 04/23/2012  . Fatigue 12/29/2010  . Nonspecific abnormal  electrocardiogram (ECG) (EKG) 12/29/2010  . Hyperuricemia 12/29/2010  . Palpitations   . OBESITY 01/10/2010  . HYPERLIPIDEMIA 11/09/2009  . Gout 11/09/2009  . HYPERTENSION 11/09/2009  . ARTHRITIS 11/09/2009    MScot Jun PT, DPT, OCS, ATC 04/14/20  11:30 AM  PHYSICAL THERAPY DISCHARGE SUMMARY  Visits from Start of Care: 4  Current functional level related to goals / functional outcomes: See note   Remaining deficits: See note   Education / Equipment: HEP Plan: Patient agrees to discharge.  Patient goals were partially met. Patient is being discharged due to not returning since the last visit.  ?????     MScot Jun PT, DPT, OCS, ATC 06/02/20  2:24 PM      [facility] [address] [address]Phone: [phone]  Fax:  [phone] Name: Christy CONNAUGHTONMRN: [id number] of Birth: 4December 03, 1947

## 2020-04-21 ENCOUNTER — Other Ambulatory Visit: Payer: Self-pay

## 2020-04-21 ENCOUNTER — Ambulatory Visit (INDEPENDENT_AMBULATORY_CARE_PROVIDER_SITE_OTHER): Payer: Medicare Other

## 2020-04-21 DIAGNOSIS — Z0000 Encounter for general adult medical examination without abnormal findings: Secondary | ICD-10-CM

## 2020-04-21 NOTE — Progress Notes (Signed)
Virtual Visit via Telephone Note  I connected with  [name] on 04/21/20 at 11:00 AM EDT by telephone and verified that I am speaking with the correct person using two identifiers.  Medicare Annual Wellness visit completed telephonically due to Covid-19 pandemic.   Persons participating in this call: This Health Coach and this patient.   Location: Patient: Home Provider: Office   I discussed the limitations, risks, security and privacy concerns of performing an evaluation and management service by telephone and the availability of in person appointments. The patient expressed understanding and agreed to proceed.  Unable to perform video visit due to video visit attempted and failed and/or patient does not have video capability.   Some vital signs may be absent or patient reported.   Willette Brace, LPN    Subjective:   KALIOPE QUINONEZ is a 74 y.o. female who presents for Medicare Annual (Subsequent) preventive examination.  Review of Systems     Cardiac Risk Factors include: advanced age (>62men, >65 women);dyslipidemia;hypertension;obesity (BMI >30kg/m2)     Objective:    There were no vitals filed for this visit. There is no height or weight on file to calculate BMI.  Advanced Directives 04/21/2020 08/01/2016  Does Patient Have a Medical Advance Directive? Yes No  Does patient want to make changes to medical advance directive? Yes (MAU/Ambulatory/Procedural Areas - Information given) -    Current Medications (verified) Outpatient Encounter Medications as of 04/21/2020  Medication Sig  . allopurinol (ZYLOPRIM) 100 MG tablet Take 2 tablets (200 mg total) by mouth daily.  Marland Kitchen BYSTOLIC 5 MG tablet TAKE 1 TABLET DAILY  . Fluocinolone Acetonide Body 0.01 % OIL Apply topically.  . Omega-3 Fatty Acids (FISH OIL) 1000 MG CPDR Take by mouth.  . pravastatin (PRAVACHOL) 20 MG tablet TAKE 1 TABLET BY MOUTH EVERY DAY  . Vitamin D, Ergocalciferol, (DRISDOL) 1.25 MG (50000 UNIT)  CAPS capsule Take 1 capsule (50,000 Units total) by mouth every 7 (seven) days.  . [DISCONTINUED] VITAMIN D PO Take 1 capsule by mouth daily.  . [DISCONTINUED] amoxicillin-clavulanate (AUGMENTIN) 875-125 MG tablet Take 1 tablet by mouth every 12 (twelve) hours. For sinusitis (Patient not taking: Reported on 04/21/2020)   No facility-administered encounter medications on file as of 04/21/2020.    Allergies (verified) Pravastatin, Rosuvastatin, and Penicillins   History: Past Medical History:  Diagnosis Date  . ARTHRITIS   . Carpal tunnel syndrome 08/23/2015   Left  . Fatigue   . Gout, unspecified   . Hyperlipidemia   . Hypertension    meds since age 45   . Hyperuricemia   . Nonspecific abnormal electrocardiogram (ECG) (EKG)    t wave non acute    . OBESITY   . Palpitations    hospitalized 2005 felt from stress neg cards eval  . 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 ABDOMINAL HYSTERECTOMY    . BREAST BIOPSY     right side  . FACIAL COSMETIC SURGERY  1980   Family History  Problem Relation Age of Onset  . Hypertension Mother   . Arthritis Mother   . Alcohol abuse Father        deceased  . Diabetes Father   . Lymphoma Sister        non hodgkins  . Heart attack Son 20       1999  . Lung cancer Brother   . Hyperparathyroidism Neg Hx  Social History   Socioeconomic History  . Marital status: Single    Spouse name: Not on file  . Number of children: Not on file  . Years of education: Not on file  . Highest education level: Not on file  Occupational History  . Occupation: retired   Tobacco Use  . Smoking status: Never Smoker  . Smokeless tobacco: Never Used  Vaping Use  . Vaping Use: Never used  Substance and Sexual Activity  . Alcohol use: Yes    Comment: qo pm may drink a glass of wine   . Drug use: No  . Sexual activity: Not on file  Other Topics Concern  . Not on file    Social History Narrative   Occupation: retired Event organiser, 3 yrs of college   Bereaved parent   Clara in 2022-12-26    No pets   [id number]   Sis died of cancer lymphoma 36  and bro now with lung cancer and spread.died 18-Jun-2023.   Social Determinants of Health   Financial Resource Strain: Low Risk   . Difficulty of Paying Living Expenses: Not hard at all  Food Insecurity: No Food Insecurity  . Worried About Charity fundraiser in the Last Year: Never true  . Ran Out of Food in the Last Year: Never true  Transportation Needs: No Transportation Needs  . Lack of Transportation (Medical): No  . Lack of Transportation (Non-Medical): No  Physical Activity: Insufficiently Active  . Days of Exercise per Week: 5 days  . Minutes of Exercise per Session: 20 min  Stress: No Stress Concern Present  . Feeling of Stress : Not at all  Social Connections: Moderately Isolated  . Frequency of Communication with Friends and Family: More than three times a week  . Frequency of Social Gatherings with Friends and Family: More than three times a week  . Attends Religious Services: Never  . Active Member of Clubs or Organizations: Yes  . Attends Archivist Meetings: 1 to 4 times per year  . Marital Status: Never married    Tobacco Counseling Counseling given: Not Answered   Clinical Intake:  Pre-visit preparation completed: Yes  Pain : No/denies pain     BMI - recorded: 37.17 Nutritional Status: BMI > 30  Obese Nutritional Risks: None Diabetes: No  How often do you need to have someone help you when you read instructions, pamphlets, or other written materials from your doctor or pharmacy?: 1 - Never  Diabetic?No  Interpreter Needed?: No  Information entered by :: Charlott Rakes, LPN   Activities of Daily Living In your present state of health, do you have any difficulty performing the following activities: 04/21/2020  Hearing? N  Vision? N  Difficulty concentrating  or making decisions? Y  Walking or climbing stairs? Y  Comment slower  Dressing or bathing? N  Doing errands, shopping? N  Preparing Food and eating ? N  Using the Toilet? N  In the past six months, have you accidently leaked urine? N  Do you have problems with loss of bowel control? N  Managing your Medications? N  Managing your Finances? N  Housekeeping or managing your Housekeeping? N  Some recent data might be hidden    Patient Care Team: [name], Standley Brooking, MD as PCP - General (Internal Medicine) Valinda Party, MD as Referring Physician (Rheumatology)  Indicate any recent Medical Services you may have received from other than [facility] providers in the  past year (date may be approximate).     Assessment:   This is a routine wellness examination for [facility].  Hearing/Vision screen  Hearing Screening   125Hz  250Hz  500Hz  1000Hz  2000Hz  3000Hz  4000Hz  6000Hz  8000Hz   Right ear:           Left ear:           Comments: Pt denies any hearing issues at this time  Vision Screening Comments: Pt follows up with Dr Alois Cliche for annual eye exams  Dietary issues and exercise activities discussed: Current Exercise Habits: Home exercise routine, Type of exercise: stretching;Other - see comments (doing PT exercise), Time (Minutes): 15, Frequency (Times/Week): 5, Weekly Exercise (Minutes/Week): 75  Goals    . Exercise 150 minutes per week (moderate activity)     Will commit to more walking Parks nearby; no one to go with her; to put into schedule     . Patient Stated     Use PT to help with movement and less pain    . Weight (lb) < 200 lb (90.7 kg)     Being active and watching her diet Serving Sizes A serving size is a measured amount of food or drink, such as one slice of bread, that has an associated nutrient content. Knowing the serving size of a food or drink can help you determine how much of that food you should consume. What is the size of one serving? The size of one healthy  serving depends on the food or drink. To determine a serving size, read the food label. If the food or drink does not have a food label, try to find serving size information online. Or, use the following to estimate the size of one adult serving: Grain  1 slice bread.  bagel.  cup pasta. Vegetable   cup cooked or canned vegetables. 1 cup raw, leafy greens. Fruit   cup canned fruit. 1 medium fruit.  cup dried fruit. Meat and Other Protein Sources  1 oz meat, poultry, or fish.  cup cooked beans. 1 egg.  cup nuts or seeds. 1 Tbsp nut butter.  cup tofu or tempeh. 2 Tbsp hummus. Dairy  An individual container of yogurt (6-8 oz). 1 piece of cheese the size of your thumb (1 oz). 1 cup (8 oz) milk or milk alternative. Fat  A piece the size of one dice. 1 tsp soft margarine. 1 Tbsp mayonnaise. 1 tsp vegetable oil. 1 Tbsp regular salad dressing. 2 Tbsp low-fat salad dressing. How many servings should I eat from each food group each day? The following are the suggested number of servings to try and have every day from each food group. You can also look at your eating throughout the week and aim for meeting these requirements on most days for overall healthy eating. Grain  6-8 servings. Try to have half of your grains from whole grains, such as whole wheat bread, corn tortillas, oatmeal, brown rice, whole wheat pasta, and bulgur. Vegetable  At least 2-3 servings. Fruit  2 servings. Meat and Other Protein Foods  5-6 servings. Aim to have lean proteins, such as chicken, [place], fish, beans, or tofu. Dairy  3 servings. Choose low-fat or nonfat if you are trying to control your weight. Fat  2-3 servings. Is a serving the same thing as a portion? No. A portion is the actual amount you eat, which may be more than one serving. Knowing the specific serving size of a food and the nutritional information that  goes with it can help you make a healthy decision on what size portion to eat. What are some  tips to help me learn healthy serving sizes?  Check food labels for serving sizes. Many foods that come as a single portion actually contain multiple servings.  Determine the serving size of foods you commonly eat and figure out how large a portion you usually eat.  Measure the number of servings that can be held by the bowls, glasses, cups, and plates you typically use. For example, pour your breakfast cereal into your regular bowl and then pour it into a measuring cup.  For 1-2 days, measure the serving sizes of all the foods you eat.  Practice estimating serving sizes and determining how big your portions should be. This information is not intended to replace advice given to you by your health care provider. Make sure you discuss any questions you have with your health care provider. Document Released: 03/11/2003 Document Revised: 02/05/2016 Document Reviewed: 09/09/2013 Elsevier Interactive Patient Education  2017 [employer] 2/9 Scores 04/21/2020 04/05/2020 02/18/2020 02/03/2019 10/16/2018 09/06/2016 08/01/2016  PHQ - 2 Score 0 0 0 0 1 1 0  PHQ- 9 Score - - 0 - 3 - -    Fall Risk Fall Risk  04/21/2020 04/05/2020 02/18/2020 02/03/2019 10/16/2018  Falls in the past year? 0 0 0 0 0  Number falls in past yr: 0 0 - 0 0  Injury with Fall? 0 0 - 0 0  Risk for fall due to : Impaired vision - - - -  Follow up Falls prevention discussed Falls evaluation completed - - -    Any stairs in or around the home? Yes  If so, are there any without handrails? No  Home free of loose throw rugs in walkways, pet beds, electrical cords, etc? Yes  Adequate lighting in your home to reduce risk of falls? Yes   ASSISTIVE DEVICES UTILIZED TO PREVENT FALLS:  Life alert? No  Use of a cane, walker or w/c? No  Grab bars in the bathroom? No  Shower chair or bench in shower? No  Elevated toilet seat or a handicapped toilet? No   TIMED UP AND GO:  Was the test performed? No .       Cognitive Function:     6CIT Screen 04/21/2020 08/01/2016  What Year? 0 points 0 points  What month? 0 points 0 points  What time? - 0 points  Count back from 20 0 points 0 points  Months in reverse 0 points 0 points  Repeat phrase 0 points 0 points  Total Score - 0    Immunizations Immunization History  Administered Date(s) Administered  . Fluad Quad(high Dose 65+) 06/24/2019, 04/05/2020  . Influenza Split 04/23/2012  . Influenza, High Dose Seasonal PF 05/03/2015, 04/11/2017  . Influenza,inj,Quad PF,6+ Mos 04/11/2013  . PFIZER SARS-COV-2 Vaccination 08/21/2019, 09/18/2019  . Pneumococcal Conjugate-13 08/04/2015  . Pneumococcal Polysaccharide-23 12/26/2010  . Td 03/26/2009    TDAP status: Due, Education has been provided regarding the importance of this vaccine. Advised may receive this vaccine at local pharmacy or Health Dept. Aware to provide a copy of the vaccination record if obtained from local pharmacy or Health Dept. Verbalized acceptance and understanding. Flu Vaccine status: Up to date Pneumococcal vaccine status: Up to date Covid-19 vaccine status: Completed vaccines  Qualifies for Shingles Vaccine? Yes   Zostavax completed No   Shingrix Completed?: No.  Education has been provided regarding the importance of this vaccine. Patient has been advised to call insurance company to determine out of pocket expense if they have not yet received this vaccine. Advised may also receive vaccine at local pharmacy or Health Dept. Verbalized acceptance and understanding.  Screening Tests Health Maintenance  Topic Date Due  . TETANUS/TDAP  03/27/2019  . MAMMOGRAM  12/01/2021  . COLONOSCOPY  02/14/2024  . INFLUENZA VACCINE  Completed  . DEXA SCAN  Completed  . COVID-19 Vaccine  Completed  . Hepatitis C Screening  Completed  . PNA vac Low Risk Adult  Completed    Health Maintenance  Health Maintenance Due  Topic Date Due  . TETANUS/TDAP  03/27/2019     Colorectal cancer screening: Completed 02/13/14. Repeat every 10 years Mammogram status: Completed 12/02/19. Repeat every year Bone Density status: Completed 09/20/16. Results reflect: Bone density results: NORMAL. Repeat every 3-5 years.   Additional Screening:  Hepatitis C Screening:  Completed 08/04/15  Vision Screening: Recommended annual ophthalmology exams for early detection of glaucoma and other disorders of the eye. Is the patient up to date with their annual eye exam?  Yes  Who is the provider or what is the name of the office in which the patient attends annual eye exams? Dr Alois Cliche   Dental Screening: Recommended annual dental exams for proper oral hygiene  Community Resource Referral / Chronic Care Management: CRR required this visit?  No   CCM required this visit?  No      Plan:     I have personally reviewed and noted the following in the patient's chart:   . Medical and social history . Use of alcohol, tobacco or illicit drugs  . Current medications and supplements . Functional ability and status . Nutritional status . Physical activity . Advanced directives . List of other physicians . Hospitalizations, surgeries, and ER visits in previous 12 months . Vitals . Screenings to include cognitive, depression, and falls . Referrals and appointments  In addition, I have reviewed and discussed with patient certain preventive protocols, quality metrics, and best practice recommendations. A written personalized care plan for preventive services as well as general preventive health recommendations were provided to patient.     Willette Brace, LPN   [id number]/93/1121   Nurse Notes: None

## 2020-04-21 NOTE — Patient Instructions (Addendum)
Christy Young , Thank you for taking time to come for your Medicare Wellness Visit. I appreciate your ongoing commitment to your health goals. Please review the following plan we discussed and let me know if I can assist you in the future.   Screening recommendations/referrals: Colonoscopy: Done 02/13/14 Mammogram: Done 12/02/19 Bone Density: Done 09/20/16 Recommended yearly ophthalmology/optometry visit for glaucoma screening and checkup Recommended yearly dental visit for hygiene and checkup  Vaccinations: Influenza vaccine: Up to date, Done 04/05/20 Pneumococcal vaccine: Up to date Tdap vaccine: Due and discussed Shingles vaccine: Shingrix discussed. Please contact your pharmacy for coverage information.    Covid-19:Completed 2/25& 09/18/19  Advanced directives: Pt working on information   Conditions/risks identified: Use PT to help with movement and less pain   Next appointment: Follow up in one year for your annual wellness visit   Preventive Care 65 Years and Older, Female Preventive care refers to lifestyle choices and visits with your health care provider that can promote health and wellness. What does preventive care include?  A yearly physical exam. This is also called an annual well check.  Dental exams once or twice a year.  Routine eye exams. Ask your health care provider how often you should have your eyes checked.  Personal lifestyle choices, including:  Daily care of your teeth and gums.  Regular physical activity.  Eating a healthy diet.  Avoiding tobacco and drug use.  Limiting alcohol use.  Practicing safe sex.  Taking low-dose aspirin every day.  Taking vitamin and mineral supplements as recommended by your health care provider. What happens during an annual well check? The services and screenings done by your health care provider during your annual well check will depend on your age, overall health, lifestyle risk factors, and family history of  disease. Counseling  Your health care provider may ask you questions about your:  Alcohol use.  Tobacco use.  Drug use.  Emotional well-being.  Home and relationship well-being.  Sexual activity.  Eating habits.  History of falls.  Memory and ability to understand (cognition).  Work and work Statistician.  Reproductive health. Screening  You may have the following tests or measurements:  Height, weight, and BMI.  Blood pressure.  Lipid and cholesterol levels. These may be checked every 5 years, or more frequently if you are over 53 years old.  Skin check.  Lung cancer screening. You may have this screening every year starting at age 91 if you have a 30-pack-year history of smoking and currently smoke or have quit within the past 15 years.  Fecal occult blood test (FOBT) of the stool. You may have this test every year starting at age 66.  Flexible sigmoidoscopy or colonoscopy. You may have a sigmoidoscopy every 5 years or a colonoscopy every 10 years starting at age 3.  Hepatitis C blood test.  Hepatitis B blood test.  Sexually transmitted disease (STD) testing.  Diabetes screening. This is done by checking your blood sugar (glucose) after you have not eaten for a while (fasting). You may have this done every 1-3 years.  Bone density scan. This is done to screen for osteoporosis. You may have this done starting at age 91.  Mammogram. This may be done every 1-2 years. Talk to your health care provider about how often you should have regular mammograms. Talk with your health care provider about your test results, treatment options, and if necessary, the need for more tests. Vaccines  Your health care provider may recommend certain vaccines,  such as:  Influenza vaccine. This is recommended every year.  Tetanus, diphtheria, and acellular pertussis (Tdap, Td) vaccine. You may need a Td booster every 10 years.  Zoster vaccine. You may need this after age  36.  Pneumococcal 13-valent conjugate (PCV13) vaccine. One dose is recommended after age 64.  Pneumococcal polysaccharide (PPSV23) vaccine. One dose is recommended after age 89. Talk to your health care provider about which screenings and vaccines you need and how often you need them. This information is not intended to replace advice given to you by your health care provider. Make sure you discuss any questions you have with your health care provider. Document Released: 07/09/2015 Document Revised: 03/01/2016 Document Reviewed: 04/13/2015 Elsevier Interactive Patient Education  2017 [employer] Prevention in the Home Falls can cause injuries. They can happen to people of all ages. There are many things you can do to make your home safe and to help prevent falls. What can I do on the outside of my home?  Regularly fix the edges of walkways and driveways and fix any cracks.  Remove anything that might make you trip as you walk through a door, such as a raised step or threshold.  Trim any bushes or trees on the path to your home.  Use bright outdoor lighting.  Clear any walking paths of anything that might make someone trip, such as rocks or tools.  Regularly check to see if handrails are loose or broken. Make sure that both sides of any steps have handrails.  Any raised decks and porches should have guardrails on the edges.  Have any leaves, snow, or ice cleared regularly.  Use sand or salt on walking paths during winter.  Clean up any spills in your garage right away. This includes oil or grease spills. What can I do in the bathroom?  Use night lights.  Install grab bars by the toilet and in the tub and shower. Do not use towel bars as grab bars.  Use non-skid mats or decals in the tub or shower.  If you need to sit down in the shower, use a plastic, non-slip stool.  Keep the floor dry. Clean up any water that spills on the floor as soon as it happens.  Remove  soap buildup in the tub or shower regularly.  Attach bath mats securely with double-sided non-slip rug tape.  Do not have throw rugs and other things on the floor that can make you trip. What can I do in the bedroom?  Use night lights.  Make sure that you have a light by your bed that is easy to reach.  Do not use any sheets or blankets that are too big for your bed. They should not hang down onto the floor.  Have a firm chair that has side arms. You can use this for support while you get dressed.  Do not have throw rugs and other things on the floor that can make you trip. What can I do in the kitchen?  Clean up any spills right away.  Avoid walking on wet floors.  Keep items that you use a lot in easy-to-reach places.  If you need to reach something above you, use a strong step stool that has a grab bar.  Keep electrical cords out of the way.  Do not use floor polish or wax that makes floors slippery. If you must use wax, use non-skid floor wax.  Do not have throw rugs and other things on  the floor that can make you trip. What can I do with my stairs?  Do not leave any items on the stairs.  Make sure that there are handrails on both sides of the stairs and use them. Fix handrails that are broken or loose. Make sure that handrails are as long as the stairways.  Check any carpeting to make sure that it is firmly attached to the stairs. Fix any carpet that is loose or worn.  Avoid having throw rugs at the top or bottom of the stairs. If you do have throw rugs, attach them to the floor with carpet tape.  Make sure that you have a light switch at the top of the stairs and the bottom of the stairs. If you do not have them, ask someone to add them for you. What else can I do to help prevent falls?  Wear shoes that:  Do not have high heels.  Have rubber bottoms.  Are comfortable and fit you well.  Are closed at the toe. Do not wear sandals.  If you use a  stepladder:  Make sure that it is fully opened. Do not climb a closed stepladder.  Make sure that both sides of the stepladder are locked into place.  Ask someone to hold it for you, if possible.  Clearly mark and make sure that you can see:  Any grab bars or handrails.  First and last steps.  Where the edge of each step is.  Use tools that help you move around (mobility aids) if they are needed. These include:  Canes.  Walkers.  Scooters.  Crutches.  Turn on the lights when you go into a dark area. Replace any light bulbs as soon as they burn out.  Set up your furniture so you have a clear path. Avoid moving your furniture around.  If any of your floors are uneven, fix them.  If there are any pets around you, be aware of where they are.  Review your medicines with your doctor. Some medicines can make you feel dizzy. This can increase your chance of falling. Ask your doctor what other things that you can do to help prevent falls. This information is not intended to replace advice given to you by your health care provider. Make sure you discuss any questions you have with your health care provider. Document Released: 04/08/2009 Document Revised: 11/18/2015 Document Reviewed: 07/17/2014 Elsevier Interactive Patient Education  2017 [employer].

## 2020-04-23 ENCOUNTER — Encounter: Payer: Self-pay | Admitting: Endocrinology

## 2020-04-23 ENCOUNTER — Ambulatory Visit (INDEPENDENT_AMBULATORY_CARE_PROVIDER_SITE_OTHER): Payer: Medicare Other | Admitting: Endocrinology

## 2020-04-23 ENCOUNTER — Other Ambulatory Visit: Payer: Self-pay

## 2020-04-23 VITALS — BP 164/92 | HR 92 | Ht 63.0 in | Wt 214.0 lb

## 2020-04-23 DIAGNOSIS — E213 Hyperparathyroidism, unspecified: Secondary | ICD-10-CM

## 2020-04-23 NOTE — Progress Notes (Signed)
Subjective:    Patient ID: Christy Young, female    DOB: 1945-09-11, 74 y.o.   MRN: [id number]  HPI Pt returns for f/u of primary hyperparathyroidism (dx'ed 2018; DEXA in 2018 was normal; she has never had urolithiasis; she takes vit-D, 2000 units/day).  Past Medical History:  Diagnosis Date  . ARTHRITIS   . Carpal tunnel syndrome 08/23/2015   Left  . Fatigue   . Gout, unspecified   . Hyperlipidemia   . Hypertension    meds since age 35   . Hyperuricemia   . Nonspecific abnormal electrocardiogram (ECG) (EKG)    t wave non acute    . OBESITY   . Palpitations    hospitalized 2005 felt from stress neg cards eval  . 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 ABDOMINAL HYSTERECTOMY    . BREAST BIOPSY     right side  . FACIAL COSMETIC SURGERY  1980    Social History   Socioeconomic History  . Marital status: Single    Spouse name: Not on file  . Number of children: Not on file  . Years of education: Not on file  . Highest education level: Not on file  Occupational History  . Occupation: retired   Tobacco Use  . Smoking status: Never Smoker  . Smokeless tobacco: Never Used  Vaping Use  . Vaping Use: Never used  Substance and Sexual Activity  . Alcohol use: Yes    Comment: qo pm may drink a glass of wine   . Drug use: No  . Sexual activity: Not on file  Other Topics Concern  . Not on file  Social History Narrative   Occupation: retired Event organiser, 3 yrs of college   Bereaved parent   [place] in 15-Jan-2023    No pets   [id number]   Sis died of cancer lymphoma 71  and bro now with lung cancer and spread.died 07-08-2023.   Social Determinants of Health   Financial Resource Strain: Low Risk   . Difficulty of Paying Living Expenses: Not hard at all  Food Insecurity: No Food Insecurity  . Worried About Charity fundraiser in the Last Year: Never true  . Ran Out of Food in the Last Year:  Never true  Transportation Needs: No Transportation Needs  . Lack of Transportation (Medical): No  . Lack of Transportation (Non-Medical): No  Physical Activity: Insufficiently Active  . Days of Exercise per Week: 5 days  . Minutes of Exercise per Session: 20 min  Stress: No Stress Concern Present  . Feeling of Stress : Not at all  Social Connections: Moderately Isolated  . Frequency of Communication with Friends and Family: More than three times a week  . Frequency of Social Gatherings with Friends and Family: More than three times a week  . Attends Religious Services: Never  . Active Member of Clubs or Organizations: Yes  . Attends Archivist Meetings: 1 to 4 times per year  . Marital Status: Never married  Intimate Partner Violence: Not At Risk  . Fear of Current or Ex-Partner: No  . Emotionally Abused: No  . Physically Abused: No  . Sexually Abused: No    Current Outpatient Medications on File Prior to Visit  Medication Sig Dispense Refill  . allopurinol (ZYLOPRIM) 100 MG tablet Take 2 tablets (200 mg total) by mouth daily. 180 tablet  0  . BYSTOLIC 5 MG tablet TAKE 1 TABLET DAILY 90 tablet 1  . Fluocinolone Acetonide Body 0.01 % OIL Apply topically.    . Omega-3 Fatty Acids (FISH OIL) 1000 MG CPDR Take by mouth.    . pravastatin (PRAVACHOL) 20 MG tablet TAKE 1 TABLET BY MOUTH EVERY DAY 90 tablet 1  . Vitamin D, Ergocalciferol, (DRISDOL) 1.25 MG (50000 UNIT) CAPS capsule Take 1 capsule (50,000 Units total) by mouth every 7 (seven) days. 12 capsule 0   No current facility-administered medications on file prior to visit.    Allergies  Allergen Reactions  . Pravastatin Other (See Comments)    Body MS aches and pain s  . Rosuvastatin     REACTION: leg cramps.  . Penicillins Other (See Comments)    Patient  says she is not allergic to amoxicillin or augmentin   Has taken these wo problem ? Of SE when she was 53  Not sever     Family History  Problem Relation Age  of Onset  . Hypertension Mother   . Arthritis Mother   . Alcohol abuse Father        deceased  . Diabetes Father   . Lymphoma Sister        non hodgkins  . Heart attack Son 66       1999  . Lung cancer Brother   . Hyperparathyroidism Neg Hx     BP (!) 164/92   Pulse 92   Ht 5\' 3"  (1.6 m)   Wt 214 lb (97.1 kg)   SpO2 97%   BMI 37.91 kg/m    Review of Systems Denies falls.      Objective:   Physical Exam VITAL SIGNS:  See vs page GENERAL: no distress GAIT: normal and steady.    25-OH vit-D=42 Lab Results  Component Value Date   PTH 231 (H) 03/31/2020   CALCIUM 11.3 (H) 03/31/2020       Assessment & Plan:  Primary hyperparathyroidism.  If DEXA is low, surg ref should be considered. Vit-D def: well-replaced.  Please continue the same ergocalciferol.   Patient Instructions  Please continue the same vitamin-D.  If you miss a day, you can make it up the next day.   Please call [phone], to schedule a Bone Density test at [facility].   Please come back for a follow-up appointment in 4 months.

## 2020-04-23 NOTE — Patient Instructions (Addendum)
Please continue the same vitamin-D.  If you miss a day, you can make it up the next day.   Please call [phone], to schedule a Bone Density test at [facility].   Please come back for a follow-up appointment in 4 months.

## 2020-05-12 ENCOUNTER — Other Ambulatory Visit: Payer: Self-pay

## 2020-05-12 ENCOUNTER — Encounter: Payer: Self-pay | Admitting: Family Medicine

## 2020-05-12 ENCOUNTER — Ambulatory Visit (INDEPENDENT_AMBULATORY_CARE_PROVIDER_SITE_OTHER): Payer: Medicare Other | Admitting: Family Medicine

## 2020-05-12 DIAGNOSIS — G8929 Other chronic pain: Secondary | ICD-10-CM | POA: Diagnosis not present

## 2020-05-12 DIAGNOSIS — M5442 Lumbago with sciatica, left side: Secondary | ICD-10-CM | POA: Diagnosis not present

## 2020-05-12 MED ORDER — BACLOFEN 10 MG PO TABS
5.0000 mg | ORAL_TABLET | Freq: Three times a day (TID) | ORAL | 3 refills | Status: DC | PRN
Start: 2020-05-12 — End: 2021-03-16

## 2020-05-12 MED ORDER — PREDNISONE 10 MG PO TABS: ORAL_TABLET | ORAL | 0 refills | Status: DC

## 2020-05-12 MED ORDER — TRAMADOL HCL 50 MG PO TABS
50.0000 mg | ORAL_TABLET | Freq: Four times a day (QID) | ORAL | 0 refills | Status: DC | PRN
Start: 2020-05-12 — End: 2021-03-16

## 2020-05-12 NOTE — Progress Notes (Signed)
Office Visit Note   Patient: Christy Young           Date of Birth: May 08, 1946           MRN: [id number] Visit Date: 05/12/2020 Requested by: Burnis Medin, MD [place],  [place] [id number] PCP: Burnis Medin, MD  Subjective: Chief Complaint  Patient presents with  . Lower Back - Pain    Was doing better for a few weeks, post PT. Pain flared again this week. 2 days ago, could barely walk. Some better today. This pain is "like a bad spasm" in the left lower back.    HPI: 74yo F presenting to clinic with concerns of acute on chronic low back pain, radiating into her left leg. Previously, patient seen in clinic 2 mo ago, and had been sent to PT. She states that this had been working well for her, and she felt as though she was doing much better. Unfortunately, last week she went shopping (She has a walking routine around [employer]), and then started to experience severe pain the following day. States her pain is located primarily in the left gluteal/SI region, and shoots down into her left ankle. She denies weakness or bowel/bladder dysfunction. She is traveling to [facility] this weekend, and is worried she may have to cancel her trip.               ROS:   All other systems were reviewed and are negative.  Objective: Vital Signs: There were no vitals taken for this visit.  Physical Exam:  General:  Alert and oriented, in no acute distress. Pulm:  Breathing unlabored. Psy:  Normal mood, congruent affect. Skin:  Lower back with no bruising, rashes, or erythema. Overlying skin intact.   BACK EXAMINATION: Antalgic Gait, ambulates with cane assistance.  Stands with excessive lumbar lordosis.  ROM: No Pain with forward flexion. Able to achieve Toe-Touch. Does endorse worsening pain with extension.   Palpation: Left sided paraspinal tenderness over lower lumbar area. Tenderness to Left SI Joint, and within Left gluteal/piriformis area.  No midline tenderness, no deformity or  step-offs.   Strength: Hip flexion (L1), Hip Aduction (L2), Knee Extension (L3) are 5/5 Bilaterally Foot Inversion (L4), Dorsiflexion (L5), and Eversion (S1) 5/5 Bilaterally Sensation: Intact to light touch medial and lateral aspects of lower extremities, and lateral, dorsal, and medial aspects of foot.  Reflexes: Patellar (L4) 2+ Bilaterally Special Tests:  FABER worsens lower back pain. Seated SLR: endorses worsening pain down left left with both left straightening and contralateral leg straightening.    Imaging: No results found.  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 Previously, patient opted for physical therapy, and did state that this had offered significant improvement. Given her acute worsening, will try prednisone course to settle inflamed nerve.  - Will order MRI, to be completed upon her return from [facility] next week - Consider ESI pending MRI results - Patient expresses understanding, and has no further questions or concerns.      Procedures: No procedures performed  No notes on file     PMFS History: Patient Active Problem List   Diagnosis Date Noted  . Hyperparathyroidism ([place]) 02/09/2018  . Abnormal prominence of clavicle 02/07/2018  . Psoriasis 10/23/2017  . Carpal tunnel syndrome 08/23/2015  . 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 H/O: gout 08/08/2015  . Elevated blood sugar 08/08/2015  .  Essential hypertension 07/09/2014  . Hyperlipidemia 07/09/2014  . Body aches 07/09/2014  . Urinary frequency 07/09/2014  . Numbness and tingling in left hand 07/09/2014  . Visit for preventive health examination 04/11/2013  . Obesity (BMI 30-39.9) 04/11/2013  . Statin intolerance 04/11/2013  . At risk for coronary artery disease 05/15/2012  . Family history of premature coronary heart disease 04/23/2012  . Fatigue 12/29/2010  . Nonspecific abnormal electrocardiogram (ECG)  (EKG) 12/29/2010  . Hyperuricemia 12/29/2010  . Palpitations   . OBESITY 01/10/2010  . HYPERLIPIDEMIA 11/09/2009  . Gout 11/09/2009  . HYPERTENSION 11/09/2009  . ARTHRITIS 11/09/2009   Past Medical History:  Diagnosis Date  . ARTHRITIS   . Carpal tunnel syndrome 08/23/2015   Left  . Fatigue   . Gout, unspecified   . Hyperlipidemia   . Hypertension    meds since age 60   . Hyperuricemia   . Nonspecific abnormal electrocardiogram (ECG) (EKG)    t wave non acute    . OBESITY   . Palpitations    hospitalized 2005 felt from stress neg cards eval  . 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 Hypertension Mother   . Arthritis Mother   . Alcohol abuse Father        deceased  . Diabetes Father   . Lymphoma Sister        non hodgkins  . Heart attack Son 54       1999  . Lung cancer Brother   . Hyperparathyroidism Neg Hx     Past Surgical History:  Procedure Laterality Date  . ABDOMINAL HYSTERECTOMY    . BREAST BIOPSY     right side  . FACIAL COSMETIC SURGERY  1980   Social History   Occupational History  . Occupation: retired   Tobacco Use  . Smoking status: Never Smoker  . Smokeless tobacco: Never Used  Vaping Use  . Vaping Use: Never used  Substance and Sexual Activity  . Alcohol use: Yes    Comment: qo pm may drink a glass of wine   . Drug use: No  . Sexual activity: Not on file

## 2020-05-12 NOTE — Progress Notes (Signed)
I saw and examined the patient with Dr. Elouise Munroe and agree with assessment and plan as outlined.    Was doing better after PT, but then the left-sided sciatica returned last weekend.  Severe pain at first, but getting better.  Leaving for [place] next week for a funeral.  Will try prednisone, baclofen, tramadol.  MRI ordered.  Consider ESI or resumption of PT depending on results.

## 2020-05-19 ENCOUNTER — Other Ambulatory Visit: Payer: Self-pay | Admitting: Internal Medicine

## 2020-05-25 ENCOUNTER — Other Ambulatory Visit: Payer: Self-pay

## 2020-06-06 ENCOUNTER — Ambulatory Visit
Admission: RE | Admit: 2020-06-06 | Discharge: 2020-06-06 | Disposition: A | Discharge status: Home / Self Care | Payer: Medicare Other | Source: Ambulatory Visit | Attending: Family Medicine | Admitting: Family Medicine

## 2020-06-06 ENCOUNTER — Other Ambulatory Visit: Payer: Self-pay

## 2020-06-06 DIAGNOSIS — M5442 Lumbago with sciatica, left side: Secondary | ICD-10-CM

## 2020-06-07 ENCOUNTER — Other Ambulatory Visit: Payer: Self-pay | Admitting: Family Medicine

## 2020-06-07 DIAGNOSIS — Z77018 Contact with and (suspected) exposure to other hazardous metals: Secondary | ICD-10-CM

## 2020-06-08 ENCOUNTER — Other Ambulatory Visit: Payer: Self-pay

## 2020-06-08 ENCOUNTER — Ambulatory Visit (INDEPENDENT_AMBULATORY_CARE_PROVIDER_SITE_OTHER)
Admission: RE | Admit: 2020-06-08 | Discharge: 2020-06-08 | Disposition: A | Discharge status: Home / Self Care | Payer: Medicare Other | Source: Ambulatory Visit | Attending: Endocrinology | Admitting: Endocrinology

## 2020-06-08 DIAGNOSIS — E213 Hyperparathyroidism, unspecified: Secondary | ICD-10-CM

## 2020-07-05 ENCOUNTER — Ambulatory Visit
Admission: RE | Admit: 2020-07-05 | Discharge: 2020-07-05 | Disposition: A | Discharge status: Home / Self Care | Payer: Medicare Other | Source: Ambulatory Visit | Attending: Family Medicine | Admitting: Family Medicine

## 2020-07-05 ENCOUNTER — Telehealth: Payer: Self-pay | Admitting: Family Medicine

## 2020-07-05 ENCOUNTER — Other Ambulatory Visit: Payer: Self-pay

## 2020-07-05 DIAGNOSIS — Z77018 Contact with and (suspected) exposure to other hazardous metals: Secondary | ICD-10-CM

## 2020-07-05 DIAGNOSIS — S0085XA Superficial foreign body of other part of head, initial encounter: Secondary | ICD-10-CM | POA: Diagnosis not present

## 2020-07-05 DIAGNOSIS — G8929 Other chronic pain: Secondary | ICD-10-CM

## 2020-07-05 DIAGNOSIS — Z135 Encounter for screening for eye and ear disorders: Secondary | ICD-10-CM | POA: Diagnosis not present

## 2020-07-05 DIAGNOSIS — M48061 Spinal stenosis, lumbar region without neurogenic claudication: Secondary | ICD-10-CM | POA: Diagnosis not present

## 2020-07-05 IMAGING — MR MR LUMBAR SPINE W/O CM
4 of 5 series · 27 of 48 positions shown · non-contrast
Comparison: None.

CLINICAL DATA: Lumbar radiculopathy.

EXAM:
MRI LUMBAR SPINE WITHOUT CONTRAST
TECHNIQUE: Multiplanar, multisequence MR imaging of the lumbar spine was
performed. No intravenous contrast was administered.

[Series 2: T2 · sagittal · 4.0mm · 1.09mm/px · 6 of 15 slices shown (1 of 2)]
[im 1/15]
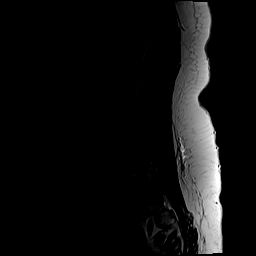
[im 3/15]
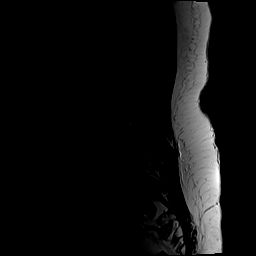
[im 6/15]
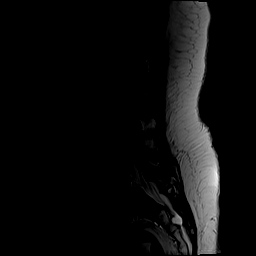
[im 9/15]
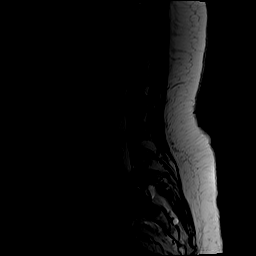
[im 12/15]
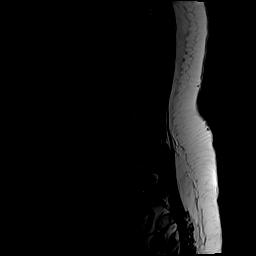
[im 15/15]
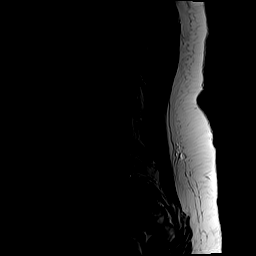

[Series 4: T1 · sagittal · 4.0mm · 1.09mm/px · 6 of 15 slices shown (1 of 2)]
[im 1/15]
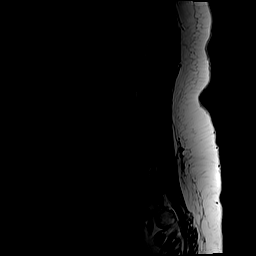
[im 3/15]
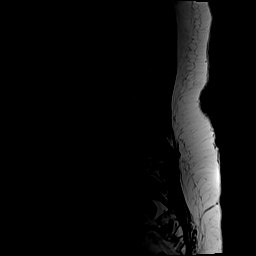
[im 6/15]
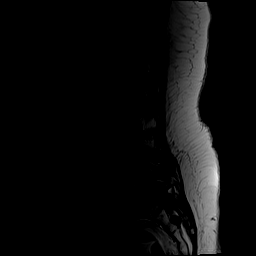
[im 9/15]
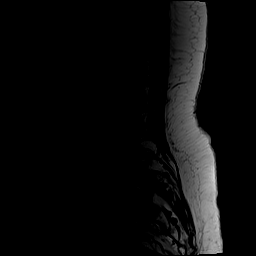
[im 12/15]
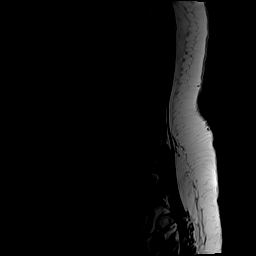
[im 15/15]
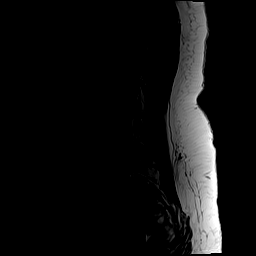

[Series 5: T2 · axial · 4.0mm · 0.39mm/px · z∈[-84,+131]mm · 9 of 39 slices shown (2 of 2)]
[im 1/39]
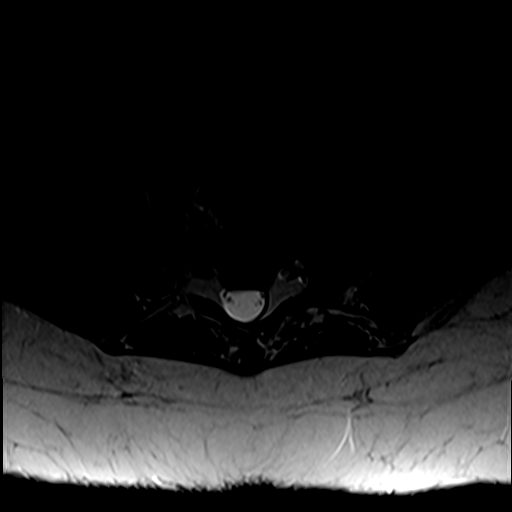
[im 6/39]
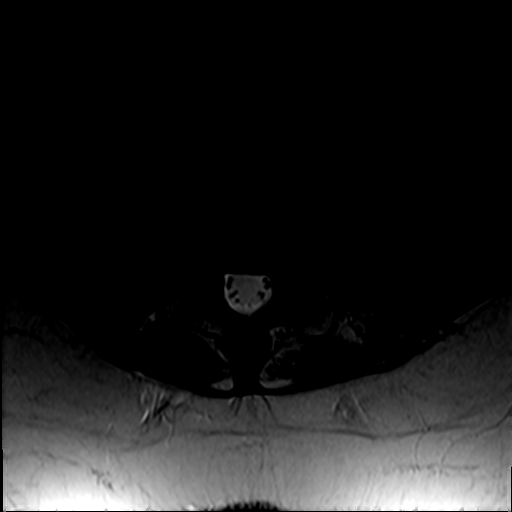
[im 11/39]
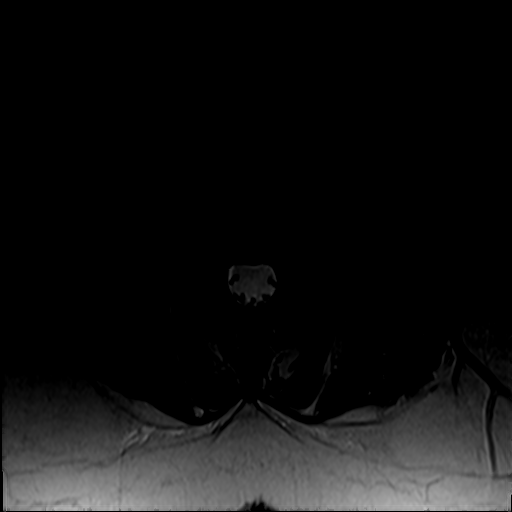
[im 17/39]
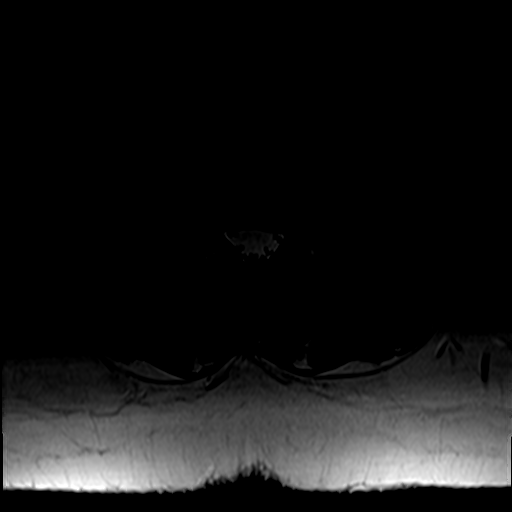
[im 20/39]
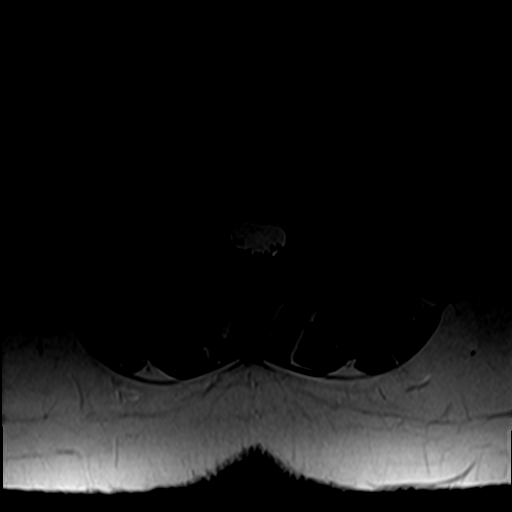
[im 22/39]
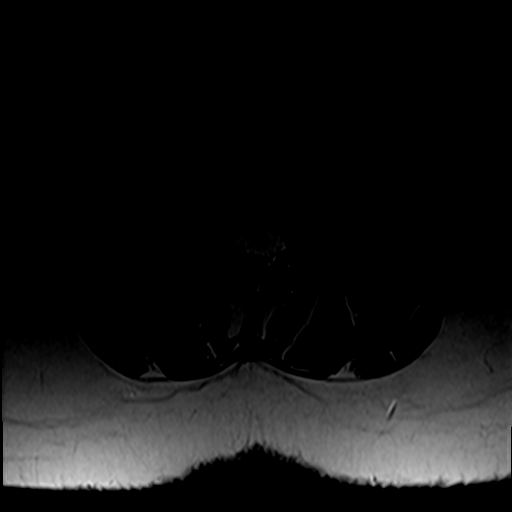
[im 28/39]
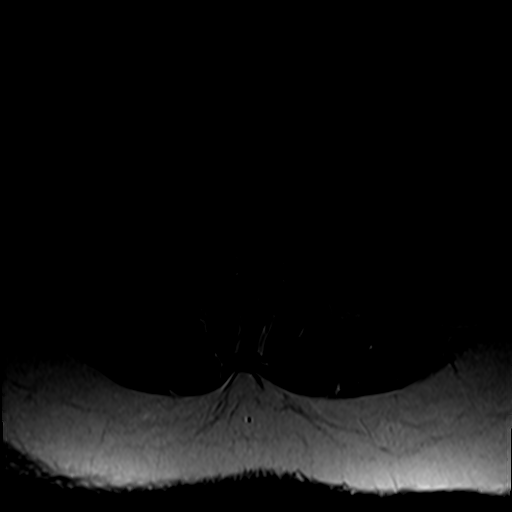
[im 33/39]
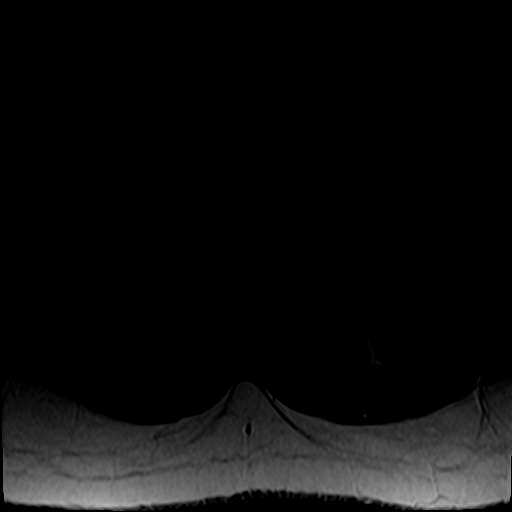
[im 39/39]
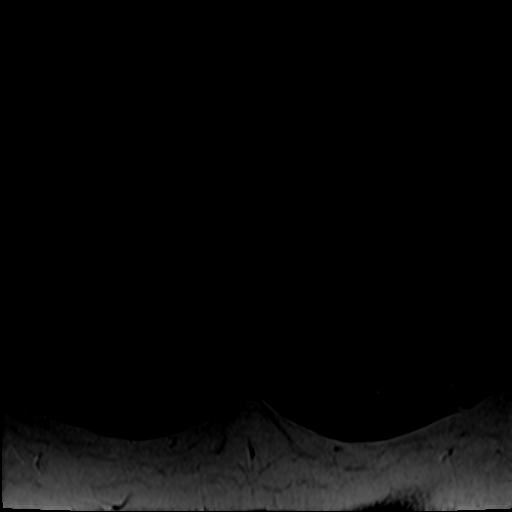

[Series 6: T1 · axial · 4.0mm · 0.39mm/px · z∈[-84,+103]mm · 6 of 39 slices shown (2 of 2)]
[im 1/39]
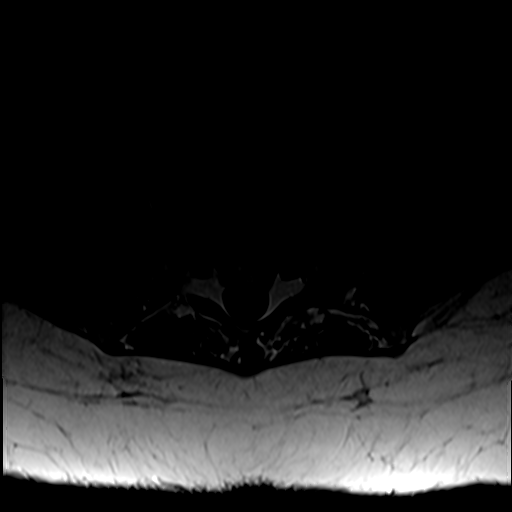
[im 6/39]
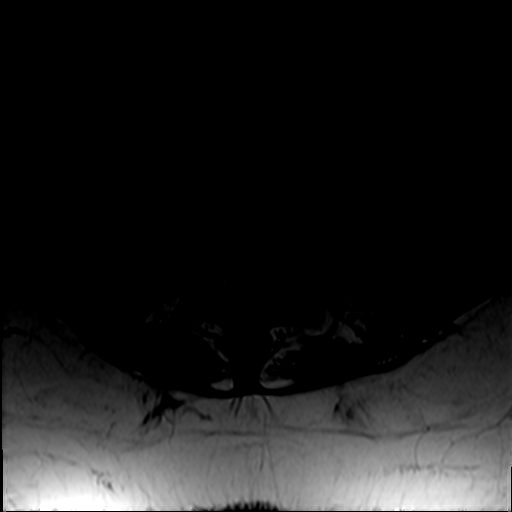
[im 11/39]
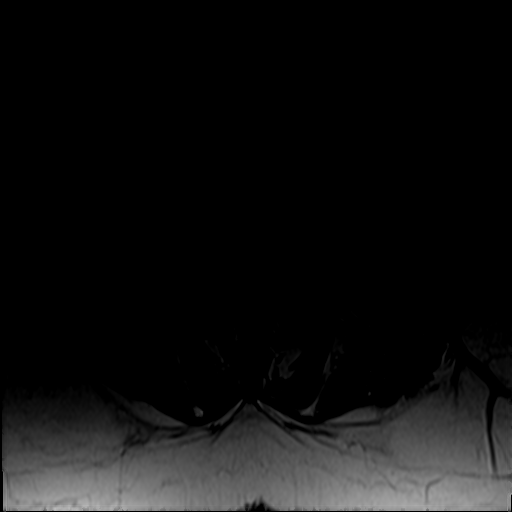
[im 17/39]
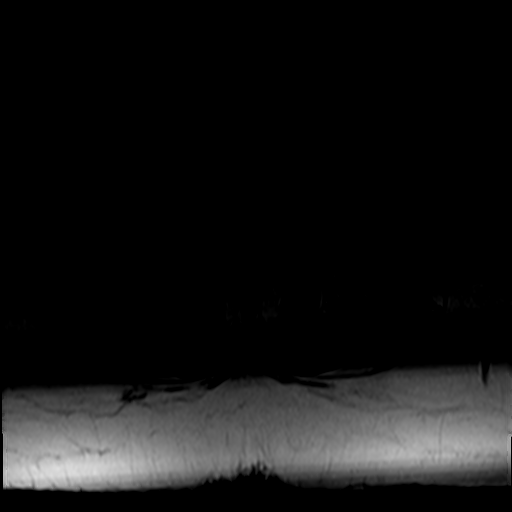
[im 20/39]
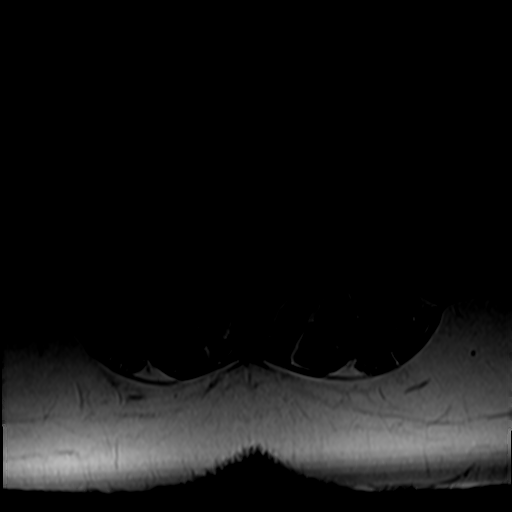
[im 33/39]
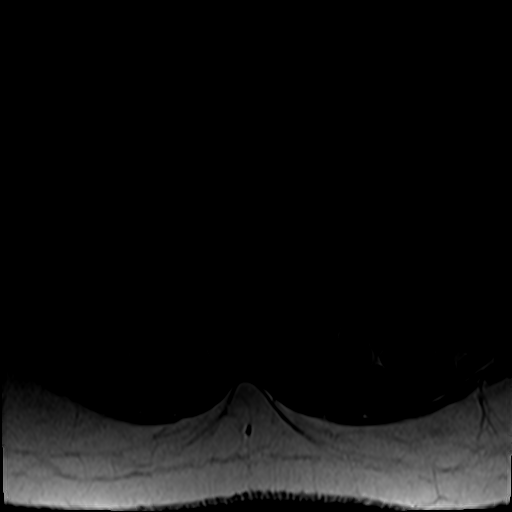

[27 of 48 positions shown; findings below may reference images not displayed]

FINDINGS: Segmentation:  Standard.

Alignment: Grade 1 anterolisthesis of L5 on S1 secondary to
bilateral L5 pars interarticularis defects.

Vertebrae:  No fracture, evidence of discitis, or bone lesion.

Conus medullaris and cauda equina: Conus extends to the T12-L1
level. Conus and cauda equina appear normal.

Paraspinal and other soft tissues: Negative.

Disc levels:

Disc spaces: Degenerative disease with disc height loss at L4-5 and
L5-S1. Disc desiccation throughout the lumbar spine.

T12-L1: Minimal broad-based disc bulge. No evidence of neural
foraminal stenosis. No central canal stenosis.

L1-L2: Minimal broad-based disc bulge. Mild bilateral facet
arthropathy. No evidence of neural foraminal stenosis. No central
canal stenosis.

L2-L3: Mild broad-based disc bulge. Mild bilateral facet
arthropathy. No evidence of neural foraminal stenosis. No central
canal stenosis.

L3-L4: Mild broad-based disc bulge. Severe right and moderate left
facet arthropathy. No evidence of neural foraminal stenosis. No
central canal stenosis.

L4-L5: Broad-based disc bulge. Mild bilateral facet arthropathy.
Mild bilateral foraminal narrowing. No central canal stenosis.

L5-S1: No significant disc protrusion. Mild bilateral facet
arthropathy. Severe bilateral foraminal stenosis. No central canal
stenosis.
IMPRESSION: 1. At L5-S1 there is mild bilateral facet arthropathy. Severe
bilateral foraminal stenosis. Grade 1 anterolisthesis of L5 on S1
secondary to bilateral L5 pars interarticularis defects.
2. Lumbar spine spondylosis as described above.
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
## 2020-07-05 NOTE — Telephone Encounter (Signed)
MRI reveals severe narrowing of the nerve openings at the L5-S1 level due to the shift of the vertebrae that we saw on x-rays.  The nerves could be getting pinched depending on what you're doing.  There's some arthritis and disc bulging at other levels, but no significant nerve compression.  No indication for surgery at this point, unless conservative management fails.  Presuming you're still in pain, referral for an injection may be beneficial.

## 2020-08-27 ENCOUNTER — Ambulatory Visit: Payer: Medicare Other | Admitting: Endocrinology

## 2020-09-10 ENCOUNTER — Ambulatory Visit (INDEPENDENT_AMBULATORY_CARE_PROVIDER_SITE_OTHER): Payer: Medicare Other | Admitting: Endocrinology

## 2020-09-10 ENCOUNTER — Other Ambulatory Visit: Payer: Self-pay

## 2020-09-10 VITALS — BP 130/80 | HR 95 | Ht 65.0 in | Wt 212.4 lb

## 2020-09-10 DIAGNOSIS — E213 Hyperparathyroidism, unspecified: Secondary | ICD-10-CM

## 2020-09-10 LAB — VITAMIN D 25 HYDROXY (VIT D DEFICIENCY, FRACTURES): VITD: 37.96 ng/mL (ref 30.00–100.00)

## 2020-09-10 NOTE — Progress Notes (Signed)
Subjective:    Patient ID: Christy Young, female    DOB: [birth year], 75 y.o.   MRN: [id number]  HPI Pt returns for f/u of primary hyperparathyroidism (dx'ed 2018; DEXA in 2021 was normal; she has never had urolithiasis; she takes vit-D, 50000 units/week).  She has muscle cramps and back pain. Past Medical History:  Diagnosis Date  . ARTHRITIS   . Carpal tunnel syndrome 08/23/2015   Left  . Fatigue   . Gout, unspecified   . Hyperlipidemia   . Hypertension    meds since age 66   . Hyperuricemia   . Nonspecific abnormal electrocardiogram (ECG) (EKG)    t wave non acute    . OBESITY   . Palpitations    hospitalized 2005 felt from stress neg cards eval  . 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 ABDOMINAL HYSTERECTOMY    . BREAST BIOPSY     right side  . FACIAL COSMETIC SURGERY  1980    Social History   Socioeconomic History  . Marital status: Single    Spouse name: Not on file  . Number of children: Not on file  . Years of education: Not on file  . Highest education level: Not on file  Occupational History  . Occupation: retired   Tobacco Use  . Smoking status: Never Smoker  . Smokeless tobacco: Never Used  Vaping Use  . Vaping Use: Never used  Substance and Sexual Activity  . Alcohol use: Yes    Comment: qo pm may drink a glass of wine   . Drug use: No  . Sexual activity: Not on file  Other Topics Concern  . Not on file  Social History Narrative   Occupation: retired Event organiser, 3 yrs of college   Bereaved parent   [place] in January 01, 2023    No pets   [id number]   Sis died of cancer lymphoma 54  and bro now with lung cancer and spread.died Jun 25, 2023.   Social Determinants of Health   Financial Resource Strain: Low Risk   . Difficulty of Paying Living Expenses: Not hard at all  Food Insecurity: No Food Insecurity  . Worried About Charity fundraiser in the Last Year: Never true  .  Ran Out of Food in the Last Year: Never true  Transportation Needs: No Transportation Needs  . Lack of Transportation (Medical): No  . Lack of Transportation (Non-Medical): No  Physical Activity: Insufficiently Active  . Days of Exercise per Week: 5 days  . Minutes of Exercise per Session: 20 min  Stress: No Stress Concern Present  . Feeling of Stress : Not at all  Social Connections: Moderately Isolated  . Frequency of Communication with Friends and Family: More than three times a week  . Frequency of Social Gatherings with Friends and Family: More than three times a week  . Attends Religious Services: Never  . Active Member of Clubs or Organizations: Yes  . Attends Archivist Meetings: 1 to 4 times per year  . Marital Status: Never married  Intimate Partner Violence: Not At Risk  . Fear of Current or Ex-Partner: No  . Emotionally Abused: No  . Physically Abused: No  . Sexually Abused: No    Current Outpatient Medications on File Prior to Visit  Medication Sig Dispense Refill  . allopurinol (ZYLOPRIM) 100 MG tablet Take 2 tablets (200  mg total) by mouth daily. 180 tablet 0  . baclofen (LIORESAL) 10 MG tablet Take 0.5-1 tablets (5-10 mg total) by mouth 3 (three) times daily as needed for muscle spasms. 30 each 3  . BYSTOLIC 5 MG tablet TAKE 1 TABLET DAILY 90 tablet 1  . Fluocinolone Acetonide Body 0.01 % OIL Apply topically.    . Omega-3 Fatty Acids (FISH OIL) 1000 MG CPDR Take by mouth.    . pravastatin (PRAVACHOL) 20 MG tablet TAKE 1 TABLET BY MOUTH EVERY DAY 90 tablet 1  . predniSONE (DELTASONE) 10 MG tablet Take as directed for 12 days.  Daily dose 6,6,5,5,4,4,3,3,2,2,1,1. 42 tablet 0  . traMADol (ULTRAM) 50 MG tablet Take 1 tablet (50 mg total) by mouth every 6 (six) hours as needed. 30 tablet 0  . Vitamin D, Ergocalciferol, (DRISDOL) 1.25 MG (50000 UNIT) CAPS capsule TAKE 1 CAPSULE BY MOUTH EVERY 7 DAYS. 12 capsule 0   No current facility-administered medications on  file prior to visit.    Allergies  Allergen Reactions  . Pravastatin Other (See Comments)    Body MS aches and pain s  . Rosuvastatin     REACTION: leg cramps.  . Penicillins Other (See Comments)    Patient  says she is not allergic to amoxicillin or augmentin   Has taken these wo problem ? Of SE when she was 85  Not sever     Family History  Problem Relation Age of Onset  . Hypertension Mother   . Arthritis Mother   . Alcohol abuse Father        deceased  . Diabetes Father   . Lymphoma Sister        non hodgkins  . Heart attack Son 73       1999  . Lung cancer Brother   . Hyperparathyroidism Neg Hx     BP 130/80 (BP Location: Right Arm, Patient Position: Sitting, Cuff Size: Large)   Pulse 95   Ht 5\' 5"  (1.651 m)   Wt 212 lb 6.4 oz (96.3 kg)   SpO2 93%   BMI 35.35 kg/m    Review of Systems  Denies numbness.    Objective:   Physical Exam VITAL SIGNS:  See vs page GENERAL: no distress NECK: There is no palpable thyroid enlargement.  No nodule is palpable.  No palpable lymphadenopathy at the anterior neck. GAIT: normal and steady.    25-OH-Vit-D=38     Assessment & Plan:  Vit-D def: well-controlled.  Please continue the same ergocalciferol Primary hyperparathyroidism: recheck today.    Patient Instructions  Blood tests are requested for you today.  We'll let you know about the results. So far, the parathyroid does not need surgery.    Please come back for a follow-up appointment in 6 months.

## 2020-09-10 NOTE — Patient Instructions (Signed)
Blood tests are requested for you today.  We'll let you know about the results. So far, the parathyroid does not need surgery.    Please come back for a follow-up appointment in 6 months.

## 2020-09-13 LAB — PTH, INTACT AND CALCIUM
Calcium: 10.9 mg/dL — ABNORMAL HIGH (ref 8.6–10.4)
PTH: 238 pg/mL — ABNORMAL HIGH (ref 16–77)

## 2020-10-05 ENCOUNTER — Other Ambulatory Visit: Payer: Self-pay | Admitting: Internal Medicine

## 2020-10-05 DIAGNOSIS — Z23 Encounter for immunization: Secondary | ICD-10-CM | POA: Diagnosis not present

## 2020-12-27 ENCOUNTER — Other Ambulatory Visit: Payer: Self-pay | Admitting: Internal Medicine

## 2021-01-26 ENCOUNTER — Telehealth: Payer: Self-pay | Admitting: Family Medicine

## 2021-01-26 DIAGNOSIS — G8929 Other chronic pain: Secondary | ICD-10-CM

## 2021-01-26 NOTE — Telephone Encounter (Signed)
Please advise 

## 2021-01-26 NOTE — Telephone Encounter (Signed)
I called and advised the patient. One of Dr. Romona Curls schedulers will be contacting her about an appointment.

## 2021-01-26 NOTE — Telephone Encounter (Signed)
Patient called advised her lower back and left leg is hurting pretty bad. Patient said she would like to get referred for the injections. The number to contact patient  is [phone]

## 2021-01-27 ENCOUNTER — Telehealth: Payer: Medicare Other | Admitting: Internal Medicine

## 2021-02-14 ENCOUNTER — Encounter: Payer: Self-pay | Admitting: Physical Medicine and Rehabilitation

## 2021-02-14 ENCOUNTER — Other Ambulatory Visit: Payer: Self-pay

## 2021-02-14 ENCOUNTER — Ambulatory Visit: Payer: Self-pay

## 2021-02-14 ENCOUNTER — Ambulatory Visit (INDEPENDENT_AMBULATORY_CARE_PROVIDER_SITE_OTHER): Payer: Medicare Other | Admitting: Physical Medicine and Rehabilitation

## 2021-02-14 VITALS — BP 134/81 | HR 75

## 2021-02-14 DIAGNOSIS — M4316 Spondylolisthesis, lumbar region: Secondary | ICD-10-CM

## 2021-02-14 DIAGNOSIS — M5416 Radiculopathy, lumbar region: Secondary | ICD-10-CM | POA: Diagnosis not present

## 2021-02-14 DIAGNOSIS — Q762 Congenital spondylolisthesis: Secondary | ICD-10-CM

## 2021-02-14 DIAGNOSIS — M48061 Spinal stenosis, lumbar region without neurogenic claudication: Secondary | ICD-10-CM

## 2021-02-14 MED ORDER — BETAMETHASONE SOD PHOS & ACET 6 (3-3) MG/ML IJ SUSP
12.0000 mg | Freq: Once | INTRAMUSCULAR | Status: AC
  Administered 2021-02-14: 12 mg

## 2021-02-14 NOTE — Progress Notes (Signed)
Christy Young - 75 y.o. female MRN [id number]  Date of birth: 11-Apr-1946  Office Visit Note: Visit Date: 02/14/2021 PCP: Burnis Medin, MD Referred by: Burnis Medin, MD  Subjective: Chief Complaint  Patient presents with   Lower Back - Pain   Left Hip - Pain   Left Leg - Pain   HPI:  Christy Young is a 75 y.o. female who comes in today at the request of Dr. Eunice Blase for planned Left L5-S1 Lumbar Interlaminar epidural steroid injection with fluoroscopic guidance.  The patient has failed conservative care including home exercise, medications, time and activity modification.  This injection will be diagnostic and hopefully therapeutic.  Please see requesting physician notes for further details and justification. MRI reviewed with images and spine model.  MRI reviewed in the note below.  Consider transforaminal approach if not successful.     ROS Otherwise per HPI.  Assessment & Plan: Visit Diagnoses:    ICD-10-CM   1. 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   Meds & Orders:  Meds ordered this encounter  Medications   betamethasone acetate-betamethasone sodium phosphate (CELESTONE) injection 12 mg    Orders Placed This Encounter  Procedures   XR C-ARM NO REPORT   Epidural Steroid injection    Follow-up: Return if symptoms worsen or fail to improve.   Procedures: No procedures performed  Lumbar Epidural Steroid Injection - Interlaminar Approach with Fluoroscopic Guidance  Patient: Christy Young      Date of Birth: 1945/11/19 MRN: [id number] PCP: Burnis Medin, MD      Visit Date: 02/14/2021   Universal Protocol:     Consent Given By: the  patient  Position: PRONE  Additional Comments: Vital signs were monitored before and after the procedure. Patient was prepped and draped in the usual sterile fashion. The correct patient, procedure, and site was verified.   Injection Procedure Details:   Procedure diagnoses: Lumbar radiculopathy [M54.16]   Meds Administered:  Meds ordered this encounter  Medications   betamethasone acetate-betamethasone sodium phosphate (CELESTONE) injection 12 mg     Laterality: Left  Location/Site:  L5-S1  Needle: 4.5 in., 20 ga. Tuohy  Needle Placement: Paramedian epidural  Findings:   -Comments: Excellent flow of contrast into the epidural space.  Procedure Details: Using a paramedian approach from the side mentioned above, the region overlying the inferior lamina was localized under fluoroscopic visualization and the soft tissues overlying this structure were infiltrated with 4 ml. of 1% Lidocaine without Epinephrine. The Tuohy needle was inserted into the epidural space using a paramedian approach.   The epidural space was localized using loss of resistance along with counter oblique bi-planar fluoroscopic views.  After negative aspirate for air, blood, and CSF, a 2 ml. volume of Isovue-250 was injected into the epidural space and the flow of contrast was observed. Radiographs were obtained for documentation purposes.    The injectate was administered into the level noted above.   Additional Comments:  The patient tolerated the procedure well Dressing: 2 x 2 sterile gauze and Band-Aid    Post-procedure details: Patient was observed during the procedure. Post-procedure  instructions were reviewed.  Patient left the clinic in stable condition.    Clinical History: MRI LUMBAR SPINE WITHOUT CONTRAST   TECHNIQUE: Multiplanar, multisequence MR imaging of the lumbar spine was performed. No intravenous contrast was administered.   COMPARISON:  None.   FINDINGS: Segmentation:   Standard.   Alignment: Grade 1 anterolisthesis of L5 on S1 secondary to bilateral L5 pars interarticularis defects.   Vertebrae:  No fracture, evidence of discitis, or bone lesion.   Conus medullaris and cauda equina: Conus extends to the T12-L1 level. Conus and cauda equina appear normal.   Paraspinal and other soft tissues: Negative.   Disc levels:   Disc spaces: Degenerative disease with disc height loss at L4-5 and L5-S1. Disc desiccation throughout the lumbar spine.   T12-L1: Minimal broad-based disc bulge. No evidence of neural foraminal stenosis. No central canal stenosis.   L1-L2: Minimal broad-based disc bulge. Mild bilateral facet arthropathy. No evidence of neural foraminal stenosis. No central canal stenosis.   L2-L3: Mild broad-based disc bulge. Mild bilateral facet arthropathy. No evidence of neural foraminal stenosis. No central canal stenosis.   L3-L4: Mild broad-based disc bulge. Severe right and moderate left facet arthropathy. No evidence of neural foraminal stenosis. No central canal stenosis.   L4-L5: Broad-based disc bulge. Mild bilateral facet arthropathy. Mild bilateral foraminal narrowing. No central canal stenosis.   L5-S1: No significant disc protrusion. Mild bilateral facet arthropathy. Severe bilateral foraminal stenosis. No central canal stenosis.   IMPRESSION: 1. At L5-S1 there is mild bilateral facet arthropathy. Severe bilateral foraminal stenosis. Grade 1 anterolisthesis of L5 on S1 secondary to bilateral L5 pars interarticularis defects. 2. Lumbar spine spondylosis as described above.     Electronically Signed   By: Kathreen Devoid   On: 07/05/2020 11:36     Objective:  VS:  HT:    WT:   BMI:     BP:134/81  HR:75bpm  TEMP: ( )  RESP:  Physical Exam Vitals and nursing note reviewed.  Constitutional:      General: She is not in acute distress.    Appearance: Normal appearance. She is obese. She is not ill-appearing.  HENT:      Head: Normocephalic and atraumatic.     Right Ear: External ear normal.     Left Ear: External ear normal.  Eyes:     Extraocular Movements: Extraocular movements intact.  Cardiovascular:     Rate and Rhythm: Normal rate.     Pulses: Normal pulses.  Pulmonary:     Effort: Pulmonary effort is normal. No respiratory distress.  Abdominal:     General: There is no distension.     Palpations: Abdomen is soft.  Musculoskeletal:        General: Tenderness present.     Cervical back: Neck supple.     Right lower leg: No edema.     Left lower leg: No edema.     Comments: Patient has good distal strength with no pain over the greater trochanters.  No clonus or focal weakness.  Skin:    Findings: No erythema, lesion or rash.  Neurological:     General: No focal deficit present.     Mental Status: She is alert and oriented to person, place, and time.     Sensory: No sensory deficit.     Motor: No weakness or abnormal muscle tone.     Coordination: Coordination normal.  Psychiatric:        Mood and Affect: Mood normal.  Behavior: Behavior normal.     Imaging: XR C-ARM NO REPORT  Result Date: 02/14/2021 Please see Notes tab for imaging impression.

## 2021-02-14 NOTE — Patient Instructions (Signed)

## 2021-02-14 NOTE — Procedures (Signed)
Lumbar Epidural Steroid Injection - Interlaminar Approach with Fluoroscopic Guidance  Patient: Christy Young      Date of Birth: Nov 18, 1945 MRN: [id number] PCP: Burnis Medin, MD      Visit Date: 02/14/2021   Universal Protocol:     Consent Given By: the patient  Position: PRONE  Additional Comments: Vital signs were monitored before and after the procedure. Patient was prepped and draped in the usual sterile fashion. The correct patient, procedure, and site was verified.   Injection Procedure Details:   Procedure diagnoses: Lumbar radiculopathy [M54.16]   Meds Administered:  Meds ordered this encounter  Medications   betamethasone acetate-betamethasone sodium phosphate (CELESTONE) injection 12 mg     Laterality: Left  Location/Site:  L5-S1  Needle: 4.5 in., 20 ga. Tuohy  Needle Placement: Paramedian epidural  Findings:   -Comments: Excellent flow of contrast into the epidural space.  Procedure Details: Using a paramedian approach from the side mentioned above, the region overlying the inferior lamina was localized under fluoroscopic visualization and the soft tissues overlying this structure were infiltrated with 4 ml. of 1% Lidocaine without Epinephrine. The Tuohy needle was inserted into the epidural space using a paramedian approach.   The epidural space was localized using loss of resistance along with counter oblique bi-planar fluoroscopic views.  After negative aspirate for air, blood, and CSF, a 2 ml. volume of Isovue-250 was injected into the epidural space and the flow of contrast was observed. Radiographs were obtained for documentation purposes.    The injectate was administered into the level noted above.   Additional Comments:  The patient tolerated the procedure well Dressing: 2 x 2 sterile gauze and Band-Aid    Post-procedure details: Patient was observed during the procedure. Post-procedure instructions were reviewed.  Patient left the clinic  in stable condition.

## 2021-02-14 NOTE — Progress Notes (Signed)
Pt state lower back pain that travels to her left hip, down her leg and knee. Pt state walking, standing and laying down makes the pain worse. 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 P  Numeric Pain Rating Scale and Functional Assessment Average Pain 7   In the last MONTH (on 0-10 scale) has pain interfered with the following?  1. General activity like being  able to carry out your everyday physical activities such as walking, climbing stairs, carrying groceries, or moving a chair?  Rating(10)   +Driver, -BT, -Dye Allergies.

## 2021-02-16 ENCOUNTER — Other Ambulatory Visit: Payer: Self-pay

## 2021-02-16 ENCOUNTER — Encounter: Payer: Self-pay | Admitting: Internal Medicine

## 2021-02-16 ENCOUNTER — Ambulatory Visit (INDEPENDENT_AMBULATORY_CARE_PROVIDER_SITE_OTHER): Payer: Medicare Other | Admitting: Internal Medicine

## 2021-02-16 VITALS — BP 146/80 | HR 83 | Temp 99.1°F | Ht 65.0 in | Wt 216.6 lb

## 2021-02-16 DIAGNOSIS — Z1211 Encounter for screening for malignant neoplasm of colon: Secondary | ICD-10-CM

## 2021-02-16 DIAGNOSIS — R6889 Other general symptoms and signs: Secondary | ICD-10-CM | POA: Diagnosis not present

## 2021-02-16 DIAGNOSIS — R059 Cough, unspecified: Secondary | ICD-10-CM | POA: Diagnosis not present

## 2021-02-16 DIAGNOSIS — R0600 Dyspnea, unspecified: Secondary | ICD-10-CM | POA: Diagnosis not present

## 2021-02-16 DIAGNOSIS — R222 Localized swelling, mass and lump, trunk: Secondary | ICD-10-CM

## 2021-02-16 DIAGNOSIS — Z79899 Other long term (current) drug therapy: Secondary | ICD-10-CM | POA: Diagnosis not present

## 2021-02-16 DIAGNOSIS — E210 Primary hyperparathyroidism: Secondary | ICD-10-CM | POA: Diagnosis not present

## 2021-02-16 DIAGNOSIS — I10 Essential (primary) hypertension: Secondary | ICD-10-CM | POA: Diagnosis not present

## 2021-02-16 DIAGNOSIS — L409 Psoriasis, unspecified: Secondary | ICD-10-CM | POA: Diagnosis not present

## 2021-02-16 DIAGNOSIS — E785 Hyperlipidemia, unspecified: Secondary | ICD-10-CM

## 2021-02-16 NOTE — Patient Instructions (Signed)
You will get  contact about cardiology consults about your chest symptoms and shortness of breath .   Plan chest x ray and are of the  claviccle  joint   Order placed for  elam Lucama  x ray   any time  m-f 8- 5   Lab today . Plan follow up  and depending on all of these evaluations.   Your lung exam is good today .

## 2021-02-16 NOTE — Progress Notes (Signed)
Chief Complaint  Patient presents with   Arm Pain    Patient complains of right arm pain, x2 days , Patient reports arm has been sore after receiving injection.    Cough    Patient complains of cough, x1 month, Patient denies Covid-19    HPI: Christy Young 75 y.o. come in for multiple concerns today  Seeing  endo Dr. Loanne Drilling for hyper parathyroid.  Levels following had a bone density Had back  scaitica  injecction got better . Moves around out of breath  ?  Gained weight with stseroid. Frequent urination probably from the hyperparathyroidism no change.  Ongoing cough  night off and on for a month without associated fever illness hemoptysis upper chest feels sore.  Has noted a change in exercise tolerance when she walks up steps she is more short of breath and sometimes makes noises.  She has gained some weight she feels related to the inert activity and the prednisone treatment that was required for her severe back flare.  No history of asthma as a child COPD or tobacco.  Area at the right clavicular sternal junction is gotten more prominent.  Denies sleep apnea symptoms although does have some snoring. Has psoriasis has been to the dermatologist Right arm sometimes pain down to her hand. Change in exercise tolerance she used to be able to go up 2 flights of stairs now is tired Does have some nighttime supine choking denies any reflux symptoms.  ROS: See pertinent positives and negatives per HPI.  No complaints of racing heart or tachycardia that she is aware of.  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     Family History  Problem Relation Age of Onset   Hypertension Mother    Arthritis Mother    Alcohol abuse Father        deceased   Diabetes Father    Lymphoma Sister        non hodgkins   Heart attack Son 31       1999   Lung cancer Brother    Hyperparathyroidism Neg Hx     Social History   Socioeconomic History   Marital status: Single    Spouse name: Not on file   Number of children: Not on file   Years of education: Not on file   Highest education level: Not on file  Occupational History   Occupation: retired   Tobacco Use   Smoking status: Never   Smokeless tobacco: Never  Vaping Use   Vaping Use: Never used  Substance and Sexual Activity   Alcohol use: Yes    Comment: qo pm may drink a glass of wine    Drug use: No   Sexual activity: Not on file  Other Topics Concern   Not on file  Social History Narrative   Occupation: retired Event organiser, 3 yrs of college   Bereaved parent   Thomas in June    No pets   G3P2  Sis died of cancer lymphoma 46  and bro now with lung cancer and spread.died 07-02-23.   Social Determinants of Health   Financial Resource Strain: Low Risk    Difficulty of Paying Living Expenses: Not hard at all  Food Insecurity: No Food Insecurity   Worried About Charity fundraiser in the Last Year: Never true   [place] in the Last Year: Never true  Transportation Needs: No Transportation Needs   Lack of Transportation (Medical): No   Lack of Transportation (Non-Medical): No  Physical Activity: Insufficiently Active   Days of Exercise per Week: 5 days   Minutes of Exercise per Session: 20 min  Stress: No Stress Concern Present   Feeling of Stress : Not at all  Social Connections: Moderately Isolated   Frequency of Communication with Friends and Family: More than three times a week   Frequency of Social Gatherings with Friends and Family: More than three times a week   Attends Religious Services: Never   Building surveyor or Organizations: Yes   Attends Archivist Meetings: 1 to 4 times per year   Marital Status: Never married    Outpatient Medications Prior to Visit  Medication Sig Dispense Refill   allopurinol (ZYLOPRIM) 100 MG tablet Take 2 tablets (200 mg total) by mouth daily. 180 tablet 0   baclofen (LIORESAL) 10 MG tablet Take 0.5-1 tablets (5-10 mg total) by mouth 3 (three) times daily as needed for muscle spasms. 30 each 3   Fluocinolone Acetonide Body 0.01 % OIL Apply topically.     nebivolol (BYSTOLIC) 5 MG tablet TAKE 1 TABLET DAILY 90 tablet 0   Omega-3 Fatty Acids (FISH OIL) 1000 MG CPDR Take by mouth.     pravastatin (PRAVACHOL) 20 MG tablet TAKE 1 TABLET BY MOUTH EVERY DAY 90 tablet 1   predniSONE (DELTASONE) 10 MG tablet Take as directed for 12 days.  Daily dose 6,6,5,5,4,4,3,3,2,2,1,1. 42 tablet 0   traMADol (ULTRAM) 50 MG tablet Take 1 tablet (50 mg total) by mouth every 6 (six) hours as needed. 30 tablet 0   Vitamin D, Ergocalciferol, (DRISDOL) 1.25 MG (50000 UNIT) CAPS capsule TAKE 1 CAPSULE BY MOUTH EVERY 7 DAYS. 12 capsule 0   No facility-administered medications prior to visit.     EXAM:  BP (!) 146/80 (BP Location: Left Arm, Patient Position: Sitting, Cuff Size: Normal)   Pulse 83   Temp 99.1 F (37.3 C) (Oral)   Ht '5\' 5"'$  (1.651 m)   Wt 216 lb 9.6 oz (98.2 kg)   SpO2 95%   BMI 36.04 kg/m   Body mass index is 36.04 kg/m.  GENERAL: vitals reviewed and listed above, alert, oriented, appears well hydrated and in no acute distress HEENT: atraumatic, conjunctiva  clear, no obvious abnormalities on inspection of external nose and ears OP : Mast NECK: no obvious masses on inspection palpation  LUNGS: clear to auscultation bilaterally, no wheezes, rales or rhonchi, good air movement right sternoclavicular area very prominent possible osteophyte but no lymphadenopathy. CV: HRRR, no clubbing cyanosis or  peripheral edema nl cap refill  Abdomen soft  without organomegaly obvious. Skin no acute bruising or bleeding.  No petechiae extensor elbow faded rash a little bit scaly. MS: moves all extremities without noticeable acute focal  abnormality PSYCH: pleasant and cooperative, no obvious depression or anxiety Lab Results  Component Value Date   WBC 6.1 02/18/2020   HGB 13.5 02/18/2020   HCT  40.6 02/18/2020   PLT 273 02/18/2020   GLUCOSE 92 02/18/2020   CHOL 225 (H) 02/18/2020   TRIG 119 02/18/2020   HDL 55 02/18/2020   LDLDIRECT 180.4 04/11/2013   LDLCALC 146 (H) 02/18/2020   ALT 20 02/18/2020   AST 15 02/18/2020   NA 141 02/18/2020   K 4.2 02/18/2020   CL 106 02/18/2020   CREATININE 0.66 02/18/2020   BUN 12 02/18/2020   CO2 28 02/18/2020   TSH 1.65 02/18/2020   HGBA1C 5.5 02/18/2020   MICROALBUR 1.4 12/01/2009   BP Readings from Last 3 Encounters:  02/16/21 (!) 146/80  02/14/21 134/81  09/10/20 130/80    ASSESSMENT AND PLAN:  Discussed the following assessment and plan:  Dyspnea, unspecified type - Plan: Basic metabolic panel, CBC with Differential/Platelet, Hemoglobin A1c, Hepatic function panel, Lipid panel, TSH, T4, free, Uric acid, Sedimentation rate, C-reactive protein, DG Chest 2 View, DG Clavicle Right, Ambulatory referral to Cardiology, C-reactive protein, Sedimentation rate, Uric acid, T4, free, TSH, Lipid panel, Hepatic function panel, Hemoglobin A1c, CBC with Differential/Platelet, Basic metabolic panel  Cough - Plan: Basic metabolic panel, CBC with Differential/Platelet, Hemoglobin A1c, Hepatic function panel, Lipid panel, TSH, T4, free, Uric acid, Sedimentation rate, C-reactive protein, DG Chest 2 View, DG Clavicle Right, C-reactive protein, Sedimentation rate, Uric acid, T4, free, TSH, Lipid panel, Hepatic function panel, Hemoglobin A1c, CBC with Differential/Platelet, Basic metabolic panel  Primary hyperparathyroidism (HCC) - Plan: Basic metabolic panel, CBC with Differential/Platelet, Hemoglobin A1c,  Hepatic function panel, Lipid panel, TSH, T4, free, Uric acid, Sedimentation rate, C-reactive protein, C-reactive protein, Sedimentation rate, Uric acid, T4, free, TSH, Lipid panel, Hepatic function panel, Hemoglobin A1c, CBC with Differential/Platelet, Basic metabolic panel  Medication management - Plan: Basic metabolic panel, CBC with Differential/Platelet, Hemoglobin A1c, Hepatic function panel, Lipid panel, TSH, T4, free, Uric acid, Sedimentation rate, C-reactive protein, C-reactive protein, Sedimentation rate, Uric acid, T4, free, TSH, Lipid panel, Hepatic function panel, Hemoglobin A1c, CBC with Differential/Platelet, Basic metabolic panel  Psoriasis - Plan: Basic metabolic panel, CBC with Differential/Platelet, Hemoglobin A1c, Hepatic function panel, Lipid panel, TSH, T4, free, Uric acid, Sedimentation rate, C-reactive protein, C-reactive protein, Sedimentation rate, Uric acid, T4, free, TSH, Lipid panel, Hepatic function panel, Hemoglobin A1c, CBC with Differential/Platelet, Basic metabolic panel  Hyperlipidemia, unspecified hyperlipidemia type - Plan: Basic metabolic panel, CBC with Differential/Platelet, Hemoglobin A1c, Hepatic function panel, Lipid panel, TSH, T4, free, Uric acid, Sedimentation rate, C-reactive protein, Ambulatory referral to Cardiology, C-reactive protein, Sedimentation rate, Uric acid, T4, free, TSH, Lipid panel, Hepatic function panel, Hemoglobin A1c, CBC with Differential/Platelet, Basic metabolic panel  Essential hypertension - Plan: Basic metabolic panel, CBC with Differential/Platelet, Hemoglobin A1c, Hepatic function panel, Lipid panel, TSH, T4, free, Uric acid, Sedimentation rate, C-reactive protein, Ambulatory referral to Cardiology, C-reactive protein, Sedimentation rate, Uric acid, T4, free, TSH, Lipid panel, Hepatic function panel, Hemoglobin A1c, CBC with Differential/Platelet, Basic metabolic panel  Lump in chest - right sternoclavicular joint  - Plan: Basic  metabolic panel, CBC with Differential/Platelet, Hemoglobin A1c, Hepatic function panel, Lipid panel, TSH, T4, free, Uric acid, Sedimentation rate, C-reactive protein, DG Chest 2 View, DG Clavicle Right, C-reactive protein, Sedimentation rate, Uric acid, T4, free, TSH, Lipid panel, Hepatic function panel, Hemoglobin A1c, CBC with Differential/Platelet, Basic metabolic panel  Colon cancer screening - Plan: Ambulatory referral to Gastroenterology  Decreased exercise tolerance - Plan: Ambulatory referral to Cardiology Ms. Vess has a number of concerns today but the most pressing are the change in exercise tolerance ,shortness of  breath she states could be from her weight gain but is concerned about this and agrees that a cardiac reassessment is .  She has a minor cough but is persistent for a month without symptoms of COVID or predating or lung disease. Could also consider silent esophageal reflux based on her symptom description. Plan chest x-ray clavicular x-ray if concerning we can go to CT scan Referral for cardiovascular evaluation for change in exercise tolerance Updated labs today she is not fasting ate a few hours ago beverage was coffee.  With creamer. Blood pressure slightly up in office today she can check when at home has been in control. In addition she would like referral for colonoscopy routine colon cancer screening and she states due her only overdue reported Record preview evaluation of multiple problems and time 45 minutes -Patient advised to return or notify health care team  if  new concerns arise.  Patient Instructions  You will get  contact about cardiology consults about your chest symptoms and shortness of breath .   Plan chest x ray and are of the  claviccle  joint   Order placed for  [facility] [place]  x ray   any time  m-f 8- 5   Lab today . Plan follow up  and depending on all of these evaluations.   Your lung exam is good today .   Standley Brooking. Gao Mitnick M.D.

## 2021-02-17 LAB — LIPID PANEL
Cholesterol: 289 mg/dL — ABNORMAL HIGH (ref 0–200)
HDL: 57 mg/dL (ref >39.00)
NonHDL: 232.4
Total CHOL/HDL Ratio: 5
Triglycerides: 326 mg/dL — ABNORMAL HIGH (ref 0.0–149.0)
VLDL: 65.2 mg/dL — ABNORMAL HIGH (ref 0.0–40.0)

## 2021-02-17 LAB — CBC WITH DIFFERENTIAL/PLATELET
Basophils Absolute: 0.1 10*3/uL (ref 0.0–0.1)
Basophils Relative: 0.8 % (ref 0.0–3.0)
Eosinophils Absolute: 0 10*3/uL (ref 0.0–0.7)
Eosinophils Relative: 0.3 % (ref 0.0–5.0)
HCT: 40.4 % (ref 36.0–46.0)
Hemoglobin: 13.4 g/dL (ref 12.0–15.0)
Lymphocytes Relative: 23.1 % (ref 12.0–46.0)
Lymphs Abs: 2.6 10*3/uL (ref 0.7–4.0)
MCHC: 33.2 g/dL (ref 30.0–36.0)
MCV: 90.4 fl (ref 78.0–100.0)
Monocytes Absolute: 1 10*3/uL (ref 0.1–1.0)
Monocytes Relative: 9 % (ref 3.0–12.0)
Neutro Abs: 7.6 10*3/uL (ref 1.4–7.7)
Neutrophils Relative %: 66.8 % (ref 43.0–77.0)
Platelets: 285 10*3/uL (ref 150.0–400.0)
RBC: 4.47 Mil/uL (ref 3.87–5.11)
RDW: 14.1 % (ref 11.5–15.5)
WBC: 11.4 10*3/uL — ABNORMAL HIGH (ref 4.0–10.5)

## 2021-02-17 LAB — HEPATIC FUNCTION PANEL
ALT: 23 U/L (ref 0–35)
AST: 18 U/L (ref 0–37)
Albumin: 4 g/dL (ref 3.5–5.2)
Alkaline Phosphatase: 62 U/L (ref 39–117)
Bilirubin, Direct: 0.1 mg/dL (ref 0.0–0.3)
Total Bilirubin: 0.6 mg/dL (ref 0.2–1.2)
Total Protein: 6.5 g/dL (ref 6.0–8.3)

## 2021-02-17 LAB — BASIC METABOLIC PANEL
BUN: 16 mg/dL (ref 6–23)
CO2: 26 mEq/L (ref 19–32)
Calcium: 11.2 mg/dL — ABNORMAL HIGH (ref 8.4–10.5)
Chloride: 106 mEq/L (ref 96–112)
Creatinine, Ser: 0.79 mg/dL (ref 0.40–1.20)
GFR: 73.22 mL/min (ref >60.00)
Glucose, Bld: 89 mg/dL (ref 70–99)
Potassium: 3.9 mEq/L (ref 3.5–5.1)
Sodium: 143 mEq/L (ref 135–145)

## 2021-02-17 LAB — C-REACTIVE PROTEIN: CRP: <1 mg/dL (ref 0.5–20.0)

## 2021-02-17 LAB — TSH: TSH: 0.34 u[IU]/mL — ABNORMAL LOW (ref 0.35–5.50)

## 2021-02-17 LAB — SEDIMENTATION RATE: Sed Rate: 30 mm/hr (ref 0–30)

## 2021-02-17 LAB — LDL CHOLESTEROL, DIRECT: Direct LDL: 193 mg/dL

## 2021-02-17 LAB — HEMOGLOBIN A1C: Hgb A1c MFr Bld: 5.9 % (ref 4.6–6.5)

## 2021-02-17 LAB — T4, FREE: Free T4: 0.65 ng/dL (ref 0.60–1.60)

## 2021-02-17 LAB — URIC ACID: Uric Acid, Serum: 6.1 mg/dL (ref 2.4–7.0)

## 2021-02-20 NOTE — Progress Notes (Signed)
Cholesterol is very high  enough to advise medication . ( You can discuss this with cardiology or we can try a lower dose  of a different medication )  calcium is still 11 range .  New finding : thyroid  results shows poss overactive thyroid.  I am sending results to dr Loanne Drilling and have him follow up on these findings.   Await cardiology opinion

## 2021-02-21 ENCOUNTER — Ambulatory Visit (INDEPENDENT_AMBULATORY_CARE_PROVIDER_SITE_OTHER)
Admission: RE | Admit: 2021-02-21 | Discharge: 2021-02-21 | Disposition: A | Discharge status: Home / Self Care | Payer: Medicare Other | Source: Ambulatory Visit | Attending: Internal Medicine | Admitting: Internal Medicine

## 2021-02-21 ENCOUNTER — Other Ambulatory Visit: Payer: Medicare Other

## 2021-02-21 ENCOUNTER — Other Ambulatory Visit: Payer: Self-pay

## 2021-02-21 DIAGNOSIS — R059 Cough, unspecified: Secondary | ICD-10-CM

## 2021-02-21 DIAGNOSIS — R222 Localized swelling, mass and lump, trunk: Secondary | ICD-10-CM

## 2021-02-21 DIAGNOSIS — I700 Atherosclerosis of aorta: Secondary | ICD-10-CM | POA: Diagnosis not present

## 2021-02-21 DIAGNOSIS — R0600 Dyspnea, unspecified: Secondary | ICD-10-CM | POA: Diagnosis not present

## 2021-02-21 DIAGNOSIS — M19011 Primary osteoarthritis, right shoulder: Secondary | ICD-10-CM | POA: Diagnosis not present

## 2021-02-21 DIAGNOSIS — R0602 Shortness of breath: Secondary | ICD-10-CM | POA: Diagnosis not present

## 2021-02-21 DIAGNOSIS — I517 Cardiomegaly: Secondary | ICD-10-CM | POA: Diagnosis not present

## 2021-02-21 IMAGING — DX DG CHEST 2V
2 series · 2 of 2 positions shown · non-contrast
Comparison: No recent comparison imaging. Prior clavicular x-ray
from [IZ].

CLINICAL DATA: Cough shortness of breath for 1 month in a
75-year-old female. Shortness of breath with exertion.

EXAM:
CHEST - 2 VIEW

[chest pa]
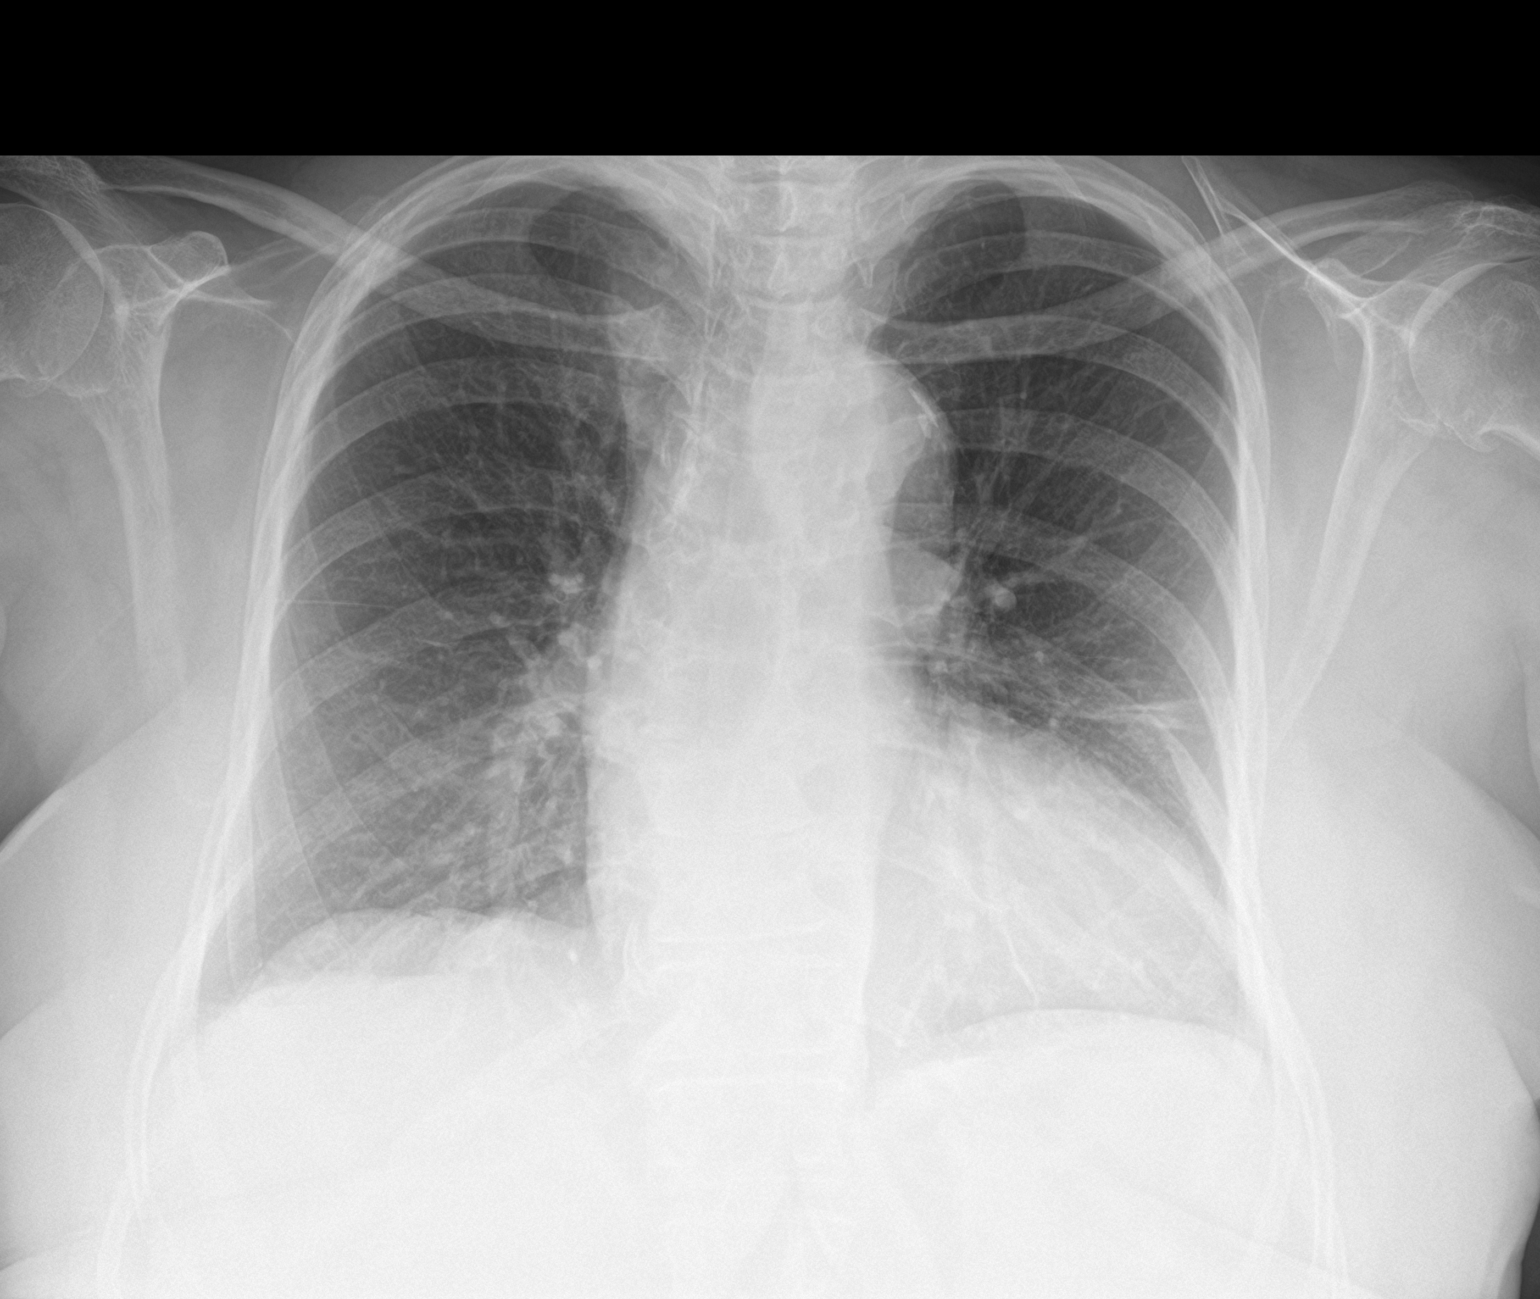

[chest lat]
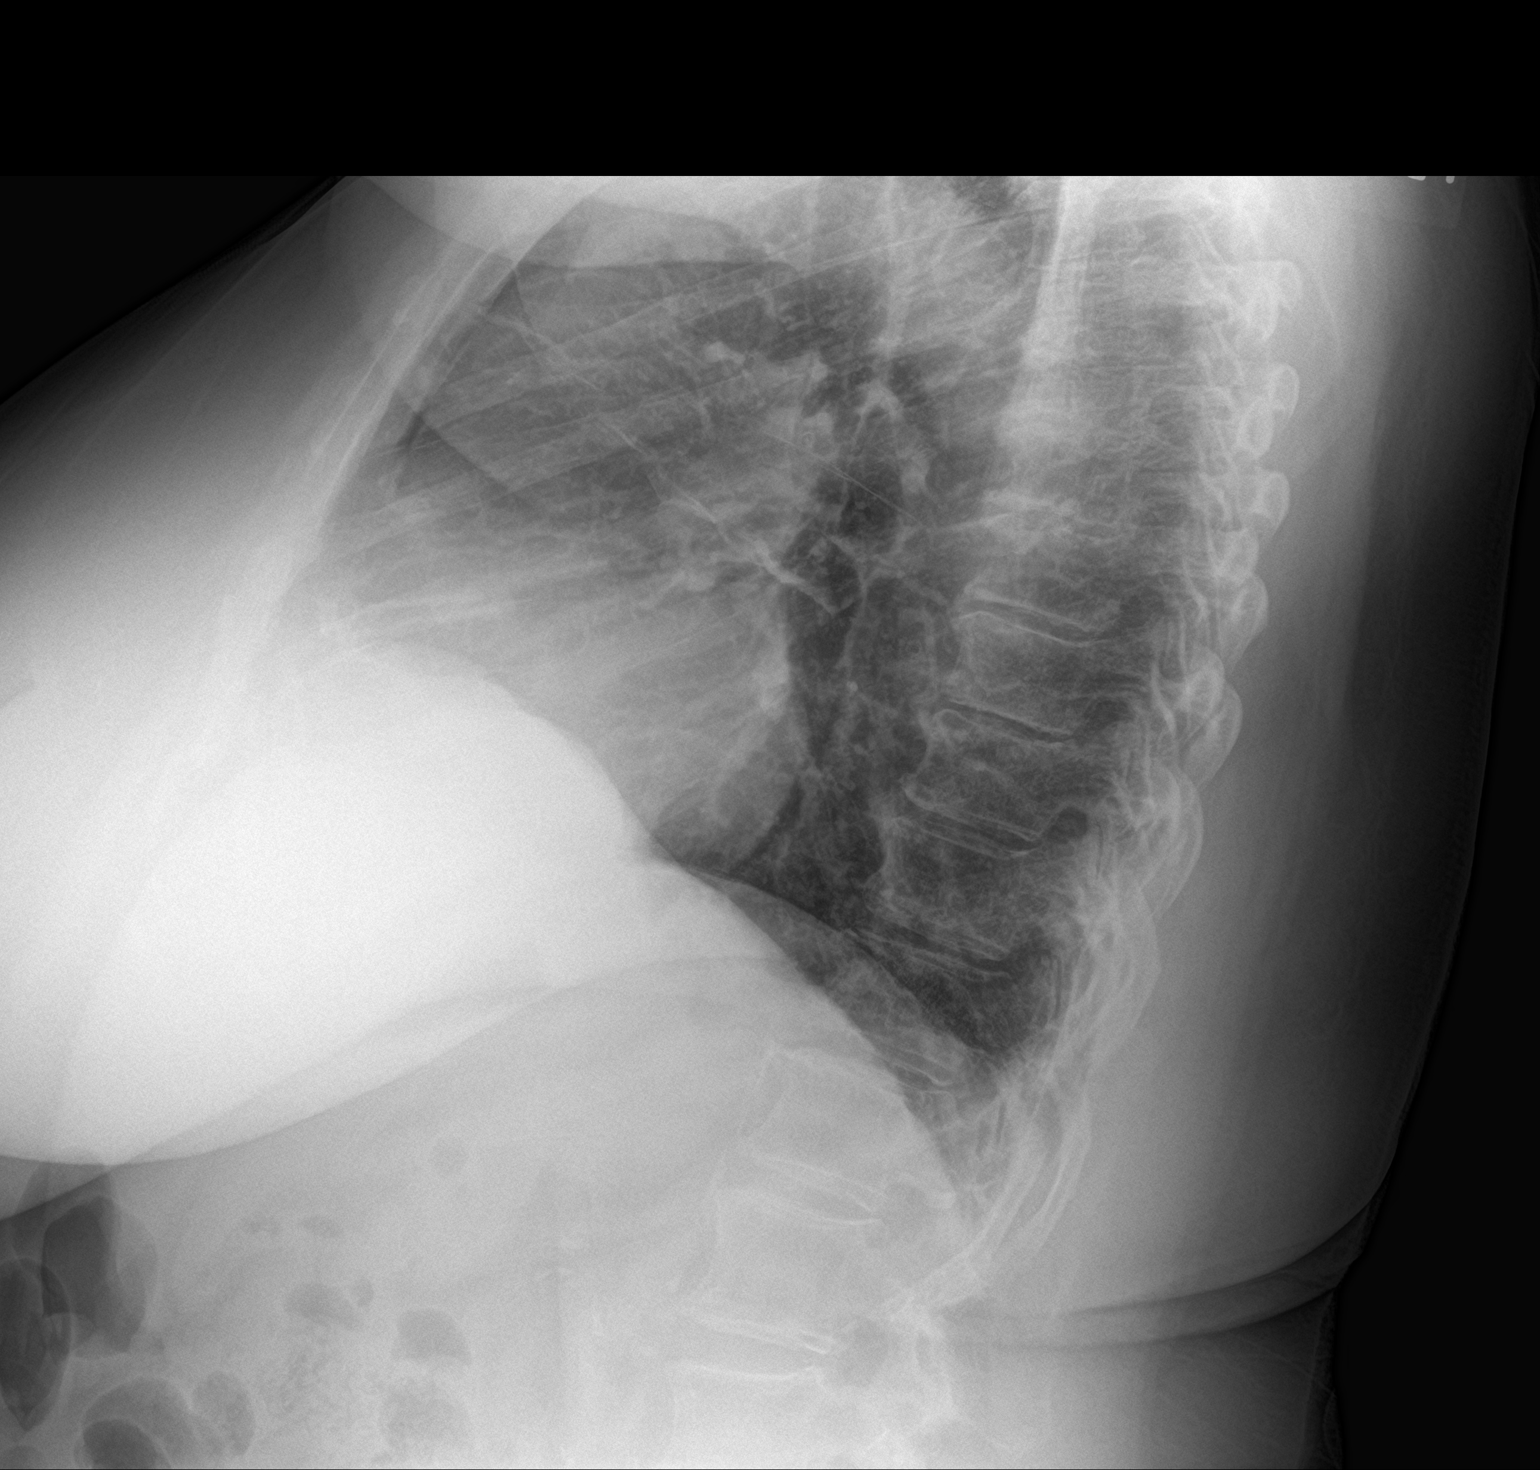

[2 of 2 positions shown; findings below may reference images not displayed]

FINDINGS: Trachea is midline accounting for mild rotation on the current
image.

Mild ectasia of the aorta is suggested though previous clavicular
images show similar appearance to the superior mediastinal contour.

Heart size is enlarged.  Hilar structures are unremarkable.

Bandlike opacity in the LEFT mid chest. No lobar consolidative
process. No sign of pleural effusion.

On limited assessment no acute skeletal process.
IMPRESSION: Bandlike opacity in the LEFT mid chest, likely atelectasis or
scarring.

Cardiomegaly.

No evidence of acute cardiopulmonary disease.

## 2021-02-21 IMAGING — DX DG CLAVICLE*R*
2 series · 2 of 2 positions shown · non-contrast
Comparison: [DATE]

CLINICAL DATA: Right sternoclavicular joint pain

EXAM:
RIGHT CLAVICLE - 2+ VIEWS

[clavicle ap]
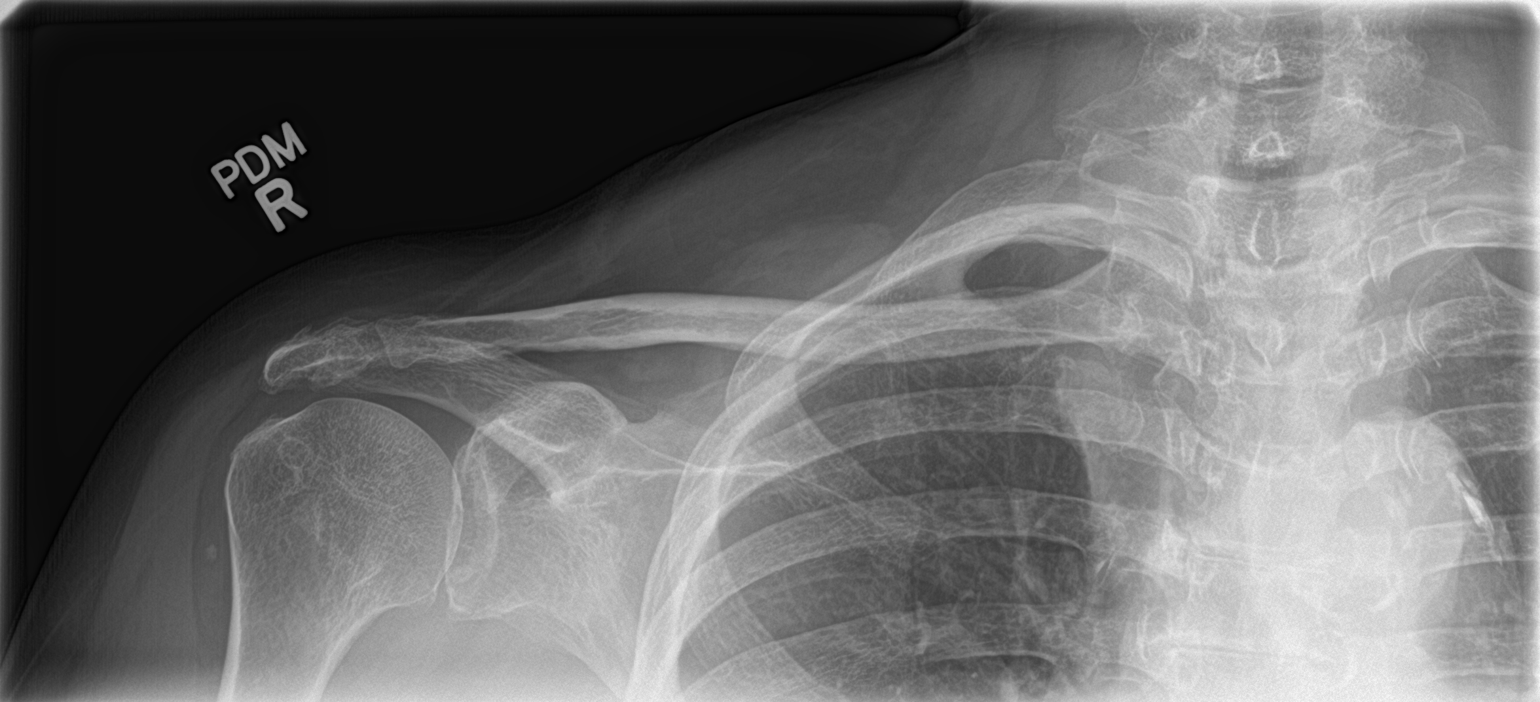

[clavicle axial]
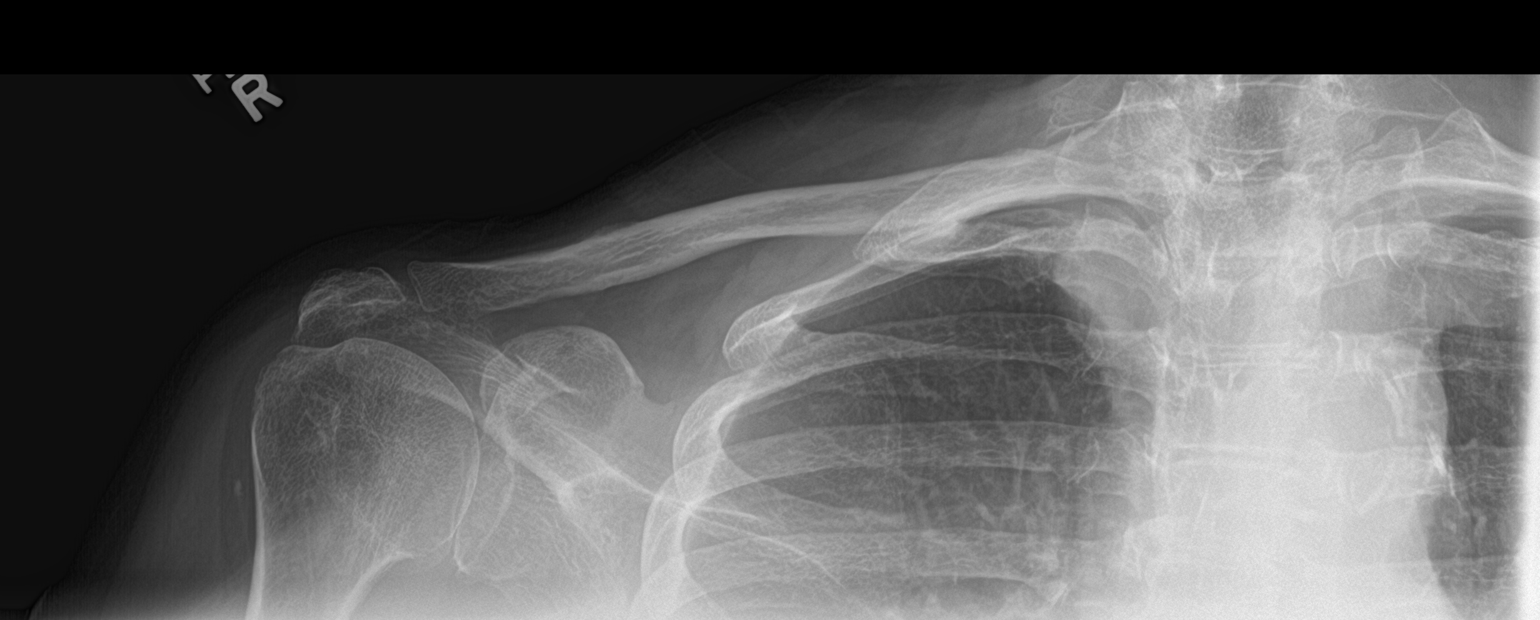

[2 of 2 positions shown; findings below may reference images not displayed]

FINDINGS: Similar degree of right AC joint degenerative change with bony
spurring. Clavicle intact. Right shoulder alignment is anatomic.
Medial aspect of the clavicle appears relatively symmetric. Included
upper chest demonstrates aortic atherosclerosis.
IMPRESSION: Degenerative changes as above. No acute finding by plain
radiography.

## 2021-02-21 NOTE — Progress Notes (Signed)
Clavicle x-ray where the bump is shows arthritis in the joint probable because of the bump.

## 2021-02-21 NOTE — Progress Notes (Signed)
Chest x-ray shows mild enlargement of heart shadow.  And some possible scarring in the lung on the left.   Plan proceed with cardiology consult.  Let us know if you have not heard about a cardiology appointment in the near future.

## 2021-02-28 ENCOUNTER — Other Ambulatory Visit: Payer: Self-pay

## 2021-03-01 MED ORDER — ALLOPURINOL 100 MG PO TABS: 200.0000 mg | ORAL_TABLET | Freq: Every day | ORAL | 0 refills | Status: DC

## 2021-03-07 ENCOUNTER — Telehealth: Payer: Self-pay | Admitting: Internal Medicine

## 2021-03-07 MED ORDER — ATORVASTATIN CALCIUM 10 MG PO TABS: ORAL_TABLET | ORAL | 2 refills | Status: DC

## 2021-03-07 NOTE — Telephone Encounter (Signed)
Patient is returning a phone call back to [facility].  Please advise.

## 2021-03-07 NOTE — Telephone Encounter (Signed)
Please seen result note

## 2021-03-07 NOTE — Addendum Note (Signed)
Addended by: Nilda Riggs on: 03/07/2021 10:07 AM   Modules accepted: Orders

## 2021-03-07 NOTE — Addendum Note (Signed)
Addended by: Nilda Riggs on: 03/07/2021 09:43 AM   Modules accepted: Orders

## 2021-03-08 DIAGNOSIS — R0609 Other forms of dyspnea: Secondary | ICD-10-CM | POA: Insufficient documentation

## 2021-03-08 DIAGNOSIS — R6889 Other general symptoms and signs: Secondary | ICD-10-CM | POA: Insufficient documentation

## 2021-03-08 DIAGNOSIS — R0600 Dyspnea, unspecified: Secondary | ICD-10-CM | POA: Insufficient documentation

## 2021-03-08 NOTE — Progress Notes (Signed)
Rescheduled

## 2021-03-09 ENCOUNTER — Ambulatory Visit: Payer: PRIVATE HEALTH INSURANCE | Admitting: Cardiology

## 2021-03-09 DIAGNOSIS — R0600 Dyspnea, unspecified: Secondary | ICD-10-CM

## 2021-03-09 DIAGNOSIS — R6889 Other general symptoms and signs: Secondary | ICD-10-CM

## 2021-03-16 ENCOUNTER — Encounter: Payer: Self-pay | Admitting: Cardiology

## 2021-03-16 ENCOUNTER — Ambulatory Visit: Payer: Medicare Other | Admitting: Cardiology

## 2021-03-16 ENCOUNTER — Other Ambulatory Visit: Payer: Self-pay

## 2021-03-16 VITALS — BP 140/90 | HR 83 | Temp 98.5°F | Resp 16 | Ht 64.0 in | Wt 214.0 lb

## 2021-03-16 DIAGNOSIS — E782 Mixed hyperlipidemia: Secondary | ICD-10-CM

## 2021-03-16 DIAGNOSIS — R0609 Other forms of dyspnea: Secondary | ICD-10-CM | POA: Diagnosis not present

## 2021-03-16 DIAGNOSIS — M79605 Pain in left leg: Secondary | ICD-10-CM | POA: Insufficient documentation

## 2021-03-16 DIAGNOSIS — R0600 Dyspnea, unspecified: Secondary | ICD-10-CM

## 2021-03-16 MED ORDER — ROSUVASTATIN CALCIUM 20 MG PO TABS: 20.0000 mg | ORAL_TABLET | Freq: Every day | ORAL | 3 refills | Status: DC

## 2021-03-16 NOTE — Progress Notes (Signed)
Patient referred by Burnis Medin, MD for dyspnea on exertion, hyperlipidemia  Subjective:   Christy Young, female    DOB: 07-May-1946, 75 y.o.   MRN: [id number]   Chief Complaint  Patient presents with   Exertional dyspnea   Hypertension   Palpitations   New Patient (Initial Visit)     HPI  75 y.o. African-American female with primary hyperparathyroidism, obesity, arthritis, untreated hyperlipidemia, dyspnea on exertion.  Patient is retired, lives fairly sedentary lifestyle.  Her activity has been limited due to multiple complaints appointments for about a year or so.  Recently, she will experience dyspnea on exertion with minimal activity.  She denies any orthopnea, PND, regular symptoms.  Also denies any chest pain.  She has had hyperlipidemia for several years.  In the past, she was on Lipitor, which was stopped due to myalgias.  She was then started on pravastatin, but patient admits to not taking it regularly.  Currently, she is not on lipid-lowering therapy.  On a separate note, patient complains of pain in the left leg for several years.  She is very concerned about a clot in this leg.   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     Past Surgical History:  Procedure Laterality Date   ABDOMINAL HYSTERECTOMY     BREAST BIOPSY     right side   FACIAL COSMETIC SURGERY  1980     Social History   Tobacco Use  Smoking Status Never  Smokeless Tobacco Never    Social History   Substance and Sexual Activity  Alcohol Use Yes   Comment: qo pm may drink a glass of wine      Family  History  Problem Relation Age of Onset   Hypertension Mother    Arthritis Mother    Alcohol abuse Father        deceased   Diabetes Father    Lymphoma Sister        non hodgkins   Heart attack Brother    Lung cancer Brother    Lung cancer Brother    Lung cancer Brother    Heart attack Son 60       1999   Hyperparathyroidism Neg Hx      Current Outpatient Medications on File Prior to Visit  Medication Sig Dispense Refill   allopurinol (ZYLOPRIM) 100 MG tablet Take 2 tablets (200 mg total) by mouth daily. 180 tablet 0   Fluocinolone Acetonide Body 0.01 % OIL Apply topically.     nebivolol (BYSTOLIC) 5 MG tablet TAKE 1 TABLET DAILY 90 tablet 0   Omega-3 Fatty Acids (FISH OIL) 1000 MG CPDR Take by mouth.     Vitamin D, Ergocalciferol, (DRISDOL) 1.25 MG (50000 UNIT) CAPS capsule TAKE 1 CAPSULE BY MOUTH EVERY 7 DAYS. 12 capsule 0   No current facility-administered medications on file prior to visit.    Cardiovascular and other pertinent studies:  EKG 03/16/2021: Sinus rhythm 83 bpm Left atrial enlargement.  Poor  R-wave progression -nonspecific   Recent labs: 02/16/2021: Glucose 89, BUN/Cr 16/0.79. EGFR 73. Na/K 143/3.9. Rest of the CMP normal H/H 13/40. MCV 90. Platelets 285 HbA1C 5.9% Chol 289, TG 326, HDL 57, LDL 193 TSH 0.34 low, free T4 0.61 normal    Review of Systems  Cardiovascular:  Positive for dyspnea on exertion. Negative for chest pain, leg swelling, palpitations and syncope.  Musculoskeletal:        Left calf pain         Vitals:   03/16/21 0945 03/16/21 0953  BP: (!) 141/89 140/90  Pulse: 80 83  Resp: 16   Temp: 98.5 F (36.9 C)   SpO2: 100% 99%     Body mass index is 36.73 kg/m. Filed Weights   03/16/21 0945  Weight: 214 lb (97.1 kg)     Objective:   Physical Exam Vitals and nursing note reviewed.  Constitutional:      General: She is not in acute distress.    Appearance: She is obese.  Neck:     Vascular: No JVD.   Cardiovascular:     Rate and Rhythm: Normal rate and regular rhythm.     Pulses: Normal pulses.     Heart sounds: Normal heart sounds. No murmur heard. Pulmonary:     Effort: Pulmonary effort is normal.     Breath sounds: Normal breath sounds. No wheezing or rales.  Musculoskeletal:     Right lower leg: No edema.     Left lower leg: No edema.         Assessment & Recommendations:   75 y.o. African-American female with primary hyperparathyroidism, obesity, arthritis, untreated hyperlipidemia, dyspnea on exertion.  Dyspnea on exertion: Most likely related to obesity and deconditioning. Will obtain echocardiogram  Mixed hyperlipidemia:  Very high. Lipitor caused myalgias. Will try Crestor 20 mg daily.  Leg pain: Low suspicion for SVT. Patient is very concerned. Will check DVT US study.    Thank you for referring the patient to [place]. Please feel free to contact with any questions.   Nigel Mormon, MD Pager: [phone] Office: [phone]

## 2021-03-18 ENCOUNTER — Ambulatory Visit (INDEPENDENT_AMBULATORY_CARE_PROVIDER_SITE_OTHER): Payer: Medicare Other | Admitting: Endocrinology

## 2021-03-18 ENCOUNTER — Other Ambulatory Visit: Payer: Self-pay

## 2021-03-18 ENCOUNTER — Other Ambulatory Visit (INDEPENDENT_AMBULATORY_CARE_PROVIDER_SITE_OTHER): Payer: Medicare Other

## 2021-03-18 VITALS — BP 140/80 | HR 75 | Ht 64.0 in | Wt 213.6 lb

## 2021-03-18 DIAGNOSIS — E213 Hyperparathyroidism, unspecified: Secondary | ICD-10-CM

## 2021-03-18 LAB — VITAMIN D 25 HYDROXY (VIT D DEFICIENCY, FRACTURES): VITD: 32.97 ng/mL (ref 30.00–100.00)

## 2021-03-18 NOTE — Patient Instructions (Addendum)
Blood tests are requested for you today.  We'll let you know about the results.   So far, the parathyroid does not need surgery.   Please come back for a follow-up appointment in 4-6 months.

## 2021-03-18 NOTE — Progress Notes (Signed)
Subjective:    Patient ID: Christy Young, female    DOB: 1946-02-11, 75 y.o.   MRN: [id number]  HPI Pt returns for f/u of primary hyperparathyroidism (dx'ed 2018; DEXA in 2021 was normal; she has never had urolithiasis; she takes vit-D, 5000 units/d).  Main symptoms are fatigue and back pain.  Past Medical History:  Diagnosis Date   ARTHRITIS    Carpal tunnel syndrome 08/23/2015   Left   Fatigue    Gout, unspecified    Hyperlipidemia    Hypertension    meds since age 51    Hyperuricemia    Nonspecific abnormal electrocardiogram (ECG) (EKG)    t wave non acute     OBESITY    Palpitations    hospitalized 2005 felt from stress neg cards eval   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    Past Surgical History:  Procedure Laterality Date   ABDOMINAL HYSTERECTOMY     BREAST BIOPSY     right side   FACIAL COSMETIC SURGERY  1980    Social History   Socioeconomic History   Marital status: Single    Spouse name: Not on file   Number of children: Not on file   Years of education: Not on file   Highest education level: Not on file  Occupational History   Occupation: retired   Tobacco Use   Smoking status: Never   Smokeless tobacco: Never  Vaping Use   Vaping Use: Never used  Substance and Sexual Activity   Alcohol use: Yes    Comment: qo pm may drink a glass of wine    Drug use: No   Sexual activity: Not on file  Other Topics Concern   Not on file  Social History Narrative   Occupation: retired Event organiser, 3 yrs of college   Bereaved parent   [place] in 12-18-2022    No pets   G7P2   Sis died of cancer lymphoma 28  and bro now with lung cancer and spread.died June 15, 2023.   Social Determinants of Health   Financial Resource Strain: Low Risk    Difficulty of Paying Living Expenses: Not hard at all  Food Insecurity: No Food Insecurity   Worried About Charity fundraiser in the Last Year: Never true   [place] in the Last Year: Never  true  Transportation Needs: No Transportation Needs   Lack of Transportation (Medical): No   Lack of Transportation (Non-Medical): No  Physical Activity: Insufficiently Active   Days of Exercise per Week: 5 days   Minutes of Exercise per Session: 20 min  Stress: No Stress Concern Present   Feeling of Stress : Not at all  Social Connections: Moderately Isolated   Frequency of Communication with Friends and Family: More than three times a week   Frequency of Social Gatherings with Friends and Family: More than three times a week   Attends Religious Services: Never   Marine scientist or Organizations: Yes   Attends Music therapist: 1 to 4 times per year   Marital Status: Never married  Human resources officer Violence: Not At Risk   Fear of Current or Ex-Partner: No   Emotionally Abused: No   Physically Abused: No   Sexually Abused: No    Current Outpatient Medications on File Prior to Visit  Medication Sig Dispense Refill   allopurinol (ZYLOPRIM) 100 MG tablet Take 2 tablets (200 mg  total) by mouth daily. 180 tablet 0   Fluocinolone Acetonide Body 0.01 % OIL Apply topically.     nebivolol (BYSTOLIC) 5 MG tablet TAKE 1 TABLET DAILY 90 tablet 0   Omega-3 Fatty Acids (FISH OIL) 1000 MG CPDR Take by mouth.     rosuvastatin (CRESTOR) 20 MG tablet Take 1 tablet (20 mg total) by mouth daily. 30 tablet 3   Vitamin D, Ergocalciferol, (DRISDOL) 1.25 MG (50000 UNIT) CAPS capsule TAKE 1 CAPSULE BY MOUTH EVERY 7 DAYS. 12 capsule 0   No current facility-administered medications on file prior to visit.    Allergies  Allergen Reactions   Pravastatin Other (See Comments)    Body MS aches and pain s   Rosuvastatin     REACTION: leg cramps.   Penicillins Other (See Comments)    Patient  says she is not allergic to amoxicillin or augmentin   Has taken these wo problem ? Of SE when she was 14  Not sever     Family History  Problem Relation Age of Onset   Hypertension Mother     Arthritis Mother    Alcohol abuse Father        deceased   Diabetes Father    Lymphoma Sister        non hodgkins   Heart attack Brother    Lung cancer Brother    Lung cancer Brother    Lung cancer Brother    Heart attack Son 60       1999   Hyperparathyroidism Neg Hx     BP 140/80 (BP Location: Right Arm, Patient Position: Sitting, Cuff Size: Large)   Pulse 75   Ht 5\' 4"  (1.626 m)   Wt 213 lb 9.6 oz (96.9 kg)   SpO2 96%   BMI 36.66 kg/m    Review of Systems Denies falls.      Objective:   Physical Exam VITAL SIGNS:  See vs page GENERAL: no distress SPINE: no kyphosis  25-OH Vit-D=33  Lab Results  Component Value Date   CREATININE 0.79 02/16/2021   BUN 16 02/16/2021   NA 143 02/16/2021   K 3.9 02/16/2021   CL 106 02/16/2021   CO2 26 02/16/2021      Assessment & Plan:  Vit-D def: well-controlled.  Please continue the same Vit-D.  Hyperparathyroidism: recheck today.   Patient Instructions  Blood tests are requested for you today.  We'll let you know about the results.   So far, the parathyroid does not need surgery.   Please come back for a follow-up appointment in 4-6 months.

## 2021-03-20 ENCOUNTER — Other Ambulatory Visit: Payer: Self-pay

## 2021-03-21 LAB — PTH, INTACT AND CALCIUM
Calcium: 10.9 mg/dL — ABNORMAL HIGH (ref 8.6–10.4)
PTH: 241 pg/mL — ABNORMAL HIGH (ref 16–77)

## 2021-03-24 DIAGNOSIS — Z23 Encounter for immunization: Secondary | ICD-10-CM | POA: Diagnosis not present

## 2021-03-25 MED ORDER — VITAMIN D (ERGOCALCIFEROL) 1.25 MG (50000 UNIT) PO CAPS: 50000.0000 [IU] | ORAL_CAPSULE | ORAL | 0 refills | Status: DC

## 2021-04-01 ENCOUNTER — Ambulatory Visit: Payer: Medicare Other

## 2021-04-01 ENCOUNTER — Other Ambulatory Visit: Payer: Self-pay

## 2021-04-01 DIAGNOSIS — M79605 Pain in left leg: Secondary | ICD-10-CM

## 2021-04-01 DIAGNOSIS — R0609 Other forms of dyspnea: Secondary | ICD-10-CM

## 2021-04-03 ENCOUNTER — Other Ambulatory Visit: Payer: Self-pay | Admitting: Internal Medicine

## 2021-04-22 ENCOUNTER — Telehealth: Payer: Self-pay | Admitting: Internal Medicine

## 2021-04-22 NOTE — Telephone Encounter (Signed)
Patient called to follow up on needing a referral for her appointment at [facility] on 11/8. Patient states something should have been sent over from their office and she was just following up on it    Good callback number is [phone]    Please advise

## 2021-04-25 ENCOUNTER — Ambulatory Visit: Payer: PRIVATE HEALTH INSURANCE | Admitting: Cardiology

## 2021-04-25 NOTE — Progress Notes (Signed)
Rescheduled

## 2021-04-27 ENCOUNTER — Ambulatory Visit (INDEPENDENT_AMBULATORY_CARE_PROVIDER_SITE_OTHER): Payer: Medicare Other

## 2021-04-27 ENCOUNTER — Other Ambulatory Visit: Payer: Self-pay

## 2021-04-27 DIAGNOSIS — Z0000 Encounter for general adult medical examination without abnormal findings: Secondary | ICD-10-CM

## 2021-04-27 NOTE — Progress Notes (Addendum)
Virtual Visit via Telephone Note  I connected with  Christy Young on 04/27/21 at 11:00 AM EDT by telephone and verified that I am speaking with the correct person using two identifiers.  Medicare Annual Wellness visit completed telephonically due to Covid-19 pandemic.   Persons participating in this call: This Health Coach and this patient.   Location: Patient: Home Provider: Office   I discussed the limitations, risks, security and privacy concerns of performing an evaluation and management service by telephone and the availability of in person appointments. The patient expressed understanding and agreed to proceed.  Unable to perform video visit due to video visit attempted and failed and/or patient does not have video capability.   Some vital signs may be absent or patient reported.   Willette Brace, LPN   Subjective:   Christy Young is a 75 y.o. female who presents for Medicare Annual (Subsequent) preventive examination.  Review of Systems     Cardiac Risk Factors include: advanced age (>2men, >9 women);hypertension;dyslipidemia;obesity (BMI >30kg/m2)     Objective:    Today's Vitals   04/27/21 1110  PainSc: 10-Worst pain ever   There is no height or weight on file to calculate BMI.  Advanced Directives 04/27/2021 04/21/2020 08/01/2016  Does Patient Have a Medical Advance Directive? Yes Yes No  Does patient want to make changes to medical advance directive? Yes (MAU/Ambulatory/Procedural Areas - Information given) Yes (MAU/Ambulatory/Procedural Areas - Information given) -    Current Medications (verified) Outpatient Encounter Medications as of 04/27/2021  Medication Sig   allopurinol (ZYLOPRIM) 100 MG tablet Take 2 tablets (200 mg total) by mouth daily.   nebivolol (BYSTOLIC) 5 MG tablet TAKE 1 TABLET DAILY   Omega-3 Fatty Acids (FISH OIL) 1000 MG CPDR Take by mouth.   rosuvastatin (CRESTOR) 20 MG tablet Take 1 tablet (20 mg total) by mouth daily.   Vitamin D,  Ergocalciferol, (DRISDOL) 1.25 MG (50000 UNIT) CAPS capsule Take 1 capsule (50,000 Units total) by mouth every 7 (seven) days.   [DISCONTINUED] Fluocinolone Acetonide Body 0.01 % OIL Apply topically. (Patient not taking: Reported on 04/27/2021)   No facility-administered encounter medications on file as of 04/27/2021.    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   Past Surgical History:  Procedure Laterality Date   ABDOMINAL HYSTERECTOMY     BREAST BIOPSY     right side   FACIAL COSMETIC SURGERY  1980   Family History  Problem Relation Age of Onset   Hypertension Mother    Arthritis Mother    Alcohol abuse Father        deceased   Diabetes Father    Lymphoma Sister        non hodgkins   Heart attack Brother    Lung cancer Brother    Lung cancer Brother    Lung cancer Brother    Heart attack Son 34       1999  Hyperparathyroidism Neg Hx    Social History   Socioeconomic History   Marital status: Single    Spouse name: Not on file   Number of children: Not on file   Years of education: Not on file   Highest education level: Not on file  Occupational History   Occupation: retired   Tobacco Use   Smoking status: Never   Smokeless tobacco: Never  Vaping Use   Vaping Use: Never used  Substance and Sexual Activity   Alcohol use: Yes    Comment: qo pm may drink a glass of wine    Drug use: No   Sexual activity: Not on file  Other Topics Concern   Not on file  Social History Narrative   Occupation: retired Research scientist (life sciences), 3 yrs of college   Bereaved parent   [place] in 12/23/2022    No pets   G3P2   Sis died of cancer lymphoma 24  and bro now with lung cancer and spread.died 2023/06/20.   Social Determinants of Health   Financial Resource Strain: Low Risk    Difficulty of Paying Living Expenses: Not hard at all  Food Insecurity: No Food Insecurity   Worried About Charity fundraiser in the Last Year: Never true   [place] in the Last Year: Never true  Transportation Needs: No Transportation Needs   Lack of Transportation (Medical): No   Lack of Transportation (Non-Medical): No  Physical Activity: Insufficiently Active   Days of Exercise per Week: 3 days   Minutes of Exercise per Session: 20 min  Stress: No Stress Concern Present   Feeling of Stress : Not at all  Social Connections: Moderately Integrated   Frequency of Communication with Friends and Family: More than three times a week   Frequency of Social Gatherings with Friends and Family: More than three times a week   Attends Religious Services: More than 4 times per year   Active Member of [employer] or Organizations: Yes   Attends Archivist Meetings: 1 to 4 times per year   Marital Status: Never married    Tobacco Counseling Counseling given: Not Answered   Clinical Intake:  Pre-visit preparation completed: Yes  Pain : 0-10 Pain Score: 10-Worst pain ever Pain Type: Chronic pain Pain Location: Back Pain Descriptors / Indicators: Sharp, Shooting, Other (Comment) (pearcing) Pain Onset: More than a month ago Pain Frequency: Constant     BMI - recorded: 36.66 Nutritional Status: BMI > 30  Obese Diabetes: No  How often do you need to have someone help you when you read instructions, pamphlets, or other written materials from your doctor or pharmacy?: 1 - Never  Diabetic?No  Interpreter Needed?: No  Information entered by :: Charlott Rakes, LPN   Activities of Daily Living In your present state of  health, do you have any difficulty performing the following activities: 04/27/2021  Hearing? N  Vision? N  Difficulty concentrating or making decisions? N  Walking or climbing stairs? N  Dressing or bathing? N  Doing errands, shopping? N  Preparing Food and eating ? N  Using the Toilet? N  In the past six months, have you accidently leaked urine? N  Do you have problems with loss of bowel control? N  Managing your Medications? N  Managing your Finances? N  Housekeeping or managing your Housekeeping? N  Some recent data might be hidden    Patient Care Team: Burnis Medin,  MD as PCP - General (Internal Medicine) Valinda Party, MD as Referring Physician (Rheumatology)  Indicate any recent Medical Services you may have received from other than [facility] providers in the past year (date may be approximate).     Assessment:   This is a routine wellness examination for [facility].  Hearing/Vision screen Hearing Screening - Comments:: Pt denies any hearing issues  Vision Screening - Comments:: Pt follows up with provider for Dr Idolina Primer annual eye  exams   Dietary issues and exercise activities discussed: Current Exercise Habits: Home exercise routine, Type of exercise: walking, Time (Minutes): 20, Frequency (Times/Week): 3, Weekly Exercise (Minutes/Week): 60   Goals Addressed             This Visit's Progress    Patient Stated       Get rid of this pain in my back       Depression Screen PHQ 2/9 Scores 04/27/2021 04/21/2020 04/05/2020 02/18/2020 02/03/2019 10/16/2018 09/06/2016  PHQ - 2 Score 1 0 0 0 0 1 1  PHQ- 9 Score - - - 0 - 3 -    Fall Risk Fall Risk  04/27/2021 04/21/2020 04/05/2020 02/18/2020 02/03/2019  Falls in the past year? 0 0 0 0 0  Number falls in past yr: 0 0 0 - 0  Injury with Fall? 0 0 0 - 0  Risk for fall due to : Impaired vision;Impaired mobility Impaired vision - - -  Follow up Falls prevention discussed Falls prevention discussed Falls evaluation completed - -     FALL RISK PREVENTION PERTAINING TO THE HOME:  Any stairs in or around the home? Yes  If so, are there any without handrails? No  Home free of loose throw rugs in walkways, pet beds, electrical cords, etc? Yes  Adequate lighting in your home to reduce risk of falls? Yes   ASSISTIVE DEVICES UTILIZED TO PREVENT FALLS:  Life alert? No  Use of a cane, walker or w/c? Yes at times  Grab bars in the bathroom? No  Shower chair or bench in shower? No  Elevated toilet seat or a handicapped toilet? No   TIMED UP AND GO:  Was the test performed? No .   Cognitive Function:     6CIT Screen 04/27/2021 04/21/2020 08/01/2016  What Year? 0 points 0 points 0 points  What month? 0 points 0 points 0 points  What time? 0 points - 0 points  Count back from 20 0 points 0 points 0 points  Months in reverse 0 points 0 points 0 points  Repeat phrase 0 points 0 points 0 points  Total Score 0 - 0    Immunizations Immunization History  Administered Date(s) Administered   Fluad Quad(high Dose 65+) 06/24/2019, 04/05/2020   Influenza Split 04/23/2012   Influenza, High Dose Seasonal PF 05/03/2015, 04/11/2017, 03/24/2021   Influenza,inj,Quad PF,6+ Mos 04/11/2013   PFIZER(Purple Top)SARS-COV-2 Vaccination 08/21/2019, 09/18/2019, 04/13/2020, 10/05/2020   Pfizer Covid-19 Vaccine Bivalent Booster 37yrs & up 03/24/2021   Pneumococcal Conjugate-13 08/04/2015   Pneumococcal Polysaccharide-23 12/26/2010   Td 03/26/2009    TDAP status: Up to date  Flu Vaccine status: Up to date  Pneumococcal vaccine status: Up to date  Covid-19 vaccine status: Completed vaccines  Qualifies for Shingles Vaccine? Yes   Zostavax completed No   Shingrix Completed?: No.    Education has been provided regarding the importance of this vaccine. Patient has been advised to call insurance company to determine out of pocket expense if they have  not yet received this vaccine. Advised may also receive vaccine at local pharmacy or  Health Dept. Verbalized acceptance and understanding.  Screening Tests Health Maintenance  Topic Date Due   Zoster Vaccines- Shingrix (1 of 2) Never done   TETANUS/TDAP  03/27/2019   COLONOSCOPY (Pts 45-28yrs Insurance coverage will need to be confirmed)  02/14/2024   Pneumonia Vaccine 34+ Years old  Completed   INFLUENZA VACCINE  Completed   DEXA SCAN  Completed   COVID-19 Vaccine  Completed   Hepatitis C Screening  Completed   HPV VACCINES  Aged Out    Health Maintenance  Health Maintenance Due  Topic Date Due   Zoster Vaccines- Shingrix (1 of 2) Never done   TETANUS/TDAP  03/27/2019    Colorectal cancer screening: Type of screening: Colonoscopy. Completed 02/13/14. Repeat every 10 years  Mammogram status: Completed 05/24/20. Repeat every year scheduled 05/02/21  Bone Density status: Completed 06/08/20. Results reflect: Bone density results: NORMAL. Repeat every 2 years.  Additional Screening:  Hepatitis C Screening: Completed 08/04/15  Vision Screening: Recommended annual ophthalmology exams for early detection of glaucoma and other disorders of the eye. Is the patient up to date with their annual eye exam?  Yes  Who is the provider or what is the name of the office in which the patient attends annual eye exams? Dr Idolina Primer If pt is not established with a provider, would they like to be referred to a provider to establish care? No .   Dental Screening: Recommended annual dental exams for proper oral hygiene  Community Resource Referral / Chronic Care Management: CRR required this visit?  No   CCM required this visit?  No      Plan:     I have personally reviewed and noted the following in the patient's chart:   Medical and social history Use of alcohol, tobacco or illicit drugs  Current medications and supplements including opioid prescriptions.  Functional ability and status Nutritional status Physical activity Advanced directives List of other  physicians Hospitalizations, surgeries, and ER visits in previous 12 months Vitals Screenings to include cognitive, depression, and falls Referrals and appointments  In addition, I have reviewed and discussed with patient certain preventive protocols, quality metrics, and best practice recommendations. A written personalized care plan for preventive services as well as general preventive health recommendations were provided to patient.     Willette Brace, LPN   88/01/[id number]   Nurse Notes: None

## 2021-04-27 NOTE — Patient Instructions (Signed)
Ms. Benbrook , Thank you for taking time to come for your Medicare Wellness Visit. I appreciate your ongoing commitment to your health goals. Please review the following plan we discussed and let me know if I can assist you in the future.   Screening recommendations/referrals: Colonoscopy: Done 02/13/14 repeat every 10 years  Mammogram: Scheduled 05/02/21 with [facility]  Bone Density: Done 06/08/20 repeat every  Recommended yearly ophthalmology/optometry visit for glaucoma screening and checkup Recommended yearly dental visit for hygiene and checkup  Vaccinations: Influenza vaccine: done 03/24/21 Pneumococcal vaccine: Up to date Tdap vaccine: due Shingles vaccine:   Shingrix discussed. Please contact your pharmacy for coverage information.  Covid-19:completed 2/25, 3/25, 04/13/20 &10/05/20  Advanced directives: Please bring a copy of your health care power of attorney and living will to the office at your convenience.   Conditions/risks identified: get rid of back pain  Next appointment: Follow up in one year for your annual wellness visit    Preventive Care 65 Years and Older, Female Preventive care refers to lifestyle choices and visits with your health care provider that can promote health and wellness. What does preventive care include? A yearly physical exam. This is also called an annual well check. Dental exams once or twice a year. Routine eye exams. Ask your health care provider how often you should have your eyes checked. Personal lifestyle choices, including: Daily care of your teeth and gums. Regular physical activity. Eating a healthy diet. Avoiding tobacco and drug use. Limiting alcohol use. Practicing safe sex. Taking low-dose aspirin every day. Taking vitamin and mineral supplements as recommended by your health care provider. What happens during an annual well check? The services and screenings done by your health care provider during your annual well check will depend  on your age, overall health, lifestyle risk factors, and family history of disease. Counseling  Your health care provider may ask you questions about your: Alcohol use. Tobacco use. Drug use. Emotional well-being. Home and relationship well-being. Sexual activity. Eating habits. History of falls. Memory and ability to understand (cognition). Work and work Statistician. Reproductive health. Screening  You may have the following tests or measurements: Height, weight, and BMI. Blood pressure. Lipid and cholesterol levels. These may be checked every 5 years, or more frequently if you are over 21 years old. Skin check. Lung cancer screening. You may have this screening every year starting at age 69 if you have a 30-pack-year history of smoking and currently smoke or have quit within the past 15 years. Fecal occult blood test (FOBT) of the stool. You may have this test every year starting at age 80. Flexible sigmoidoscopy or colonoscopy. You may have a sigmoidoscopy every 5 years or a colonoscopy every 10 years starting at age 6. Hepatitis C blood test. Hepatitis B blood test. Sexually transmitted disease (STD) testing. Diabetes screening. This is done by checking your blood sugar (glucose) after you have not eaten for a while (fasting). You may have this done every 1-3 years. Bone density scan. This is done to screen for osteoporosis. You may have this done starting at age 18. Mammogram. This may be done every 1-2 years. Talk to your health care provider about how often you should have regular mammograms. Talk with your health care provider about your test results, treatment options, and if necessary, the need for more tests. Vaccines  Your health care provider may recommend certain vaccines, such as: Influenza vaccine. This is recommended every year. Tetanus, diphtheria, and acellular pertussis (Tdap, Td)  vaccine. You may need a Td booster every 10 years. Zoster vaccine. You may need  this after age 100. Pneumococcal 13-valent conjugate (PCV13) vaccine. One dose is recommended after age 21. Pneumococcal polysaccharide (PPSV23) vaccine. One dose is recommended after age 47. Talk to your health care provider about which screenings and vaccines you need and how often you need them. This information is not intended to replace advice given to you by your health care provider. Make sure you discuss any questions you have with your health care provider. Document Released: 07/09/2015 Document Revised: 03/01/2016 Document Reviewed: 04/13/2015 Elsevier Interactive Patient Education  2017 [place] Prevention in the Home Falls can cause injuries. They can happen to people of all ages. There are many things you can do to make your home safe and to help prevent falls. What can I do on the outside of my home? Regularly fix the edges of walkways and driveways and fix any cracks. Remove anything that might make you trip as you walk through a door, such as a raised step or threshold. Trim any bushes or trees on the path to your home. Use bright outdoor lighting. Clear any walking paths of anything that might make someone trip, such as rocks or tools. Regularly check to see if handrails are loose or broken. Make sure that both sides of any steps have handrails. Any raised decks and porches should have guardrails on the edges. Have any leaves, snow, or ice cleared regularly. Use sand or salt on walking paths during winter. Clean up any spills in your garage right away. This includes oil or grease spills. What can I do in the bathroom? Use night lights. Install grab bars by the toilet and in the tub and shower. Do not use towel bars as grab bars. Use non-skid mats or decals in the tub or shower. If you need to sit down in the shower, use a plastic, non-slip stool. Keep the floor dry. Clean up any water that spills on the floor as soon as it happens. Remove soap buildup in the tub  or shower regularly. Attach bath mats securely with double-sided non-slip rug tape. Do not have throw rugs and other things on the floor that can make you trip. What can I do in the bedroom? Use night lights. Make sure that you have a light by your bed that is easy to reach. Do not use any sheets or blankets that are too big for your bed. They should not hang down onto the floor. Have a firm chair that has side arms. You can use this for support while you get dressed. Do not have throw rugs and other things on the floor that can make you trip. What can I do in the kitchen? Clean up any spills right away. Avoid walking on wet floors. Keep items that you use a lot in easy-to-reach places. If you need to reach something above you, use a strong step stool that has a grab bar. Keep electrical cords out of the way. Do not use floor polish or wax that makes floors slippery. If you must use wax, use non-skid floor wax. Do not have throw rugs and other things on the floor that can make you trip. What can I do with my stairs? Do not leave any items on the stairs. Make sure that there are handrails on both sides of the stairs and use them. Fix handrails that are broken or loose. Make sure that handrails are as long  as the stairways. Check any carpeting to make sure that it is firmly attached to the stairs. Fix any carpet that is loose or worn. Avoid having throw rugs at the top or bottom of the stairs. If you do have throw rugs, attach them to the floor with carpet tape. Make sure that you have a light switch at the top of the stairs and the bottom of the stairs. If you do not have them, ask someone to add them for you. What else can I do to help prevent falls? Wear shoes that: Do not have high heels. Have rubber bottoms. Are comfortable and fit you well. Are closed at the toe. Do not wear sandals. If you use a stepladder: Make sure that it is fully opened. Do not climb a closed stepladder. Make  sure that both sides of the stepladder are locked into place. Ask someone to hold it for you, if possible. Clearly mark and make sure that you can see: Any grab bars or handrails. First and last steps. Where the edge of each step is. Use tools that help you move around (mobility aids) if they are needed. These include: Canes. Walkers. Scooters. Crutches. Turn on the lights when you go into a dark area. Replace any light bulbs as soon as they burn out. Set up your furniture so you have a clear path. Avoid moving your furniture around. If any of your floors are uneven, fix them. If there are any pets around you, be aware of where they are. Review your medicines with your doctor. Some medicines can make you feel dizzy. This can increase your chance of falling. Ask your doctor what other things that you can do to help prevent falls. This information is not intended to replace advice given to you by your health care provider. Make sure you discuss any questions you have with your health care provider. Document Released: 04/08/2009 Document Revised: 11/18/2015 Document Reviewed: 07/17/2014 Elsevier Interactive Patient Education  2017 [employer].

## 2021-04-28 ENCOUNTER — Telehealth: Payer: Self-pay | Admitting: Physical Medicine and Rehabilitation

## 2021-04-28 NOTE — Telephone Encounter (Signed)
Pt called and would like to come in for another injection. She said she wanted to get the same as before Right L5-S1 interlam.   CB [phone]

## 2021-04-28 NOTE — Telephone Encounter (Signed)
Pt informed to contact [facility] in regards to forms. Pt agreed and stated she will contact them in order to refax forms.

## 2021-04-28 NOTE — Telephone Encounter (Signed)
Last injection was 02/14/21. Ok to repeat if helped, same problem/side, and no new injury?

## 2021-04-29 ENCOUNTER — Ambulatory Visit: Payer: Medicare Other | Admitting: Cardiology

## 2021-04-29 ENCOUNTER — Encounter: Payer: Self-pay | Admitting: Cardiology

## 2021-04-29 ENCOUNTER — Other Ambulatory Visit: Payer: Self-pay

## 2021-04-29 VITALS — BP 158/93 | HR 86 | Temp 98.2°F | Resp 16 | Ht 64.0 in | Wt 216.0 lb

## 2021-04-29 DIAGNOSIS — E782 Mixed hyperlipidemia: Secondary | ICD-10-CM

## 2021-04-29 DIAGNOSIS — R6889 Other general symptoms and signs: Secondary | ICD-10-CM | POA: Diagnosis not present

## 2021-04-29 DIAGNOSIS — R0609 Other forms of dyspnea: Secondary | ICD-10-CM

## 2021-04-29 MED ORDER — FUROSEMIDE 20 MG PO TABS: 20.0000 mg | ORAL_TABLET | Freq: Every day | ORAL | 3 refills | Status: DC

## 2021-04-29 NOTE — Telephone Encounter (Signed)
Patient states that injection only helped for 3 days. She requested an appointment to discuss options. Scheduled for OV.

## 2021-04-29 NOTE — Progress Notes (Signed)
Patient referred by Burnis Medin, MD for dyspnea on exertion, hyperlipidemia  Subjective:   Christy Young, female    DOB: 1945/11/01, 76 y.o.   MRN: [id number]   Chief Complaint  Patient presents with   Shortness of Breath   Follow-up   Results     HPI  75 y.o. African-American female with primary hyperparathyroidism, obesity, arthritis, untreated hyperlipidemia, dyspnea on exertion.  Patient has recently had sinus congestion for last few days. Blood pressure is higher than usual. Discussed recent test results with the patient, details below.   Initial consultation HPI 02/2021: Patient is retired, lives fairly sedentary lifestyle.  Her activity has been limited due to multiple complaints appointments for about a year or so.  Recently, she will experience dyspnea on exertion with minimal activity.  She denies any orthopnea, PND, regular symptoms.  Also denies any chest pain.  She has had hyperlipidemia for several years.  In the past, she was on Lipitor, which was stopped due to myalgias.  She was then started on pravastatin, but patient admits to not taking it regularly.  Currently, she is not on lipid-lowering therapy.  On a separate note, patient complains of pain in the left leg for several years.  She is very concerned about a clot in this leg.  Current Outpatient Medications on File Prior to Visit  Medication Sig Dispense Refill   allopurinol (ZYLOPRIM) 100 MG tablet Take 2 tablets (200 mg total) by mouth daily. 180 tablet 0   nebivolol (BYSTOLIC) 5 MG tablet TAKE 1 TABLET DAILY 90 tablet 0   Omega-3 Fatty Acids (FISH OIL) 1000 MG CPDR Take by mouth.     rosuvastatin (CRESTOR) 20 MG tablet Take 1 tablet (20 mg total) by mouth daily. 30 tablet 3   Vitamin D, Ergocalciferol, (DRISDOL) 1.25 MG (50000 UNIT) CAPS capsule Take 1 capsule (50,000 Units total) by mouth every 7 (seven) days. 12 capsule 0   No current facility-administered medications on file prior to visit.     Cardiovascular and other pertinent studies:  Echocardiogram 04/01/2021:  Left ventricle cavity is normal in size. 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 Normal global wall  motion. Normal LV systolic function with EF 57%. Doppler evidence of grade  I (impaired) diastolic dysfunction, normal LAP.  Trileaflet aortic valve.  Moderate (Grade II) aortic regurgitation.  Mild to moderate mitral regurgitation.  Mild to moderate tricuspid regurgitation. Estimated pulmonary artery  systolic pressure 31 mmHg.   Lower Extremity Venous Duplex (left)  04/01/2021:  No evidence of deep vein thrombosis of the left lower extremity with  normal venous return. Right CFV not imaged.  EKG 03/16/2021: Sinus rhythm 83 bpm Left atrial enlargement.  Poor R-wave progression -nonspecific   Recent labs: 02/16/2021: Glucose 89, BUN/Cr 16/0.79. EGFR 73. Na/K 143/3.9. Rest of the CMP normal H/H 13/40. MCV 90. Platelets 285 HbA1C 5.9% Chol 289, TG 326, HDL 57, LDL 193 TSH 0.34 low, free T4 0.61 normal    Review of Systems  Cardiovascular:  Positive for dyspnea on exertion. Negative for chest pain, leg swelling, palpitations and syncope.  Musculoskeletal:        Left calf pain         Vitals:   04/29/21 1123  BP: (!) 158/93  Pulse: 86  Resp: 16  Temp: 98.2 F (36.8 C)  SpO2: 95%    Body mass index is 37.08 kg/m. Filed Weights   04/29/21 1123  Weight:  216 lb (98 kg)     Objective:   Physical Exam Vitals and nursing note reviewed.  Constitutional:      General: She is not in acute distress.    Appearance: She is obese.  Neck:     Vascular: No JVD.  Cardiovascular:     Rate and Rhythm: Normal rate and regular rhythm.     Pulses: Normal pulses.     Heart sounds: Normal heart sounds. No murmur heard. Pulmonary:     Effort: Pulmonary effort is normal.     Breath sounds: Normal breath sounds. No wheezing or rales.  Musculoskeletal:      Right lower leg: No edema.     Left lower leg: No edema.         Assessment & Recommendations:   75 y.o. African-American female with primary hyperparathyroidism, obesity, arthritis, untreated hyperlipidemia, dyspnea on exertion.  Dyspnea on exertion: Likely multifactorial, including obesity and deconditioning.  Low suspicion for ischemia at this time. Echocardiogram shows more than anticipated LVH, septum>posterior wall, possibly out of proportion to her hypertension. There is no LVOT obstruction. Will hold off cardiac MRI for now. Prescribed lasix 20 mg daily. Repeat echocardiogram in 09/2020.  Mixed hyperlipidemia:  Very high. Lipitor caused myalgias. Able to tolerate Crestor 20 mg daily. Check lipid panel in 3 months  Hypertension: Generally well controlled. Elevated today. Monitor for now. No change made today.  Leg pain: Study negative for DVT  F/u in 6 months  Thank you for referring the patient to [place]. Please feel free to contact with any questions.   Nigel Mormon, MD Pager: [phone] Office: [phone]

## 2021-05-02 DIAGNOSIS — N6321 Unspecified lump in the left breast, upper outer quadrant: Secondary | ICD-10-CM | POA: Diagnosis not present

## 2021-05-02 DIAGNOSIS — R928 Other abnormal and inconclusive findings on diagnostic imaging of breast: Secondary | ICD-10-CM | POA: Diagnosis not present

## 2021-05-02 LAB — HM MAMMOGRAPHY: HM Mammogram: ABNORMAL — AB (ref 0–4)

## 2021-05-03 ENCOUNTER — Ambulatory Visit (INDEPENDENT_AMBULATORY_CARE_PROVIDER_SITE_OTHER): Payer: Medicare Other | Admitting: Physical Medicine and Rehabilitation

## 2021-05-03 ENCOUNTER — Other Ambulatory Visit: Payer: Self-pay

## 2021-05-03 ENCOUNTER — Encounter: Payer: Self-pay | Admitting: Physical Medicine and Rehabilitation

## 2021-05-03 VITALS — BP 138/79 | HR 87

## 2021-05-03 DIAGNOSIS — M5416 Radiculopathy, lumbar region: Secondary | ICD-10-CM

## 2021-05-03 DIAGNOSIS — M25561 Pain in right knee: Secondary | ICD-10-CM | POA: Diagnosis not present

## 2021-05-03 DIAGNOSIS — M4306 Spondylolysis, lumbar region: Secondary | ICD-10-CM | POA: Diagnosis not present

## 2021-05-03 DIAGNOSIS — M47816 Spondylosis without myelopathy or radiculopathy, lumbar region: Secondary | ICD-10-CM

## 2021-05-03 DIAGNOSIS — G8929 Other chronic pain: Secondary | ICD-10-CM | POA: Diagnosis not present

## 2021-05-03 DIAGNOSIS — M4316 Spondylolisthesis, lumbar region: Secondary | ICD-10-CM | POA: Diagnosis not present

## 2021-05-03 DIAGNOSIS — M25562 Pain in left knee: Secondary | ICD-10-CM | POA: Diagnosis not present

## 2021-05-03 NOTE — Progress Notes (Signed)
Pt state lower back pain that travels to her left knee and ankle. Pt state laying down for a long period of time makes the pain worse. Pt state she uses heat, message and takes pain meds to help ease her pain. Pt has hx of inj on 02/14/21 pt state it didn't help.  Numeric Pain Rating Scale and Functional Assessment Average Pain 10 Pain Right Now 8 My pain is intermittent, sharp, burning, stabbing, tingling, and aching Pain is worse with: some activites and laying down Pain improves with: heat/ice and medication   In the last MONTH (on 0-10 scale) has pain interfered with the following?  1. General activity like being  able to carry out your everyday physical activities such as walking, climbing stairs, carrying groceries, or moving a chair?  Rating(7)  2. Relation with others like being able to carry out your usual social activities and roles such as  activities at home, at work and in your community. Rating(8)  3. Enjoyment of life such that you have  been bothered by emotional problems such as feeling anxious, depressed or irritable?  Rating(9)

## 2021-05-03 NOTE — Progress Notes (Signed)
Christy Young - 75 y.o. female MRN [id number]  Date of birth: 1945/08/06  Office Visit Note: Visit Date: 05/03/2021 PCP: Burnis Medin, MD Referred by: Burnis Medin, MD  Subjective: Chief Complaint  Patient presents with   Lower Back - Pain   Left Knee - Pain   Left Ankle - Pain   HPI: Christy Young is a 75 y.o. female who comes in today for evaluation of chronic, worsening and severe bilateral lower back pain radiating down to lateral knee and ankle.  Patient states she has been dealing with this chronic pain for several months and is causing her to have difficulty performing daily tasks and walking up the stairs to her townhome. She reports pain is exacerbated by walking and prolonged standing, describes pain as dull and aching, currently rates as 8 out of 10. Patient reports some relief of pain with rest. Patient's lumbar MRI exhibits grade 1 anterolisthesis of L5 on S1 secondary to bilateral L5 pars interarticularis defects, there is also mild bilateral facet arthropathy noted at this level.  No high-grade spinal canal stenosis noted.  Patient did attend formal physical therapy with our in-house team in 2021 for chronic lower back pain which she reports did help to alleviate some of her discomfort. Patient had left L5-S1 interlaminar epidural steroid injection on 02/14/2021 by Dr. Magnus Sinning and reports only a few days of pain relief.  Patient states that she has suffered from chronic bilateral knee pain for many years and was told in the past that she had osteoarthritis in both of her knees.  Patient has not been formally evaluated by an orthopedist for her chronic bilateral knee pain. Patient is currently using cane to assist with ambulation to prevent falls.  Patient denies focal weakness, numbness and tingling.  Patient denies recent trauma or falls.   Review of Systems  Musculoskeletal:  Positive for back pain.  Neurological:  Negative for tingling, tremors, focal weakness and  weakness.  All other systems reviewed and are negative. Otherwise per HPI.  Assessment & Plan: Visit Diagnoses:    ICD-10-CM   1. Lumbar radiculopathy  M54.16 Ambulatory referral to Physical Medicine Rehab    CANCELED: Ambulatory referral to Physical Medicine Rehab    2. 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 Facet hypertrophy of lumbar region  M47.816     4. Pars defect of lumbar spine  M43.06     5. Chronic pain of both knees  M25.561    M25.562    G89.29        Plan: Findings:  Chronic, worsening and severe bilateral lower back pain radiating down to lateral knee and ankle.  Patient continues to have excruciating and debilitating pain despite good conservative therapy such as formal physical therapy and rest.  Patient's clinical presentation and exam are consistent with a classic L5 nerve pattern.  We believe the next step is to perform a diagnostic and hopefully therapeutic left L5 transforaminal epidural steroid injection under fluoroscopic guidance.  If patient gets good relief from this epidural injection we will continue to monitor, however if her pain persists we will consider regrouping with a short course of physical therapy.  Patient did voice concerns about chronic bilateral knee pain during our visit today.  If patient continues to have issues with bilateral knee pain we are happy to place a referral to Dr. Alphonzo Severance for formal evaluation.  We feel that we can get patient in quickly  for her injection.  No red flag symptoms noted today upon exam.   Meds & Orders: No orders of the defined types were placed in this encounter.   Orders Placed This Encounter  Procedures   Ambulatory referral to Physical Medicine Rehab     Follow-up: Return in about 1 week (around 05/10/2021) for Left L5 transforaminal epidural steroid injection.   Procedures: No procedures performed      Clinical History: MRI LUMBAR SPINE WITHOUT CONTRAST   TECHNIQUE: Multiplanar,  multisequence MR imaging of the lumbar spine was performed. No intravenous contrast was administered.   COMPARISON:  None.   FINDINGS: Segmentation:  Standard.   Alignment: Grade 1 anterolisthesis of L5 on S1 secondary to bilateral L5 pars interarticularis defects.   Vertebrae:  No fracture, evidence of discitis, or bone lesion.   Conus medullaris and cauda equina: Conus extends to the T12-L1 level. Conus and cauda equina appear normal.   Paraspinal and other soft tissues: Negative.   Disc levels:   Disc spaces: Degenerative disease with disc height loss at L4-5 and L5-S1. Disc desiccation throughout the lumbar spine.   T12-L1: Minimal broad-based disc bulge. No evidence of neural foraminal stenosis. No central canal stenosis.   L1-L2: Minimal broad-based disc bulge. Mild bilateral facet arthropathy. No evidence of neural foraminal stenosis. No central canal stenosis.   L2-L3: Mild broad-based disc bulge. Mild bilateral facet arthropathy. No evidence of neural foraminal stenosis. No central canal stenosis.   L3-L4: Mild broad-based disc bulge. Severe right and moderate left facet arthropathy. No evidence of neural foraminal stenosis. No central canal stenosis.   L4-L5: Broad-based disc bulge. Mild bilateral facet arthropathy. Mild bilateral foraminal narrowing. No central canal stenosis.   L5-S1: No significant disc protrusion. Mild bilateral facet arthropathy. Severe bilateral foraminal stenosis. No central canal stenosis.   IMPRESSION: 1. At L5-S1 there is mild bilateral facet arthropathy. Severe bilateral foraminal stenosis. Grade 1 anterolisthesis of L5 on S1 secondary to bilateral L5 pars interarticularis defects. 2. Lumbar spine spondylosis as described above.     Electronically Signed   By: Kathreen Devoid   On: 07/05/2020 11:36   She reports that she has never smoked. She has never used smokeless tobacco.  Recent Labs    02/16/21 1602  HGBA1C 5.9   LABURIC 6.1    Objective:  VS:  HT:    WT:   BMI:     BP:138/79  HR:87bpm  TEMP: ( )  RESP:  Physical Exam Vitals and nursing note reviewed.  HENT:     Head: Normocephalic and atraumatic.     Right Ear: External ear normal.     Left Ear: External ear normal.     Nose: Nose normal.     Mouth/Throat:     Mouth: Mucous membranes are moist.  Eyes:     Extraocular Movements: Extraocular movements intact.  Cardiovascular:     Rate and Rhythm: Normal rate.     Pulses: Normal pulses.  Pulmonary:     Effort: Pulmonary effort is normal.  Abdominal:     General: Abdomen is flat. There is no distension.  Musculoskeletal:        General: Tenderness present.     Cervical back: Normal range of motion.     Comments: Pt is slow to rises from seated position to standing. Good lumbar range of motion. Strong distal strength without clonus, no pain upon palpation of greater trochanters. Sensation intact bilaterally. Dysesthesias noted to left L5 dermatome. Positive slump test  on the left. Ambulates with cane, gait unsteady.   Skin:    General: Skin is warm and dry.     Capillary Refill: Capillary refill takes less than 2 seconds.  Neurological:     General: No focal deficit present.     Mental Status: She is alert.  Psychiatric:        Mood and Affect: Mood normal.    Ortho Exam  Imaging: No results found.  Past Medical/Family/Surgical/Social History: Medications & Allergies reviewed per EMR, new medications updated. 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   H/O: gout 08/08/2015   Elevated blood sugar 08/08/2015   Essential hypertension 07/09/2014    Hyperlipidemia 07/09/2014   Body aches 07/09/2014   Urinary frequency 07/09/2014   Numbness and tingling in left hand 07/09/2014   Visit for preventive health examination 04/11/2013   Obesity (BMI 30-39.9) 04/11/2013   Statin intolerance 04/11/2013   At risk for coronary artery disease 05/15/2012   Family history of premature coronary heart disease 04/23/2012   Fatigue 12/29/2010   Nonspecific abnormal electrocardiogram (ECG) (EKG) 12/29/2010   Hyperuricemia 12/29/2010   Palpitations    OBESITY 01/10/2010   HYPERLIPIDEMIA 11/09/2009   Gout 11/09/2009   HYPERTENSION 11/09/2009   ARTHRITIS 11/09/2009   Past Medical History:  Diagnosis Date   ARTHRITIS    Carpal tunnel syndrome 08/23/2015   Left   Fatigue    Gout, unspecified    Hyperlipidemia    Hypertension    meds since age 31    Hyperuricemia    Nonspecific abnormal electrocardiogram (ECG) (EKG)    t wave non acute     OBESITY    Palpitations    hospitalized 2005 felt from stress neg cards eval   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   Family History  Problem Relation Age of Onset   Hypertension Mother    Arthritis Mother    Alcohol abuse Father        deceased   Diabetes Father    Lymphoma Sister        non hodgkins   Heart attack Brother    Lung cancer Brother    Lung cancer Brother    Lung cancer Brother    Heart attack Son 70       1999   Hyperparathyroidism Neg Hx    Past Surgical History:  Procedure Laterality Date   ABDOMINAL HYSTERECTOMY     BREAST BIOPSY     right side   FACIAL COSMETIC SURGERY  1980   Social History   Occupational History   Occupation: retired   Tobacco Use   Smoking status: Never   Smokeless tobacco: Never  Vaping Use   Vaping Use: Never used  Substance and Sexual Activity   Alcohol use: Yes    Comment: qo pm may drink a glass of wine    Drug use: No   Sexual activity: Not on file

## 2021-05-04 ENCOUNTER — Encounter: Payer: Self-pay | Admitting: Internal Medicine

## 2021-05-10 ENCOUNTER — Other Ambulatory Visit: Payer: Self-pay

## 2021-05-10 ENCOUNTER — Ambulatory Visit: Payer: Self-pay

## 2021-05-10 ENCOUNTER — Ambulatory Visit (INDEPENDENT_AMBULATORY_CARE_PROVIDER_SITE_OTHER): Payer: Medicare Other | Admitting: Physical Medicine and Rehabilitation

## 2021-05-10 VITALS — BP 135/87 | HR 76

## 2021-05-10 DIAGNOSIS — M5416 Radiculopathy, lumbar region: Secondary | ICD-10-CM

## 2021-05-10 MED ORDER — METHYLPREDNISOLONE ACETATE 80 MG/ML IJ SUSP
80.0000 mg | Freq: Once | INTRAMUSCULAR | Status: AC
Start: 2021-05-10 — End: 2021-05-10
  Administered 2021-05-10: 11:00:00 80 mg

## 2021-05-10 NOTE — Patient Instructions (Signed)

## 2021-05-10 NOTE — Progress Notes (Signed)
Pt state lower back pain that travels to her left knee and ankle. Pt state laying down for a long period of time makes the pain worse. Pt state she uses heat, message and takes pain meds to help ease her pain.  Numeric Pain Rating Scale and Functional Assessment Average Pain 5   In the last MONTH (on 0-10 scale) has pain interfered with the following?  1. General activity like being  able to carry out your everyday physical activities such as walking, climbing stairs, carrying groceries, or moving a chair?  Rating(10)   +Driver, -BT, -Dye Allergies.

## 2021-05-23 DIAGNOSIS — C50212 Malignant neoplasm of upper-inner quadrant of left female breast: Secondary | ICD-10-CM | POA: Diagnosis not present

## 2021-05-23 DIAGNOSIS — N6002 Solitary cyst of left breast: Secondary | ICD-10-CM | POA: Diagnosis not present

## 2021-05-23 DIAGNOSIS — C50412 Malignant neoplasm of upper-outer quadrant of left female breast: Secondary | ICD-10-CM | POA: Diagnosis not present

## 2021-05-23 DIAGNOSIS — Z170 Estrogen receptor positive status [ER+]: Secondary | ICD-10-CM | POA: Diagnosis not present

## 2021-05-23 NOTE — Procedures (Signed)
Lumbosacral Transforaminal Epidural Steroid Injection - Sub-Pedicular Approach with Fluoroscopic Guidance  Patient: Christy Young      Date of Birth: 12-Jun-1946 MRN: [id number] PCP: Burnis Medin, MD      Visit Date: 05/10/2021   Universal Protocol:    Date/Time: 05/10/2021  Consent Given By: the patient  Position: PRONE  Additional Comments: Vital signs were monitored before and after the procedure. Patient was prepped and draped in the usual sterile fashion. The correct patient, procedure, and site was verified.   Injection Procedure Details:   Procedure diagnoses: Lumbar radiculopathy [M54.16]    Meds Administered:  Meds ordered this encounter  Medications   methylPREDNISolone acetate (DEPO-MEDROL) injection 80 mg    Laterality: Left  Location/Site: L5  Needle:5.0 in., 22 ga.  Short bevel or Quincke spinal needle  Needle Placement: Transforaminal  Findings:    -Comments: Excellent flow of contrast along the nerve, nerve root and into the epidural space.  Procedure Details: After squaring off the end-plates to get a true AP view, the C-arm was positioned so that an oblique view of the foramen as noted above was visualized. The target area is just inferior to the "nose of the scotty dog" or sub pedicular. The soft tissues overlying this structure were infiltrated with 2-3 ml. of 1% Lidocaine without Epinephrine.  The spinal needle was inserted toward the target using a "trajectory" view along the fluoroscope beam.  Under AP and lateral visualization, the needle was advanced so it did not puncture dura and was located close the 6 O'Clock position of the pedical in AP tracterory. Biplanar projections were used to confirm position. Aspiration was confirmed to be negative for CSF and/or blood. A 1-2 ml. volume of Isovue-250 was injected and flow of contrast was noted at each level. Radiographs were obtained for documentation purposes.   After attaining the desired flow of  contrast documented above, a 0.5 to 1.0 ml test dose of 0.25% Marcaine was injected into each respective transforaminal space.  The patient was observed for 90 seconds post injection.  After no sensory deficits were reported, and normal lower extremity motor function was noted,   the above injectate was administered so that equal amounts of the injectate were placed at each foramen (level) into the transforaminal epidural space.   Additional Comments:  The patient tolerated the procedure well Dressing: 2 x 2 sterile gauze and Band-Aid    Post-procedure details: Patient was observed during the procedure. Post-procedure instructions were reviewed.  Patient left the clinic in stable condition.

## 2021-05-23 NOTE — Progress Notes (Signed)
ERABELLA KUIPERS - 75 y.o. female MRN [id number]  Date of birth: 1945-07-03  Office Visit Note: Visit Date: 05/10/2021 PCP: Burnis Medin, MD Referred by: Burnis Medin, MD  Subjective: Chief Complaint  Patient presents with   Lower Back - Pain   Left Knee - Pain   HPI:  Christy Young is a 75 y.o. female who comes in today at the request of Barnet Pall, FNP for planned Left L5-S1 Lumbar Transforaminal epidural steroid injection with fluoroscopic guidance.  The patient has failed conservative care including home exercise, medications, time and activity modification.  This injection will be diagnostic and hopefully therapeutic.  Please see requesting physician notes for further details and justification.  ROS Otherwise per HPI.  Assessment & Plan: Visit Diagnoses:    ICD-10-CM   1. Lumbar radiculopathy  M54.16 XR C-ARM NO REPORT    Epidural Steroid injection    methylPREDNISolone acetate (DEPO-MEDROL) injection 80 mg      Plan: No additional findings.   Meds & Orders:  Meds ordered this encounter  Medications   methylPREDNISolone acetate (DEPO-MEDROL) injection 80 mg    Orders Placed This Encounter  Procedures   XR C-ARM NO REPORT   Epidural Steroid injection    Follow-up: Return if symptoms worsen or fail to improve.   Procedures: No procedures performed  Lumbosacral Transforaminal Epidural Steroid Injection - Sub-Pedicular Approach with Fluoroscopic Guidance  Patient: Christy Young      Date of Birth: 1946-04-25 MRN: [id number] PCP: Burnis Medin, MD      Visit Date: 05/10/2021   Universal Protocol:    Date/Time: 05/10/2021  Consent Given By: the patient  Position: PRONE  Additional Comments: Vital signs were monitored before and after the procedure. Patient was prepped and draped in the usual sterile fashion. The correct patient, procedure, and site was verified.   Injection Procedure Details:   Procedure diagnoses: Lumbar radiculopathy [M54.16]     Meds Administered:  Meds ordered this encounter  Medications   methylPREDNISolone acetate (DEPO-MEDROL) injection 80 mg    Laterality: Left  Location/Site: L5  Needle:5.0 in., 22 ga.  Short bevel or Quincke spinal needle  Needle Placement: Transforaminal  Findings:    -Comments: Excellent flow of contrast along the nerve, nerve root and into the epidural space.  Procedure Details: After squaring off the end-plates to get a true AP view, the C-arm was positioned so that an oblique view of the foramen as noted above was visualized. The target area is just inferior to the "nose of the scotty dog" or sub pedicular. The soft tissues overlying this structure were infiltrated with 2-3 ml. of 1% Lidocaine without Epinephrine.  The spinal needle was inserted toward the target using a "trajectory" view along the fluoroscope beam.  Under AP and lateral visualization, the needle was advanced so it did not puncture dura and was located close the 6 O'Clock position of the pedical in AP tracterory. Biplanar projections were used to confirm position. Aspiration was confirmed to be negative for CSF and/or blood. A 1-2 ml. volume of Isovue-250 was injected and flow of contrast was noted at each level. Radiographs were obtained for documentation purposes.   After attaining the desired flow of contrast documented above, a 0.5 to 1.0 ml test dose of 0.25% Marcaine was injected into each respective transforaminal space.  The patient was observed for 90 seconds post injection.  After no sensory deficits were reported, and normal lower extremity motor function was noted,  the above injectate was administered so that equal amounts of the injectate were placed at each foramen (level) into the transforaminal epidural space.   Additional Comments:  The patient tolerated the procedure well Dressing: 2 x 2 sterile gauze and Band-Aid    Post-procedure details: Patient was observed during the  procedure. Post-procedure instructions were reviewed.  Patient left the clinic in stable condition.   Clinical History: MRI LUMBAR SPINE WITHOUT CONTRAST   TECHNIQUE: Multiplanar, multisequence MR imaging of the lumbar spine was performed. No intravenous contrast was administered.   COMPARISON:  None.   FINDINGS: Segmentation:  Standard.   Alignment: Grade 1 anterolisthesis of L5 on S1 secondary to bilateral L5 pars interarticularis defects.   Vertebrae:  No fracture, evidence of discitis, or bone lesion.   Conus medullaris and cauda equina: Conus extends to the T12-L1 level. Conus and cauda equina appear normal.   Paraspinal and other soft tissues: Negative.   Disc levels:   Disc spaces: Degenerative disease with disc height loss at L4-5 and L5-S1. Disc desiccation throughout the lumbar spine.   T12-L1: Minimal broad-based disc bulge. No evidence of neural foraminal stenosis. No central canal stenosis.   L1-L2: Minimal broad-based disc bulge. Mild bilateral facet arthropathy. No evidence of neural foraminal stenosis. No central canal stenosis.   L2-L3: Mild broad-based disc bulge. Mild bilateral facet arthropathy. No evidence of neural foraminal stenosis. No central canal stenosis.   L3-L4: Mild broad-based disc bulge. Severe right and moderate left facet arthropathy. No evidence of neural foraminal stenosis. No central canal stenosis.   L4-L5: Broad-based disc bulge. Mild bilateral facet arthropathy. Mild bilateral foraminal narrowing. No central canal stenosis.   L5-S1: No significant disc protrusion. Mild bilateral facet arthropathy. Severe bilateral foraminal stenosis. No central canal stenosis.   IMPRESSION: 1. At L5-S1 there is mild bilateral facet arthropathy. Severe bilateral foraminal stenosis. Grade 1 anterolisthesis of L5 on S1 secondary to bilateral L5 pars interarticularis defects. 2. Lumbar spine spondylosis as described above.      Electronically Signed   By: Kathreen Devoid   On: 07/05/2020 11:36     Objective:  VS:  HT:    WT:   BMI:     BP:135/87  HR:76bpm  TEMP: ( )  RESP:  Physical Exam Vitals and nursing note reviewed.  Constitutional:      General: She is not in acute distress.    Appearance: Normal appearance. She is not ill-appearing.  HENT:     Head: Normocephalic and atraumatic.     Right Ear: External ear normal.     Left Ear: External ear normal.  Eyes:     Extraocular Movements: Extraocular movements intact.  Cardiovascular:     Rate and Rhythm: Normal rate.     Pulses: Normal pulses.  Pulmonary:     Effort: Pulmonary effort is normal. No respiratory distress.  Abdominal:     General: There is no distension.     Palpations: Abdomen is soft.  Musculoskeletal:        General: Tenderness present.     Cervical back: Neck supple.     Right lower leg: No edema.     Left lower leg: No edema.     Comments: Patient has good distal strength with no pain over the greater trochanters.  No clonus or focal weakness.  Skin:    Findings: No erythema, lesion or rash.  Neurological:     General: No focal deficit present.     Mental Status: She  is alert and oriented to person, place, and time.     Sensory: No sensory deficit.     Motor: No weakness or abnormal muscle tone.     Coordination: Coordination normal.  Psychiatric:        Mood and Affect: Mood normal.        Behavior: Behavior normal.     Imaging: No results found.

## 2021-05-25 ENCOUNTER — Encounter: Payer: Self-pay | Admitting: Internal Medicine

## 2021-05-27 ENCOUNTER — Encounter: Payer: Self-pay | Admitting: *Deleted

## 2021-05-29 ENCOUNTER — Other Ambulatory Visit: Payer: Self-pay | Admitting: Internal Medicine

## 2021-05-30 ENCOUNTER — Other Ambulatory Visit: Payer: Self-pay | Admitting: *Deleted

## 2021-05-30 DIAGNOSIS — Z170 Estrogen receptor positive status [ER+]: Secondary | ICD-10-CM

## 2021-05-30 DIAGNOSIS — C50412 Malignant neoplasm of upper-outer quadrant of left female breast: Secondary | ICD-10-CM | POA: Insufficient documentation

## 2021-05-31 NOTE — Progress Notes (Signed)
Radiation Oncology         [phone] ________________________________  Initial Outpatient Consultation  Name: Christy Young MRN: [id number]  Date: 06/01/2021  DOB: [birth year]  WU:[id number], Standley Brooking, MD  Donnie Mesa, MD   REFERRING PHYSICIAN: Donnie Mesa, MD  DIAGNOSIS:    ICD-10-CM   1. Malignant neoplasm of upper-outer quadrant of left breast in female, estrogen receptor positive ([place])  C50.412    Z17.0       Cancer Staging  Malignant neoplasm of upper-outer quadrant of left breast in female, estrogen receptor positive ([place]) Staging form: Breast, AJCC 8th Edition - Clinical stage from 06/01/2021: Stage IIA (cT2, cN0, cM0, G3, ER+, PR+, HER2-) - Unsigned Stage prefix: Initial diagnosis Histologic grading system: 3 grade system    CHIEF COMPLAINT: Here to discuss management of left breast cancer  HISTORY OF PRESENT ILLNESS::Christy Young is a 75 y.o. female who underwent a bilateral diagnostic mammogram on 05/02/21 which revealed a 2.4 cm irregular high density mass in the left breast as highly suggestive of malignancy.  Symptoms, if any, at that time were a 2 to 3 week history of a palpable left breast lump, with associated tenderness.   Ultrasound of the left breast on 05/02/21 again revealed a 2.1 x 1.8 x 2.3 cm irregular mass in the left breast, 1 o'clock position, posterior depth, as highly suggestive of malignancy. An additional mass was also identified in the 1 o'clock position measuring 0.9 x 0.5 x 0.7 cm, also highly suggestive malignancy, as well as three small 3-4 mm round masses, favored to be small cysts, located at the 2:30-3:00 position, 11-12 cmfn.     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 A right breast biopsy was performed on 12/15/19 which revealed benign fibroadenoma with calcifications present.    She used to live in [place].  She has since retired in [place].  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    PAST SURGICAL HISTORY: Past Surgical History:  Procedure Laterality Date   ABDOMINAL HYSTERECTOMY     BREAST BIOPSY     right side   FACIAL COSMETIC SURGERY  1980    FAMILY HISTORY: family history includes Alcohol abuse in her father; Arthritis in her mother; Diabetes in her father; Heart attack in her brother; Heart attack (age of onset: 84) in her son; Hypertension in her mother; Lung cancer in her brother, brother, and brother; Lymphoma in her sister; Prostate cancer in her brother.  SOCIAL HISTORY:  reports that she has never smoked. She has never used smokeless tobacco. She reports current alcohol use of about 3.0 standard drinks per week. She reports that she does not use drugs.  ALLERGIES: Pravastatin, Rosuvastatin, and Penicillins  MEDICATIONS:  Current Outpatient Medications  Medication Sig Dispense Refill   allopurinol (ZYLOPRIM) 100 MG tablet TAKE 2 TABLETS BY MOUTH EVERY DAY 180 tablet 0   furosemide (LASIX) 20 MG tablet Take 1 tablet (20 mg total) by mouth daily.  60 tablet 3   nebivolol (BYSTOLIC) 5 MG tablet TAKE 1 TABLET DAILY 90 tablet 0   Omega-3 Fatty Acids (FISH OIL) 1000 MG CPDR Take by mouth.     rosuvastatin (CRESTOR) 20 MG tablet Take 1 tablet (20 mg total) by mouth daily. 30 tablet 3   Vitamin D, Ergocalciferol, (DRISDOL) 1.25 MG (50000 UNIT) CAPS capsule Take 1 capsule  (50,000 Units total) by mouth every 7 (seven) days. 12 capsule 0   No current facility-administered medications for this encounter.    REVIEW OF SYSTEMS: As above in HPI.   PHYSICAL EXAM:  vitals were not taken for this visit.   General: Alert and oriented, in no acute distress HEENT: Head is normocephalic. Extraocular movements are intact.   Neck: Neck is supple, no palpable cervical or supraclavicular lymphadenopathy. Heart: Regular in rate and rhythm with no murmurs, rubs, or gallops. Chest: Clear to auscultation bilaterally, with no rhonchi, wheezes, or rales. Abdomen: Soft, nontender, nondistended, with no rigidity or guarding. Lymphatics: see Neck Exam Skin: No concerning lesions. Musculoskeletal: symmetric strength and muscle tone throughout. Neurologic: Cranial nerves II through XII are grossly intact. No obvious focalities. Speech is fluent. Coordination is intact. Psychiatric: Judgment and insight are intact. Affect is appropriate. Breasts: In the upper outer left breast there is mild thickening over a 2 to 3 cm region, this is subtle. No other palpable masses appreciated in the breasts or axillae bilaterally.    ECOG = 0  0 - Asymptomatic (Fully active, able to carry on all predisease activities without restriction)  1 - Symptomatic but completely ambulatory (Restricted in physically strenuous activity but ambulatory and able to carry out work of a light or sedentary nature. For example, light housework, office work)  2 - Symptomatic, <50% in bed during the day (Ambulatory and capable of all self care but unable to carry out any work activities. Up and about more than 50% of waking hours)  3 - Symptomatic, >50% in bed, but not bedbound (Capable of only limited self-care, confined to bed or chair 50% or more of waking hours)  4 - Bedbound (Completely disabled. Cannot carry on any self-care. Totally confined to bed or chair)  5 - Death   Eustace Pen MM, Creech RH, Tormey DC, et  al. [phone]). "Toxicity and response criteria of the [facility]". [place] Oncol. 5 (6): 649-55   LABORATORY DATA:  Lab Results  Component Value Date   WBC 8.2 06/01/2021   HGB 13.5 06/01/2021   HCT 40.3 06/01/2021   MCV 88.6 06/01/2021   PLT 276 06/01/2021   CMP     Component Value Date/Time   NA 144 06/01/2021 1222   K 3.9 06/01/2021 1222   CL 110 06/01/2021 1222   CO2 23 06/01/2021 1222   GLUCOSE 111 (H) 06/01/2021 1222   BUN 18 06/01/2021 1222   CREATININE 0.86 06/01/2021 1222   CREATININE 0.66 02/18/2020 1050   CALCIUM 10.9 (H) 06/01/2021 1222   PROT 7.1 06/01/2021 1222   ALBUMIN 3.8 06/01/2021 1222   AST 17 06/01/2021 1222   ALT 34 06/01/2021 1222   ALKPHOS 64 06/01/2021 1222   BILITOT 0.9 06/01/2021 1222   GFRNONAA >60 06/01/2021 1222         RADIOGRAPHY: Epidural Steroid injection  Result Date: 05/10/2021 Magnus Sinning, MD     05/23/2021  8:15 PM Lumbosacral Transforaminal Epidural Steroid Injection - Sub-Pedicular Approach with Fluoroscopic Guidance Patient: Christy Young     Date  of Birth: 03/05/1946 MRN: [id number] PCP: Burnis Medin, MD     Visit Date: 05/10/2021  Universal Protocol:   Date/Time: 05/10/2021 Consent Given By: the patient Position: PRONE Additional Comments: Vital signs were monitored before and after the procedure. Patient was prepped and draped in the usual sterile fashion. The correct patient, procedure, and site was verified. Injection Procedure Details: Procedure diagnoses: Lumbar radiculopathy [M54.16]  Meds Administered: Meds ordered this encounter Medications  methylPREDNISolone acetate (DEPO-MEDROL) injection 80 mg Laterality: Left Location/Site: L5 Needle:5.0 in., 22 ga.  Short bevel or Quincke spinal needle Needle Placement: Transforaminal Findings:   -Comments: Excellent flow of contrast along the nerve, nerve root and into the epidural space. Procedure Details: After squaring off the end-plates to get a true AP  view, the C-arm was positioned so that an oblique view of the foramen as noted above was visualized. The target area is just inferior to the "nose of the scotty dog" or sub pedicular. The soft tissues overlying this structure were infiltrated with 2-3 ml. of 1% Lidocaine without Epinephrine. The spinal needle was inserted toward the target using a "trajectory" view along the fluoroscope beam.  Under AP and lateral visualization, the needle was advanced so it did not puncture dura and was located close the 6 O'Clock position of the pedical in AP tracterory. Biplanar projections were used to confirm position. Aspiration was confirmed to be negative for CSF and/or blood. A 1-2 ml. volume of Isovue-250 was injected and flow of contrast was noted at each level. Radiographs were obtained for documentation purposes. After attaining the desired flow of contrast documented above, a 0.5 to 1.0 ml test dose of 0.25% Marcaine was injected into each respective transforaminal space.  The patient was observed for 90 seconds post injection.  After no sensory deficits were reported, and normal lower extremity motor function was noted,   the above injectate was administered so that equal amounts of the injectate were placed at each foramen (level) into the transforaminal epidural space. Additional Comments: The patient tolerated the procedure well Dressing: 2 x 2 sterile gauze and Band-Aid  Post-procedure details: Patient was observed during the procedure. Post-procedure instructions were reviewed. Patient left the clinic in stable condition.   XR C-ARM NO REPORT  Result Date: 05/10/2021 Please see Notes tab for imaging impression.     IMPRESSION/PLAN: This is a very pleasant 75 year old woman with T2N0 left breast cancer, ER positive  She anticipates breast conserving surgery and sentinel lymph node biopsy  It was a pleasure meeting the patient today. We discussed the risks, benefits, and side effects of radiotherapy. I  recommend radiotherapy to the left breast adjuvantly to reduce her risk of locoregional recurrence by 2/3.  We discussed that radiation would take approximately 4 weeks to complete and that I would give the patient a few weeks to heal following surgery before starting treatment planning.  If chemotherapy were to be given, this would precede radiotherapy. We spoke about acute effects including skin irritation and fatigue as well as much less common late effects including internal organ injury or irritation. We spoke about the latest technology that is used to minimize the risk of late effects for patients undergoing radiotherapy to the breast or chest wall. No guarantees of treatment were given. The patient is enthusiastic about proceeding with treatment. I look forward to participating in the patient's care.  I will await her referral back to me for postoperative follow-up and eventual CT simulation/treatment planning.  On date of service,  in total, I spent 40 minutes on this encounter. Patient was seen in person.   __________________________________________   Eppie Gibson, MD  This document serves as a record of services personally performed by Eppie Gibson, MD. It was created on her behalf by Roney Mans, a trained medical scribe. The creation of this record is based on the scribe's personal observations and the provider's statements to them. This document has been checked and approved by the attending provider.

## 2021-06-01 ENCOUNTER — Other Ambulatory Visit: Payer: Self-pay

## 2021-06-01 ENCOUNTER — Ambulatory Visit
Admission: RE | Admit: 2021-06-01 | Discharge: 2021-06-01 | Disposition: A | Discharge status: Home / Self Care | Payer: Medicare Other | Source: Ambulatory Visit | Attending: Radiation Oncology | Admitting: Radiation Oncology

## 2021-06-01 ENCOUNTER — Encounter: Payer: Self-pay | Admitting: Radiation Oncology

## 2021-06-01 ENCOUNTER — Encounter: Payer: Self-pay | Admitting: Hematology

## 2021-06-01 ENCOUNTER — Inpatient Hospital Stay (HOSPITAL_BASED_OUTPATIENT_CLINIC_OR_DEPARTMENT_OTHER): Payer: Medicare Other | Admitting: Hematology

## 2021-06-01 ENCOUNTER — Ambulatory Visit: Payer: Medicare Other | Attending: Surgery | Admitting: Physical Therapy

## 2021-06-01 ENCOUNTER — Inpatient Hospital Stay: Payer: Medicare Other | Attending: Hematology

## 2021-06-01 ENCOUNTER — Encounter: Payer: Self-pay | Admitting: General Practice

## 2021-06-01 ENCOUNTER — Ambulatory Visit: Payer: Self-pay | Admitting: Surgery

## 2021-06-01 ENCOUNTER — Encounter: Payer: Self-pay | Admitting: Physical Therapy

## 2021-06-01 VITALS — BP 131/68 | HR 95 | Temp 97.3°F | Resp 18 | Ht 64.0 in | Wt 211.3 lb

## 2021-06-01 DIAGNOSIS — R293 Abnormal posture: Secondary | ICD-10-CM | POA: Insufficient documentation

## 2021-06-01 DIAGNOSIS — Z170 Estrogen receptor positive status [ER+]: Secondary | ICD-10-CM | POA: Insufficient documentation

## 2021-06-01 DIAGNOSIS — C50412 Malignant neoplasm of upper-outer quadrant of left female breast: Secondary | ICD-10-CM

## 2021-06-01 DIAGNOSIS — C50912 Malignant neoplasm of unspecified site of left female breast: Secondary | ICD-10-CM

## 2021-06-01 LAB — CMP (CANCER CENTER ONLY)
ALT: 34 U/L (ref 0–44)
AST: 17 U/L (ref 15–41)
Albumin: 3.8 g/dL (ref 3.5–5.0)
Alkaline Phosphatase: 64 U/L (ref 38–126)
Anion gap: 11 (ref 5–15)
BUN: 18 mg/dL (ref 8–23)
CO2: 23 mmol/L (ref 22–32)
Calcium: 10.9 mg/dL — ABNORMAL HIGH (ref 8.9–10.3)
Chloride: 110 mmol/L (ref 98–111)
Creatinine: 0.86 mg/dL (ref 0.44–1.00)
GFR, Estimated: >60 mL/min (ref >60)
Glucose, Bld: 111 mg/dL — ABNORMAL HIGH (ref 70–99)
Potassium: 3.9 mmol/L (ref 3.5–5.1)
Sodium: 144 mmol/L (ref 135–145)
Total Bilirubin: 0.9 mg/dL (ref 0.3–1.2)
Total Protein: 7.1 g/dL (ref 6.5–8.1)

## 2021-06-01 LAB — CBC WITH DIFFERENTIAL (CANCER CENTER ONLY)
Abs Immature Granulocytes: 0.01 10*3/uL (ref 0.00–0.07)
Basophils Absolute: 0.1 10*3/uL (ref 0.0–0.1)
Basophils Relative: 1 %
Eosinophils Absolute: 0.1 10*3/uL (ref 0.0–0.5)
Eosinophils Relative: 2 %
HCT: 40.3 % (ref 36.0–46.0)
Hemoglobin: 13.5 g/dL (ref 12.0–15.0)
Immature Granulocytes: 0 %
Lymphocytes Relative: 36 %
Lymphs Abs: 3 10*3/uL (ref 0.7–4.0)
MCH: 29.7 pg (ref 26.0–34.0)
MCHC: 33.5 g/dL (ref 30.0–36.0)
MCV: 88.6 fL (ref 80.0–100.0)
Monocytes Absolute: 0.7 10*3/uL (ref 0.1–1.0)
Monocytes Relative: 9 %
Neutro Abs: 4.3 10*3/uL (ref 1.7–7.7)
Neutrophils Relative %: 52 %
Platelet Count: 276 10*3/uL (ref 150–400)
RBC: 4.55 MIL/uL (ref 3.87–5.11)
RDW: 13.2 % (ref 11.5–15.5)
WBC Count: 8.2 10*3/uL (ref 4.0–10.5)
nRBC: 0 % (ref 0.0–0.2)

## 2021-06-01 LAB — GENETIC SCREENING ORDER

## 2021-06-01 NOTE — H&P (Signed)
Subjective    Chief Complaint: Breast Cancer   Breast MDC 12/7 Feng/ [place]   History of Present Illness: Christy Young is a 75 y.o. female who is seen today as an office consultation at the request of Dr. [name] for evaluation of Breast Cancer .   This is a 75-year-old female who presented with approximately 1 month of a palpable mass in the left upper outer quadrant of her breast.  She has no family history of breast cancer.  She underwent work-up including diagnostic mammogram and ultrasound.  This showed a 2.4 x 1.8 x 2.3 cm mass in the left upper outer quadrant at approximately 2:00 measuring 13 cm from the nipple.  She was also noted to have a 3 mm mass anterior to the primary mass.  Both of these areas were biopsied.  These revealed invasive ductal carcinoma grade 3, ER/PR positive, HER2 negative, Ki-67 40%.  The 2 biopsy clips are approximately 4 cm apart.  She presents now to discuss her treatment options.     Review of Systems: A complete review of systems was obtained from the patient.  I have reviewed this information and discussed as appropriate with the patient.  See HPI as well for other ROS.   Review of Systems  Constitutional: Negative.   HENT: Positive for sinus pain.   Eyes: Negative.   Respiratory: Positive for cough.   Cardiovascular: Negative.   Gastrointestinal: Negative.   Genitourinary: Negative.   Musculoskeletal: Positive for back pain, joint pain and myalgias.  Skin: Negative.   Neurological: Positive for weakness.  Endo/Heme/Allergies: Negative.   Psychiatric/Behavioral: Negative.         Medical History: Past Medical History      Past Medical History:  Diagnosis Date   History of cancer     Hyperlipidemia     Hypertension             Patient Active Problem List  Diagnosis   Malignant neoplasm of upper-outer quadrant of left breast in female, estrogen receptor positive (CMS-HCC)   Essential (primary) hypertension   Hyperlipidemia       Past Surgical History       Past Surgical History:  Procedure Laterality Date   HYSTERECTOMY N/A      Date Unknown        Allergies       Allergies  Allergen Reactions   Pravastatin Other (See Comments)      Body MS aches and pain s   Rosuvastatin Other (See Comments)      REACTION: leg cramps.   Penicillins Other (See Comments)      Patient  says she is not allergic to amoxicillin or augmentin   Has taken these wo problem ? Of SE when she was 19  Not sever             Family History       Family History  Problem Relation Age of Onset   High blood pressure (Hypertension) Mother     Arthritis Mother     Alcohol abuse Father     Diabetes Father     Lymphoma Sister     Myocardial Infarction (Heart attack) Brother     Lung cancer Brother     Myocardial Infarction (Heart attack) Son          Social History       Tobacco Use  Smoking Status Never  Smokeless Tobacco Never      Social History  Social History          Socioeconomic History   Marital status: Unknown  Tobacco Use   Smoking status: Never   Smokeless tobacco: Never  Vaping Use   Vaping Use: Unknown  Substance and Sexual Activity   Alcohol use: Defer   Drug use: Defer        Objective:      Physical Exam    Constitutional:  WDWN in NAD, conversant, no obvious deformities; lying in bed comfortably Eyes:  Pupils equal, round; sclera anicteric; moist conjunctiva; no lid lag HENT:  Oral mucosa moist; good dentition  Neck:  No masses palpated, trachea midline; no thyromegaly Lungs:  CTA bilaterally; normal respiratory effort Breasts:  symmetric, no nipple changes; no palpable masses or lymphadenopathy on either side CV:  Regular rate and rhythm; no murmurs; extremities well-perfused with no edema Abd:  +bowel sounds, soft, non-tender, no palpable organomegaly; no palpable hernias Musc:  Unable to assess gait; no apparent clubbing or cyanosis in extremities Lymphatic:  No palpable cervical or  axillary lymphadenopathy Skin:  Warm, dry; no sign of jaundice Psychiatric - alert and oriented x 4; calm mood and affect     Labs, Imaging and Diagnostic Testing: Invasive ductal carcinoma Grade 3 ER/ PR + Her 2 negative, Ki-67 40%   Assessment and Plan:  Diagnoses and all orders for this visit:   Malignant neoplasm of upper-outer quadrant of left breast in female, estrogen receptor positive (CMS-HCC)     Left bracketed radioactive seed localized lumpectomy with sentinel lymph node biopsy with radiotracer and Magtrace.  The surgical procedure has been discussed with the patient.  Potential risks, benefits, alternative treatments, and expected outcomes have been explained.  All of the patient's questions at this time have been answered.  The likelihood of reaching the patient's treatment goal is good.  The patient understand the proposed surgical procedure and wishes to proceed.   Surgery will be followed by adjuvant radiation and anti-estrogens.     Junell Cullifer KAI Bernell Sigal, MD  06/01/2021 3:17 PM   

## 2021-06-01 NOTE — Patient Instructions (Signed)

## 2021-06-01 NOTE — Progress Notes (Signed)
[place] Psychosocial Distress Screening Spiritual Care  Met with Christy Young in [facility] to introduce [facility] team/resources, reviewing distress screen per protocol.  The patient scored a 2 on the Psychosocial Distress Thermometer which indicates mild distress. Also assessed for distress and other psychosocial needs.   ONCBCN DISTRESS SCREENING 06/01/2021  Screening Type Initial Screening  Distress experienced in past week (1-10) 2  Spiritual/Religous concerns type Facing my mortality  Physical Problem type Pain;Tingling hands/feet;Swollen arms/legs  Referral to support programs Yes   Christy Young reports valuing being alone so that she doesn't need to focus on other people's feelings/anxiety. Perspective across life's challenges is helping her cope.   Follow up needed: No. Per Christy Young, no other needs or concerns at this time. She is aware of ongoing [place] team and programming availability.   [place], [place], [facility] Pager [phone] Voicemail [phone]
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
## 2021-06-01 NOTE — H&P (View-Only) (Signed)
Subjective    Chief Complaint: Breast Cancer   [name] 12/7 Christy Young/ Christy Young   History of Present Illness: Christy Young is a 75 y.o. female who is seen today as an office consultation at the request of Dr. Regis Bill for evaluation of Breast Cancer .   This is a 75 year old female who presented with approximately 1 month of a palpable mass in the left upper outer quadrant of her breast.  She has no family history of breast cancer.  She underwent work-up including diagnostic mammogram and ultrasound.  This showed a 2.4 x 1.8 x 2.3 cm mass in the left upper outer quadrant at approximately 2:00 measuring 13 cm from the nipple.  She was also noted to have a 3 mm mass anterior to the primary mass.  Both of these areas were biopsied.  These revealed invasive ductal carcinoma grade 3, ER/PR positive, HER2 negative, Ki-67 40%.  The 2 biopsy clips are approximately 4 cm apart.  She presents now to discuss her treatment options.     Review of Systems: A complete review of systems was obtained from the patient.  I have reviewed this information and discussed as appropriate with the patient.  See HPI as well for other ROS.   Review of Systems  Constitutional: Negative.   HENT: Positive for sinus pain.   Eyes: Negative.   Respiratory: Positive for cough.   Cardiovascular: Negative.   Gastrointestinal: Negative.   Genitourinary: Negative.   Musculoskeletal: Positive for back pain, joint pain and myalgias.  Skin: Negative.   Neurological: Positive for weakness.  Endo/Heme/Allergies: Negative.   Psychiatric/Behavioral: Negative.         Medical History: Past Medical History      Past Medical History:  Diagnosis Date   History of cancer     Hyperlipidemia     Hypertension             Patient Active Problem List  Diagnosis   Malignant neoplasm of upper-outer quadrant of left breast in female, estrogen receptor positive (CMS-HCC)   Essential (primary) hypertension   Hyperlipidemia       Past Surgical History       Past Surgical History:  Procedure Laterality Date   HYSTERECTOMY N/A      Date Unknown        Allergies       Allergies  Allergen Reactions   Pravastatin Other (See Comments)      Body MS aches and pain s   Rosuvastatin Other (See Comments)      REACTION: leg cramps.   Penicillins Other (See Comments)      Patient  says she is not allergic to amoxicillin or augmentin   Has taken these wo problem ? Of SE when she was 33  Not sever             Family History       Family History  Problem Relation Age of Onset   High blood pressure (Hypertension) Mother     Arthritis Mother     Alcohol abuse Father     Diabetes Father     Lymphoma Sister     Myocardial Infarction (Heart attack) Brother     Lung cancer Brother     Myocardial Infarction (Heart attack) Son          Social History       Tobacco Use  Smoking Status Never  Smokeless Tobacco Never      Social History  Social History  Socioeconomic History   Marital status: Unknown  Tobacco Use   Smoking status: Never   Smokeless tobacco: Never  Vaping Use   Vaping Use: Unknown  Substance and Sexual Activity   Alcohol use: Defer   Drug use: Defer        Objective:      Physical Exam    Constitutional:  WDWN in NAD, conversant, no obvious deformities; lying in bed comfortably Eyes:  Pupils equal, round; sclera anicteric; moist conjunctiva; no lid lag HENT:  Oral mucosa moist; good dentition  Neck:  No masses palpated, trachea midline; no thyromegaly Lungs:  CTA bilaterally; normal respiratory effort Breasts:  symmetric, no nipple changes; no palpable masses or lymphadenopathy on either side CV:  Regular rate and rhythm; no murmurs; extremities well-perfused with no edema Abd:  +bowel sounds, soft, non-tender, no palpable organomegaly; no palpable hernias Musc:  Unable to assess gait; no apparent clubbing or cyanosis in extremities Lymphatic:  No palpable cervical or  axillary lymphadenopathy Skin:  Warm, dry; no sign of jaundice Psychiatric - alert and oriented x 4; calm mood and affect     Labs, Imaging and Diagnostic Testing: Invasive ductal carcinoma Grade 3 ER/ PR + Her 2 negative, Ki-67 40%   Assessment and Plan:  Diagnoses and all orders for this visit:   Malignant neoplasm of upper-outer quadrant of left breast in female, estrogen receptor positive (CMS-HCC)     Left bracketed radioactive seed localized lumpectomy with sentinel lymph node biopsy with radiotracer and Magtrace.  The surgical procedure has been discussed with the patient.  Potential risks, benefits, alternative treatments, and expected outcomes have been explained.  All of the patient's questions at this time have been answered.  The likelihood of reaching the patient's treatment goal is good.  The patient understand the proposed surgical procedure and wishes to proceed.   Surgery will be followed by adjuvant radiation and anti-estrogens.     Christy Spiller Jearld Adjutant, MD  06/01/2021 3:17 PM

## 2021-06-01 NOTE — Therapy (Signed)
[facility] @ [place], [place], [address] Phone: [phone]   Fax:  [phone]  Physical Therapy Evaluation  Patient Details  Name: Christy Young MRN: [id number] Date of Birth: March 08, 1946 Referring Provider (PT): Dr. Donnie Mesa   Encounter Date: 06/01/2021   PT End of Session - 06/01/21 1432     Visit Number 1    Number of Visits 2    Date for PT Re-Evaluation 07/27/21    PT Start Time 1332    PT Stop Time 1400   Also saw pt from 1410-1420 for a total of 38 minutes   PT Time Calculation (min) 28 min    Activity Tolerance Patient tolerated treatment well    Behavior During Therapy [facility] for tasks assessed/performed             Past Medical History:  Diagnosis Date   ARTHRITIS    Breast cancer ([place])    Carpal tunnel syndrome 08/23/2015   Left   Fatigue    Gout, unspecified    Hyperlipidemia    Hypertension    meds since age 34    Hyperuricemia    Nonspecific abnormal electrocardiogram (ECG) (EKG)    t wave non acute     OBESITY    Palpitations    hospitalized 2005 felt from stress neg cards eval   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    Past Surgical History:  Procedure Laterality Date   ABDOMINAL HYSTERECTOMY     BREAST BIOPSY     right side   FACIAL COSMETIC SURGERY  1980    There were no vitals filed for this visit.    Subjective Assessment - 06/01/21 1407     Subjective Patient reports she is here today to be seen by her medical team for her newly diagnosed left breast cancer.    Pertinent History Patient was diagnosed on 05/02/2021 with left grade III invasive ductal carcinoma breast cancer. It measures 2.1 cm with another area measuring 3 mm in the upper outer quadrant. It is ER/PR positive and HER2 negative with a Ki67 of 40%.    Patient Stated Goals Reduce lymphedema risk and learn post op shoulder ROM HEP    Currently in Pain? Yes    Pain Score --    Varies   Pain Location --   "All over from arthritis"   Pain Descriptors / Indicators Aching    Pain Type Chronic pain    Pain Onset More than a month ago    Pain Frequency Intermittent    Aggravating Factors  staying still    Pain Relieving Factors moving around                [facility] PT Assessment - 06/01/21 0001       Assessment   Medical Diagnosis Left breast cancer    Referring Provider (PT) Dr. Donnie Mesa    Onset Date/Surgical Date 05/02/21    Hand Dominance Right    Prior Therapy none      Precautions   Precautions Other (comment)    Precaution Comments active cancer      Restrictions   Weight Bearing Restrictions No      Balance Screen   Has the patient fallen in the past 6 months No    Has the patient had a decrease in activity level because of a fear of falling?  No    Is the patient reluctant to  leave their home because of a fear of falling?  No      Home Environment   Living Environment Private residence    Living Arrangements Alone    Available Help at Discharge Family      Prior Function   Level of [place] Retired    Leisure She does not exercise      Cognition   Overall Cognitive Status Within Functional Limits for tasks assessed      Posture/Postural Control   Posture/Postural Control Postural limitations    Postural Limitations Rounded Shoulders;Forward head      ROM / Strength   AROM / PROM / Strength AROM;Strength      AROM   Overall AROM Comments Cervical AROM is WNL    AROM Assessment Site Shoulder    Right/Left Shoulder Right;Left    Right Shoulder Extension 50 Degrees    Right Shoulder Flexion 152 Degrees    Right Shoulder ABduction 157 Degrees    Right Shoulder Internal Rotation 74 Degrees    Right Shoulder External Rotation 73 Degrees    Left Shoulder Extension 40 Degrees    Left Shoulder Flexion 144 Degrees    Left Shoulder ABduction 150 Degrees    Left Shoulder Internal Rotation 72 Degrees     Left Shoulder External Rotation 60 Degrees      Strength   Overall Strength Within functional limits for tasks performed               LYMPHEDEMA/ONCOLOGY QUESTIONNAIRE - 06/01/21 0001       Type   Cancer Type Left breast cancer      Lymphedema Assessments   Lymphedema Assessments Upper extremities      Right Upper Extremity Lymphedema   10 cm Proximal to Olecranon Process 32.3 cm    Olecranon Process 26 cm    10 cm Proximal to Ulnar Styloid Process 24.2 cm    Just Proximal to Ulnar Styloid Process 18.8 cm    Across Hand at [employer] 19.4 cm    At [place] of 2nd Digit 6.5 cm      Left Upper Extremity Lymphedema   10 cm Proximal to Olecranon Process 35.9 cm    Olecranon Process 28.3 cm    10 cm Proximal to Ulnar Styloid Process 24.5 cm    Just Proximal to Ulnar Styloid Process 19.5 cm    Across Hand at [employer] 19.6 cm    At [place] of 2nd Digit 6.4 cm             L-DEX FLOWSHEETS - 06/01/21 1400       L-DEX LYMPHEDEMA SCREENING   Measurement Type Unilateral    L-DEX MEASUREMENT EXTREMITY Upper Extremity    POSITION  Standing    DOMINANT SIDE Right    At Risk Side Left    BASELINE SCORE (UNILATERAL) 2.9             The patient was assessed using the L-Dex machine today to produce a lymphedema index baseline score. The patient will be reassessed on a regular basis (typically every 3 months) to obtain new L-Dex scores. If the score is > 6.5 points away from his/her baseline score indicating onset of subclinical lymphedema, it will be recommended to wear a compression garment for 4 weeks, 12 hours per day and then be reassessed. If the score continues to be > 6.5 points from baseline at reassessment, we will initiate lymphedema treatment. Assessing  in this manner has a 95% rate of preventing clinically significant lymphedema.        Objective measurements completed on examination: See above findings.            Patient was instructed  today in a home exercise program today for post op shoulder range of motion. These included active assist shoulder flexion in sitting, scapular retraction, wall walking with shoulder abduction, and hands behind head external rotation.  She was encouraged to do these twice a day, holding 3 seconds and repeating 5 times when permitted by her physician.      PT Education - 06/01/21 1431     Education Details Lymphedema risk reduction practices and post op HEP    Person(s) Educated Patient    Methods Explanation;Demonstration;Handout    Comprehension Returned demonstration;Verbalized understanding                 PT Long Term Goals - 06/01/21 1435       PT LONG TERM GOAL #1   Title Patient will demonstrate she has regained full shoulder ROM and function post operatively compared to baselines.    Time 8    Period Weeks    Status New    Target Date 07/27/21             Breast Clinic Goals - 06/01/21 1434       Patient will be able to verbalize understanding of pertinent lymphedema risk reduction practices relevant to her diagnosis specifically related to skin care.   Time 1    Period Days    Status Achieved      Patient will be able to return demonstrate and/or verbalize understanding of the post-op home exercise program related to regaining shoulder range of motion.   Time 1    Period Days    Status Achieved      Patient will be able to verbalize understanding of the importance of attending the postoperative After Breast Cancer Class for further lymphedema risk reduction education and therapeutic exercise.   Time 1    Period Days    Status Achieved                   Plan - 06/01/21 1433     Clinical Impression Statement Patient was diagnosed on 05/02/2021 with left grade III invasive ductal carcinoma breast cancer. It measures 2.1 cm with another area measuring 3 mm in the upper outer quadrant. It is ER/PR positive and HER2 negative with a Ki67 of 40%.     Stability/Clinical Decision Making Stable/Uncomplicated    Clinical Decision Making Low    Rehab Potential Excellent    PT Frequency --   Eval and 1 f/u visit   PT Treatment/Interventions ADLs/Self Care Home Management;Therapeutic exercise;Patient/family education    PT Next Visit Plan Will reassess 3-4 weeks post op    PT Home Exercise Plan Post op HEP    Consulted and Agree with Plan of Care Patient             Patient will benefit from skilled therapeutic intervention in order to improve the following deficits and impairments:  Decreased range of motion, Postural dysfunction, Decreased knowledge of precautions, Impaired UE functional use, Pain  Visit Diagnosis: Malignant neoplasm of upper-outer quadrant of left breast in female, estrogen receptor positive ([place]) - Plan: PT plan of care cert/re-cert  Abnormal posture - Plan: PT plan of care cert/re-cert  Patient will follow up at outpatient cancer rehab 3-4 weeks  following surgery.  If the patient requires physical therapy at that time, a specific plan will be dictated and sent to the referring physician for approval. The patient was educated today on appropriate basic range of motion exercises to begin post operatively and the importance of attending the After Breast Cancer class following surgery.  Patient was educated today on lymphedema risk reduction practices as it pertains to recommendations that will benefit the patient immediately following surgery.  She verbalized good understanding.      Problem List Patient Active Problem List   Diagnosis Date Noted   Malignant neoplasm of upper-outer quadrant of left breast in female, estrogen receptor positive ([place]) 05/30/2021   Pain of left lower extremity 03/16/2021   Exertional dyspnea 03/08/2021   Decreased exercise tolerance 03/08/2021   Hyperparathyroidism ([place]) 02/09/2018   Abnormal prominence of clavicle 02/07/2018   Psoriasis 10/23/2017   Carpal tunnel syndrome 08/23/2015    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   H/O: gout 08/08/2015   Elevated blood sugar 08/08/2015   Essential hypertension 07/09/2014   Hyperlipidemia 07/09/2014   Body aches 07/09/2014   Urinary frequency 07/09/2014   Numbness and tingling in left hand 07/09/2014   Visit for preventive health examination 04/11/2013   Obesity (BMI 30-39.9) 04/11/2013   Statin intolerance 04/11/2013   At risk for coronary artery disease 05/15/2012   Family history of premature coronary heart disease 04/23/2012   Fatigue 12/29/2010   Nonspecific abnormal electrocardiogram (ECG) (EKG) 12/29/2010   Hyperuricemia 12/29/2010   Palpitations    OBESITY 01/10/2010   HYPERLIPIDEMIA 11/09/2009   Gout 11/09/2009   HYPERTENSION 11/09/2009   ARTHRITIS 11/09/2009   Annia Friendly, PT 06/01/21 2:37 PM   [facility] @ [place], [place], [address] Phone: [phone]   Fax:  [phone]  Name: Christy Young MRN: [id number] Date of Birth: [birth year]

## 2021-06-02 ENCOUNTER — Encounter (HOSPITAL_BASED_OUTPATIENT_CLINIC_OR_DEPARTMENT_OTHER): Payer: Self-pay | Admitting: Surgery

## 2021-06-02 NOTE — Progress Notes (Signed)
Chart reviewed by Dr. Royce Macadamia, anesthesiologist. Last cardiology visit reviewed. Due to findings, patient is not a candidate for surgery at [facility], per Dr. Royce Macadamia. Ivy, at [place] notified.

## 2021-06-03 ENCOUNTER — Encounter: Payer: Self-pay | Admitting: *Deleted

## 2021-06-04 ENCOUNTER — Other Ambulatory Visit: Payer: Self-pay | Admitting: Cardiology

## 2021-06-04 DIAGNOSIS — E782 Mixed hyperlipidemia: Secondary | ICD-10-CM

## 2021-06-06 ENCOUNTER — Encounter (HOSPITAL_COMMUNITY): Payer: Self-pay | Admitting: Surgery

## 2021-06-06 DIAGNOSIS — C50412 Malignant neoplasm of upper-outer quadrant of left female breast: Secondary | ICD-10-CM | POA: Diagnosis not present

## 2021-06-06 NOTE — Progress Notes (Signed)
Anesthesia Chart Review: SAME DAY WORK-UP  Case: [id number] Date/Time: 06/07/21 1115   Procedure: LEFT BREAST LUMPECTOMY WITH RADIOACTIVE SEED X2 AND AXILLARY SENTINEL LYMPH NODE BIOPSY (Left: Breast)   Anesthesia type: General   Pre-op diagnosis: LEFT INVASIVE DUCTAL CARCINOMA   Location: [facility] ROOM 02 / [facility]   Surgeons: Donnie Mesa, MD       DISCUSSION: Patient is a 75 year old female scheduled for the above procedure.  Chart forwarded for review on 06/06/21 as a same day work-up chart review. Notes indicate that her RSL was scheduled to be placed 06/06/21 at 11:45 AM. Appears she was moved from the [facility] to [facility] after staff reviewed history with anesthesiologist [facility] Igo, MD.  History includes never smoker, HTN, HLD, palpitations, exertional dyspnea, breast cancer ("invasive ductal carcinoma" left breast 04/2021), hysterectomy, primary hyperparathyroidism, cosmetic facial surgery (1980), obesity. .   Last cardiology visit with Dr. Earnie Larsson was on 04/29/21 for follow-up DOE. Initially seen 02/2021 for DOE and concern for chronic LLE pain. No chest pain. DOE felt likely related to obesity and deconditioning. Low suspicion for ischemia. Low suspicion for LLE DVT. Echo and LLE [place] ordered and done 03/2021. No LLE DVT noted. Echo showed severe asymmetric LVH with septum measuring 1.6 cm, normal global wall motion, LVEF 16%, grade 1 diastolic dysfunction, normal LAP, moderate AI, mild to moderate MR/TR, estimated PASP 31 mmHg. No LVOT obstruction, so he held off on cardiac MRI. He prescribed Lasix 20 mg daily with plans six month follow-up with repeat echo 09/2021. Tolerating Crestor 20 mg for HLD (had myalgias with Lipitor).  Anesthesia team to evaluate on the day of surgery.   VS: Ht 5\' 4"  (1.626 m)   Wt 95.7 kg   BMI 36.22 kg/m  BP Readings from Last 3 Encounters:  06/01/21 131/68  05/10/21 135/87  05/03/21 138/79   Pulse Readings from Last 3 Encounters:  06/01/21 95   05/10/21 76  05/03/21 87     PROVIDERS: [name], Standley Brooking, MD is PCP  Vernell Leep, MD is cardiologist Renato Shin, MD is endocrinologist. As of 03/18/21, parathyroid does not need surgery.  Eppie Gibson, MD is RAD-ONC Truitt Merle, MD is HEM-ONC   LABS: Lab results as of 06/01/21 include: Lab Results  Component Value Date   WBC 8.2 06/01/2021   HGB 13.5 06/01/2021   HCT 40.3 06/01/2021   PLT 276 06/01/2021   GLUCOSE 111 (H) 06/01/2021   ALT 34 06/01/2021   AST 17 06/01/2021   NA 144 06/01/2021   K 3.9 06/01/2021   CL 110 06/01/2021   CREATININE 0.86 06/01/2021   BUN 18 06/01/2021   CO2 23 06/01/2021   TSH 0.34 (L) 02/16/2021   HGBA1C 5.9 02/16/2021      IMAGES: CXR 02/21/21: FINDINGS: - Trachea is midline accounting for mild rotation on the current image. - Mild ectasia of the aorta is suggested though previous clavicular images show similar appearance to the superior mediastinal contour. - Heart size is enlarged.  Hilar structures are unremarkable. - Bandlike opacity in the LEFT mid chest. No lobar consolidative process. No sign of pleural effusion. - On limited assessment no acute skeletal process. IMPRESSION: - Bandlike opacity in the LEFT mid chest, likely atelectasis or scarring. - Cardiomegaly. - No evidence of acute cardiopulmonary disease.   EKG 03/16/2021: Sinus rhythm 83 bpm Left atrial enlargement.  Poor R-wave progression -nonspecific    CV: Echocardiogram 04/01/2021:  Left ventricle cavity is normal in size. Severe asymmetric  hypertrophy of  the left ventricle, with septum measuring 1.6 cm. Normal global wall  motion. Normal LV systolic function with EF 57%. Doppler evidence of grade  I (impaired) diastolic dysfunction, normal LAP.  Trileaflet aortic valve.  Moderate (Grade II) aortic regurgitation.  Mild to moderate mitral regurgitation.  Mild to moderate tricuspid regurgitation. Estimated pulmonary artery  systolic pressure 31 mmHg.     Left Lower Extremity Venous Duplex 04/01/2021:  No evidence of deep vein thrombosis of the left lower extremity with  normal venous return. Right CFV not imaged.   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    allopurinol (ZYLOPRIM) 100 MG tablet   furosemide (LASIX) 20 MG tablet   Multiple Vitamins-Minerals (MULTIVITAMIN WITH MINERALS) tablet   nebivolol (BYSTOLIC) 5 MG tablet   Vitamin D, Ergocalciferol, (DRISDOL) 1.25 MG (50000 UNIT) CAPS capsule   atorvastatin (LIPITOR) 40 MG tablet    Myra Gianotti, PA-C Surgical Short Stay/Anesthesiology [facility] Phone [phone] [facility] Phone [phone] 06/06/2021 3:21 PM

## 2021-06-06 NOTE — Anesthesia Preprocedure Evaluation (Addendum)
Anesthesia Evaluation  Patient identified by MRN, date of birth, ID band Patient awake    Reviewed: Allergy & Precautions, NPO status , Patient's Chart, lab work & pertinent test results, reviewed documented beta blocker date and time   History of Anesthesia Complications Negative for: history of anesthetic complications  Airway Mallampati: III  TM Distance: >3 FB Neck ROM: Full    Dental  (+) Dental Advisory Given, Loose, Missing,    Pulmonary neg pulmonary ROS,    breath sounds clear to auscultation       Cardiovascular hypertension, Pt. on medications and Pt. on home beta blockers + Valvular Problems/Murmurs AI and MR  Rhythm:Regular + Systolic murmurs Echocardiogram 04/01/2021:  Left ventricle cavity is normal in size. 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 Normal global wall  motion. Normal LV systolic function with EF 57%. Doppler evidence of grade  I (impaired) diastolic dysfunction, normal LAP.  Trileaflet aortic valve. Moderate (Grade II) aortic regurgitation.  Mild to moderate mitral regurgitation.  Mild to moderate tricuspid regurgitation. Estimated pulmonary artery  systolic pressure 31 mmHg.    Neuro/Psych  Neuromuscular disease negative psych ROS   GI/Hepatic negative GI ROS, Neg liver ROS,   Endo/Other  negative endocrine ROS  Renal/GU Lab Results      Component                Value               Date                      CREATININE               0.86                06/01/2021                Musculoskeletal negative musculoskeletal ROS (+)   Abdominal   Peds  Hematology negative hematology ROS (+) Lab Results      Component                Value               Date                      WBC                      8.2                 06/01/2021                HGB                      13.5                06/01/2021                HCT                      40.3                 06/01/2021                MCV                      88.6  06/01/2021                PLT                      276                 06/01/2021              Anesthesia Other Findings   Reproductive/Obstetrics                          Anesthesia Physical Anesthesia Plan  ASA: 2  Anesthesia Plan: General and Regional   Post-op Pain Management: Regional block and Tylenol PO (pre-op)   Induction: Intravenous  PONV Risk Score and Plan: 3 and Ondansetron, Dexamethasone, Propofol infusion and TIVA  Airway Management Planned: LMA  Additional Equipment: None  Intra-op Plan:   Post-operative Plan: Extubation in OR  Informed Consent: I have reviewed the patients History and Physical, chart, labs and discussed the procedure including the risks, benefits and alternatives for the proposed anesthesia with the patient or authorized representative who has indicated his/her understanding and acceptance.     Dental advisory given  Plan Discussed with: CRNA and Anesthesiologist  Anesthesia Plan Comments: (PAT note written 06/06/2021 by Myra Gianotti, PA-C. )      Anesthesia Quick Evaluation

## 2021-06-06 NOTE — Progress Notes (Signed)
DUE TO COVID-19 ONLY ONE VISITOR IS ALLOWED TO COME WITH YOU AND STAY IN THE WAITING ROOM ONLY DURING PRE OP AND PROCEDURE DAY OF SURGERY.   PCP - Dr Shanon Ace Cardiologist - Dr Vernell Leep Internal Med - Dr Renato Shin  Chest x-ray - 02/21/21 (2V) EKG - 03/16/21 Stress Test - 2005 (Neg) PCV ECHO - 04/01/21 Cardiac Cath - n/a  ICD Pacemaker/Loop - n/a  Sleep Study -  n/a CPAP - none  ERAS: Clear liquids til 8:30 AM DOS  Anesthesia review: Yes  STOP now taking any Aspirin (unless otherwise instructed by your surgeon), Aleve, Naproxen, Ibuprofen, Motrin, Advil, Goody's, BC's, all herbal medications, fish oil, and all vitamins.   Coronavirus Screening Covid test n/a Ambulatory Surgery  Do you have any of the following symptoms:  Cough Yes-Sinus  Fever (>100.54F)  yes/no: No Runny nose yes/no: No Sore throat yes/no: No Difficulty breathing/shortness of breath  yes/no: No  Have you traveled in the last 14 days and where? yes/no: No  Patient verbalized understanding of instructions that were given via phone.

## 2021-06-06 NOTE — Telephone Encounter (Signed)
Pharmacy requesting rosuvastatin get switched to atorvastatin per Pts request

## 2021-06-07 ENCOUNTER — Ambulatory Visit (HOSPITAL_COMMUNITY)
Admission: RE | Admit: 2021-06-07 | Discharge: 2021-06-07 | Disposition: A | Discharge status: Home / Self Care | Payer: Medicare Other | Source: Ambulatory Visit | Attending: Surgery | Admitting: Surgery

## 2021-06-07 ENCOUNTER — Ambulatory Visit (HOSPITAL_COMMUNITY): Payer: Medicare Other | Admitting: Vascular Surgery

## 2021-06-07 ENCOUNTER — Encounter: Payer: Self-pay | Admitting: *Deleted

## 2021-06-07 ENCOUNTER — Ambulatory Visit (HOSPITAL_BASED_OUTPATIENT_CLINIC_OR_DEPARTMENT_OTHER)
Admission: RE | Admit: 2021-06-07 | Discharge: 2021-06-07 | Disposition: A | Discharge status: Home / Self Care | Payer: Medicare Other | Attending: Surgery | Admitting: Surgery

## 2021-06-07 ENCOUNTER — Encounter (HOSPITAL_COMMUNITY): Payer: Self-pay | Admitting: Surgery

## 2021-06-07 ENCOUNTER — Encounter (HOSPITAL_COMMUNITY)
Admission: RE | Disposition: A | Discharge status: Home / Self Care | Payer: Self-pay | Source: Home / Self Care | Attending: Surgery

## 2021-06-07 ENCOUNTER — Other Ambulatory Visit: Payer: Self-pay

## 2021-06-07 DIAGNOSIS — C50912 Malignant neoplasm of unspecified site of left female breast: Secondary | ICD-10-CM

## 2021-06-07 DIAGNOSIS — R002 Palpitations: Secondary | ICD-10-CM | POA: Insufficient documentation

## 2021-06-07 DIAGNOSIS — Z170 Estrogen receptor positive status [ER+]: Secondary | ICD-10-CM | POA: Diagnosis not present

## 2021-06-07 DIAGNOSIS — Z6836 Body mass index (BMI) 36.0-36.9, adult: Secondary | ICD-10-CM | POA: Diagnosis not present

## 2021-06-07 DIAGNOSIS — E785 Hyperlipidemia, unspecified: Secondary | ICD-10-CM | POA: Diagnosis not present

## 2021-06-07 DIAGNOSIS — D0512 Intraductal carcinoma in situ of left breast: Secondary | ICD-10-CM | POA: Diagnosis not present

## 2021-06-07 DIAGNOSIS — E669 Obesity, unspecified: Secondary | ICD-10-CM | POA: Diagnosis not present

## 2021-06-07 DIAGNOSIS — I080 Rheumatic disorders of both mitral and aortic valves: Secondary | ICD-10-CM | POA: Insufficient documentation

## 2021-06-07 DIAGNOSIS — I10 Essential (primary) hypertension: Secondary | ICD-10-CM | POA: Insufficient documentation

## 2021-06-07 DIAGNOSIS — R0609 Other forms of dyspnea: Secondary | ICD-10-CM | POA: Diagnosis not present

## 2021-06-07 DIAGNOSIS — E213 Hyperparathyroidism, unspecified: Secondary | ICD-10-CM | POA: Diagnosis not present

## 2021-06-07 DIAGNOSIS — C50412 Malignant neoplasm of upper-outer quadrant of left female breast: Secondary | ICD-10-CM | POA: Diagnosis not present

## 2021-06-07 DIAGNOSIS — Z90710 Acquired absence of both cervix and uterus: Secondary | ICD-10-CM | POA: Insufficient documentation

## 2021-06-07 HISTORY — PX: BREAST LUMPECTOMY WITH RADIOACTIVE SEED AND SENTINEL LYMPH NODE BIOPSY: SHX6550

## 2021-06-07 HISTORY — DX: Primary hyperparathyroidism: E21.0

## 2021-06-07 HISTORY — DX: Other forms of dyspnea: R06.09

## 2021-06-07 SURGERY — BREAST LUMPECTOMY WITH RADIOACTIVE SEED AND SENTINEL LYMPH NODE BIOPSY
Anesthesia: Regional | Site: Breast | Laterality: Left

## 2021-06-07 MED ORDER — ORAL CARE MOUTH RINSE: 15.0000 mL | Freq: Once | OROMUCOSAL | Status: AC

## 2021-06-07 MED ORDER — ACETAMINOPHEN 500 MG PO TABS
1000.0000 mg | ORAL_TABLET | ORAL | Status: AC
  Administered 2021-06-07: 1000 mg via ORAL
  Filled 2021-06-07: qty 2

## 2021-06-07 MED ORDER — FENTANYL CITRATE (PF) 100 MCG/2ML IJ SOLN
INTRAMUSCULAR | Status: AC
  Administered 2021-06-07: 50 ug via INTRAVENOUS
  Filled 2021-06-07: qty 2

## 2021-06-07 MED ORDER — CHLORHEXIDINE GLUCONATE CLOTH 2 % EX PADS: 6.0000 | MEDICATED_PAD | Freq: Once | CUTANEOUS | Status: DC

## 2021-06-07 MED ORDER — BUPIVACAINE-EPINEPHRINE 0.25% -1:200000 IJ SOLN
INTRAMUSCULAR | Status: DC | PRN
  Administered 2021-06-07: 20 mL

## 2021-06-07 MED ORDER — MIDAZOLAM HCL 2 MG/2ML IJ SOLN: 1.0000 mg | Freq: Once | INTRAMUSCULAR | Status: AC

## 2021-06-07 MED ORDER — MIDAZOLAM HCL 2 MG/2ML IJ SOLN
INTRAMUSCULAR | Status: AC
  Administered 2021-06-07: 1 mg via INTRAVENOUS
  Filled 2021-06-07: qty 2

## 2021-06-07 MED ORDER — LACTATED RINGERS IV SOLN: INTRAVENOUS | Status: DC

## 2021-06-07 MED ORDER — PROPOFOL 500 MG/50ML IV EMUL
INTRAVENOUS | Status: DC | PRN
  Administered 2021-06-07: 150 ug/kg/min via INTRAVENOUS

## 2021-06-07 MED ORDER — PROPOFOL 10 MG/ML IV BOLUS
INTRAVENOUS | Status: AC
  Filled 2021-06-07: qty 20

## 2021-06-07 MED ORDER — ACETAMINOPHEN 160 MG/5ML PO SOLN: 1000.0000 mg | Freq: Once | ORAL | Status: DC | PRN

## 2021-06-07 MED ORDER — FENTANYL CITRATE (PF) 100 MCG/2ML IJ SOLN: 50.0000 ug | Freq: Once | INTRAMUSCULAR | Status: AC

## 2021-06-07 MED ORDER — SODIUM CHLORIDE (PF) 0.9 % IJ SOLN
INTRAMUSCULAR | Status: AC
  Filled 2021-06-07: qty 10

## 2021-06-07 MED ORDER — ACETAMINOPHEN 10 MG/ML IV SOLN: 1000.0000 mg | Freq: Once | INTRAVENOUS | Status: DC | PRN

## 2021-06-07 MED ORDER — FENTANYL CITRATE (PF) 250 MCG/5ML IJ SOLN
INTRAMUSCULAR | Status: DC | PRN
  Administered 2021-06-07 (×2): 50 ug via INTRAVENOUS

## 2021-06-07 MED ORDER — DEXAMETHASONE SODIUM PHOSPHATE 10 MG/ML IJ SOLN
INTRAMUSCULAR | Status: DC | PRN
  Administered 2021-06-07: 10 mg via INTRAVENOUS

## 2021-06-07 MED ORDER — METHYLENE BLUE 0.5 % INJ SOLN
INTRAVENOUS | Status: AC
  Filled 2021-06-07: qty 10

## 2021-06-07 MED ORDER — TECHNETIUM TC 99M TILMANOCEPT KIT
1.0000 | PACK | Freq: Once | INTRAVENOUS | Status: AC | PRN
  Administered 2021-06-07: 1 via INTRADERMAL

## 2021-06-07 MED ORDER — PHENYLEPHRINE 40 MCG/ML (10ML) SYRINGE FOR IV PUSH (FOR BLOOD PRESSURE SUPPORT)
PREFILLED_SYRINGE | INTRAVENOUS | Status: DC | PRN
  Administered 2021-06-07: 80 ug via INTRAVENOUS

## 2021-06-07 MED ORDER — MAGTRACE LYMPHATIC TRACER
INTRAMUSCULAR | Status: DC | PRN
  Administered 2021-06-07: 2 mL via INTRAMUSCULAR

## 2021-06-07 MED ORDER — ACETAMINOPHEN 500 MG PO TABS: 1000.0000 mg | ORAL_TABLET | Freq: Once | ORAL | Status: DC | PRN

## 2021-06-07 MED ORDER — CHLORHEXIDINE GLUCONATE 0.12 % MT SOLN
15.0000 mL | Freq: Once | OROMUCOSAL | Status: AC
  Administered 2021-06-07: 15 mL via OROMUCOSAL
  Filled 2021-06-07: qty 15

## 2021-06-07 MED ORDER — 0.9 % SODIUM CHLORIDE (POUR BTL) OPTIME
TOPICAL | Status: DC | PRN
  Administered 2021-06-07: 1000 mL

## 2021-06-07 MED ORDER — HEMOSTATIC AGENTS (NO CHARGE) OPTIME
TOPICAL | Status: DC | PRN
  Administered 2021-06-07: 1 via TOPICAL

## 2021-06-07 MED ORDER — FENTANYL CITRATE (PF) 100 MCG/2ML IJ SOLN: 25.0000 ug | INTRAMUSCULAR | Status: DC | PRN

## 2021-06-07 MED ORDER — OXYCODONE HCL 5 MG PO TABS: 5.0000 mg | ORAL_TABLET | Freq: Once | ORAL | Status: DC | PRN

## 2021-06-07 MED ORDER — CEFAZOLIN SODIUM-DEXTROSE 2-4 GM/100ML-% IV SOLN
2.0000 g | INTRAVENOUS | Status: AC
  Administered 2021-06-07: 2 g via INTRAVENOUS
  Filled 2021-06-07: qty 100

## 2021-06-07 MED ORDER — HYDROCODONE-ACETAMINOPHEN 5-325 MG PO TABS: 1.0000 | ORAL_TABLET | Freq: Four times a day (QID) | ORAL | 0 refills | Status: DC | PRN

## 2021-06-07 MED ORDER — ONDANSETRON HCL 4 MG/2ML IJ SOLN
INTRAMUSCULAR | Status: DC | PRN
  Administered 2021-06-07: 4 mg via INTRAVENOUS

## 2021-06-07 MED ORDER — BUPIVACAINE-EPINEPHRINE (PF) 0.25% -1:200000 IJ SOLN
INTRAMUSCULAR | Status: AC
  Filled 2021-06-07: qty 30

## 2021-06-07 MED ORDER — FENTANYL CITRATE (PF) 250 MCG/5ML IJ SOLN
INTRAMUSCULAR | Status: AC
  Filled 2021-06-07: qty 5

## 2021-06-07 MED ORDER — OXYCODONE HCL 5 MG/5ML PO SOLN: 5.0000 mg | Freq: Once | ORAL | Status: DC | PRN

## 2021-06-07 MED ORDER — PROPOFOL 10 MG/ML IV BOLUS
INTRAVENOUS | Status: DC | PRN
  Administered 2021-06-07: 160 mg via INTRAVENOUS

## 2021-06-07 SURGICAL SUPPLY — 62 items
APPLIER CLIP 9.375 MED OPEN (MISCELLANEOUS) ×2
BENZOIN TINCTURE PRP APPL 2/3 (GAUZE/BANDAGES/DRESSINGS) ×3 IMPLANT
BINDER BREAST 3XL (GAUZE/BANDAGES/DRESSINGS) IMPLANT
BINDER BREAST LRG (GAUZE/BANDAGES/DRESSINGS) IMPLANT
BINDER BREAST MEDIUM (GAUZE/BANDAGES/DRESSINGS) IMPLANT
BINDER BREAST XLRG (GAUZE/BANDAGES/DRESSINGS) IMPLANT
BINDER BREAST XXLRG (GAUZE/BANDAGES/DRESSINGS) IMPLANT
BLADE CLIPPER SURG (BLADE) IMPLANT
BLADE HEX COATED 2.75 (ELECTRODE) ×1 IMPLANT
BLADE SURG 10 STRL SS (BLADE) IMPLANT
BLADE SURG 15 STRL LF DISP TIS (BLADE) ×1 IMPLANT
BLADE SURG 15 STRL SS (BLADE) ×1
CANISTER SUC SOCK COL 7IN (MISCELLANEOUS) ×2 IMPLANT
CANISTER SUCT 1200ML W/VALVE (MISCELLANEOUS) IMPLANT
CHLORAPREP W/TINT 26 (MISCELLANEOUS) ×2 IMPLANT
CLIP APPLIE 9.375 MED OPEN (MISCELLANEOUS) IMPLANT
CLSR STERI-STRIP ANTIMIC 1/2X4 (GAUZE/BANDAGES/DRESSINGS) ×2 IMPLANT
COVER BACK TABLE 60X90IN (DRAPES) ×2 IMPLANT
COVER MAYO STAND STRL (DRAPES) ×2 IMPLANT
COVER PROBE W GEL 5X96 (DRAPES) ×2 IMPLANT
DECANTER SPIKE VIAL GLASS SM (MISCELLANEOUS) IMPLANT
DRAPE CHEST BREAST 15X10 FENES (DRAPES) ×1 IMPLANT
DRAPE LAPAROSCOPIC ABDOMINAL (DRAPES) ×1 IMPLANT
DRAPE UTILITY XL STRL (DRAPES) ×1 IMPLANT
DRSG TEGADERM 4X4.75 (GAUZE/BANDAGES/DRESSINGS) ×4 IMPLANT
ELECT COATED BLADE 2.86 ST (ELECTRODE) ×1 IMPLANT
ELECT REM PT RETURN 9FT ADLT (ELECTROSURGICAL) ×2
ELECTRODE REM PT RTRN 9FT ADLT (ELECTROSURGICAL) ×1 IMPLANT
GAUZE SPONGE 4X4 12PLY STRL (GAUZE/BANDAGES/DRESSINGS) ×2 IMPLANT
GAUZE SPONGE 4X4 12PLY STRL LF (GAUZE/BANDAGES/DRESSINGS) IMPLANT
GLOVE SURG ENC MOIS LTX SZ7 (GLOVE) ×1 IMPLANT
GLOVE SURG UNDER POLY LF SZ7.5 (GLOVE) ×2 IMPLANT
GOWN STRL REUS W/ TWL LRG LVL3 (GOWN DISPOSABLE) ×2 IMPLANT
GOWN STRL REUS W/TWL LRG LVL3 (GOWN DISPOSABLE) ×2
HEMOSTAT ARISTA ABSORB 3G PWDR (HEMOSTASIS) ×1 IMPLANT
ILLUMINATOR WAVEGUIDE N/F (MISCELLANEOUS) IMPLANT
KIT MARKER MARGIN INK (KITS) ×2 IMPLANT
LIGHT WAVEGUIDE WIDE FLAT (MISCELLANEOUS) IMPLANT
NDL HYPO 25X1 1.5 SAFETY (NEEDLE) ×2 IMPLANT
NDL SAFETY ECLIPSE 18X1.5 (NEEDLE) ×1 IMPLANT
NEEDLE HYPO 18GX1.5 SHARP (NEEDLE)
NEEDLE HYPO 25X1 1.5 SAFETY (NEEDLE) IMPLANT
NS IRRIG 1000ML POUR BTL (IV SOLUTION) IMPLANT
PACK BASIN DAY SURGERY FS (CUSTOM PROCEDURE TRAY) ×2 IMPLANT
PENCIL SMOKE EVACUATOR (MISCELLANEOUS) ×1 IMPLANT
SLEEVE SCD COMPRESS KNEE MED (STOCKING) ×2 IMPLANT
SPONGE T-LAP 18X18 ~~LOC~~+RFID (SPONGE) IMPLANT
SPONGE T-LAP 4X18 ~~LOC~~+RFID (SPONGE) ×3 IMPLANT
STRIP CLOSURE SKIN 1/2X4 (GAUZE/BANDAGES/DRESSINGS) ×2 IMPLANT
SUT MON AB 4-0 PC3 18 (SUTURE) ×3 IMPLANT
SUT SILK 2 0 SH (SUTURE) IMPLANT
SUT VIC AB 2-0 SH 27 (SUTURE) ×1
SUT VIC AB 2-0 SH 27XBRD (SUTURE) IMPLANT
SUT VIC AB 3-0 SH 27 (SUTURE) ×2
SUT VIC AB 3-0 SH 27X BRD (SUTURE) ×1 IMPLANT
SYR BULB EAR ULCER 3OZ GRN STR (SYRINGE) ×2 IMPLANT
SYR CONTROL 10ML LL (SYRINGE) ×4 IMPLANT
TOWEL GREEN STERILE FF (TOWEL DISPOSABLE) ×2 IMPLANT
TRACER MAGTRACE VIAL (MISCELLANEOUS) ×1 IMPLANT
TRAY FAXITRON CT DISP (TRAY / TRAY PROCEDURE) ×2 IMPLANT
TUBE CONNECTING 20X1/4 (TUBING) ×1 IMPLANT
YANKAUER SUCT BULB TIP NO VENT (SUCTIONS) IMPLANT

## 2021-06-07 NOTE — Interval H&P Note (Signed)
History and Physical Interval Note:  06/07/2021 9:16 AM  Christy Young  has presented today for surgery, with the diagnosis of LEFT INVASIVE DUCTAL CARCINOMA.  The various methods of treatment have been discussed with the patient and family. After consideration of risks, benefits and other options for treatment, the patient has consented to  Procedure(s): LEFT BREAST LUMPECTOMY WITH RADIOACTIVE SEED X2 AND AXILLARY SENTINEL LYMPH NODE BIOPSY (Left) as a surgical intervention.  The patient's history has been reviewed, patient examined, no change in status, stable for surgery.  I have reviewed the patient's chart and labs.  Questions were answered to the patient's satisfaction.     Maia Petties

## 2021-06-07 NOTE — Transfer of Care (Signed)
Immediate Anesthesia Transfer of Care Note  Patient: Christy Young  Procedure(s) Performed: LEFT BREAST LUMPECTOMY WITH RADIOACTIVE SEED X2 AND AXILLARY SENTINEL LYMPH NODE BIOPSY (Left: Breast)  Patient Location: PACU  Anesthesia Type:General  Level of Consciousness: awake, alert  and oriented  Airway & Oxygen Therapy: Patient Spontanous Breathing and Patient connected to nasal cannula oxygen  Post-op Assessment: Report given to RN and Post -op Vital signs reviewed and stable  Post vital signs: Reviewed and stable  Last Vitals:  Vitals Value Taken Time  BP 111/76 06/07/21 1245  Temp    Pulse 67 06/07/21 1247  Resp 18 06/07/21 1247  SpO2 93 % 06/07/21 1247  Vitals shown include unvalidated device data.  Last Pain:  Vitals:   06/07/21 0950  TempSrc:   PainSc: 0-No pain         Complications: No notable events documented.

## 2021-06-07 NOTE — Discharge Instructions (Signed)
[facility] Office Phone Number [phone]  BREAST BIOPSY/ PARTIAL MASTECTOMY: POST OP INSTRUCTIONS  Always review your discharge instruction sheet given to you by the facility where your surgery was performed.  IF YOU HAVE DISABILITY OR FAMILY LEAVE FORMS, YOU MUST BRING THEM TO THE OFFICE FOR PROCESSING.  DO NOT GIVE THEM TO YOUR DOCTOR.  A prescription for pain medication may be given to you upon discharge.  Take your pain medication as prescribed, if needed.  If narcotic pain medicine is not needed, then you may take acetaminophen (Tylenol) or ibuprofen (Advil) as needed. Take your usually prescribed medications unless otherwise directed If you need a refill on your pain medication, please contact your pharmacy.  They will contact our office to request authorization.  Prescriptions will not be filled after 5pm or on week-ends. You should eat very light the first 24 hours after surgery, such as soup, crackers, pudding, etc.  Resume your normal diet the day after surgery. Most patients will experience some swelling and bruising in the breast.  Ice packs and a good support bra will help.  Swelling and bruising can take several days to resolve.  It is common to experience some constipation if taking pain medication after surgery.  Increasing fluid intake and taking a stool softener will usually help or prevent this problem from occurring.  A mild laxative (Milk of Magnesia or Miralax) should be taken according to package directions if there are no bowel movements after 48 hours. Unless discharge instructions indicate otherwise, you may remove your bandages 24-48 hours after surgery, and you may shower at that time.  You may have steri-strips (small skin tapes) in place directly over the incision.  These strips should be left on the skin for 7-10 days.  If your surgeon used skin glue on the incision, you may shower in 24 hours.  The glue will flake off over the next 2-3 weeks.  Any  sutures or staples will be removed at the office during your follow-up visit. ACTIVITIES:  You may resume regular daily activities (gradually increasing) beginning the next day.  Wearing a good support bra or sports bra minimizes pain and swelling.  You may have sexual intercourse when it is comfortable. You may drive when you no longer are taking prescription pain medication, you can comfortably wear a seatbelt, and you can safely maneuver your car and apply brakes. RETURN TO WORK:  ______________________________________________________________________________________ You should see your doctor in the office for a follow-up appointment approximately two weeks after your surgery.  Your doctor's nurse will typically make your follow-up appointment when she calls you with your pathology report.  Expect your pathology report 2-3 business days after your surgery.  You may call to check if you do not hear from us after three days. OTHER INSTRUCTIONS: _______________________________________________________________________________________________ _____________________________________________________________________________________________________________________________________ _____________________________________________________________________________________________________________________________________ _____________________________________________________________________________________________________________________________________  WHEN TO CALL YOUR DOCTOR: Fever over 101.0 Nausea and/or vomiting. Extreme swelling or bruising. Continued bleeding from incision. Increased pain, redness, or drainage from the incision.  The clinic staff is available to answer your questions during regular business hours.  Please don't hesitate to call and ask to speak to one of the nurses for clinical concerns.  If you have a medical emergency, go to the nearest emergency room or call 911.  A surgeon from [facility] is always on call at the hospital.  For further questions, please visit [email or web address]   

## 2021-06-07 NOTE — Anesthesia Procedure Notes (Signed)
Procedure Name: LMA Insertion Date/Time: 06/07/2021 11:07 AM Performed by: Jenne Campus, CRNA Pre-anesthesia Checklist: Patient identified, Emergency Drugs available, Suction available and Patient being monitored Patient Re-evaluated:Patient Re-evaluated prior to induction Oxygen Delivery Method: Circle System Utilized Preoxygenation: Pre-oxygenation with 100% oxygen Induction Type: IV induction Ventilation: Mask ventilation without difficulty LMA: LMA inserted LMA Size: 4.0 Number of attempts: 1 Airway Equipment and Method: Bite block Placement Confirmation: positive ETCO2 and breath sounds checked- equal and bilateral Tube secured with: Tape Dental Injury: Teeth and Oropharynx as per pre-operative assessment

## 2021-06-07 NOTE — Op Note (Signed)
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 Anesthesia:  GEN - LMA/ PEC block Indications:  This is a 75 year old female who presented with approximately 1 month of a palpable mass in the left upper outer quadrant of her breast.  She has no family history of breast cancer.  She underwent work-up including diagnostic mammogram and ultrasound.  This showed a 2.4 x 1.8 x 2.3 cm mass in the left upper outer quadrant at approximately 2:00 measuring 13 cm from the nipple.  She was also noted to have a 3 mm mass anterior to the primary mass.  Both of these areas were biopsied.  These revealed invasive ductal carcinoma grade 3, ER/PR positive, HER2 negative, Ki-67 40%.  The 2 biopsy clips are approximately 4 cm apart.   She presents now for bracketed lumpectomy as well as sentinel lymph node biopsy Description of procedure:  Prior to surgery, the patient was injected in the preop area with radiotracer as well as mag trace.  2 seeds were placed yesterday by radiology to bracket the two areas of cancer.  The patient is brought to the operating room placed in supine position on the operating room table. After an adequate level of general anesthesia was obtained,  the breast was prepped with ChloraPrep and draped in sterile fashion. A timeout was taken to ensure the proper patient and proper procedure. We interrogated the breast with the neoprobe.  The 2 seeds are identified in the left upper outer quadrant.  We made a transverse incision between the 2 seeds after infiltrating with 0.25% Marcaine. Dissection was carried down in the breast tissue with cautery. We used the neoprobe to guide [place] towards the radioactive seeds. We excised an area of tissue around the radioactive seed 4 x 7 cm in diameter. The specimen was  removed and was oriented with a paint kit. Specimen mammogram showed the two radioactive seeds as well as both biopsy clips within the specimen. This was sent for pathologic examination. There is no residual radioactivity within the biopsy cavity. Clips were placed in all five margins.  We inspected carefully for hemostasis. The wound was thoroughly irrigated and sprayed with Arista powder.  We then turned our attention to the axilla.  The settings were adjusted on the Neoprobe and Sentimag probe and we interrogated the axilla.  I made a transverse incision over the area of activity.  We dissected into the axilla and identified a lymph node with the Magtrace.  Some brown dye was seen within the node.  This was dissected free and sent as "sentinel lymph node #1".  We confirmed the lymph node with the Neoprobe.  We interrogated the axilla again and there was minimal background activity with either probe.  The wounds were closed with a deep layer of 3-0 Vicryl and a subcuticular layer of 4-0 Monocryl. Benzoin and Steri-Strips were applied. The patient was then extubated and brought to the recovery room in stable condition. All sponge, instrument, and needle counts are correct.  Imogene Burn. Georgette Dover, MD, [facility]  General/ Trauma Surgery  05/17/2020 1:38 PM

## 2021-06-08 ENCOUNTER — Encounter (HOSPITAL_COMMUNITY): Payer: Self-pay | Admitting: Surgery

## 2021-06-09 LAB — SURGICAL PATHOLOGY

## 2021-06-10 NOTE — Anesthesia Postprocedure Evaluation (Signed)
Anesthesia Post Note  Patient: Christy Young  Procedure(s) Performed: LEFT BREAST LUMPECTOMY WITH RADIOACTIVE SEED X2 AND AXILLARY SENTINEL LYMPH NODE BIOPSY (Left: Breast)     Patient location during evaluation: PACU Anesthesia Type: Regional and General Level of consciousness: awake and alert Pain management: pain level controlled Vital Signs Assessment: post-procedure vital signs reviewed and stable Respiratory status: spontaneous breathing, nonlabored ventilation, respiratory function stable and patient connected to nasal cannula oxygen Cardiovascular status: blood pressure returned to baseline and stable Postop Assessment: no apparent nausea or vomiting Anesthetic complications: no   No notable events documented.  Last Vitals:  Vitals:   06/07/21 1300 06/07/21 1315  BP: 131/81 127/76  Pulse: 69 64  Resp: 14 13  Temp:  36.4 C  SpO2: 93% 96%    Last Pain:  Vitals:   06/07/21 1315  TempSrc:   PainSc: 0-No pain                 Audris Speaker

## 2021-06-12 ENCOUNTER — Other Ambulatory Visit: Payer: Self-pay | Admitting: Internal Medicine

## 2021-06-14 ENCOUNTER — Telehealth: Payer: Self-pay | Admitting: *Deleted

## 2021-06-14 ENCOUNTER — Encounter: Payer: Self-pay | Admitting: *Deleted

## 2021-06-14 NOTE — Telephone Encounter (Signed)
Received order for oncotype testing. Requisition faxed to pathology and [facility] °

## 2021-06-28 ENCOUNTER — Encounter (HOSPITAL_COMMUNITY): Payer: Self-pay

## 2021-06-28 ENCOUNTER — Telehealth: Payer: Self-pay | Admitting: *Deleted

## 2021-06-28 ENCOUNTER — Encounter: Payer: Self-pay | Admitting: *Deleted

## 2021-06-28 NOTE — Telephone Encounter (Signed)
Received oncotype results of 29/18%. Left voicemail for patient to call me back regarding results.

## 2021-06-29 ENCOUNTER — Other Ambulatory Visit: Payer: Self-pay

## 2021-06-29 ENCOUNTER — Telehealth: Payer: Self-pay | Admitting: *Deleted

## 2021-06-29 ENCOUNTER — Encounter: Payer: Self-pay | Admitting: Hematology

## 2021-06-29 ENCOUNTER — Inpatient Hospital Stay: Payer: Medicare Other | Attending: Hematology | Admitting: Hematology

## 2021-06-29 ENCOUNTER — Encounter: Payer: Self-pay | Admitting: *Deleted

## 2021-06-29 VITALS — BP 127/76 | HR 88 | Temp 98.7°F | Resp 18 | Ht 64.0 in | Wt 216.6 lb

## 2021-06-29 DIAGNOSIS — R630 Anorexia: Secondary | ICD-10-CM | POA: Diagnosis not present

## 2021-06-29 DIAGNOSIS — Z5111 Encounter for antineoplastic chemotherapy: Secondary | ICD-10-CM | POA: Insufficient documentation

## 2021-06-29 DIAGNOSIS — Z170 Estrogen receptor positive status [ER+]: Secondary | ICD-10-CM | POA: Insufficient documentation

## 2021-06-29 DIAGNOSIS — Z5189 Encounter for other specified aftercare: Secondary | ICD-10-CM | POA: Diagnosis not present

## 2021-06-29 DIAGNOSIS — C50412 Malignant neoplasm of upper-outer quadrant of left female breast: Secondary | ICD-10-CM | POA: Diagnosis not present

## 2021-06-29 DIAGNOSIS — R197 Diarrhea, unspecified: Secondary | ICD-10-CM | POA: Insufficient documentation

## 2021-06-29 MED ORDER — PROCHLORPERAZINE MALEATE 10 MG PO TABS: 10.0000 mg | ORAL_TABLET | Freq: Four times a day (QID) | ORAL | 1 refills | Status: DC | PRN

## 2021-06-29 MED ORDER — DEXAMETHASONE 4 MG PO TABS: 4.0000 mg | ORAL_TABLET | Freq: Two times a day (BID) | ORAL | 1 refills | Status: DC

## 2021-06-29 MED ORDER — ONDANSETRON HCL 8 MG PO TABS: 8.0000 mg | ORAL_TABLET | Freq: Two times a day (BID) | ORAL | 1 refills | Status: DC | PRN

## 2021-06-29 NOTE — Progress Notes (Signed)
[place]   Telephone:([phone] Fax:([phone]) [phone]   Clinic Follow up Note   Patient Care Team: [name], Standley Brooking, MD as PCP - General (Internal Medicine) Valinda Party, MD as Referring Physician (Rheumatology) Donnie Mesa, MD as Consulting Physician (General Surgery) Truitt Merle, MD as Consulting Physician (Hematology) Eppie Gibson, MD as Attending Physician (Radiation Oncology) Mauro Kaufmann, RN as Oncology Nurse Navigator Rockwell Germany, RN as Oncology Nurse Navigator Magnus Sinning, MD as Consulting Physician (Physical Medicine and Rehabilitation) Nigel Mormon, MD as Consulting Physician (Cardiology) Renato Shin, MD as Consulting Physician (Endocrinology)  Date of Service:  06/29/2021  CHIEF COMPLAINT: f/u of left breast cancer  CURRENT THERAPY:  PENDING Adjuvant chemo TC every 3 weeksX  ASSESSMENT & PLAN:  Christy Young is a 76 y.o. female with   1. 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 B/l diagnostic MM and left [place] on 05/02/21 showed two masses in left breast at 1 o'clock, 2.3 cm at 13 cmfn and 0.9 cm at 10 cmfn. Biopsy on 05/23/21 confirmed invasive ductal carcinoma to both sites. -she proceeded to left lumpectomy on 06/07/21 by Dr. Georgette Dover. Pathology showed grade 3 IDC to both tumors (2.5 cm and 0.8 cm) with small foci of DCIS. Margins and lymph node were negative. -Oncotype Dx test showed a recurrence score of 29, indicating a risk of recurrence outside the breast over the next 9 years of 18%, if the patient's only systemic therapy is an antiestrogen for 5 years.  It also predicts a significant benefit from chemotherapy. -I reviewed the oncotype results with her today. Her score indicates she is at high risk for recurrence, so the recommendation is adjuvant chemotherapy. I explained that her score  is on the low end of high risk (the cutoff being a score of 26). However, she is healthy overall, so I feel she would tolerate treatment. I recommend chemo with docetaxel and cyclophosphamide every 3 weeks for 4 cycles. --Chemotherapy consent: Side effects including but does not not limited to, fatigue, nausea, vomiting, diarrhea, hair loss (including small risk of permanent hair loss), neuropathy, fluid retention, renal and kidney dysfunction, neutropenic fever, needed for blood transfusion, bleeding, neuropathy, were discussed with patient in great detail. She agrees to proceed. -The goal of chemotherapy is curative. -we will plan to start chemo in about 2 weeks. -no plan to have a port    2. Bone Health  -Her most recent DEXA was 06/08/20 showing -0.8 (normal) -I previously explained that AI can decrease bone density.     PLAN:  -chemo education class next week -plan to begin chemo TCin ~2 weeks  -she prefers early morning -I will call in Zofran, Compazine, dexamethasone, and see her back with the first treatment.   No problem-specific Assessment & Plan notes found for this encounter.   SUMMARY OF ONCOLOGIC HISTORY: Oncology History Overview Note   Cancer Staging  Malignant neoplasm of upper-outer quadrant of left breast in female, estrogen receptor positive ([place]) Staging form: Breast, AJCC 8th Edition - Clinical stage from 06/01/2021: Stage IIA (cT2, cN0, cM0, G3, ER+, PR+, HER2-) - Unsigned      Malignant neoplasm of upper-outer quadrant of left breast in female, estrogen receptor positive ([place])  05/02/2021 Mammogram   Exam: 3D Mammogram Diagnostic Bilateral IMPRESSION: Palpable 2.4 cm irregular high density mass in the upper-outer left breast.  Exam:  Breast Ultrasound - Left IMPRESSION: 1. 2.3 cm irregular mass in left breast at 1 o'clock posterior depth, 13 mc from nipple. 2. 0.9 cm irregular mass in left breast at 1 o'clock posterior depth, 10 cm from nipple 3. Three  small 3-4 mm round masses favored to represent small cysts seen at 2:30-3:00 11-12 cm from nipple. 4. No abnormal nodes in left axilla.   05/23/2021 Initial Biopsy   Diagnosis 1. Breast, left, needle core biopsy, breast mass, 2.3 cm @ 1:00 13 CMFN  - INVASIVE DUCTAL CARCINOMA, grade 3  2. Breast, left, needle core biopsy, breast mass, 0.9 cm @ 1:00 10 CMFN  - INVASIVE DUCTAL CARCINOMA, grade 1  1. PROGNOSTIC INDICATORS Results: The tumor cells are EQUIVOCAL for Her2 (2+) Estrogen Receptor: 100%, POSITIVE, STRONG STAINING INTENSITY Progesterone Receptor: 60%, POSITIVE, STRONG STAINING INTENSITY Proliferation Marker Ki67: 40%  1. FLUORESCENCE IN-SITU HYBRIDIZATION Results: GROUP 5: HER2 **NEGATIVE**  2. PROGNOSTIC INDICATORS Results: The tumor cells are NEGATIVE for Her2 (1+) Estrogen Receptor: 100%, POSITIVE, STRONG STAINING INTENSITY Progesterone Receptor: 100%, POSITIVE, STRONG STAINING INTENSITY Proliferation Marker Ki67: 10%   05/30/2021 Initial Diagnosis   Malignant neoplasm of upper-outer quadrant of left breast in female, estrogen receptor positive ([place])   06/07/2021 Definitive Surgery   FINAL MICROSCOPIC DIAGNOSIS:   A. BREAST, LEFT, LUMPECTOMY:  Invasive ductal carcinomas (two independent lesions, 2.5 cm and 0.8 cm) with clear margins of resection.  Please see the synoptic report after specimen B.   B. LYMPH NODE, LEFT AXILLARY #1, SENTINEL, EXCISION:  Hyperplastic lymph node with fatty infiltration which is negative for  metastatic carcinoma.    06/07/2021 Oncotype testing   Oncotype DX was obtained on the final surgical sample and the recurrence score of 28 predicts a risk of recurrence outside the breast over the next 9 years of 18%, if the patient's only systemic therapy is an antiestrogen for 5 years.  It also predicts a significant benefit from chemotherapy.       INTERVAL HISTORY:  Christy Young is here for a follow up of breast cancer. She was last seen  by me on 06/01/21 in consultation. She presents to the clinic alone. She reports she is recovering well from surgery.   All other systems were reviewed with the patient and are negative.  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  ALLERGIES:  is allergic to pravastatin, rosuvastatin, and penicillins.  MEDICATIONS:  Current Outpatient Medications  Medication Sig Dispense Refill   allopurinol (ZYLOPRIM) 100 MG tablet TAKE 2 TABLETS BY MOUTH EVERY DAY (Patient taking differently: Take 100 mg by mouth 2 (two) times daily.) 180 tablet 0   atorvastatin (LIPITOR) 40 MG tablet Take 1 tablet (40 mg total) by mouth daily. 90 tablet 3   furosemide (LASIX) 20 MG tablet Take 1 tablet (20 mg total) by mouth daily. 60 tablet 3   HYDROcodone-acetaminophen (NORCO/VICODIN) 5-325 MG tablet Take 1 tablet by mouth every 6 (six) hours as needed for moderate pain. 15  tablet 0   Multiple Vitamins-Minerals (MULTIVITAMIN WITH MINERALS) tablet Take 1 tablet by mouth daily.     nebivolol (BYSTOLIC) 5 MG tablet TAKE 1 TABLET DAILY 90 tablet 0   Vitamin D, Ergocalciferol, (DRISDOL) 1.25 MG (50000 UNIT) CAPS capsule Take 1 capsule (50,000 Units total) by mouth every 7 (seven) days. 12 capsule 0   No current facility-administered medications for this visit.    PHYSICAL EXAMINATION: ECOG PERFORMANCE STATUS: 0  Vitals:   06/29/21 1331  BP: 127/76  Pulse: 88  Resp: 18  Temp: 98.7 F (37.1 C)  SpO2: 94%   Wt Readings from Last 3 Encounters:  06/29/21 216 lb 9.6 oz (98.2 kg)  06/07/21 211 lb (95.7 kg)  06/01/21 211 lb 4.8 oz (95.8 kg)     GENERAL:alert, no distress and comfortable SKIN: skin color normal, no rashes or significant lesions EYES: normal, Conjunctiva are pink and non-injected, sclera clear  NEURO: alert & oriented x 3 with fluent speech  LABORATORY DATA:  I have reviewed the data as listed CBC Latest Ref Rng & Units 06/01/2021 02/16/2021 02/18/2020  WBC 4.0 - 10.5 K/uL 8.2 11.4(H) 6.1  Hemoglobin 12.0 - 15.0 g/dL 13.5 13.4 13.5  Hematocrit 36.0 - 46.0 % 40.3 40.4 40.6  Platelets 150 - 400 K/uL 276 285.0 273     CMP Latest Ref Rng & Units 06/01/2021 03/18/2021 02/16/2021  Glucose 70 - 99 mg/dL 111(H) - 89  BUN 8 - 23 mg/dL 18 - 16  Creatinine 0.44 - 1.00 mg/dL 0.86 - 0.79  Sodium 135 - 145 mmol/L 144 - 143  Potassium 3.5 - 5.1 mmol/L 3.9 - 3.9  Chloride 98 - 111 mmol/L 110 - 106  CO2 22 - 32 mmol/L 23 - 26  Calcium 8.9 - 10.3 mg/dL 10.9(H) 10.9(H) 11.2(H)  Total Protein 6.5 - 8.1 g/dL 7.1 - 6.5  Total Bilirubin 0.3 - 1.2 mg/dL 0.9 - 0.6  Alkaline Phos 38 - 126 U/L 64 - 62  AST 15 - 41 U/L 17 - 18  ALT 0 - 44 U/L 34 - 23      RADIOGRAPHIC STUDIES: I have personally reviewed the radiological images as listed and agreed with the findings in the report. No results found.    No orders of the defined types were placed in this  encounter.  All questions were answered. The patient knows to call the clinic with any problems, questions or concerns. No barriers to learning was detected. The total time spent in the appointment was 40 minutes.     Truitt Merle, MD 06/29/2021   I, Wilburn Mylar, am acting as scribe for Truitt Merle, MD.   I have reviewed the above documentation for accuracy and completeness, and I agree with the above.

## 2021-06-29 NOTE — Telephone Encounter (Signed)
Received call back from patient and scheduled appt to see Dr. Burr Medico to discuss oncotype results 1/4 at 1:20pm.

## 2021-06-29 NOTE — Progress Notes (Signed)
START ON PATHWAY REGIMEN - Breast     A cycle is every 21 days:     Docetaxel      Cyclophosphamide   **Always confirm dose/schedule in your pharmacy ordering system**  Patient Characteristics: Postoperative without Neoadjuvant Therapy (Pathologic Staging), Invasive Disease, Adjuvant Therapy, HER2 Negative/Unknown/Equivocal, ER Positive, Node Negative, pT1a-c, pN0/N82m or pT2 or Higher, pN0, Oncotype High Risk (? 26) Therapeutic Status: Postoperative without Neoadjuvant Therapy (Pathologic Staging) AJCC Grade: G2 AJCC N Category: pN0 AJCC M Category: cM0 ER Status: Positive (+) AJCC 8 Stage Grouping: IA HER2 Status: Negative (-) Oncotype Dx Recurrence Score: 29 AJCC T Category: pT2 PR Status: Positive (+) Adjuvant Therapy Status: No Adjuvant Therapy Received Yet or Changing Initial Adjuvant Regimen due to Tolerance Has this patient completed genomic testing<= Yes - Oncotype DX(R) Intent of Therapy: Curative Intent, Discussed with Patient

## 2021-07-01 ENCOUNTER — Inpatient Hospital Stay: Payer: Medicare Other

## 2021-07-01 ENCOUNTER — Other Ambulatory Visit: Payer: Self-pay

## 2021-07-02 ENCOUNTER — Other Ambulatory Visit: Payer: Self-pay | Admitting: Internal Medicine

## 2021-07-04 ENCOUNTER — Encounter: Payer: Self-pay | Admitting: Physical Therapy

## 2021-07-04 ENCOUNTER — Ambulatory Visit: Payer: Medicare Other | Attending: Surgery | Admitting: Physical Therapy

## 2021-07-04 ENCOUNTER — Other Ambulatory Visit: Payer: Self-pay

## 2021-07-04 DIAGNOSIS — C50412 Malignant neoplasm of upper-outer quadrant of left female breast: Secondary | ICD-10-CM | POA: Diagnosis not present

## 2021-07-04 DIAGNOSIS — R293 Abnormal posture: Secondary | ICD-10-CM | POA: Insufficient documentation

## 2021-07-04 DIAGNOSIS — Z483 Aftercare following surgery for neoplasm: Secondary | ICD-10-CM | POA: Diagnosis not present

## 2021-07-04 DIAGNOSIS — Z170 Estrogen receptor positive status [ER+]: Secondary | ICD-10-CM | POA: Insufficient documentation

## 2021-07-04 NOTE — Therapy (Signed)
[facility] @ [facility] [place], [place], [address] Phone: [phone]   Fax:  [phone]  Physical Therapy Treatment  Patient Details  Name: Christy Young MRN: [id number] Date of Birth: 11-12-1945 Referring Provider (PT): Dr. Donnie Mesa   Encounter Date: 07/04/2021   PT End of Session - 07/04/21 1339     Visit Number 2    Number of Visits 2    PT Start Time 1304    PT Stop Time 1345    PT Time Calculation (min) 41 min    Activity Tolerance Patient tolerated treatment well    Behavior During Therapy [facility] for tasks assessed/performed             Past Medical History:  Diagnosis Date   ARTHRITIS    Breast cancer ([place])    Carpal tunnel syndrome 08/23/2015   Left   Exertional dyspnea    Fatigue    Gout, unspecified    Hyperlipidemia    Hypertension    meds since age 68    Hyperuricemia    Nonspecific abnormal electrocardiogram (ECG) (EKG)    t wave non acute     OBESITY    Palpitations    hospitalized 2005 felt from stress neg cards eval   Primary hyperparathyroidism ([place])    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    Past Surgical History:  Procedure Laterality Date   ABDOMINAL HYSTERECTOMY     BREAST BIOPSY     right side   BREAST LUMPECTOMY WITH RADIOACTIVE SEED AND SENTINEL LYMPH NODE BIOPSY Left 06/07/2021   Procedure: LEFT BREAST LUMPECTOMY WITH RADIOACTIVE SEED X2 AND AXILLARY SENTINEL LYMPH NODE BIOPSY;  Surgeon: Donnie Mesa, MD;  Location: [facility];  Service: General;  Laterality: Left;   COLONOSCOPY     FACIAL COSMETIC SURGERY  1980    There were no vitals filed for this visit.   Subjective Assessment - 07/04/21 1308     Subjective Patient underwent a left lumpectomy and sentinel node biopsy (1 negative node) on 06/07/2021. Her Oncotype score was high so she will begin chemotherapy 07/15/2021. She will also undergo radiation and anti-estrogen therapy.    Pertinent  History Patient underwent a left lumpectomy and sentinel node biopsy (1 negative node) on 06/07/2021. It is ER/PR positive and HER2 negative with a Ki67 of 40%.    Patient Stated Goals See if my arm is doing ok    Currently in Pain? No/denies                [facility] Xxx PT Assessment - 07/04/21 0001       Assessment   Medical Diagnosis s/p left lumpectomy and SLNB    Referring Provider (PT) Dr. Donnie Mesa    Onset Date/Surgical Date 06/07/21    Hand Dominance Right    Prior Therapy Baselines      Precautions   Precautions Other (comment)    Precaution Comments recent surgery and left arm lymphedema risk      Restrictions   Weight Bearing Restrictions No      Balance Screen   Has the patient fallen in the past 6 months No    Has the patient had a decrease in activity level because of a fear of falling?  No    Is the patient reluctant to leave their home because of a fear of falling?  No      Home Ecologist residence  Living Arrangements Alone    Available Help at Discharge Family      Prior Function   Level of [place] Retired    Leisure She does not exercise      Cognition   Overall Cognitive Status Within Functional Limits for tasks assessed      Observation/Other Assessments   Observations Left axillary and breast incisions both appear to be healing well with no signs of infection. Small area of swelling just superior to her breast incision so compression foam in stockinette was placed between swelling area and bra.      Posture/Postural Control   Posture/Postural Control Postural limitations    Postural Limitations Rounded Shoulders;Forward head      ROM / Strength   AROM / PROM / Strength AROM      AROM   AROM Assessment Site Shoulder    Right/Left Shoulder Left    Left Shoulder Extension 58 Degrees    Left Shoulder Flexion 152 Degrees    Left Shoulder ABduction 147 Degrees    Left Shoulder  Internal Rotation 53 Degrees    Left Shoulder External Rotation 82 Degrees      Strength   Overall Strength Within functional limits for tasks performed               LYMPHEDEMA/ONCOLOGY QUESTIONNAIRE - 07/04/21 0001       Type   Cancer Type Left breast cancer      Surgeries   Lumpectomy Date 06/07/21    Sentinel Lymph Node Biopsy Date 06/07/21    Number Lymph Nodes Removed 1      Treatment   Active Chemotherapy Treatment No    Past Chemotherapy Treatment No    Active Radiation Treatment No    Past Radiation Treatment No    Current Hormone Treatment No    Past Hormone Therapy No      What other symptoms do you have   Are you Having Heaviness or Tightness No    Are you having Pain No    Are you having pitting edema No    Is it Hard or Difficult finding clothes that fit No    Do you have infections No    Is there Decreased scar mobility No    [place] Sign No      Lymphedema Assessments   Lymphedema Assessments Upper extremities      Right Upper Extremity Lymphedema   10 cm Proximal to Olecranon Process 33.8 cm    Olecranon Process 25.5 cm    10 cm Proximal to Ulnar Styloid Process 25.3 cm    Just Proximal to Ulnar Styloid Process 18.4 cm    Across Hand at [employer] 19.3 cm    At [place] of 2nd Digit 6.3 cm      Left Upper Extremity Lymphedema   10 cm Proximal to Olecranon Process 37.3 cm    Olecranon Process 28.9 cm    10 cm Proximal to Ulnar Styloid Process 24.9 cm    Just Proximal to Ulnar Styloid Process 19.4 cm    Across Hand at [employer] 19.2 cm    At [place] of 2nd Digit 6.2 cm                Quick Dash - 07/04/21 0001     Open a tight or new jar No difficulty    Do heavy household chores (wash walls, wash floors) Mild difficulty    Carry a  shopping bag or briefcase No difficulty    Wash your back No difficulty    Use a knife to cut food No difficulty    Recreational activities in which you take some force or impact through your  arm, shoulder, or hand (golf, hammering, tennis) No difficulty    During the past week, to what extent has your arm, shoulder or hand problem interfered with your normal social activities with family, friends, neighbors, or groups? Not at all    During the past week, to what extent has your arm, shoulder or hand problem limited your work or other regular daily activities Not at all    Arm, shoulder, or hand pain. Mild    Tingling (pins and needles) in your arm, shoulder, or hand Mild    Difficulty Sleeping No difficulty    DASH Score 6.82 %                             PT Education - 07/04/21 1338     Education Details Lymphedema education and post op HEP; Scar massage    Person(s) Educated Patient    Methods Explanation;Demonstration;Handout    Comprehension Returned demonstration;Verbalized understanding                 PT Long Term Goals - 07/04/21 1348       PT LONG TERM GOAL #1   Title Patient will demonstrate she has regained full shoulder ROM and function post operatively compared to baselines.    Time 8    Period Weeks    Status Achieved                   Plan - 07/04/21 1339     Clinical Impression Statement Patient is doing very well s/p left lumpectomy and sentinel node biopsy on 06/07/2021. Her incisions have healed, her shoulder ROM is back to baseline, and there are no signs of early lymphedema. She will benefit from attending the After Breast Cancer class for lymphedema education and from L-Dex screens every 3 months for 2 years to detect subclinical lymphedema.    PT Treatment/Interventions ADLs/Self Care Home Management;Therapeutic exercise;Patient/family education    PT Next Visit Plan D/C    PT Home Exercise Plan Post op HEP    Consulted and Agree with Plan of Care Patient             Patient will benefit from skilled therapeutic intervention in order to improve the following deficits and impairments:  Postural  dysfunction, Decreased range of motion, Decreased knowledge of precautions, Impaired UE functional use, Pain  Visit Diagnosis: Malignant neoplasm of upper-outer quadrant of left breast in female, estrogen receptor positive ([place])  Abnormal posture  Aftercare following surgery for neoplasm     Problem List Patient Active Problem List   Diagnosis Date Noted   Malignant neoplasm of upper-outer quadrant of left breast in female, estrogen receptor positive ([place]) 05/30/2021   Pain of left lower extremity 03/16/2021   Exertional dyspnea 03/08/2021   Decreased exercise tolerance 03/08/2021   Hyperparathyroidism ([place]) 02/09/2018   Abnormal prominence of clavicle 02/07/2018   Psoriasis 10/23/2017   Carpal tunnel syndrome 08/23/2015   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   H/O: gout 08/08/2015   Elevated blood sugar 08/08/2015   Essential hypertension 07/09/2014   Hyperlipidemia 07/09/2014   Body aches 07/09/2014   Urinary frequency 07/09/2014   Numbness and tingling in  left hand 07/09/2014   Visit for preventive health examination 04/11/2013   Obesity (BMI 30-39.9) 04/11/2013   Statin intolerance 04/11/2013   At risk for coronary artery disease 05/15/2012   Family history of premature coronary heart disease 04/23/2012   Fatigue 12/29/2010   Nonspecific abnormal electrocardiogram (ECG) (EKG) 12/29/2010   Hyperuricemia 12/29/2010   Palpitations    OBESITY 01/10/2010   HYPERLIPIDEMIA 11/09/2009   Gout 11/09/2009   HYPERTENSION 11/09/2009   ARTHRITIS 11/09/2009   PHYSICAL THERAPY DISCHARGE SUMMARY  Visits from Start of Care: 2  Current functional level related to goals / functional outcomes: Goals met. See above for objective findings.   Remaining deficits: none   Education / Equipment: HEP and lymphedema education  Patient agrees to discharge. Patient goals were met. Patient is being discharged due to meeting the stated rehab goals.  Annia Friendly, [place] 07/04/21 1:49 PM    [facility] @ [facility] [place], [place], [address] Phone: [phone]   Fax:  [phone]  Name: Christy Young MRN: [id number] Date of Birth: 10/27/1945

## 2021-07-04 NOTE — Patient Instructions (Addendum)
° ° °   [facility]  [address]  [place] [address]  [phone]  After Breast Cancer Class It is recommended you attend the ABC class to be educated on lymphedema risk reduction. This class is free of charge and lasts for 1 hour. It is a 1-time class. You will need to download the Webex app either on your phone or computer. We will send you a link the night before or the morning of the class. You should be able to click on that link to join the class. This is not a confidential class. You don't have to turn your camera on, but other participants may be able to see your email address. You are scheduled for August 01, 2021 at 11:00.  Scar massage You can begin gentle scar massage to you incision sites. Gently place one hand on the incision and move the skin (without sliding on the skin) in various directions. Do this for a few minutes and then you can gently massage either coconut oil or vitamin E cream into the scars.  Compression garment You should continue wearing your compression bra until you feel like you no longer have swelling.  Home exercise Program Continue doing the exercises you were given until you feel like you can do them without feeling any tightness at the end.   Walking Program Studies show that 30 minutes of walking per day (fast enough to elevate your heart rate) can significantly reduce the risk of a cancer recurrence. If you can't walk due to other medical reasons, we encourage you to find another activity you could do (like a stationary bike or water exercise).  Posture After breast cancer surgery, people frequently sit with rounded shoulders posture because it puts their incisions on slack and feels better. If you sit like this and scar tissue forms in that position, you can become very tight and have pain sitting or standing with good posture. Try to be aware of your posture and sit and stand up tall to heal properly.  Follow up  PT: It is recommended you return every 3 months for the first 3 years following surgery to be assessed on the SOZO machine for an L-Dex score. This helps prevent clinically significant lymphedema in 95% of patients. These follow up screens are 10 minute appointments that you are not billed for. You are scheduled for March 13th at 9:10.

## 2021-07-05 ENCOUNTER — Encounter: Payer: Self-pay | Admitting: *Deleted

## 2021-07-08 NOTE — Progress Notes (Signed)
Pharmacist Chemotherapy Monitoring - Initial Assessment    Anticipated start date: 07/15/21    The following has been reviewed per standard work regarding the patient's treatment regimen: The patient's diagnosis, treatment plan and drug doses, and organ/hematologic function Lab orders and baseline tests specific to treatment regimen  The treatment plan start date, drug sequencing, and pre-medications Prior authorization status  Patient's documented medication list, including drug-drug interaction screen and prescriptions for anti-emetics and supportive care specific to the treatment regimen The drug concentrations, fluid compatibility, administration routes, and timing of the medications to be used The patient's access for treatment and lifetime cumulative dose history, if applicable  The patient's medication allergies and previous infusion related reactions, if applicable   Changes made to treatment plan:  N/A  Follow up needed:  Pending authorization for treatment    Philomena Course, [place], 07/08/2021  12:42 PM

## 2021-07-15 ENCOUNTER — Encounter: Payer: Self-pay | Admitting: Hematology

## 2021-07-15 ENCOUNTER — Encounter: Payer: Self-pay | Admitting: *Deleted

## 2021-07-15 ENCOUNTER — Other Ambulatory Visit: Payer: Self-pay

## 2021-07-15 ENCOUNTER — Inpatient Hospital Stay (HOSPITAL_BASED_OUTPATIENT_CLINIC_OR_DEPARTMENT_OTHER): Payer: Medicare Other | Admitting: Hematology

## 2021-07-15 ENCOUNTER — Inpatient Hospital Stay: Payer: Medicare Other

## 2021-07-15 VITALS — BP 136/75 | HR 77 | Temp 98.2°F | Resp 18 | Ht 64.0 in | Wt 215.6 lb

## 2021-07-15 VITALS — BP 133/81 | HR 80 | Temp 98.4°F | Resp 18

## 2021-07-15 DIAGNOSIS — R630 Anorexia: Secondary | ICD-10-CM | POA: Diagnosis not present

## 2021-07-15 DIAGNOSIS — Z5111 Encounter for antineoplastic chemotherapy: Secondary | ICD-10-CM | POA: Diagnosis not present

## 2021-07-15 DIAGNOSIS — Z170 Estrogen receptor positive status [ER+]: Secondary | ICD-10-CM | POA: Diagnosis not present

## 2021-07-15 DIAGNOSIS — C50412 Malignant neoplasm of upper-outer quadrant of left female breast: Secondary | ICD-10-CM | POA: Diagnosis not present

## 2021-07-15 DIAGNOSIS — Z5189 Encounter for other specified aftercare: Secondary | ICD-10-CM | POA: Diagnosis not present

## 2021-07-15 DIAGNOSIS — R197 Diarrhea, unspecified: Secondary | ICD-10-CM | POA: Diagnosis not present

## 2021-07-15 LAB — CMP (CANCER CENTER ONLY)
ALT: 21 U/L (ref 0–44)
AST: 15 U/L (ref 15–41)
Albumin: 4.3 g/dL (ref 3.5–5.0)
Alkaline Phosphatase: 59 U/L (ref 38–126)
Anion gap: 10 (ref 5–15)
BUN: 14 mg/dL (ref 8–23)
CO2: 23 mmol/L (ref 22–32)
Calcium: 10.7 mg/dL — ABNORMAL HIGH (ref 8.9–10.3)
Chloride: 109 mmol/L (ref 98–111)
Creatinine: 0.73 mg/dL (ref 0.44–1.00)
GFR, Estimated: >60 mL/min (ref >60)
Glucose, Bld: 101 mg/dL — ABNORMAL HIGH (ref 70–99)
Potassium: 3.8 mmol/L (ref 3.5–5.1)
Sodium: 142 mmol/L (ref 135–145)
Total Bilirubin: 1 mg/dL (ref 0.3–1.2)
Total Protein: 7.2 g/dL (ref 6.5–8.1)

## 2021-07-15 LAB — CBC WITH DIFFERENTIAL (CANCER CENTER ONLY)
Abs Immature Granulocytes: 0.02 10*3/uL (ref 0.00–0.07)
Basophils Absolute: 0.1 10*3/uL (ref 0.0–0.1)
Basophils Relative: 1 %
Eosinophils Absolute: 0.1 10*3/uL (ref 0.0–0.5)
Eosinophils Relative: 2 %
HCT: 39.5 % (ref 36.0–46.0)
Hemoglobin: 13.2 g/dL (ref 12.0–15.0)
Immature Granulocytes: 0 %
Lymphocytes Relative: 40 %
Lymphs Abs: 2.7 10*3/uL (ref 0.7–4.0)
MCH: 29.7 pg (ref 26.0–34.0)
MCHC: 33.4 g/dL (ref 30.0–36.0)
MCV: 89 fL (ref 80.0–100.0)
Monocytes Absolute: 0.7 10*3/uL (ref 0.1–1.0)
Monocytes Relative: 10 %
Neutro Abs: 3.1 10*3/uL (ref 1.7–7.7)
Neutrophils Relative %: 47 %
Platelet Count: 262 10*3/uL (ref 150–400)
RBC: 4.44 MIL/uL (ref 3.87–5.11)
RDW: 13.6 % (ref 11.5–15.5)
WBC Count: 6.7 10*3/uL (ref 4.0–10.5)
nRBC: 0 % (ref 0.0–0.2)

## 2021-07-15 MED ORDER — SODIUM CHLORIDE 0.9 % IV SOLN
10.0000 mg | Freq: Once | INTRAVENOUS | Status: AC
  Administered 2021-07-15: 10 mg via INTRAVENOUS
  Filled 2021-07-15: qty 10

## 2021-07-15 MED ORDER — PALONOSETRON HCL INJECTION 0.25 MG/5ML
0.2500 mg | Freq: Once | INTRAVENOUS | Status: AC
  Administered 2021-07-15: 0.25 mg via INTRAVENOUS
  Filled 2021-07-15: qty 5

## 2021-07-15 MED ORDER — SODIUM CHLORIDE 0.9 % IV SOLN: Freq: Once | INTRAVENOUS | Status: AC

## 2021-07-15 MED ORDER — SODIUM CHLORIDE 0.9 % IV SOLN
600.0000 mg/m2 | Freq: Once | INTRAVENOUS | Status: AC
  Administered 2021-07-15: 1260 mg via INTRAVENOUS
  Filled 2021-07-15: qty 63

## 2021-07-15 MED ORDER — SODIUM CHLORIDE 0.9 % IV SOLN
75.0000 mg/m2 | Freq: Once | INTRAVENOUS | Status: AC
  Administered 2021-07-15: 160 mg via INTRAVENOUS
  Filled 2021-07-15: qty 16

## 2021-07-15 NOTE — Patient Instructions (Signed)
[facility]  Discharge Instructions: Thank you for choosing [facility] to provide your oncology and hematology care.   If you have a lab appointment with the [facility], please go directly to the [facility] and check in at the registration area.   Wear comfortable clothing and clothing appropriate for easy access to any Portacath or PICC line.   We strive to give you quality time with your provider. You may need to reschedule your appointment if you arrive late (15 or more minutes).  Arriving late affects you and other patients whose appointments are after yours.  Also, if you miss three or more appointments without notifying the office, you may be dismissed from the clinic at the providers discretion.      For prescription refill requests, have your pharmacy contact our office and allow 72 hours for refills to be completed.    Today you received the following chemotherapy and/or immunotherapy agents:  Docetaxel  (Taxotere) and Cyclophosphamide (Cytoxan)   To help prevent nausea and vomiting after your treatment, we encourage you to take your nausea medication as directed.  BELOW ARE SYMPTOMS THAT SHOULD BE REPORTED IMMEDIATELY: *FEVER GREATER THAN 100.4 F (38 C) OR HIGHER *CHILLS OR SWEATING *NAUSEA AND VOMITING THAT IS NOT CONTROLLED WITH YOUR NAUSEA MEDICATION *UNUSUAL SHORTNESS OF BREATH *UNUSUAL BRUISING OR BLEEDING *URINARY PROBLEMS (pain or burning when urinating, or frequent urination) *BOWEL PROBLEMS (unusual diarrhea, constipation, pain near the anus) TENDERNESS IN MOUTH AND THROAT WITH OR WITHOUT PRESENCE OF ULCERS (sore throat, sores in mouth, or a toothache) UNUSUAL RASH, SWELLING OR PAIN  UNUSUAL VAGINAL DISCHARGE OR ITCHING   Items with * indicate a potential emergency and should be followed up as soon as possible or go to the Emergency Department if any problems should occur.  Please show the CHEMOTHERAPY ALERT CARD  or IMMUNOTHERAPY ALERT CARD at check-in to the Emergency Department and triage nurse.  Should you have questions after your visit or need to cancel or reschedule your appointment, please contact [facility]  Dept: [phone]  and follow the prompts.  Office hours are 8:00 a.m. to 4:30 p.m. Monday - Friday. Please note that voicemails left after 4:00 p.m. may not be returned until the following business day.  We are closed weekends and major holidays. You have access to a nurse at all times for urgent questions. Please call the main number to the clinic Dept: [phone] and follow the prompts.   For any non-urgent questions, you may also contact your provider using MyChart. We now offer e-Visits for anyone 51 and older to request care online for non-urgent symptoms. For details visit [email or web address].   Also download the MyChart app! Go to the app store, search "MyChart", open the app, select [facility], and log in with your MyChart username and password.  Due to Covid, a mask is required upon entering the hospital/clinic. If you do not have a mask, one will be given to you upon arrival. For doctor visits, patients may have 1 support person aged 101 or older with them. For treatment visits, patients cannot have anyone with them due to current Covid guidelines and our immunocompromised population.  Docetaxel injection What is this medication? DOCETAXEL (doe se TAX el) is a chemotherapy drug. It targets fast dividing cells, like cancer cells, and causes these cells to die. This medicine is used to treat many types of cancers like breast cancer, certain stomach cancers, head and  neck cancer, lung cancer, and prostate cancer. This medicine may be used for other purposes; ask your health care provider or pharmacist if you have questions. COMMON BRAND NAME(S): Docefrez, Taxotere What should I tell my care team before I take this medication? They need to know if you  have any of these conditions: infection (especially a virus infection such as chickenpox, cold sores, or herpes) liver disease low blood counts, like low white cell, platelet, or red cell counts an unusual or allergic reaction to docetaxel, polysorbate 80, other chemotherapy agents, other medicines, foods, dyes, or preservatives pregnant or trying to get pregnant breast-feeding How should I use this medication? This drug is given as an infusion into a vein. It is administered in a hospital or clinic by a specially trained health care professional. Talk to your pediatrician regarding the use of this medicine in children. Special care may be needed. Overdosage: If you think you have taken too much of this medicine contact a poison control center or emergency room at once. NOTE: This medicine is only for you. Do not share this medicine with others. What if I miss a dose? It is important not to miss your dose. Call your doctor or health care professional if you are unable to keep an appointment. What may interact with this medication? Do not take this medicine with any of the following medications: live virus vaccines This medicine may also interact with the following medications: aprepitant certain antibiotics like erythromycin or clarithromycin certain antivirals for HIV or hepatitis certain medicines for fungal infections like fluconazole, itraconazole, ketoconazole, posaconazole, or voriconazole cimetidine ciprofloxacin conivaptan cyclosporine dronedarone fluvoxamine grapefruit juice imatinib verapamil This list may not describe all possible interactions. Give your health care provider a list of all the medicines, herbs, non-prescription drugs, or dietary supplements you use. Also tell them if you smoke, drink alcohol, or use illegal drugs. Some items may interact with your medicine. What should I watch for while using this medication? Your condition will be monitored carefully while  you are receiving this medicine. You will need important blood work done while you are taking this medicine. Call your doctor or health care professional for advice if you get a fever, chills or sore throat, or other symptoms of a cold or flu. Do not treat yourself. This drug decreases your body's ability to fight infections. Try to avoid being around people who are sick. Some products may contain alcohol. Ask your health care professional if this medicine contains alcohol. Be sure to tell all health care professionals you are taking this medicine. Certain medicines, like metronidazole and disulfiram, can cause an unpleasant reaction when taken with alcohol. The reaction includes flushing, headache, nausea, vomiting, sweating, and increased thirst. The reaction can last from 30 minutes to several hours. You may get drowsy or dizzy. Do not drive, use machinery, or do anything that needs mental alertness until you know how this medicine affects you. Do not stand or sit up quickly, especially if you are an older patient. This reduces the risk of dizzy or fainting spells. Alcohol may interfere with the effect of this medicine. Talk to your health care professional about your risk of cancer. You may be more at risk for certain types of cancer if you take this medicine. Do not become pregnant while taking this medicine or for 6 months after stopping it. Women should inform their doctor if they wish to become pregnant or think they might be pregnant. There is a potential for serious side  effects to an unborn child. Talk to your health care professional or pharmacist for more information. Do not breast-feed an infant while taking this medicine or for 1 week after stopping it. Males who get this medicine must use a condom during sex with females who can get pregnant. If you get a woman pregnant, the baby could have birth defects. The baby could die before they are born. You will need to continue wearing a condom for 3  months after stopping the medicine. Tell your health care provider right away if your partner becomes pregnant while you are taking this medicine. This may interfere with the ability to father a child. You should talk to your doctor or health care professional if you are concerned about your fertility. What side effects may I notice from receiving this medication? Side effects that you should report to your doctor or health care professional as soon as possible: allergic reactions like skin rash, itching or hives, swelling of the face, lips, or tongue blurred vision breathing problems changes in vision low blood counts - This drug may decrease the number of white blood cells, red blood cells and platelets. You may be at increased risk for infections and bleeding. nausea and vomiting pain, redness or irritation at site where injected pain, tingling, numbness in the hands or feet redness, blistering, peeling, or loosening of the skin, including inside the mouth signs of decreased platelets or bleeding - bruising, pinpoint red spots on the skin, black, tarry stools, nosebleeds signs of decreased red blood cells - unusually weak or tired, fainting spells, lightheadedness signs of infection - fever or chills, cough, sore throat, pain or difficulty passing urine swelling of the ankle, feet, hands Side effects that usually do not require medical attention (report to your doctor or health care professional if they continue or are bothersome): constipation diarrhea fingernail or toenail changes hair loss loss of appetite mouth sores muscle pain This list may not describe all possible side effects. Call your doctor for medical advice about side effects. You may report side effects to FDA at 1-800-FDA-1088. Where should I keep my medication? This drug is given in a hospital or clinic and will not be stored at home. NOTE: This sheet is a summary. It may not cover all possible information. If you have  questions about this medicine, talk to your doctor, pharmacist, or health care provider.  2022 Elsevier/Gold Standard (2021-03-01 00:00:00)   Cyclophosphamide (Cytoxan) Injection What is this medication? CYCLOPHOSPHAMIDE (sye kloe FOSS fa mide) is a chemotherapy drug. It slows the growth of cancer cells. This medicine is used to treat many types of cancer like lymphoma, myeloma, leukemia, breast cancer, and ovarian cancer, to name a few. This medicine may be used for other purposes; ask your health care provider or pharmacist if you have questions. COMMON BRAND NAME(S): Cytoxan, Neosar What should I tell my care team before I take this medication? They need to know if you have any of these conditions: heart disease history of irregular heartbeat infection kidney disease liver disease low blood counts, like white cells, platelets, or red blood cells on hemodialysis recent or ongoing radiation therapy scarring or thickening of the lungs trouble passing urine an unusual or allergic reaction to cyclophosphamide, other medicines, foods, dyes, or preservatives pregnant or trying to get pregnant breast-feeding How should I use this medication? This drug is usually given as an injection into a vein or muscle or by infusion into a vein. It is administered in a hospital  or clinic by a specially trained health care professional. Talk to your pediatrician regarding the use of this medicine in children. Special care may be needed. Overdosage: If you think you have taken too much of this medicine contact a poison control center or emergency room at once. NOTE: This medicine is only for you. Do not share this medicine with others. What if I miss a dose? It is important not to miss your dose. Call your doctor or health care professional if you are unable to keep an appointment. What may interact with this medication? amphotericin B azathioprine certain antivirals for HIV or hepatitis certain  medicines for blood pressure, heart disease, irregular heart beat certain medicines that treat or prevent blood clots like warfarin certain other medicines for cancer cyclosporine etanercept indomethacin medicines that relax muscles for surgery medicines to increase blood counts metronidazole This list may not describe all possible interactions. Give your health care provider a list of all the medicines, herbs, non-prescription drugs, or dietary supplements you use. Also tell them if you smoke, drink alcohol, or use illegal drugs. Some items may interact with your medicine. What should I watch for while using this medication? Your condition will be monitored carefully while you are receiving this medicine. You may need blood work done while you are taking this medicine. Drink water or other fluids as directed. Urinate often, even at night. Some products may contain alcohol. Ask your health care professional if this medicine contains alcohol. Be sure to tell all health care professionals you are taking this medicine. Certain medicines, like metronidazole and disulfiram, can cause an unpleasant reaction when taken with alcohol. The reaction includes flushing, headache, nausea, vomiting, sweating, and increased thirst. The reaction can last from 30 minutes to several hours. Do not become pregnant while taking this medicine or for 1 year after stopping it. Women should inform their health care professional if they wish to become pregnant or think they might be pregnant. Men should not father a child while taking this medicine and for 4 months after stopping it. There is potential for serious side effects to an unborn child. Talk to your health care professional for more information. Do not breast-feed an infant while taking this medicine or for 1 week after stopping it. This medicine has caused ovarian failure in some women. This medicine may make it more difficult to get pregnant. Talk to your health  care professional if you are concerned about your fertility. This medicine has caused decreased sperm counts in some men. This may make it more difficult to father a child. Talk to your health care professional if you are concerned about your fertility. Call your health care professional for advice if you get a fever, chills, or sore throat, or other symptoms of a cold or flu. Do not treat yourself. This medicine decreases your body's ability to fight infections. Try to avoid being around people who are sick. Avoid taking medicines that contain aspirin, acetaminophen, ibuprofen, naproxen, or ketoprofen unless instructed by your health care professional. These medicines may hide a fever. Talk to your health care professional about your risk of cancer. You may be more at risk for certain types of cancer if you take this medicine. If you are going to need surgery or other procedure, tell your health care professional that you are using this medicine. Be careful brushing or flossing your teeth or using a toothpick because you may get an infection or bleed more easily. If you have any dental work  done, tell your dentist you are receiving this medicine. What side effects may I notice from receiving this medication? Side effects that you should report to your doctor or health care professional as soon as possible: allergic reactions like skin rash, itching or hives, swelling of the face, lips, or tongue breathing problems nausea, vomiting signs and symptoms of bleeding such as bloody or black, tarry stools; red or dark brown urine; spitting up blood or brown material that looks like coffee grounds; red spots on the skin; unusual bruising or bleeding from the eyes, gums, or nose signs and symptoms of heart failure like fast, irregular heartbeat, sudden weight gain; swelling of the ankles, feet, hands signs and symptoms of infection like fever; chills; cough; sore throat; pain or trouble passing urine signs  and symptoms of kidney injury like trouble passing urine or change in the amount of urine signs and symptoms of liver injury like dark yellow or brown urine; general ill feeling or flu-like symptoms; light-colored stools; loss of appetite; nausea; right upper belly pain; unusually weak or tired; yellowing of the eyes or skin Side effects that usually do not require medical attention (report to your doctor or health care professional if they continue or are bothersome): confusion decreased hearing diarrhea facial flushing hair loss headache loss of appetite missed menstrual periods signs and symptoms of low red blood cells or anemia such as unusually weak or tired; feeling faint or lightheaded; falls skin discoloration This list may not describe all possible side effects. Call your doctor for medical advice about side effects. You may report side effects to FDA at 1-800-FDA-1088. Where should I keep my medication? This drug is given in a hospital or clinic and will not be stored at home. NOTE: This sheet is a summary. It may not cover all possible information. If you have questions about this medicine, talk to your doctor, pharmacist, or health care provider.  2022 Elsevier/Gold Standard (2021-03-01 00:00:00)

## 2021-07-15 NOTE — Progress Notes (Signed)
[place]   Telephone:(336) [phone] Fax:(336) [phone]   Clinic Follow up Note   Patient Care Team: [name], Standley Brooking, MD as PCP - General (Internal Medicine) Valinda Party, MD as Referring Physician (Rheumatology) Donnie Mesa, MD as Consulting Physician (General Surgery) Truitt Merle, MD as Consulting Physician (Hematology) Eppie Gibson, MD as Attending Physician (Radiation Oncology) Mauro Kaufmann, RN as Oncology Nurse Navigator Rockwell Germany, RN as Oncology Nurse Navigator Magnus Sinning, MD as Consulting Physician (Physical Medicine and Rehabilitation) Nigel Mormon, MD as Consulting Physician (Cardiology) Renato Shin, MD as Consulting Physician (Endocrinology)  Date of Service:  07/15/2021  CHIEF COMPLAINT: f/u of left breast cancer  CURRENT THERAPY:  Adjuvant TC q3weeks, starting 07/15/21  ASSESSMENT & PLAN:  Christy Young is a 76 y.o. female with   1. 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 B/l diagnostic MM and left [place] on 05/02/21 showed two masses in left breast at 1 o'clock, 2.3 cm at 13 cmfn and 0.9 cm at 10 cmfn. Biopsy on 05/23/21 confirmed invasive ductal carcinoma to both sites. -left lumpectomy on 06/07/21 by Dr. Georgette Dover showed grade 3 IDC to both tumors (2.5 cm and 0.8 cm) with small foci of DCIS. Margins and lymph node were negative. -Oncotype Dx recurrence score of 29, high risk -she is scheduled to begin adjuvant TC today. She has recovered well from surgery. We reviewed the medications she needs to take after treatment, including dexa and antiemetics     2. Bone Health  -Her most recent DEXA was 06/08/20 showing -0.8 (normal) -I previously explained that AI can decrease bone density.     PLAN:  -lab reviewed, will proceed to first TC today  -Udenyca on day 3 -lab and f/u in 6-7 days  for toxicity check -lab, f/u, and cycle 2 TC on 08/05/21 as scheduled   No problem-specific Assessment & Plan notes found for this encounter.   SUMMARY OF ONCOLOGIC HISTORY: Oncology History Overview Note   Cancer Staging  Malignant neoplasm of upper-outer quadrant of left breast in female, estrogen receptor positive ([place]) Staging form: Breast, AJCC 8th Edition - Clinical stage from 06/01/2021: Stage IIA (cT2, cN0, cM0, G3, ER+, PR+, HER2-) - Unsigned      Malignant neoplasm of upper-outer quadrant of left breast in female, estrogen receptor positive ([place])  05/02/2021 Mammogram   Exam: 3D Mammogram Diagnostic Bilateral IMPRESSION: Palpable 2.4 cm irregular high density mass in the upper-outer left breast.  Exam: Breast Ultrasound - Left IMPRESSION: 1. 2.3 cm irregular mass in left breast at 1 o'clock posterior depth, 13 mc from nipple. 2. 0.9 cm irregular mass in left breast at 1 o'clock posterior depth, 10 cm from nipple 3. Three small 3-4 mm round masses favored to represent small cysts seen at 2:30-3:00 11-12 cm from nipple. 4. No abnormal nodes in left axilla.   05/23/2021 Initial Biopsy   Diagnosis 1. Breast, left, needle core biopsy, breast mass, 2.3 cm @ 1:00 13 CMFN  - INVASIVE DUCTAL CARCINOMA, grade 3  2. Breast, left, needle core biopsy, breast mass, 0.9 cm @ 1:00 10 CMFN  - INVASIVE DUCTAL CARCINOMA, grade 1  1. PROGNOSTIC INDICATORS Results: The tumor cells are EQUIVOCAL for Her2 (2+) Estrogen Receptor: 100%, POSITIVE, STRONG STAINING INTENSITY Progesterone Receptor: 60%, POSITIVE, STRONG STAINING INTENSITY Proliferation Marker Ki67: 40%  1. FLUORESCENCE IN-SITU HYBRIDIZATION Results: GROUP 5:  HER2 **NEGATIVE**  2. PROGNOSTIC INDICATORS Results: The tumor cells are NEGATIVE for Her2 (1+) Estrogen Receptor: 100%, POSITIVE, STRONG STAINING INTENSITY Progesterone Receptor: 100%, POSITIVE, STRONG STAINING INTENSITY Proliferation Marker Ki67: 10%    05/30/2021 Initial Diagnosis   Malignant neoplasm of upper-outer quadrant of left breast in female, estrogen receptor positive ([place])   06/07/2021 Definitive Surgery   FINAL MICROSCOPIC DIAGNOSIS:   A. BREAST, LEFT, LUMPECTOMY:  Invasive ductal carcinomas (two independent lesions, 2.5 cm and 0.8 cm) with clear margins of resection.  Please see the synoptic report after specimen B.   B. LYMPH NODE, LEFT AXILLARY #1, SENTINEL, EXCISION:  Hyperplastic lymph node with fatty infiltration which is negative for  metastatic carcinoma.    06/07/2021 Oncotype testing   Oncotype DX was obtained on the final surgical sample and the recurrence score of 28 predicts a risk of recurrence outside the breast over the next 9 years of 18%, if the patient's only systemic therapy is an antiestrogen for 5 years.  It also predicts a significant benefit from chemotherapy.    07/13/2021 -  Chemotherapy   Patient is on Treatment Plan : BREAST TC q21d        INTERVAL HISTORY:  Christy Young is here for a follow up of breast cancer. She was last seen by me on 06/29/21. She presents to the clinic alone.   All other systems were reviewed with the patient and are negative.  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  ALLERGIES:  is allergic to pravastatin, rosuvastatin, and penicillins.  MEDICATIONS:  Current Outpatient Medications  Medication Sig Dispense Refill   allopurinol (ZYLOPRIM) 100 MG tablet TAKE 2 TABLETS BY MOUTH EVERY DAY (Patient taking differently: Take 100 mg by mouth 2 (two) times daily.) 180 tablet 0   atorvastatin (LIPITOR) 40 MG tablet Take 1 tablet (40 mg total) by mouth daily. 90 tablet 3   dexamethasone (DECADRON) 4 MG tablet Take 1 tablet (4 mg total) by mouth 2 (two) times daily. Start the day before Taxotere. Then again the day after chemo for 3 days. 30 tablet 1   furosemide (LASIX) 20 MG tablet Take 1 tablet (20 mg total) by mouth daily. 60 tablet 3   HYDROcodone-acetaminophen (NORCO/VICODIN) 5-325 MG tablet Take  1 tablet by mouth every 6 (six) hours as needed for moderate pain. 15 tablet 0   Multiple Vitamins-Minerals (MULTIVITAMIN WITH MINERALS) tablet Take 1 tablet by mouth daily.     nebivolol (BYSTOLIC) 5 MG tablet Take 1 tablet (5 mg total) by mouth daily. 90 tablet 1   ondansetron (ZOFRAN) 8 MG tablet Take 1 tablet (8 mg total) by mouth 2 (two) times daily as needed for refractory nausea / vomiting. Start on day 3 after chemo. 30 tablet 1   prochlorperazine (COMPAZINE) 10 MG tablet Take 1 tablet (10 mg total) by mouth every 6 (six) hours as needed (Nausea or vomiting). 30 tablet 1   Vitamin D, Ergocalciferol, (DRISDOL) 1.25 MG (50000 UNIT) CAPS capsule Take 1 capsule (50,000 Units total) by mouth every 7 (seven) days. 12 capsule 0   No current facility-administered medications for this visit.    PHYSICAL  EXAMINATION: ECOG PERFORMANCE STATUS: 0 - Asymptomatic  Vitals:   07/15/21 0924  BP: 136/75  Pulse: 77  Resp: 18  Temp: 98.2 F (36.8 C)  SpO2: 97%   Wt Readings from Last 3 Encounters:  07/15/21 215 lb 9.6 oz (97.8 kg)  06/29/21 216 lb 9.6 oz (98.2 kg)  06/07/21 211 lb (95.7 kg)     GENERAL:alert, no distress and comfortable SKIN: skin color normal, no rashes or significant lesions EYES: normal, Conjunctiva are pink and non-injected, sclera clear  NEURO: alert & oriented x 3 with fluent speech  LABORATORY DATA:  I have reviewed the data as listed CBC Latest Ref Rng & Units 07/15/2021 06/01/2021 02/16/2021  WBC 4.0 - 10.5 K/uL 6.7 8.2 11.4(H)  Hemoglobin 12.0 - 15.0 g/dL 13.2 13.5 13.4  Hematocrit 36.0 - 46.0 % 39.5 40.3 40.4  Platelets 150 - 400 K/uL 262 276 285.0     CMP Latest Ref Rng & Units 07/15/2021 06/01/2021 03/18/2021  Glucose 70 - 99 mg/dL 101(H) 111(H) -  BUN 8 - 23 mg/dL 14 18 -  Creatinine 0.44 - 1.00 mg/dL 0.73 0.86 -  Sodium 135 - 145 mmol/L 142 144 -  Potassium 3.5 - 5.1 mmol/L 3.8 3.9 -  Chloride 98 - 111 mmol/L 109 110 -  CO2 22 - 32 mmol/L 23 23 -  Calcium 8.9 - 10.3 mg/dL 10.7(H) 10.9(H) 10.9(H)  Total Protein 6.5 - 8.1 g/dL 7.2 7.1 -  Total Bilirubin 0.3 - 1.2 mg/dL 1.0 0.9 -  Alkaline Phos 38 - 126 U/L 59 64 -  AST 15 - 41 U/L 15 17 -  ALT 0 - 44 U/L 21 34 -      RADIOGRAPHIC STUDIES: I have personally reviewed the radiological images as listed and agreed with the findings in the report. No results found.    No orders of the defined types were placed in this encounter.  All questions were answered. The patient knows to call the clinic with any problems, questions or concerns. No barriers to learning was detected. The total time spent in the appointment was 30 minutes.     Truitt Merle, MD 07/15/2021   I, Wilburn Mylar, am acting as scribe for Truitt Merle, MD.   I have reviewed the above documentation for accuracy and completeness, and I  agree with the above.

## 2021-07-18 ENCOUNTER — Inpatient Hospital Stay (HOSPITAL_BASED_OUTPATIENT_CLINIC_OR_DEPARTMENT_OTHER): Payer: Medicare Other

## 2021-07-18 ENCOUNTER — Other Ambulatory Visit: Payer: Self-pay

## 2021-07-18 ENCOUNTER — Telehealth: Payer: Self-pay

## 2021-07-18 ENCOUNTER — Telehealth: Payer: Self-pay | Admitting: Hematology

## 2021-07-18 VITALS — BP 135/77 | HR 75 | Resp 17

## 2021-07-18 DIAGNOSIS — R197 Diarrhea, unspecified: Secondary | ICD-10-CM | POA: Diagnosis not present

## 2021-07-18 DIAGNOSIS — Z170 Estrogen receptor positive status [ER+]: Secondary | ICD-10-CM

## 2021-07-18 DIAGNOSIS — C50412 Malignant neoplasm of upper-outer quadrant of left female breast: Secondary | ICD-10-CM | POA: Diagnosis not present

## 2021-07-18 DIAGNOSIS — R630 Anorexia: Secondary | ICD-10-CM | POA: Diagnosis not present

## 2021-07-18 DIAGNOSIS — Z5111 Encounter for antineoplastic chemotherapy: Secondary | ICD-10-CM | POA: Diagnosis not present

## 2021-07-18 DIAGNOSIS — Z5189 Encounter for other specified aftercare: Secondary | ICD-10-CM | POA: Diagnosis not present

## 2021-07-18 MED ORDER — PEGFILGRASTIM-CBQV 6 MG/0.6ML ~~LOC~~ SOSY
6.0000 mg | PREFILLED_SYRINGE | Freq: Once | SUBCUTANEOUS | Status: AC
  Administered 2021-07-18: 6 mg via SUBCUTANEOUS
  Filled 2021-07-18: qty 0.6

## 2021-07-18 NOTE — Patient Instructions (Signed)

## 2021-07-18 NOTE — Telephone Encounter (Signed)
LM for Christy Young to call the office at [phone] if she has any questions, concerns, or experiencing any side-effects from her treatment that she needs to discuss management of the side-effects.

## 2021-07-18 NOTE — Telephone Encounter (Signed)
-----   Message from Ishmael Holter, RN sent at 07/15/2021  2:39 PM EST ----- Regarding: Dr. Burr Medico 1st tx f/u call Dr. Burr Medico 1st tx f/u call. Taxotere cytoxan. Tolerated well

## 2021-07-18 NOTE — Telephone Encounter (Signed)
Left message with follow-up appointment per 1/20 los.

## 2021-07-21 ENCOUNTER — Inpatient Hospital Stay: Payer: Medicare Other

## 2021-07-21 ENCOUNTER — Other Ambulatory Visit: Payer: Self-pay

## 2021-07-21 ENCOUNTER — Inpatient Hospital Stay (HOSPITAL_BASED_OUTPATIENT_CLINIC_OR_DEPARTMENT_OTHER): Payer: Medicare Other | Admitting: Hematology

## 2021-07-21 VITALS — BP 116/70 | HR 96 | Temp 99.1°F | Resp 187 | Ht 64.0 in | Wt 214.4 lb

## 2021-07-21 DIAGNOSIS — C50412 Malignant neoplasm of upper-outer quadrant of left female breast: Secondary | ICD-10-CM

## 2021-07-21 DIAGNOSIS — R630 Anorexia: Secondary | ICD-10-CM | POA: Diagnosis not present

## 2021-07-21 DIAGNOSIS — R197 Diarrhea, unspecified: Secondary | ICD-10-CM | POA: Diagnosis not present

## 2021-07-21 DIAGNOSIS — Z5111 Encounter for antineoplastic chemotherapy: Secondary | ICD-10-CM | POA: Diagnosis not present

## 2021-07-21 DIAGNOSIS — Z5189 Encounter for other specified aftercare: Secondary | ICD-10-CM | POA: Diagnosis not present

## 2021-07-21 DIAGNOSIS — Z170 Estrogen receptor positive status [ER+]: Secondary | ICD-10-CM

## 2021-07-21 LAB — CMP (CANCER CENTER ONLY)
ALT: 19 U/L (ref 0–44)
AST: 14 U/L — ABNORMAL LOW (ref 15–41)
Albumin: 3.8 g/dL (ref 3.5–5.0)
Alkaline Phosphatase: 62 U/L (ref 38–126)
Anion gap: 7 (ref 5–15)
BUN: 13 mg/dL (ref 8–23)
CO2: 26 mmol/L (ref 22–32)
Calcium: 10.2 mg/dL (ref 8.9–10.3)
Chloride: 104 mmol/L (ref 98–111)
Creatinine: 0.85 mg/dL (ref 0.44–1.00)
GFR, Estimated: >60 mL/min (ref >60)
Glucose, Bld: 146 mg/dL — ABNORMAL HIGH (ref 70–99)
Potassium: 3.8 mmol/L (ref 3.5–5.1)
Sodium: 137 mmol/L (ref 135–145)
Total Bilirubin: 0.9 mg/dL (ref 0.3–1.2)
Total Protein: 6.4 g/dL — ABNORMAL LOW (ref 6.5–8.1)

## 2021-07-21 LAB — CBC WITH DIFFERENTIAL (CANCER CENTER ONLY)
Abs Immature Granulocytes: 0.08 10*3/uL — ABNORMAL HIGH (ref 0.00–0.07)
Basophils Absolute: 0.1 10*3/uL (ref 0.0–0.1)
Basophils Relative: 2 %
Eosinophils Absolute: 0.1 10*3/uL (ref 0.0–0.5)
Eosinophils Relative: 4 %
HCT: 36.9 % (ref 36.0–46.0)
Hemoglobin: 12.6 g/dL (ref 12.0–15.0)
Immature Granulocytes: 3 %
Lymphocytes Relative: 52 %
Lymphs Abs: 1.6 10*3/uL (ref 0.7–4.0)
MCH: 29.8 pg (ref 26.0–34.0)
MCHC: 34.1 g/dL (ref 30.0–36.0)
MCV: 87.2 fL (ref 80.0–100.0)
Monocytes Absolute: 0.3 10*3/uL (ref 0.1–1.0)
Monocytes Relative: 11 %
Neutro Abs: 0.8 10*3/uL — ABNORMAL LOW (ref 1.7–7.7)
Neutrophils Relative %: 28 %
Platelet Count: 217 10*3/uL (ref 150–400)
RBC: 4.23 MIL/uL (ref 3.87–5.11)
RDW: 13.3 % (ref 11.5–15.5)
Smear Review: NORMAL
WBC Count: 3.1 10*3/uL — ABNORMAL LOW (ref 4.0–10.5)
nRBC: 0 % (ref 0.0–0.2)

## 2021-07-21 MED ORDER — SODIUM CHLORIDE 0.9 % IV SOLN: INTRAVENOUS | Status: DC

## 2021-07-21 NOTE — Progress Notes (Signed)
[place]   Telephone:(336) [phone] Fax:(336) [phone]   Clinic Follow up Note   Patient Care Team: [name], Standley Brooking, MD as PCP - General (Internal Medicine) Valinda Party, MD as Referring Physician (Rheumatology) Donnie Mesa, MD as Consulting Physician (General Surgery) Truitt Merle, MD as Consulting Physician (Hematology) Eppie Gibson, MD as Attending Physician (Radiation Oncology) Mauro Kaufmann, RN as Oncology Nurse Navigator Rockwell Germany, RN as Oncology Nurse Navigator Magnus Sinning, MD as Consulting Physician (Physical Medicine and Rehabilitation) Nigel Mormon, MD as Consulting Physician (Cardiology) Renato Shin, MD as Consulting Physician (Endocrinology)  Date of Service:  07/21/2021  CHIEF COMPLAINT: f/u of left breast cancer  CURRENT THERAPY:  Adjuvant TC q3weeks, starting 07/15/21  ASSESSMENT & PLAN:  Christy Young is a 76 y.o. female with   1. Toxicity Check: Nausea, diarrhea, fatigue, palpitations -she reports palpitations. Physical exam shows slight tachycardia, overall no concern. -she reports diarrhea that began last night, causing loss of appetite. I am concerned she is dehydrated; we will give IVF today.  1. 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 B/l diagnostic MM and left [place] on 05/02/21 showed two masses in left breast at 1 o'clock, 2.3 cm at 13 cmfn and 0.9 cm at 10 cmfn. Biopsy on 05/23/21 confirmed invasive ductal carcinoma to both sites. -left lumpectomy on 06/07/21 by Dr. Georgette Dover showed grade 3 IDC to both tumors (2.5 cm and 0.8 cm) with small foci of DCIS. Margins and lymph node were negative. -Oncotype Dx recurrence score of 29, high risk -she began adjuvant TC on 07/15/21.    2. Bone Health  -Her most recent DEXA was 06/08/20 showing -0.8 (normal) -I previously explained that  AI can decrease bone density.     PLAN:  -IVF today, 720m over 90 mins  -lab, f/u, and cycle 2 TC on 08/05/21 as scheduled, will add iv emend    No problem-specific Assessment & Plan notes found for this encounter.   SUMMARY OF ONCOLOGIC HISTORY: Oncology History Overview Note   Cancer Staging  Malignant neoplasm of upper-outer quadrant of left breast in female, estrogen receptor positive ([place] Staging form: Breast, AJCC 8th Edition - Clinical stage from 06/01/2021: Stage IIA (cT2, cN0, cM0, G3, ER+, PR+, HER2-) - Unsigned      Malignant neoplasm of upper-outer quadrant of left breast in female, estrogen receptor positive ([place]  05/02/2021 Mammogram   Exam: 3D Mammogram Diagnostic Bilateral IMPRESSION: Palpable 2.4 cm irregular high density mass in the upper-outer left breast.  Exam: Breast Ultrasound - Left IMPRESSION: 1. 2.3 cm irregular mass in left breast at 1 o'clock posterior depth, 13 mc from nipple. 2. 0.9 cm irregular mass in left breast at 1 o'clock posterior depth, 10 cm from nipple 3. Three small 3-4 mm round masses favored to represent small cysts seen at 2:30-3:00 11-12 cm from nipple. 4. No abnormal nodes in left axilla.   05/23/2021 Initial Biopsy   Diagnosis 1. Breast, left, needle core biopsy, breast mass, 2.3 cm @ 1:00 13 CMFN  - INVASIVE DUCTAL CARCINOMA, grade 3  2. Breast, left, needle core biopsy, breast mass, 0.9 cm @ 1:00 10 CMFN  - INVASIVE DUCTAL CARCINOMA, grade 1  1. PROGNOSTIC INDICATORS Results: The tumor cells are EQUIVOCAL for Her2 (2+) Estrogen Receptor: 100%, POSITIVE, STRONG STAINING INTENSITY Progesterone Receptor: 60%, POSITIVE, STRONG STAINING INTENSITY Proliferation Marker Ki67: 40%  1. FLUORESCENCE IN-SITU HYBRIDIZATION Results: GROUP 5: HER2 **NEGATIVE**  2. PROGNOSTIC INDICATORS Results: The tumor cells are NEGATIVE for Her2 (1+) Estrogen Receptor: 100%, POSITIVE, STRONG STAINING INTENSITY Progesterone Receptor: 100%,  POSITIVE, STRONG STAINING INTENSITY Proliferation Marker Ki67: 10%   05/30/2021 Initial Diagnosis   Malignant neoplasm of upper-outer quadrant of left breast in female, estrogen receptor positive ([place])   06/07/2021 Definitive Surgery   FINAL MICROSCOPIC DIAGNOSIS:   A. BREAST, LEFT, LUMPECTOMY:  Invasive ductal carcinomas (two independent lesions, 2.5 cm and 0.8 cm) with clear margins of resection.  Please see the synoptic report after specimen B.   B. LYMPH NODE, LEFT AXILLARY #1, SENTINEL, EXCISION:  Hyperplastic lymph node with fatty infiltration which is negative for  metastatic carcinoma.    06/07/2021 Oncotype testing   Oncotype DX was obtained on the final surgical sample and the recurrence score of 28 predicts a risk of recurrence outside the breast over the next 9 years of 18%, if the patient's only systemic therapy is an antiestrogen for 5 years.  It also predicts a significant benefit from chemotherapy.    07/15/2021 -  Chemotherapy   Patient is on Treatment Plan : BREAST TC q21d        INTERVAL HISTORY:  Christy Young is here for a follow up of breast cancer. She was last seen by me on 07/15/21. She presents to the clinic alone. She reports she is not feeling well today. She reports nausea, diarrhea, palpitations. She notes the diarrhea began last night.    All other systems were reviewed with the patient and are negative.  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  ALLERGIES:  is allergic to pravastatin, rosuvastatin, and penicillins.  MEDICATIONS:  Current Outpatient Medications  Medication Sig Dispense Refill   allopurinol (ZYLOPRIM) 100 MG tablet TAKE 2 TABLETS BY MOUTH EVERY DAY (Patient taking differently: Take 100 mg by mouth 2 (two) times daily.) 180 tablet 0   atorvastatin (LIPITOR) 40 MG tablet Take 1 tablet (40 mg total) by mouth daily. 90 tablet 3   dexamethasone (DECADRON) 4 MG tablet Take 1 tablet (4 mg total) by mouth 2 (two) times daily. Start the day before Taxotere. Then again the day after chemo for 3 days. 30 tablet  1   furosemide (LASIX) 20 MG tablet Take 1 tablet (20 mg total) by mouth daily. 60 tablet 3   HYDROcodone-acetaminophen (NORCO/VICODIN) 5-325 MG tablet Take 1 tablet by mouth every 6 (six) hours as needed for moderate pain. 15 tablet 0   Multiple Vitamins-Minerals (MULTIVITAMIN WITH MINERALS) tablet Take 1 tablet by mouth daily.     nebivolol (BYSTOLIC) 5 MG tablet Take 1 tablet (5 mg total) by mouth daily. 90 tablet 1   ondansetron (ZOFRAN) 8 MG tablet Take 1 tablet (8 mg total) by mouth 2 (two) times daily as needed for refractory nausea / vomiting. Start on day 3 after chemo. 30 tablet 1   prochlorperazine (COMPAZINE) 10 MG tablet Take 1 tablet (10 mg total) by mouth every 6 (six) hours as needed (Nausea or vomiting). 30 tablet 1   Vitamin D, Ergocalciferol,  (DRISDOL) 1.25 MG (50000 UNIT) CAPS capsule Take 1 capsule (50,000 Units total) by mouth every 7 (seven) days. 12 capsule 0   Current Facility-Administered Medications  Medication Dose Route Frequency Provider Last Rate Last Admin   0.9 %  sodium chloride infusion   Intravenous Continuous Truitt Merle, MD   Stopped at 07/21/21 1716    PHYSICAL EXAMINATION: ECOG PERFORMANCE STATUS: 2 - Symptomatic, <50% confined to bed  Vitals:   07/21/21 1518  BP: 116/70  Pulse: 96  Resp: (!) 187  Temp: 99.1 F (37.3 C)  SpO2: 94%   Wt Readings from Last 3 Encounters:  07/21/21 214 lb 6.4 oz (97.3 kg)  07/15/21 215 lb 9.6 oz (97.8 kg)  06/29/21 216 lb 9.6 oz (98.2 kg)     GENERAL:alert, no distress and comfortable SKIN: skin color, texture, turgor are normal, no rashes or significant lesions EYES: normal, Conjunctiva are pink and non-injected, sclera clear  LUNGS: clear to auscultation and percussion with normal breathing effort HEART: regular rate & rhythm and no murmurs and no lower extremity edema NEURO: alert & oriented x 3 with fluent speech, no focal motor/sensory deficits  LABORATORY DATA:  I have reviewed the data as listed CBC Latest Ref Rng & Units 07/21/2021 07/15/2021 06/01/2021  WBC 4.0 - 10.5 K/uL 3.1(L) 6.7 8.2  Hemoglobin 12.0 - 15.0 g/dL 12.6 13.2 13.5  Hematocrit 36.0 - 46.0 % 36.9 39.5 40.3  Platelets 150 - 400 K/uL 217 262 276     CMP Latest Ref Rng & Units 07/21/2021 07/15/2021 06/01/2021  Glucose 70 - 99 mg/dL 146(H) 101(H) 111(H)  BUN 8 - 23 mg/dL '13 14 18  ' Creatinine 0.44 - 1.00 mg/dL 0.85 0.73 0.86  Sodium 135 - 145 mmol/L 137 142 144  Potassium 3.5 - 5.1 mmol/L 3.8 3.8 3.9  Chloride 98 - 111 mmol/L 104 109 110  CO2 22 - 32 mmol/L '26 23 23  ' Calcium 8.9 - 10.3 mg/dL 10.2 10.7(H) 10.9(H)  Total Protein 6.5 - 8.1 g/dL 6.4(L) 7.2 7.1  Total Bilirubin 0.3 - 1.2 mg/dL 0.9 1.0 0.9  Alkaline Phos 38 - 126 U/L 62 59 64  AST 15 - 41 U/L 14(L) 15 17  ALT 0 - 44 U/L 19 21 34       RADIOGRAPHIC STUDIES: I have personally reviewed the radiological images as listed and agreed with the findings in the report. No results found.    No orders of the defined types were placed in this encounter.  All questions were answered. The patient knows to call the clinic with any problems, questions or concerns. No barriers to learning was detected. The  total time spent in the appointment was 25 minutes.     Truitt Merle, MD 07/21/2021   I, Wilburn Mylar, am acting as scribe for Truitt Merle, MD.   I have reviewed the above documentation for accuracy and completeness, and I agree with the above.

## 2021-07-21 NOTE — Patient Instructions (Signed)

## 2021-08-04 MED FILL — Dexamethasone Sodium Phosphate Inj 100 MG/10ML: INTRAMUSCULAR | Qty: 1 | Status: AC

## 2021-08-04 MED FILL — Fosaprepitant Dimeglumine For IV Infusion 150 MG (Base Eq): INTRAVENOUS | Qty: 5 | Status: AC

## 2021-08-05 ENCOUNTER — Encounter: Payer: Self-pay | Admitting: Hematology

## 2021-08-05 ENCOUNTER — Encounter: Payer: Self-pay | Admitting: *Deleted

## 2021-08-05 ENCOUNTER — Inpatient Hospital Stay: Payer: Medicare Other | Attending: Hematology

## 2021-08-05 ENCOUNTER — Inpatient Hospital Stay (HOSPITAL_BASED_OUTPATIENT_CLINIC_OR_DEPARTMENT_OTHER): Payer: Medicare Other | Admitting: Hematology

## 2021-08-05 ENCOUNTER — Inpatient Hospital Stay: Payer: Medicare Other

## 2021-08-05 ENCOUNTER — Other Ambulatory Visit: Payer: Self-pay

## 2021-08-05 VITALS — BP 146/84 | HR 95 | Temp 98.8°F | Wt 217.6 lb

## 2021-08-05 DIAGNOSIS — C50412 Malignant neoplasm of upper-outer quadrant of left female breast: Secondary | ICD-10-CM | POA: Insufficient documentation

## 2021-08-05 DIAGNOSIS — Z170 Estrogen receptor positive status [ER+]: Secondary | ICD-10-CM | POA: Diagnosis not present

## 2021-08-05 DIAGNOSIS — Z5189 Encounter for other specified aftercare: Secondary | ICD-10-CM | POA: Diagnosis not present

## 2021-08-05 DIAGNOSIS — Z5111 Encounter for antineoplastic chemotherapy: Secondary | ICD-10-CM | POA: Insufficient documentation

## 2021-08-05 LAB — CMP (CANCER CENTER ONLY)
ALT: 30 U/L (ref 0–44)
AST: 17 U/L (ref 15–41)
Albumin: 4.2 g/dL (ref 3.5–5.0)
Alkaline Phosphatase: 71 U/L (ref 38–126)
Anion gap: 11 (ref 5–15)
BUN: 18 mg/dL (ref 8–23)
CO2: 20 mmol/L — ABNORMAL LOW (ref 22–32)
Calcium: 11.4 mg/dL — ABNORMAL HIGH (ref 8.9–10.3)
Chloride: 107 mmol/L (ref 98–111)
Creatinine: 0.77 mg/dL (ref 0.44–1.00)
GFR, Estimated: >60 mL/min (ref >60)
Glucose, Bld: 130 mg/dL — ABNORMAL HIGH (ref 70–99)
Potassium: 4.1 mmol/L (ref 3.5–5.1)
Sodium: 138 mmol/L (ref 135–145)
Total Bilirubin: 0.7 mg/dL (ref 0.3–1.2)
Total Protein: 7 g/dL (ref 6.5–8.1)

## 2021-08-05 LAB — CBC WITH DIFFERENTIAL (CANCER CENTER ONLY)
Abs Immature Granulocytes: 0.09 10*3/uL — ABNORMAL HIGH (ref 0.00–0.07)
Basophils Absolute: 0 10*3/uL (ref 0.0–0.1)
Basophils Relative: 0 %
Eosinophils Absolute: 0 10*3/uL (ref 0.0–0.5)
Eosinophils Relative: 0 %
HCT: 36 % (ref 36.0–46.0)
Hemoglobin: 12 g/dL (ref 12.0–15.0)
Immature Granulocytes: 1 %
Lymphocytes Relative: 8 %
Lymphs Abs: 1.6 10*3/uL (ref 0.7–4.0)
MCH: 29.6 pg (ref 26.0–34.0)
MCHC: 33.3 g/dL (ref 30.0–36.0)
MCV: 88.7 fL (ref 80.0–100.0)
Monocytes Absolute: 0.9 10*3/uL (ref 0.1–1.0)
Monocytes Relative: 5 %
Neutro Abs: 17.1 10*3/uL — ABNORMAL HIGH (ref 1.7–7.7)
Neutrophils Relative %: 86 %
Platelet Count: 441 10*3/uL — ABNORMAL HIGH (ref 150–400)
RBC: 4.06 MIL/uL (ref 3.87–5.11)
RDW: 14.7 % (ref 11.5–15.5)
WBC Count: 19.7 10*3/uL — ABNORMAL HIGH (ref 4.0–10.5)
nRBC: 0.3 % — ABNORMAL HIGH (ref 0.0–0.2)

## 2021-08-05 MED ORDER — SODIUM CHLORIDE 0.9 % IV SOLN
600.0000 mg/m2 | Freq: Once | INTRAVENOUS | Status: AC
  Administered 2021-08-05: 1260 mg via INTRAVENOUS
  Filled 2021-08-05: qty 63

## 2021-08-05 MED ORDER — PALONOSETRON HCL INJECTION 0.25 MG/5ML
0.2500 mg | Freq: Once | INTRAVENOUS | Status: AC
  Administered 2021-08-05: 0.25 mg via INTRAVENOUS
  Filled 2021-08-05: qty 5

## 2021-08-05 MED ORDER — SODIUM CHLORIDE 0.9 % IV SOLN
150.0000 mg | Freq: Once | INTRAVENOUS | Status: AC
  Administered 2021-08-05: 150 mg via INTRAVENOUS
  Filled 2021-08-05: qty 150

## 2021-08-05 MED ORDER — SODIUM CHLORIDE 0.9 % IV SOLN
10.0000 mg | Freq: Once | INTRAVENOUS | Status: AC
  Administered 2021-08-05: 10 mg via INTRAVENOUS
  Filled 2021-08-05: qty 10

## 2021-08-05 MED ORDER — SODIUM CHLORIDE 0.9 % IV SOLN
75.0000 mg/m2 | Freq: Once | INTRAVENOUS | Status: AC
  Administered 2021-08-05: 160 mg via INTRAVENOUS
  Filled 2021-08-05: qty 16

## 2021-08-05 MED ORDER — SODIUM CHLORIDE 0.9 % IV SOLN: Freq: Once | INTRAVENOUS | Status: AC

## 2021-08-05 NOTE — Progress Notes (Signed)
[place]   Telephone:([phone] Fax:([phone]) [phone]   Clinic Follow up Note   Patient Care Team: [name], Standley Brooking, MD as PCP - General (Internal Medicine) Valinda Party, MD as Referring Physician (Rheumatology) Donnie Mesa, MD as Consulting Physician (General Surgery) Truitt Merle, MD as Consulting Physician (Hematology) Eppie Gibson, MD as Attending Physician (Radiation Oncology) Mauro Kaufmann, RN as Oncology Nurse Navigator Rockwell Germany, RN as Oncology Nurse Navigator Magnus Sinning, MD as Consulting Physician (Physical Medicine and Rehabilitation) Nigel Mormon, MD as Consulting Physician (Cardiology) Renato Shin, MD as Consulting Physician (Endocrinology)  Date of Service:  08/05/2021  CHIEF COMPLAINT: f/u of left breast cancer  CURRENT THERAPY:  Adjuvant TC q3weeks, starting 07/15/21  ASSESSMENT & PLAN:  Christy Young is a 76 y.o. female with   1. 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 B/l diagnostic MM and left [place] on 05/02/21 showed two masses in left breast at 1 o'clock, 2.3 cm at 13 cmfn and 0.9 cm at 10 cmfn. Biopsy on 05/23/21 confirmed invasive ductal carcinoma to both sites. -left lumpectomy on 06/07/21 by Dr. Georgette Dover showed grade 3 IDC to both tumors (2.5 cm and 0.8 cm) with small foci of DCIS. Margins and lymph node were negative. -Oncotype Dx recurrence score of 29, high risk -she began adjuvant TC on 07/15/21. She experienced nausea, fatigue, diarrhea, and palpitations. She was given IVF and was able to recover well. She does not feel she needs IVF this time; she knows what to watch for regarding dehydration. -She has recovered well from first cycle chemo, lab reviewed, adequate for treatment.   2. Bone Health  -Her most recent DEXA was 06/08/20 showing -0.8 (normal) -I previously  explained that AI can decrease bone density.     PLAN:  -proceed with C2 TC today  -Udenyca day 3 -lab, f/u, and TC in 3 and 6 weeks -She will call us if she needs IV fluids or any other concerns.   No problem-specific Assessment & Plan notes found for this encounter.   SUMMARY OF ONCOLOGIC HISTORY: Oncology History Overview Note   Cancer Staging  Malignant neoplasm of upper-outer quadrant of left breast in female, estrogen receptor positive ([place]) Staging form: Breast, AJCC 8th Edition - Clinical stage from 06/01/2021: Stage IIA (cT2, cN0, cM0, G3, ER+, PR+, HER2-) - Unsigned      Malignant neoplasm of upper-outer quadrant of left breast in female, estrogen receptor positive ([place])  05/02/2021 Mammogram   Exam: 3D Mammogram Diagnostic Bilateral IMPRESSION: Palpable 2.4 cm irregular high density mass in the upper-outer left breast.  Exam: Breast Ultrasound - Left IMPRESSION: 1. 2.3 cm irregular mass in left breast at 1 o'clock posterior depth, 13 mc from nipple. 2. 0.9 cm irregular mass in left breast at 1 o'clock posterior depth, 10 cm from nipple 3. Three small 3-4 mm round masses favored to represent small cysts seen at 2:30-3:00 11-12 cm from nipple. 4. No abnormal nodes in left axilla.   05/23/2021 Initial Biopsy   Diagnosis 1. Breast, left, needle core biopsy, breast mass, 2.3 cm @ 1:00 13 CMFN  - INVASIVE DUCTAL CARCINOMA, grade 3  2. Breast, left, needle core biopsy, breast mass, 0.9 cm @ 1:00 10 CMFN  - INVASIVE DUCTAL CARCINOMA, grade 1  1. PROGNOSTIC INDICATORS Results: The tumor cells are EQUIVOCAL for Her2 (2+) Estrogen Receptor: 100%, POSITIVE,  STRONG STAINING INTENSITY Progesterone Receptor: 60%, POSITIVE, STRONG STAINING INTENSITY Proliferation Marker Ki67: 40%  1. FLUORESCENCE IN-SITU HYBRIDIZATION Results: GROUP 5: HER2 **NEGATIVE**  2. PROGNOSTIC INDICATORS Results: The tumor cells are NEGATIVE for Her2 (1+) Estrogen Receptor: 100%, POSITIVE,  STRONG STAINING INTENSITY Progesterone Receptor: 100%, POSITIVE, STRONG STAINING INTENSITY Proliferation Marker Ki67: 10%   05/30/2021 Initial Diagnosis   Malignant neoplasm of upper-outer quadrant of left breast in female, estrogen receptor positive ([place])   06/07/2021 Definitive Surgery   FINAL MICROSCOPIC DIAGNOSIS:   A. BREAST, LEFT, LUMPECTOMY:  Invasive ductal carcinomas (two independent lesions, 2.5 cm and 0.8 cm) with clear margins of resection.  Please see the synoptic report after specimen B.   B. LYMPH NODE, LEFT AXILLARY #1, SENTINEL, EXCISION:  Hyperplastic lymph node with fatty infiltration which is negative for  metastatic carcinoma.    06/07/2021 Oncotype testing   Oncotype DX was obtained on the final surgical sample and the recurrence score of 28 predicts a risk of recurrence outside the breast over the next 9 years of 18%, if the patient's only systemic therapy is an antiestrogen for 5 years.  It also predicts a significant benefit from chemotherapy.    06/07/2021 Cancer Staging   Staging form: Breast, AJCC 8th Edition - Pathologic stage from 06/07/2021: Stage IB (pT2, pN0, cM0, G3, ER+, PR+, HER2-, Oncotype DX score: 29) - Signed by Truitt Merle, MD on 08/05/2021 Stage prefix: Initial diagnosis Multigene prognostic tests performed: Oncotype DX Recurrence score range: Greater than or equal to 11 Histologic grading system: 3 grade system    07/15/2021 -  Chemotherapy   Patient is on Treatment Plan : BREAST TC q21d        INTERVAL HISTORY:  Christy Young is here for a follow up of breast cancer. She was last seen by me on 07/21/21. She presents to the clinic alone. She reports she has recovered well in her second week. She adds that the IVF were helpful. She notes she now knows what to watch for and how to manage at home. She notes her hair began falling out, and she ultimately shaved the rest.    All other systems were reviewed with the patient and are  negative.  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  ALLERGIES:  is allergic to pravastatin, rosuvastatin, and penicillins.  MEDICATIONS:  Current Outpatient Medications  Medication Sig Dispense Refill   allopurinol (ZYLOPRIM) 100 MG tablet TAKE 2 TABLETS BY MOUTH EVERY DAY (Patient taking differently: Take 100 mg by mouth 2 (two) times daily.) 180 tablet 0   atorvastatin (LIPITOR) 40 MG tablet Take 1 tablet (40 mg total) by mouth daily. 90 tablet 3   dexamethasone (DECADRON) 4 MG tablet Take 1 tablet (4 mg total) by mouth 2 (two) times daily. Start the day before Taxotere. Then again the day after chemo for 3 days. 30 tablet 1   furosemide (LASIX) 20 MG tablet Take 1 tablet (20 mg total) by mouth daily. 60 tablet 3   HYDROcodone-acetaminophen (NORCO/VICODIN) 5-325 MG tablet Take 1 tablet by mouth every 6 (six) hours as needed for moderate  pain. 15 tablet 0   Multiple Vitamins-Minerals (MULTIVITAMIN WITH MINERALS) tablet Take 1 tablet by mouth daily.     nebivolol (BYSTOLIC) 5 MG tablet Take 1 tablet (5 mg total) by mouth daily. 90 tablet 1   ondansetron (ZOFRAN) 8 MG tablet Take 1 tablet (8 mg total) by mouth 2 (two) times daily as needed for refractory nausea / vomiting. Start on day 3 after chemo. 30 tablet 1   prochlorperazine (COMPAZINE) 10 MG tablet Take 1 tablet (10 mg total) by mouth every 6 (six) hours as needed (Nausea or vomiting). 30 tablet 1   Vitamin D, Ergocalciferol, (DRISDOL) 1.25 MG (50000 UNIT) CAPS capsule Take 1 capsule (50,000 Units total) by mouth every 7 (seven) days. 12 capsule 0   No current facility-administered medications for this visit.    PHYSICAL EXAMINATION: ECOG PERFORMANCE STATUS: 1 - Symptomatic but completely ambulatory  Vitals:   08/05/21 1033  BP: (!) 146/84  Pulse: 95  Temp: 98.8 F (37.1 C)  SpO2: 99%   Wt Readings from Last 3 Encounters:  08/05/21 217 lb 9 oz (98.7 kg)  07/21/21 214 lb 6.4 oz (97.3 kg)  07/15/21 215 lb 9.6 oz (97.8 kg)     GENERAL:alert, no distress and comfortable SKIN: skin color normal, no rashes or significant lesions EYES: normal, Conjunctiva are pink and non-injected, sclera clear  NEURO: alert & oriented x 3 with fluent speech  LABORATORY DATA:  I have reviewed the data as listed CBC Latest Ref Rng & Units 08/05/2021 07/21/2021 07/15/2021  WBC 4.0 - 10.5 K/uL 19.7(H) 3.1(L) 6.7  Hemoglobin 12.0 - 15.0 g/dL 12.0 12.6 13.2  Hematocrit 36.0 - 46.0 % 36.0 36.9 39.5  Platelets 150 - 400 K/uL 441(H) 217 262     CMP Latest Ref Rng & Units 08/05/2021 07/21/2021 07/15/2021  Glucose 70 - 99 mg/dL 130(H) 146(H) 101(H)  BUN 8 - 23 mg/dL _0 Creatinine 0.44 - 1.00 mg/dL 0.77 0.85 0.73  Sodium 135 - 145 mmol/L 138 137 142  Potassium 3.5 - 5.1 mmol/L 4.1 3.8 3.8  Chloride 98 - 111 mmol/L 107 104 109  CO2 22 - 32 mmol/L 20(L) 26 23  Calcium 8.9 - 10.3  mg/dL 11.4(H) 10.2 10.7(H)  Total Protein 6.5 - 8.1 g/dL 7.0 6.4(L) 7.2  Total Bilirubin 0.3 - 1.2 mg/dL 0.7 0.9 1.0  Alkaline Phos 38 - 126 U/L 71 62 59  AST 15 - 41 U/L 17 14(L) 15  ALT 0 - 44 U/L _1 RADIOGRAPHIC STUDIES: I have personally reviewed the radiological images as listed and agreed with the findings in  the report. No results found.    No orders of the defined types were placed in this encounter.  All questions were answered. The patient knows to call the clinic with any problems, questions or concerns. No barriers to learning was detected. The total time spent in the appointment was 30 minutes.     Truitt Merle, MD 08/05/2021   I, Wilburn Mylar, am acting as scribe for Truitt Merle, MD.   I have reviewed the above documentation for accuracy and completeness, and I agree with the above.

## 2021-08-05 NOTE — Patient Instructions (Signed)
[facility]  Discharge Instructions: Thank you for choosing [facility] to provide your oncology and hematology care.   If you have a lab appointment with the [facility], please go directly to the [facility] and check in at the registration area.   Wear comfortable clothing and clothing appropriate for easy access to any Portacath or PICC line.   We strive to give you quality time with your provider. You may need to reschedule your appointment if you arrive late (15 or more minutes).  Arriving late affects you and other patients whose appointments are after yours.  Also, if you miss three or more appointments without notifying the office, you may be dismissed from the clinic at the providers discretion.      For prescription refill requests, have your pharmacy contact our office and allow 72 hours for refills to be completed.    Today you received the following chemotherapy and/or immunotherapy agents: Taxotere/Cytoxan      To help prevent nausea and vomiting after your treatment, we encourage you to take your nausea medication as directed.  BELOW ARE SYMPTOMS THAT SHOULD BE REPORTED IMMEDIATELY: *FEVER GREATER THAN 100.4 F (38 C) OR HIGHER *CHILLS OR SWEATING *NAUSEA AND VOMITING THAT IS NOT CONTROLLED WITH YOUR NAUSEA MEDICATION *UNUSUAL SHORTNESS OF BREATH *UNUSUAL BRUISING OR BLEEDING *URINARY PROBLEMS (pain or burning when urinating, or frequent urination) *BOWEL PROBLEMS (unusual diarrhea, constipation, pain near the anus) TENDERNESS IN MOUTH AND THROAT WITH OR WITHOUT PRESENCE OF ULCERS (sore throat, sores in mouth, or a toothache) UNUSUAL RASH, SWELLING OR PAIN  UNUSUAL VAGINAL DISCHARGE OR ITCHING   Items with * indicate a potential emergency and should be followed up as soon as possible or go to the Emergency Department if any problems should occur.  Please show the CHEMOTHERAPY ALERT CARD or IMMUNOTHERAPY ALERT CARD at  check-in to the Emergency Department and triage nurse.  Should you have questions after your visit or need to cancel or reschedule your appointment, please contact [facility]  Dept: [phone]  and follow the prompts.  Office hours are 8:00 a.m. to 4:30 p.m. Monday - Friday. Please note that voicemails left after 4:00 p.m. may not be returned until the following business day.  We are closed weekends and major holidays. You have access to a nurse at all times for urgent questions. Please call the main number to the clinic Dept: [phone] and follow the prompts.   For any non-urgent questions, you may also contact your provider using MyChart. We now offer e-Visits for anyone 75 and older to request care online for non-urgent symptoms. For details visit [email or web address].   Also download the MyChart app! Go to the app store, search "MyChart", open the app, select [facility], and log in with your MyChart username and password.  Due to Covid, a mask is required upon entering the hospital/clinic. If you do not have a mask, one will be given to you upon arrival. For doctor visits, patients may have 1 support person aged 33 or older with them. For treatment visits, patients cannot have anyone with them due to current Covid guidelines and our immunocompromised population.

## 2021-08-08 ENCOUNTER — Other Ambulatory Visit: Payer: Self-pay

## 2021-08-08 ENCOUNTER — Inpatient Hospital Stay: Payer: Medicare Other

## 2021-08-08 VITALS — BP 116/72 | HR 85 | Temp 99.2°F | Resp 20

## 2021-08-08 DIAGNOSIS — C50412 Malignant neoplasm of upper-outer quadrant of left female breast: Secondary | ICD-10-CM | POA: Diagnosis not present

## 2021-08-08 DIAGNOSIS — Z170 Estrogen receptor positive status [ER+]: Secondary | ICD-10-CM | POA: Diagnosis not present

## 2021-08-08 DIAGNOSIS — Z5189 Encounter for other specified aftercare: Secondary | ICD-10-CM | POA: Diagnosis not present

## 2021-08-08 DIAGNOSIS — Z5111 Encounter for antineoplastic chemotherapy: Secondary | ICD-10-CM | POA: Diagnosis not present

## 2021-08-08 MED ORDER — PEGFILGRASTIM-CBQV 6 MG/0.6ML ~~LOC~~ SOSY
6.0000 mg | PREFILLED_SYRINGE | Freq: Once | SUBCUTANEOUS | Status: AC
  Administered 2021-08-08: 6 mg via SUBCUTANEOUS
  Filled 2021-08-08: qty 0.6

## 2021-08-08 NOTE — Patient Instructions (Signed)

## 2021-08-12 ENCOUNTER — Encounter: Payer: Self-pay | Admitting: *Deleted

## 2021-08-12 DIAGNOSIS — C50412 Malignant neoplasm of upper-outer quadrant of left female breast: Secondary | ICD-10-CM

## 2021-08-16 ENCOUNTER — Other Ambulatory Visit: Payer: Self-pay | Admitting: Radiation Oncology

## 2021-08-16 ENCOUNTER — Inpatient Hospital Stay
Admission: RE | Admit: 2021-08-16 | Discharge: 2021-08-16 | Disposition: A | Discharge status: Home / Self Care | Payer: Self-pay | Source: Ambulatory Visit | Attending: Radiation Oncology | Admitting: Radiation Oncology

## 2021-08-16 ENCOUNTER — Ambulatory Visit
Admission: RE | Admit: 2021-08-16 | Discharge: 2021-08-16 | Disposition: A | Discharge status: Home / Self Care | Payer: Self-pay | Source: Ambulatory Visit | Attending: Radiation Oncology | Admitting: Radiation Oncology

## 2021-08-16 DIAGNOSIS — C50412 Malignant neoplasm of upper-outer quadrant of left female breast: Secondary | ICD-10-CM

## 2021-08-16 DIAGNOSIS — Z170 Estrogen receptor positive status [ER+]: Secondary | ICD-10-CM

## 2021-08-22 ENCOUNTER — Encounter: Payer: Self-pay | Admitting: Hematology

## 2021-08-23 ENCOUNTER — Encounter: Payer: Self-pay | Admitting: Hematology

## 2021-08-25 MED FILL — Fosaprepitant Dimeglumine For IV Infusion 150 MG (Base Eq): INTRAVENOUS | Qty: 5 | Status: AC

## 2021-08-25 MED FILL — Dexamethasone Sodium Phosphate Inj 100 MG/10ML: INTRAMUSCULAR | Qty: 1 | Status: AC

## 2021-08-26 ENCOUNTER — Encounter: Payer: Self-pay | Admitting: Hematology

## 2021-08-26 ENCOUNTER — Inpatient Hospital Stay: Payer: Medicare Other

## 2021-08-26 ENCOUNTER — Other Ambulatory Visit: Payer: Self-pay

## 2021-08-26 ENCOUNTER — Inpatient Hospital Stay (HOSPITAL_BASED_OUTPATIENT_CLINIC_OR_DEPARTMENT_OTHER): Payer: Medicare Other | Admitting: Hematology

## 2021-08-26 ENCOUNTER — Inpatient Hospital Stay: Payer: Medicare Other | Attending: Hematology

## 2021-08-26 VITALS — BP 139/78 | HR 98 | Temp 98.5°F | Resp 19 | Ht 64.0 in | Wt 220.1 lb

## 2021-08-26 DIAGNOSIS — Z170 Estrogen receptor positive status [ER+]: Secondary | ICD-10-CM

## 2021-08-26 DIAGNOSIS — Z5189 Encounter for other specified aftercare: Secondary | ICD-10-CM | POA: Diagnosis not present

## 2021-08-26 DIAGNOSIS — C50412 Malignant neoplasm of upper-outer quadrant of left female breast: Secondary | ICD-10-CM

## 2021-08-26 DIAGNOSIS — Z5111 Encounter for antineoplastic chemotherapy: Secondary | ICD-10-CM | POA: Diagnosis not present

## 2021-08-26 LAB — CMP (CANCER CENTER ONLY)
ALT: 23 U/L (ref 0–44)
AST: 15 U/L (ref 15–41)
Albumin: 3.9 g/dL (ref 3.5–5.0)
Alkaline Phosphatase: 62 U/L (ref 38–126)
Anion gap: 12 (ref 5–15)
BUN: 15 mg/dL (ref 8–23)
CO2: 19 mmol/L — ABNORMAL LOW (ref 22–32)
Calcium: 11 mg/dL — ABNORMAL HIGH (ref 8.9–10.3)
Chloride: 108 mmol/L (ref 98–111)
Creatinine: 0.72 mg/dL (ref 0.44–1.00)
GFR, Estimated: >60 mL/min (ref >60)
Glucose, Bld: 169 mg/dL — ABNORMAL HIGH (ref 70–99)
Potassium: 4.1 mmol/L (ref 3.5–5.1)
Sodium: 139 mmol/L (ref 135–145)
Total Bilirubin: 0.6 mg/dL (ref 0.3–1.2)
Total Protein: 6.3 g/dL — ABNORMAL LOW (ref 6.5–8.1)

## 2021-08-26 LAB — CBC WITH DIFFERENTIAL (CANCER CENTER ONLY)
Abs Immature Granulocytes: 0.06 10*3/uL (ref 0.00–0.07)
Basophils Absolute: 0 10*3/uL (ref 0.0–0.1)
Basophils Relative: 0 %
Eosinophils Absolute: 0 10*3/uL (ref 0.0–0.5)
Eosinophils Relative: 0 %
HCT: 33.8 % — ABNORMAL LOW (ref 36.0–46.0)
Hemoglobin: 11 g/dL — ABNORMAL LOW (ref 12.0–15.0)
Immature Granulocytes: 0 %
Lymphocytes Relative: 8 %
Lymphs Abs: 1.1 10*3/uL (ref 0.7–4.0)
MCH: 29.6 pg (ref 26.0–34.0)
MCHC: 32.5 g/dL (ref 30.0–36.0)
MCV: 90.9 fL (ref 80.0–100.0)
Monocytes Absolute: 0.7 10*3/uL (ref 0.1–1.0)
Monocytes Relative: 5 %
Neutro Abs: 12 10*3/uL — ABNORMAL HIGH (ref 1.7–7.7)
Neutrophils Relative %: 87 %
Platelet Count: 336 10*3/uL (ref 150–400)
RBC: 3.72 MIL/uL — ABNORMAL LOW (ref 3.87–5.11)
RDW: 17 % — ABNORMAL HIGH (ref 11.5–15.5)
WBC Count: 13.8 10*3/uL — ABNORMAL HIGH (ref 4.0–10.5)
nRBC: 0.1 % (ref 0.0–0.2)

## 2021-08-26 MED ORDER — SODIUM CHLORIDE 0.9 % IV SOLN
10.0000 mg | Freq: Once | INTRAVENOUS | Status: AC
  Administered 2021-08-26: 10 mg via INTRAVENOUS
  Filled 2021-08-26: qty 10

## 2021-08-26 MED ORDER — SODIUM CHLORIDE 0.9 % IV SOLN: Freq: Once | INTRAVENOUS | Status: AC

## 2021-08-26 MED ORDER — PALONOSETRON HCL INJECTION 0.25 MG/5ML
0.2500 mg | Freq: Once | INTRAVENOUS | Status: AC
  Administered 2021-08-26: 0.25 mg via INTRAVENOUS
  Filled 2021-08-26: qty 5

## 2021-08-26 MED ORDER — SODIUM CHLORIDE 0.9 % IV SOLN
450.0000 mg/m2 | Freq: Once | INTRAVENOUS | Status: AC
  Administered 2021-08-26: 940 mg via INTRAVENOUS
  Filled 2021-08-26: qty 47

## 2021-08-26 MED ORDER — SODIUM CHLORIDE 0.9 % IV SOLN
60.0000 mg/m2 | Freq: Once | INTRAVENOUS | Status: AC
  Administered 2021-08-26: 130 mg via INTRAVENOUS
  Filled 2021-08-26: qty 13

## 2021-08-26 MED ORDER — SODIUM CHLORIDE 0.9 % IV SOLN
150.0000 mg | Freq: Once | INTRAVENOUS | Status: AC
  Administered 2021-08-26: 150 mg via INTRAVENOUS
  Filled 2021-08-26: qty 150

## 2021-08-26 NOTE — Patient Instructions (Signed)
[facility]  Discharge Instructions: ?Thank you for choosing [facility] to provide your oncology and hematology care.  ? ?If you have a lab appointment with the [facility], please go directly to the [facility] and check in at the registration area. ?  ?Wear comfortable clothing and clothing appropriate for easy access to any Portacath or PICC line.  ? ?We strive to give you quality time with your provider. You may need to reschedule your appointment if you arrive late (15 or more minutes).  Arriving late affects you and other patients whose appointments are after yours.  Also, if you miss three or more appointments without notifying the office, you may be dismissed from the clinic at the provider?s discretion.    ?  ?For prescription refill requests, have your pharmacy contact our office and allow 72 hours for refills to be completed.   ? ?Today you received the following chemotherapy and/or immunotherapy agents docetaxel, cytoxan    ?  ?To help prevent nausea and vomiting after your treatment, we encourage you to take your nausea medication as directed. ? ?BELOW ARE SYMPTOMS THAT SHOULD BE REPORTED IMMEDIATELY: ?*FEVER GREATER THAN 100.4 F (38 ?C) OR HIGHER ?*CHILLS OR SWEATING ?*NAUSEA AND VOMITING THAT IS NOT CONTROLLED WITH YOUR NAUSEA MEDICATION ?*UNUSUAL SHORTNESS OF BREATH ?*UNUSUAL BRUISING OR BLEEDING ?*URINARY PROBLEMS (pain or burning when urinating, or frequent urination) ?*BOWEL PROBLEMS (unusual diarrhea, constipation, pain near the anus) ?TENDERNESS IN MOUTH AND THROAT WITH OR WITHOUT PRESENCE OF ULCERS (sore throat, sores in mouth, or a toothache) ?UNUSUAL RASH, SWELLING OR PAIN  ?UNUSUAL VAGINAL DISCHARGE OR ITCHING  ? ?Items with * indicate a potential emergency and should be followed up as soon as possible or go to the Emergency Department if any problems should occur. ? ?Please show the CHEMOTHERAPY ALERT CARD or IMMUNOTHERAPY ALERT CARD at  check-in to the Emergency Department and triage nurse. ? ?Should you have questions after your visit or need to cancel or reschedule your appointment, please contact [facility]  Dept: [phone]  and follow the prompts.  Office hours are 8:00 a.m. to 4:30 p.m. Monday - Friday. Please note that voicemails left after 4:00 p.m. may not be returned until the following business day.  We are closed weekends and major holidays. You have access to a nurse at all times for urgent questions. Please call the main number to the clinic Dept: [phone] and follow the prompts. ? ? ?For any non-urgent questions, you may also contact your provider using MyChart. We now offer e-Visits for anyone 14 and older to request care online for non-urgent symptoms. For details visit [email or web address]. ?  ?Also download the MyChart app! Go to the app store, search "MyChart", open the app, select [facility], and log in with your MyChart username and password. ? ?Due to Covid, a mask is required upon entering the hospital/clinic. If you do not have a mask, one will be given to you upon arrival. For doctor visits, patients may have 1 support person aged 40 or older with them. For treatment visits, patients cannot have anyone with them due to current Covid guidelines and our immunocompromised population.  ? ?

## 2021-08-26 NOTE — Progress Notes (Signed)
[facility]   Telephone:([phone]) [phone] [phone]   Clinic Follow up Note   Patient Care Team: [name], Standley Brooking, MD as PCP - General (Internal Medicine) Valinda Party, MD as Referring Physician (Rheumatology) Donnie Mesa, MD as Consulting Physician (General Surgery) Truitt Merle, MD as Consulting Physician (Hematology) Eppie Gibson, MD as Attending Physician (Radiation Oncology) Mauro Kaufmann, RN as Oncology Nurse Navigator Rockwell Germany, RN as Oncology Nurse Navigator Magnus Sinning, MD as Consulting Physician (Physical Medicine and Rehabilitation) Nigel Mormon, MD as Consulting Physician (Cardiology) Renato Shin, MD as Consulting Physician (Endocrinology)  Date of Service:  08/26/2021  CHIEF COMPLAINT: f/u of left breast cancer  CURRENT THERAPY:  Adjuvant TC q3weeks, starting 07/15/21  ASSESSMENT & PLAN:  Christy Young is a 76 y.o. female with   1. 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 Biopsy on 05/23/21 confirmed invasive ductal carcinoma to both sites. -left lumpectomy on 06/07/21 by Dr. Georgette Dover showed grade 3 IDC to both tumors (2.5 cm and 0.8 cm) with small foci of DCIS. Margins and lymph node were negative. -Oncotype Dx recurrence score of 29, high risk -she began adjuvant TC on 07/15/21. She experienced nausea, fatigue, diarrhea, and palpitations. She was given IVF and was able to recover well.  -s/p C2 she experienced left eye vision changes, a large rash/bruise to her forearm from infusion, peeling to her hands. She has recovered well. I will reduce her dose today. If she continues to experience difficult side effects, I may cancel her cycle 4.   2. Bone Health  -Her most recent DEXA was 06/08/20 showing -0.8 (normal) -I previously explained that AI can decrease bone density.     PLAN:   -proceed with C3 TC today with dose reduction              -Udenyca day 3 -lab, f/u, and TC in 3 weeks -She will call us if she needs IV fluids or any other concerns.   No problem-specific Assessment & Plan notes found for this encounter.   SUMMARY OF ONCOLOGIC HISTORY: Oncology History Overview Note   Cancer Staging  Malignant neoplasm of upper-outer quadrant of left breast in female, estrogen receptor positive ([place]) Staging form: Breast, AJCC 8th Edition - Clinical stage from 06/01/2021: Stage IIA (cT2, cN0, cM0, G3, ER+, PR+, HER2-) - Unsigned      Malignant neoplasm of upper-outer quadrant of left breast in female, estrogen receptor positive ([place])  05/02/2021 Mammogram   Exam: 3D Mammogram Diagnostic Bilateral IMPRESSION: Palpable 2.4 cm irregular high density mass in the upper-outer left breast.  Exam: Breast Ultrasound - Left IMPRESSION: 1. 2.3 cm irregular mass in left breast at 1 o'clock posterior depth, 13 mc from nipple. 2. 0.9 cm irregular mass in left breast at 1 o'clock posterior depth, 10 cm from nipple 3. Three small 3-4 mm round masses favored to represent small cysts seen at 2:30-3:00 11-12 cm from nipple. 4. No abnormal nodes in left axilla.   05/23/2021 Initial Biopsy   Diagnosis 1. Breast, left, needle core biopsy, breast mass, 2.3 cm @ 1:00 13 CMFN  - INVASIVE DUCTAL CARCINOMA, grade 3  2. Breast, left, needle core biopsy, breast mass, 0.9 cm @ 1:00 10 CMFN  - INVASIVE DUCTAL CARCINOMA, grade 1  1. PROGNOSTIC INDICATORS Results: The tumor cells are EQUIVOCAL for Her2 (2+) Estrogen Receptor: 100%, POSITIVE,  STRONG STAINING INTENSITY Progesterone Receptor: 60%, POSITIVE, STRONG STAINING INTENSITY Proliferation Marker Ki67: 40%  1. FLUORESCENCE IN-SITU HYBRIDIZATION Results: GROUP 5: HER2 **NEGATIVE**  2. PROGNOSTIC INDICATORS Results: The tumor cells are NEGATIVE for Her2 (1+) Estrogen Receptor: 100%, POSITIVE, STRONG STAINING  INTENSITY Progesterone Receptor: 100%, POSITIVE, STRONG STAINING INTENSITY Proliferation Marker Ki67: 10%   05/30/2021 Initial Diagnosis   Malignant neoplasm of upper-outer quadrant of left breast in female, estrogen receptor positive ([place])   06/07/2021 Definitive Surgery   FINAL MICROSCOPIC DIAGNOSIS:   A. BREAST, LEFT, LUMPECTOMY:  Invasive ductal carcinomas (two independent lesions, 2.5 cm and 0.8 cm) with clear margins of resection.  Please see the synoptic report after specimen B.   B. LYMPH NODE, LEFT AXILLARY #1, SENTINEL, EXCISION:  Hyperplastic lymph node with fatty infiltration which is negative for  metastatic carcinoma.    06/07/2021 Oncotype testing   Oncotype DX was obtained on the final surgical sample and the recurrence score of 28 predicts a risk of recurrence outside the breast over the next 9 years of 18%, if the patient's only systemic therapy is an antiestrogen for 5 years.  It also predicts a significant benefit from chemotherapy.    06/07/2021 Cancer Staging   Staging form: Breast, AJCC 8th Edition - Pathologic stage from 06/07/2021: Stage IB (pT2, pN0, cM0, G3, ER+, PR+, HER2-, Oncotype DX score: 29) - Signed by Truitt Merle, MD on 08/05/2021 Stage prefix: Initial diagnosis Multigene prognostic tests performed: Oncotype DX Recurrence score range: Greater than or equal to 11 Histologic grading system: 3 grade system    07/15/2021 -  Chemotherapy   Patient is on Treatment Plan : BREAST TC q21d        INTERVAL HISTORY:  Christy Young is here for a follow up of breast cancer. She was last seen by me on 08/05/21. She presents to the clinic alone. She reports treatment is getting harder. She reports she had a rash/bruise to her arm from the infusion. She notes she didn't contact us because it got better on its own. She also reports some vision changes in her left eye following the second treatment. She notes she started to receive injections to her back.   All  other systems were reviewed with the patient and are negative.  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  ALLERGIES:  is allergic to pravastatin, rosuvastatin, and penicillins.  MEDICATIONS:  Current Outpatient Medications  Medication Sig Dispense Refill   allopurinol (ZYLOPRIM) 100 MG tablet TAKE 2 TABLETS BY MOUTH EVERY DAY (Patient taking differently: Take 100 mg by mouth 2 (two) times daily.) 180 tablet 0   atorvastatin (LIPITOR) 40 MG tablet Take 1 tablet (40 mg total) by mouth daily. 90 tablet 3   dexamethasone (DECADRON) 4 MG tablet Take 1 tablet (4 mg total) by mouth 2 (two) times daily. Start the day before Taxotere. Then again the day after chemo for 3 days. 30 tablet 1   furosemide (LASIX) 20 MG tablet Take 1 tablet (20 mg total) by mouth daily. 60 tablet 3   HYDROcodone-acetaminophen (NORCO/VICODIN) 5-325 MG tablet Take 1 tablet by  mouth every 6 (six) hours as needed for moderate pain. 15 tablet 0   Multiple Vitamins-Minerals (MULTIVITAMIN WITH MINERALS) tablet Take 1 tablet by mouth daily.     nebivolol (BYSTOLIC) 5 MG tablet Take 1 tablet (5 mg total) by mouth daily. 90 tablet 1   ondansetron (ZOFRAN) 8 MG tablet Take 1 tablet (8 mg total) by mouth 2 (two) times daily as needed for refractory nausea / vomiting. Start on day 3 after chemo. 30 tablet 1   prochlorperazine (COMPAZINE) 10 MG tablet Take 1 tablet (10 mg total) by mouth every 6 (six) hours as needed (Nausea or vomiting). 30 tablet 1   Vitamin D, Ergocalciferol, (DRISDOL) 1.25 MG (50000 UNIT) CAPS capsule Take 1 capsule (50,000 Units total) by mouth every 7 (seven) days. 12 capsule 0   No current facility-administered medications for this visit.    PHYSICAL EXAMINATION: ECOG PERFORMANCE STATUS: 1 - Symptomatic but completely ambulatory  Vitals:   08/26/21 1026  BP: 139/78  Pulse: 98  Resp: 19  Temp: 98.5 F (36.9 C)  SpO2: 98%   Wt Readings from Last 3 Encounters:  08/26/21 220 lb 1.6 oz (99.8 kg)  08/05/21 217 lb 9 oz (98.7 kg)  07/21/21 214 lb 6.4 oz (97.3 kg)     GENERAL:alert, no distress and comfortable SKIN: skin color normal, no rashes or significant lesions EYES: normal, Conjunctiva are pink and non-injected, sclera clear  NEURO: alert & oriented x 3 with fluent speech  LABORATORY DATA:  I have reviewed the data as listed CBC Latest Ref Rng & Units 08/26/2021 08/05/2021 07/21/2021  WBC 4.0 - 10.5 K/uL 13.8(H) 19.7(H) 3.1(L)  Hemoglobin 12.0 - 15.0 g/dL 11.0(L) 12.0 12.6  Hematocrit 36.0 - 46.0 % 33.8(L) 36.0 36.9  Platelets 150 - 400 K/uL 336 441(H) 217     CMP Latest Ref Rng & Units 08/26/2021 08/05/2021 07/21/2021  Glucose 70 - 99 mg/dL 169(H) 130(H) 146(H)  BUN 8 - 23 mg/dL _0 Creatinine 0.44 - 1.00 mg/dL 0.72 0.77 0.85  Sodium 135 - 145 mmol/L 139 138 137  Potassium 3.5 - 5.1 mmol/L 4.1 4.1 3.8  Chloride 98 - 111 mmol/L 108  107 104  CO2 22 - 32 mmol/L 19(L) 20(L) 26  Calcium 8.9 - 10.3 mg/dL 11.0(H) 11.4(H) 10.2  Total Protein 6.5 - 8.1 g/dL 6.3(L) 7.0 6.4(L)  Total Bilirubin 0.3 - 1.2 mg/dL 0.6 0.7 0.9  Alkaline Phos 38 - 126 U/L 62 71 62  AST 15 - 41 U/L 15 17 14(L)  ALT 0 - 44 U/L _1 RADIOGRAPHIC STUDIES: I have personally reviewed  the radiological images as listed and agreed with the findings in the report. No results found.    No orders of the defined types were placed in this encounter.  All questions were answered. The patient knows to call the clinic with any problems, questions or concerns. No barriers to learning was detected. The total time spent in the appointment was 30 minutes.     Truitt Merle, MD 08/26/2021   I, Wilburn Mylar, am acting as scribe for Truitt Merle, MD.   I have reviewed the above documentation for accuracy and completeness, and I agree with the above.

## 2021-08-29 ENCOUNTER — Inpatient Hospital Stay (HOSPITAL_BASED_OUTPATIENT_CLINIC_OR_DEPARTMENT_OTHER): Payer: Medicare Other

## 2021-08-29 ENCOUNTER — Other Ambulatory Visit: Payer: Self-pay

## 2021-08-29 VITALS — BP 109/69 | HR 96 | Temp 98.8°F | Resp 20

## 2021-08-29 DIAGNOSIS — C50412 Malignant neoplasm of upper-outer quadrant of left female breast: Secondary | ICD-10-CM | POA: Diagnosis not present

## 2021-08-29 DIAGNOSIS — Z5189 Encounter for other specified aftercare: Secondary | ICD-10-CM | POA: Diagnosis not present

## 2021-08-29 DIAGNOSIS — Z5111 Encounter for antineoplastic chemotherapy: Secondary | ICD-10-CM | POA: Diagnosis not present

## 2021-08-29 DIAGNOSIS — Z170 Estrogen receptor positive status [ER+]: Secondary | ICD-10-CM | POA: Diagnosis not present

## 2021-08-29 MED ORDER — PEGFILGRASTIM-CBQV 6 MG/0.6ML ~~LOC~~ SOSY
6.0000 mg | PREFILLED_SYRINGE | Freq: Once | SUBCUTANEOUS | Status: AC
  Administered 2021-08-29: 6 mg via SUBCUTANEOUS
  Filled 2021-08-29: qty 0.6

## 2021-08-29 NOTE — Progress Notes (Signed)
Pt has rash like area on right lower arm. This LPN notified Dr. Burr Medico. MD assessed area and states this may be related to a past IV that was placed or from infusion. MD has no current concerns and stated for pt to ice the area. Pt verbalized understanding.  ?

## 2021-08-30 ENCOUNTER — Other Ambulatory Visit: Payer: Self-pay | Admitting: Internal Medicine

## 2021-08-31 ENCOUNTER — Encounter (HOSPITAL_COMMUNITY): Payer: Self-pay

## 2021-09-05 ENCOUNTER — Other Ambulatory Visit: Payer: Self-pay

## 2021-09-05 ENCOUNTER — Ambulatory Visit: Payer: Medicare Other | Attending: Surgery

## 2021-09-05 VITALS — Wt 217.4 lb

## 2021-09-05 DIAGNOSIS — Z483 Aftercare following surgery for neoplasm: Secondary | ICD-10-CM | POA: Insufficient documentation

## 2021-09-05 NOTE — Therapy (Signed)
?[facility] ?[place], [place], [address] ?Phone: [phone]   Fax:  [phone] ? ?Physical Therapy Treatment ? ?Patient Details  ?Name: Christy Young ?MRN: [id number] ?Date of Birth: 01-26-1946 ?Referring Provider (PT): Dr. Donnie Mesa ? ? ?Encounter Date: 09/05/2021 ? ? PT End of Session - 09/05/21 0931   ? ? Visit Number 2   screen only  ? Number of Visits 2   ? PT Start Time [phone]   ? PT Stop Time 0940   ? PT Time Calculation (min) 9 min   ? Activity Tolerance Patient tolerated treatment well   ? Behavior During Therapy [facility] for tasks assessed/performed   ? ?  ?  ? ?  ? ? ?Past Medical History:  ?Diagnosis Date  ? ARTHRITIS   ? Breast cancer ([place])   ? Carpal tunnel syndrome 08/23/2015  ? Left  ? Exertional dyspnea   ? Fatigue   ? Gout, unspecified   ? Hyperlipidemia   ? Hypertension   ? meds since age 50   ? Hyperuricemia   ? Nonspecific abnormal electrocardiogram (ECG) (EKG)   ? t wave non acute    ? OBESITY   ? Palpitations   ? hospitalized 2005 felt from stress neg cards eval  ? Primary hyperparathyroidism ([place])   ? 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 ? ?Past Surgical History:  ?Procedure Laterality Date  ? ABDOMINAL HYSTERECTOMY    ? BREAST BIOPSY    ? right side  ? BREAST LUMPECTOMY WITH RADIOACTIVE SEED AND SENTINEL LYMPH NODE BIOPSY Left 06/07/2021  ? Procedure: LEFT BREAST LUMPECTOMY WITH RADIOACTIVE SEED X2 AND AXILLARY SENTINEL LYMPH NODE BIOPSY;  Surgeon: Donnie Mesa, MD;  Location: [facility];  Service: General;  Laterality: Left;  ? COLONOSCOPY    ? FACIAL COSMETIC SURGERY  1980  ? ? ?Vitals:  ? 09/05/21 0931  ?Weight: 217 lb 6 oz (98.6 kg)  ? ? ? Subjective Assessment - 09/05/21 1220   ? ? Subjective Pt returns for her 3 month L-Dex screen.   ? Pertinent History Patient underwent a left lumpectomy and sentinel node biopsy (1 negative node) on 06/07/2021. It is ER/PR positive and HER2 negative with a Ki67  of 40%.   ? ?  ?  ? ?  ? ? ? ? ? ? ? ? ? L-DEX FLOWSHEETS - 09/05/21 0900   ? ?  ? L-DEX LYMPHEDEMA SCREENING  ? Measurement Type Unilateral   ? L-DEX MEASUREMENT EXTREMITY Upper Extremity   ? POSITION  Standing   ? DOMINANT SIDE Right   ? At Risk Side Left   ? BASELINE SCORE (UNILATERAL) 2.9   ? L-DEX SCORE (UNILATERAL) 11.2   ? VALUE CHANGE (UNILAT) 8.3   ? ?  ?  ? ?  ? ? ? ? ? ? ? ? ? ? ? ? ? ? ? ? ? ? ? ? ? ? ? ? ? ? PT Long Term Goals - 07/04/21 1348   ? ?  ? PT LONG TERM GOAL #1  ? Title Patient will demonstrate she has regained full shoulder ROM and function post operatively compared to baselines.   ? Time 8   ? Period Weeks   ? Status Achieved   ? ?  ?  ? ?  ? ? ? ? ? ? ? ? Plan - 09/05/21 0936   ? ? Clinical Impression Statement Pt returns for  her 3 month L-Dex screen. Her change from baseline of 8.3 is higher than normal limits indicating subclinical lymphedema. Pt was measured for a compression sleeve and instructed in daily wear x30 days, donning/doffing technique and how to care for bandage. Also to wear 10hrs/day and not at night. She was issued a size V, extra wide, black compression sleeve and a size III gauntlet, color sand. Pt verbalized understanding all.   ? PT Next Visit Plan Pt to return in 30 days after wearing her compression garments issued today to reassess SOZO; if change in baseline returns to [facility] then reusme every 3 month L-Dex screens for up to 2 years from her [facility] (~06/08/2023)   ? Consulted and Agree with Plan of Care Patient   ? ?  ?  ? ?  ? ? ?Patient will benefit from skilled therapeutic intervention in order to improve the following deficits and impairments:    ? ?Visit Diagnosis: ?Aftercare following surgery for neoplasm ? ? ? ? ?Problem List ?Patient Active Problem List  ? Diagnosis Date Noted  ? Malignant neoplasm of upper-outer quadrant of left breast in female, estrogen receptor positive ([place]) 05/30/2021  ? Pain of left lower extremity 03/16/2021  ? Exertional dyspnea  03/08/2021  ? Decreased exercise tolerance 03/08/2021  ? Hyperparathyroidism ([place]) 02/09/2018  ? Abnormal prominence of clavicle 02/07/2018  ? Psoriasis 10/23/2017  ? Carpal tunnel syndrome 08/23/2015  ? 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 H/O: gout 08/08/2015  ? Elevated blood sugar 08/08/2015  ? Essential hypertension 07/09/2014  ? Hyperlipidemia 07/09/2014  ? Body aches 07/09/2014  ? Urinary frequency 07/09/2014  ? Numbness and tingling in left hand 07/09/2014  ? Visit for preventive health examination 04/11/2013  ? Obesity (BMI 30-39.9) 04/11/2013  ? Statin intolerance 04/11/2013  ? At risk for coronary artery disease 05/15/2012  ? Family history of premature coronary heart disease 04/23/2012  ? Fatigue 12/29/2010  ? Nonspecific abnormal electrocardiogram (ECG) (EKG) 12/29/2010  ? Hyperuricemia 12/29/2010  ? Palpitations   ? OBESITY 01/10/2010  ? HYPERLIPIDEMIA 11/09/2009  ? Gout 11/09/2009  ? HYPERTENSION 11/09/2009  ? ARTHRITIS 11/09/2009  ? ? ?Otelia Limes, PTA ?09/05/2021, 12:22 PM ? ?[facility] ?[place], [place], [address] ?Phone: [phone]   Fax:  [phone] ? ?Name: Christy Young ?MRN: [id number] ?Date of Birth: June 24, 1946 ? ? ? ?

## 2021-09-15 ENCOUNTER — Encounter: Payer: Self-pay | Admitting: *Deleted

## 2021-09-15 ENCOUNTER — Inpatient Hospital Stay: Payer: Medicare Other

## 2021-09-15 ENCOUNTER — Other Ambulatory Visit: Payer: Self-pay

## 2021-09-15 ENCOUNTER — Inpatient Hospital Stay (HOSPITAL_BASED_OUTPATIENT_CLINIC_OR_DEPARTMENT_OTHER): Payer: Medicare Other | Admitting: Hematology

## 2021-09-15 VITALS — BP 116/68 | HR 100 | Temp 98.6°F | Resp 18 | Ht 64.0 in | Wt 221.0 lb

## 2021-09-15 DIAGNOSIS — C50412 Malignant neoplasm of upper-outer quadrant of left female breast: Secondary | ICD-10-CM

## 2021-09-15 DIAGNOSIS — Z5111 Encounter for antineoplastic chemotherapy: Secondary | ICD-10-CM | POA: Diagnosis not present

## 2021-09-15 DIAGNOSIS — Z170 Estrogen receptor positive status [ER+]: Secondary | ICD-10-CM | POA: Diagnosis not present

## 2021-09-15 DIAGNOSIS — Z5189 Encounter for other specified aftercare: Secondary | ICD-10-CM | POA: Diagnosis not present

## 2021-09-15 LAB — CBC WITH DIFFERENTIAL (CANCER CENTER ONLY)
Abs Immature Granulocytes: 0.06 10*3/uL (ref 0.00–0.07)
Basophils Absolute: 0 10*3/uL (ref 0.0–0.1)
Basophils Relative: 0 %
Eosinophils Absolute: 0 10*3/uL (ref 0.0–0.5)
Eosinophils Relative: 0 %
HCT: 32.5 % — ABNORMAL LOW (ref 36.0–46.0)
Hemoglobin: 10.5 g/dL — ABNORMAL LOW (ref 12.0–15.0)
Immature Granulocytes: 1 %
Lymphocytes Relative: 6 %
Lymphs Abs: 0.7 10*3/uL (ref 0.7–4.0)
MCH: 29.7 pg (ref 26.0–34.0)
MCHC: 32.3 g/dL (ref 30.0–36.0)
MCV: 92.1 fL (ref 80.0–100.0)
Monocytes Absolute: 0.6 10*3/uL (ref 0.1–1.0)
Monocytes Relative: 5 %
Neutro Abs: 10.8 10*3/uL — ABNORMAL HIGH (ref 1.7–7.7)
Neutrophils Relative %: 88 %
Platelet Count: 347 10*3/uL (ref 150–400)
RBC: 3.53 MIL/uL — ABNORMAL LOW (ref 3.87–5.11)
RDW: 18 % — ABNORMAL HIGH (ref 11.5–15.5)
WBC Count: 12.3 10*3/uL — ABNORMAL HIGH (ref 4.0–10.5)
nRBC: 0.2 % (ref 0.0–0.2)

## 2021-09-15 LAB — CMP (CANCER CENTER ONLY)
ALT: 26 U/L (ref 0–44)
AST: 17 U/L (ref 15–41)
Albumin: 3.9 g/dL (ref 3.5–5.0)
Alkaline Phosphatase: 71 U/L (ref 38–126)
Anion gap: 11 (ref 5–15)
BUN: 15 mg/dL (ref 8–23)
CO2: 20 mmol/L — ABNORMAL LOW (ref 22–32)
Calcium: 11.2 mg/dL — ABNORMAL HIGH (ref 8.9–10.3)
Chloride: 108 mmol/L (ref 98–111)
Creatinine: 0.64 mg/dL (ref 0.44–1.00)
GFR, Estimated: >60 mL/min (ref >60)
Glucose, Bld: 130 mg/dL — ABNORMAL HIGH (ref 70–99)
Potassium: 4 mmol/L (ref 3.5–5.1)
Sodium: 139 mmol/L (ref 135–145)
Total Bilirubin: 0.5 mg/dL (ref 0.3–1.2)
Total Protein: 6.3 g/dL — ABNORMAL LOW (ref 6.5–8.1)

## 2021-09-15 MED ORDER — SODIUM CHLORIDE 0.9 % IV SOLN
150.0000 mg | Freq: Once | INTRAVENOUS | Status: AC
  Administered 2021-09-15: 150 mg via INTRAVENOUS
  Filled 2021-09-15: qty 150

## 2021-09-15 MED ORDER — SODIUM CHLORIDE 0.9 % IV SOLN
10.0000 mg | Freq: Once | INTRAVENOUS | Status: AC
  Administered 2021-09-15: 10 mg via INTRAVENOUS
  Filled 2021-09-15: qty 10

## 2021-09-15 MED ORDER — SODIUM CHLORIDE 0.9 % IV SOLN
60.0000 mg/m2 | Freq: Once | INTRAVENOUS | Status: AC
  Administered 2021-09-15: 130 mg via INTRAVENOUS
  Filled 2021-09-15: qty 13

## 2021-09-15 MED ORDER — SODIUM CHLORIDE 0.9 % IV SOLN
450.0000 mg/m2 | Freq: Once | INTRAVENOUS | Status: AC
  Administered 2021-09-15: 940 mg via INTRAVENOUS
  Filled 2021-09-15: qty 47

## 2021-09-15 MED ORDER — SODIUM CHLORIDE 0.9 % IV SOLN: Freq: Once | INTRAVENOUS | Status: AC

## 2021-09-15 MED ORDER — PALONOSETRON HCL INJECTION 0.25 MG/5ML
0.2500 mg | Freq: Once | INTRAVENOUS | Status: AC
  Administered 2021-09-15: 0.25 mg via INTRAVENOUS
  Filled 2021-09-15: qty 5

## 2021-09-15 NOTE — Patient Instructions (Signed)
[facility]  Discharge Instructions: ?Thank you for choosing [facility] to provide your oncology and hematology care.  ? ?If you have a lab appointment with the [facility], please go directly to the [facility] and check in at the registration area. ?  ?Wear comfortable clothing and clothing appropriate for easy access to any Portacath or PICC line.  ? ?We strive to give you quality time with your provider. You may need to reschedule your appointment if you arrive late (15 or more minutes).  Arriving late affects you and other patients whose appointments are after yours.  Also, if you miss three or more appointments without notifying the office, you may be dismissed from the clinic at the provider?s discretion.    ?  ?For prescription refill requests, have your pharmacy contact our office and allow 72 hours for refills to be completed.   ? ?Today you received the following chemotherapy and/or immunotherapy agents docetaxel, cytoxan    ?  ?To help prevent nausea and vomiting after your treatment, we encourage you to take your nausea medication as directed. ? ?BELOW ARE SYMPTOMS THAT SHOULD BE REPORTED IMMEDIATELY: ?*FEVER GREATER THAN 100.4 F (38 ?C) OR HIGHER ?*CHILLS OR SWEATING ?*NAUSEA AND VOMITING THAT IS NOT CONTROLLED WITH YOUR NAUSEA MEDICATION ?*UNUSUAL SHORTNESS OF BREATH ?*UNUSUAL BRUISING OR BLEEDING ?*URINARY PROBLEMS (pain or burning when urinating, or frequent urination) ?*BOWEL PROBLEMS (unusual diarrhea, constipation, pain near the anus) ?TENDERNESS IN MOUTH AND THROAT WITH OR WITHOUT PRESENCE OF ULCERS (sore throat, sores in mouth, or a toothache) ?UNUSUAL RASH, SWELLING OR PAIN  ?UNUSUAL VAGINAL DISCHARGE OR ITCHING  ? ?Items with * indicate a potential emergency and should be followed up as soon as possible or go to the Emergency Department if any problems should occur. ? ?Please show the CHEMOTHERAPY ALERT CARD or IMMUNOTHERAPY ALERT CARD at  check-in to the Emergency Department and triage nurse. ? ?Should you have questions after your visit or need to cancel or reschedule your appointment, please contact [facility]  Dept: [phone]  and follow the prompts.  Office hours are 8:00 a.m. to 4:30 p.m. Monday - Friday. Please note that voicemails left after 4:00 p.m. may not be returned until the following business day.  We are closed weekends and major holidays. You have access to a nurse at all times for urgent questions. Please call the main number to the clinic Dept: [phone] and follow the prompts. ? ? ?For any non-urgent questions, you may also contact your provider using MyChart. We now offer e-Visits for anyone 98 and older to request care online for non-urgent symptoms. For details visit [email or web address]. ?  ?Also download the MyChart app! Go to the app store, search "MyChart", open the app, select [facility], and log in with your MyChart username and password. ? ?Due to Covid, a mask is required upon entering the hospital/clinic. If you do not have a mask, one will be given to you upon arrival. For doctor visits, patients may have 1 support person aged 86 or older with them. For treatment visits, patients cannot have anyone with them due to current Covid guidelines and our immunocompromised population.  ? ?

## 2021-09-15 NOTE — Progress Notes (Signed)
?[place]   ?[phone] [phone]   ?Clinic Follow up Note  ? ?Patient Care Team: ?[name], Standley Brooking, MD as PCP - General (Internal Medicine) ?Valinda Party, MD as Referring Physician (Rheumatology) ?Donnie Mesa, MD as Consulting Physician (General Surgery) ?Truitt Merle, MD as Consulting Physician (Hematology) ?Eppie Gibson, MD as Attending Physician (Radiation Oncology) ?Mauro Kaufmann, RN as Oncology Nurse Navigator ?Rockwell Germany, RN as Oncology Nurse Navigator ?Magnus Sinning, MD as Consulting Physician (Physical Medicine and Rehabilitation) ?Nigel Mormon, MD as Consulting Physician (Cardiology) ?Renato Shin, MD as Consulting Physician (Endocrinology) ? ?Date of Service:  09/15/2021 ? ?CHIEF COMPLAINT: f/u of left breast cancer ? ?CURRENT THERAPY:  ?Adjuvant TC q3weeks, starting 07/15/21 ? ?ASSESSMENT & PLAN:  ?Christy Young is a 76 y.o. post-hysterectomy female with  ? ?1. 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 Biopsy on 05/23/21 confirmed invasive ductal carcinoma to both sites. ?-left lumpectomy on 06/07/21 by Dr. Georgette Dover showed grade 3 IDC to both tumors (2.5 cm and 0.8 cm) with small foci of DCIS. Margins and lymph node were negative. ?-Oncotype Dx recurrence score of 29, high risk ?-she began adjuvant TC on 07/15/21. She experienced nausea, fatigue, diarrhea, and palpitations. S/p C2 she experienced left eye vision changes, a large rash/bruise to her forearm from infusion, peeling to her hands. She has recovered well. Despite reduced dose with C3, she continued to experience significant side effects. However, she has recovered well and wants to proceed with C4.  ?-labs reviewed, overall stable and adequate for final cycle TC. ?-she is scheduled to meet Dr. Isidore Moos on 10/03/21. ?-We discussed adjuvant  antiestrogen therapy, plan to start after she completes radiation.  I will see her back in 6 weeks for follow up.  ?  ?2. Bone Health  ?-Her most recent DEXA was 06/08/20 showing -0.8 (normal) ?-I previously explained that AI can decrease bone density. ?  ?  ?PLAN:  ?-proceed with last cycle TC today with same dose reduction as last cycle  ?            -Udenyca day 3 ?-consult with Dr.Squire 4/10 ? ? ?No problem-specific Assessment & Plan notes found for this encounter. ? ? ?SUMMARY OF ONCOLOGIC HISTORY: ?Oncology History Overview Note  ? Cancer Staging  ?Malignant neoplasm of upper-outer quadrant of left breast in female, estrogen receptor positive ([place]) ?Staging form: Breast, AJCC 8th Edition ?- Clinical stage from 06/01/2021: Stage IIA (cT2, cN0, cM0, G3, ER+, PR+, HER2-) - Unsigned ? ? ? ?  ?Malignant neoplasm of upper-outer quadrant of left breast in female, estrogen receptor positive ([place])  ?05/02/2021 Mammogram  ? Exam: 3D Mammogram Diagnostic Bilateral ?IMPRESSION: ?Palpable 2.4 cm irregular high density mass in the upper-outer left breast. ? ?Exam: Breast Ultrasound - Left ?IMPRESSION: ?1. 2.3 cm irregular mass in left breast at 1 o'clock posterior depth, 13 mc from nipple. ?2. 0.9 cm irregular mass in left breast at 1 o'clock posterior depth, 10 cm from nipple ?3. Three small 3-4 mm round masses favored to represent small cysts seen at 2:30-3:00 11-12 cm from nipple. ?4. No abnormal nodes in left axilla. ?  ?05/23/2021 Initial Biopsy  ? Diagnosis ?1. Breast, left, needle core biopsy, breast mass, 2.3 cm @ 1:00 13 CMFN ? - INVASIVE DUCTAL CARCINOMA, grade 3  ?2. Breast, left, needle core biopsy, breast mass, 0.9 cm @  1:00 10 CMFN ? - INVASIVE DUCTAL CARCINOMA, grade 1 ? ?1. PROGNOSTIC INDICATORS ?Results: ?The tumor cells are EQUIVOCAL for Her2 (2+) ?Estrogen Receptor: 100%, POSITIVE, STRONG STAINING INTENSITY ?Progesterone Receptor: 60%, POSITIVE, STRONG STAINING INTENSITY ?Proliferation Marker Ki67:  40% ? ?1. FLUORESCENCE IN-SITU HYBRIDIZATION ?Results: ?GROUP 5: HER2 **NEGATIVE** ? ?2. PROGNOSTIC INDICATORS ?Results: ?The tumor cells are NEGATIVE for Her2 (1+) ?Estrogen Receptor: 100%, POSITIVE, STRONG STAINING INTENSITY ?Progesterone Receptor: 100%, POSITIVE, STRONG STAINING INTENSITY ?Proliferation Marker Ki67: 10% ?  ?05/30/2021 Initial Diagnosis  ? Malignant neoplasm of upper-outer quadrant of left breast in female, estrogen receptor positive ([place]) ?  ?06/07/2021 Definitive Surgery  ? FINAL MICROSCOPIC DIAGNOSIS:  ? ?A. BREAST, LEFT, LUMPECTOMY:  ?Invasive ductal carcinomas (two independent lesions, 2.5 cm and 0.8 cm) with clear margins of resection.  ?Please see the synoptic report after specimen B.  ? ?B. LYMPH NODE, LEFT AXILLARY #1, SENTINEL, EXCISION:  ?Hyperplastic lymph node with fatty infiltration which is negative for  ?metastatic carcinoma.  ?  ?06/07/2021 Oncotype testing  ? Oncotype DX was obtained on the final surgical sample and the recurrence score of 28 predicts a risk of recurrence outside the breast over the next 9 years of 18%, if the patient's only systemic therapy is an antiestrogen for 5 years.  It also predicts a significant benefit from chemotherapy.  ?  ?06/07/2021 Cancer Staging  ? Staging form: Breast, AJCC 8th Edition ?- Pathologic stage from 06/07/2021: Stage IB (pT2, pN0, cM0, G3, ER+, PR+, HER2-, Oncotype DX score: 29) - Signed by Truitt Merle, MD on 08/05/2021 ?Stage prefix: Initial diagnosis ?Multigene prognostic tests performed: Oncotype DX ?Recurrence score range: Greater than or equal to 11 ?Histologic grading system: 3 grade system ? ?  ?07/15/2021 -  Chemotherapy  ? Patient is on Treatment Plan : BREAST TC q21d  ?   ? ? ? ?INTERVAL HISTORY:  ?Christy Young is here for a follow up of breast cancer. She was last seen by me on 08/26/21. She presents to the clinic alone. ?She reports she has a headache and had a more difficult time with cycle 3, despite dose reduction. She does  note she has been able to recover well. ?  ?All other systems were reviewed with the patient and are negative. ? ?MEDICAL HISTORY:  ?Past Medical History:  ?Diagnosis Date  ? ARTHRITIS   ? Breast cancer ([place])   ? Carpal tunnel syndrome 08/23/2015  ? Left  ? Exertional dyspnea   ? Fatigue   ? Gout, unspecified   ? Hyperlipidemia   ? Hypertension   ? meds since age 29   ? Hyperuricemia   ? Nonspecific abnormal electrocardiogram (ECG) (EKG)   ? t wave non acute    ? OBESITY   ? Palpitations   ? hospitalized 2005 felt from stress neg cards eval  ? Primary hyperparathyroidism ([place])   ? 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 ? ?SURGICAL HISTORY: ?Past Surgical History:  ?Procedure Laterality Date  ? ABDOMINAL HYSTERECTOMY    ? BREAST BIOPSY    ? right side  ? BREAST LUMPECTOMY WITH RADIOACTIVE SEED AND SENTINEL LYMPH NODE BIOPSY Left 06/07/2021  ? Procedure: LEFT BREAST LUMPECTOMY WITH RADIOACTIVE SEED X2 AND AXILLARY SENTINEL LYMPH NODE BIOPSY;  Surgeon: Donnie Mesa, MD;  Location: [facility];  Service: General;  Laterality: Left;  ? COLONOSCOPY    ? FACIAL COSMETIC SURGERY  1980  ? ? ?I have reviewed the social history and family history  with the patient and they are unchanged from previous note. ? ?ALLERGIES:  is allergic to pravastatin, rosuvastatin, and penicillins. ? ?MEDICATIONS:  ?Current Outpatient Medications  ?Medication Sig Dispense Refill  ? allopurinol (ZYLOPRIM) 100 MG tablet TAKE 2 TABLETS BY MOUTH EVERY DAY 180 tablet 0  ? atorvastatin (LIPITOR) 40 MG tablet Take 1 tablet (40 mg total) by mouth daily. 90 tablet 3  ? dexamethasone (DECADRON) 4 MG tablet Take 1 tablet (4 mg total) by mouth 2 (two) times daily. Start the day before Taxotere. Then again the day after chemo for 3 days. 30 tablet 1  ? furosemide (LASIX) 20 MG tablet Take 1 tablet (20 mg total) by mouth daily. 60 tablet 3  ? HYDROcodone-acetaminophen (NORCO/VICODIN) 5-325 MG tablet Take 1 tablet by mouth every 6 (six)  hours as needed for moderate pain. 15 tablet 0  ? Multiple Vitamins-Minerals (MULTIVITAMIN WITH MINERALS) tablet Take 1 tablet by mouth daily.    ? nebivolol (BYSTOLIC) 5 MG tablet Take 1 tablet (5 mg total) by mouth daily

## 2021-09-16 ENCOUNTER — Ambulatory Visit: Payer: Medicare Other | Admitting: Endocrinology

## 2021-09-16 ENCOUNTER — Telehealth: Payer: Self-pay | Admitting: Hematology

## 2021-09-16 NOTE — Telephone Encounter (Signed)
Scheduled per 3/23 los, pt has been called and confirmed  ?

## 2021-09-17 ENCOUNTER — Inpatient Hospital Stay: Payer: Medicare Other

## 2021-09-17 ENCOUNTER — Encounter: Payer: Self-pay | Admitting: Hematology

## 2021-09-17 ENCOUNTER — Other Ambulatory Visit: Payer: Self-pay

## 2021-09-17 VITALS — BP 138/85 | HR 95 | Temp 97.9°F | Resp 16

## 2021-09-17 DIAGNOSIS — Z170 Estrogen receptor positive status [ER+]: Secondary | ICD-10-CM | POA: Diagnosis not present

## 2021-09-17 DIAGNOSIS — Z5189 Encounter for other specified aftercare: Secondary | ICD-10-CM | POA: Diagnosis not present

## 2021-09-17 DIAGNOSIS — C50412 Malignant neoplasm of upper-outer quadrant of left female breast: Secondary | ICD-10-CM | POA: Diagnosis not present

## 2021-09-17 DIAGNOSIS — Z5111 Encounter for antineoplastic chemotherapy: Secondary | ICD-10-CM | POA: Diagnosis not present

## 2021-09-17 MED ORDER — PEGFILGRASTIM-CBQV 6 MG/0.6ML ~~LOC~~ SOSY
6.0000 mg | PREFILLED_SYRINGE | Freq: Once | SUBCUTANEOUS | Status: AC
  Administered 2021-09-17: 6 mg via SUBCUTANEOUS
  Filled 2021-09-17: qty 0.6

## 2021-09-22 ENCOUNTER — Encounter: Payer: Self-pay | Admitting: Endocrinology

## 2021-09-22 ENCOUNTER — Ambulatory Visit (INDEPENDENT_AMBULATORY_CARE_PROVIDER_SITE_OTHER): Payer: Medicare Other | Admitting: Endocrinology

## 2021-09-22 VITALS — BP 136/82 | HR 105 | Ht 64.0 in | Wt 215.2 lb

## 2021-09-22 DIAGNOSIS — E213 Hyperparathyroidism, unspecified: Secondary | ICD-10-CM | POA: Diagnosis not present

## 2021-09-22 MED ORDER — VITAMIN D3 125 MCG (5000 UT) PO CAPS: 5000.0000 [IU] | ORAL_CAPSULE | Freq: Every day | ORAL | 3 refills | Status: AC
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
## 2021-09-22 NOTE — Patient Instructions (Addendum)
Blood tests are requested for you today.  We'll let you know about the results.  Please come back for a follow-up appointment in 6 months.   

## 2021-09-23 LAB — PTH, INTACT AND CALCIUM
Calcium: 10.3 mg/dL (ref 8.6–10.4)
PTH: 196 pg/mL — ABNORMAL HIGH (ref 16–77)

## 2021-09-23 LAB — VITAMIN D 25 HYDROXY (VIT D DEFICIENCY, FRACTURES): VITD: 46.12 ng/mL (ref 30.00–100.00)

## 2021-09-26 ENCOUNTER — Ambulatory Visit: Payer: Medicare Other | Attending: Surgery

## 2021-09-26 VITALS — Wt 216.2 lb

## 2021-09-26 DIAGNOSIS — Z483 Aftercare following surgery for neoplasm: Secondary | ICD-10-CM | POA: Insufficient documentation

## 2021-09-26 NOTE — Therapy (Signed)
?OUTPATIENT PHYSICAL THERAPY SOZO SCREENING NOTE ? ? ?Patient Name: Christy Young ?MRN: [id number] ?DOB:08/03/1945, 75 y.o., female ?Today's Date: 09/26/2021 ? ?PCP: [name], Wanda K, MD ?REFERRING PROVIDER: Tsuei, Matthew, MD ? ? PT End of Session - 09/26/21 0945   ? ? Visit Number 2   # unchanged due to screen only  ? PT Start Time 0943   ? PT Stop Time 0948   ? PT Time Calculation (min) 5 min   ? Activity Tolerance Patient tolerated treatment well   ? Behavior During Therapy WFL for tasks assessed/performed   ? ?  ?  ? ?  ? ? ?Past Medical History:  ?Diagnosis Date  ? ARTHRITIS   ? Breast cancer ([facility])   ? Carpal tunnel syndrome 08/23/2015  ? Left  ? Exertional dyspnea   ? Fatigue   ? Gout, unspecified   ? Hyperlipidemia   ? Hypertension   ? meds since age 50   ? Hyperuricemia   ? Nonspecific abnormal electrocardiogram (ECG) (EKG)   ? t wave non acute    ? OBESITY   ? Palpitations   ? hospitalized 2005 felt from stress neg cards eval  ? Primary hyperparathyroidism ([facility])   ? 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 ?Past Surgical History:  ?Procedure Laterality Date  ? ABDOMINAL HYSTERECTOMY    ? BREAST BIOPSY    ? right side  ? BREAST LUMPECTOMY WITH RADIOACTIVE SEED AND SENTINEL LYMPH NODE BIOPSY Left 06/07/2021  ? Procedure: LEFT BREAST LUMPECTOMY WITH RADIOACTIVE SEED X2 AND AXILLARY SENTINEL LYMPH NODE BIOPSY;  Surgeon: Tsuei, Matthew, MD;  Location: [facility];  Service: General;  Laterality: Left;  ? COLONOSCOPY    ? FACIAL COSMETIC SURGERY  1980  ? ?Patient Active Problem List  ? Diagnosis Date Noted  ? Malignant neoplasm of upper-outer quadrant of left breast in female, estrogen receptor positive ([facility]) 05/30/2021  ? Pain of left lower extremity 03/16/2021  ? Exertional dyspnea 03/08/2021  ? Decreased exercise tolerance 03/08/2021  ? Hyperparathyroidism ([facility]) 02/09/2018  ? Abnormal prominence of clavicle 02/07/2018  ? Psoriasis 10/23/2017  ? Carpal tunnel syndrome 08/23/2015  ? Ulnar  neuropathy at elbow of left upper extremity 08/23/2015  ? H/O: gout 08/08/2015  ? Elevated blood sugar 08/08/2015  ? Essential hypertension 07/09/2014  ? Hyperlipidemia 07/09/2014  ? Body aches 07/09/2014  ? Urinary frequency 07/09/2014  ? Numbness and tingling in left hand 07/09/2014  ? Visit for preventive health examination 04/11/2013  ? Obesity (BMI 30-39.9) 04/11/2013  ? Statin intolerance 04/11/2013  ? At risk for coronary artery disease 05/15/2012  ? Family history of premature coronary heart disease 04/23/2012  ? Fatigue 12/29/2010  ? Nonspecific abnormal electrocardiogram (ECG) (EKG) 12/29/2010  ? Hyperuricemia 12/29/2010  ? Palpitations   ? OBESITY 01/10/2010  ? HYPERLIPIDEMIA 11/09/2009  ? Gout 11/09/2009  ? HYPERTENSION 11/09/2009  ? ARTHRITIS 11/09/2009  ? ? ?REFERRING DIAG: left breast cancer at risk for lymphedema ? ?THERAPY DIAG: Aftercare following surgery for neoplasm ? ?PERTINENT HISTORY: Patient underwent a left lumpectomy and sentinel node biopsy (1 negative node) on 06/07/2021. It is ER/PR positive and HER2 negative with a Ki67 of 40%.  ? ?PRECAUTIONS: left UE Lymphedema risk, None ? ?SUBJECTIVE: Pt returns for her 1 month SOZO after having a high change from baseline and wearing her compression sleeve daily since.  ? ?PAIN:  ?Are you having pain? No (pt reports she has been seeing her doctor for intermittent leg pain)  ? ?  SOZO SCREENING: ?Patient was reassessed today using the SOZO machine to determine the lymphedema index score after having a high change from baseline x1 month ago. This was compared to her baseline score. It was determined that she is now back within the recommended range when compared to her baseline and no further action is needed at this time. She will continue SOZO screenings and was encouraged to cont wearing her compression sleeve with highly repetitive activity and/or when going for daily walks. Screenings are done every 3 months for 2 years post operatively followed  by every 6 months for 2 years, and then annually. ? ? ? ? ?Rosenberger, Valerie Ann, PTA ?09/26/2021, 9:58 AM ? ?  ? ?

## 2021-09-28 ENCOUNTER — Telehealth: Payer: Self-pay | Admitting: Physical Medicine and Rehabilitation

## 2021-09-28 NOTE — Telephone Encounter (Signed)
Pt called requesting a call back to set appt for left leg and back injection. Please call pt at [phone]. ?

## 2021-09-30 NOTE — Progress Notes (Signed)
Location of Breast Cancer:  ?Malignant neoplasm of upper-outer quadrant of left breast in female, estrogen receptor positive ? ?Histology per Pathology Report:  ?06/07/2021 ?FINAL MICROSCOPIC DIAGNOSIS:  ?A. BREAST, LEFT, LUMPECTOMY:  ?- Invasive ductal carcinomas (two independent lesions) with clear margins of resection.  ?- Please see the synoptic report after specimen B.  ?B. LYMPH NODE, LEFT AXILLARY #1, SENTINEL, EXCISION:  ?- Hyperplastic lymph node with fatty infiltration which is negative for metastatic carcinoma.  ? ?Receptor Status: ER(100%), PR (100%), Her2-neu (Negative via FISH), Ki-67(40%) ? ?Did patient present with symptoms (if so, please note symptoms) or was this found on screening mammography?: Patient presented with palpable left breast lump. B/L diagnostic MM and left [place] on 05/02/21 showed two masses in left breast at 1 o'clock, 2.3 cm at 13 cmfn and 0.9 cm at 10 cmfn ? ?Past/Anticipated interventions by surgeon, if any:  ?06/07/2021 ?--Dr. Donnie Mesa ?Left bracketed radioactive seed localized lumpectomy  ?Sentinel lymph node biopsy with Magtrace and radiotracer ? ?Past/Anticipated interventions by medical oncology, if any:  ?Under care of Dr. Truitt Merle ?09/15/2021 ?Oncotype Dx recurrence score of 29, high risk ?She began adjuvant TC on 07/15/21.  ?She experienced nausea, fatigue, diarrhea, and palpitations. S/p C2 she experienced left eye vision changes, a large rash/bruise to her forearm from infusion, peeling to her hands. She has recovered well.  ?Despite reduced dose with C3, she continued to experience significant side effects. However, she has recovered well and wants to proceed with C4.  ?Labs reviewed, overall stable and adequate for final cycle TC. ?she is scheduled to meet Dr. Isidore Moos on 10/03/21. ?We discussed adjuvant antiestrogen therapy, plan to start after she completes radiation.   ?I will see her back in 6 weeks for follow up.  ?--PLAN:  ?Proceed with last cycle TC today with  same dose reduction as last cycle  ?Udenyca day 3 ?consult with Dr.Squire 4/10 ? ?Lymphedema issues, if any:  Patient denies   ? ?Pain issues, if any:  Denies any pain to the breast, but does report chronic back pain  ? ?SAFETY ISSUES: ?Prior radiation? No ?Pacemaker/ICD? No ?Possible current pregnancy? No--hysterectomy ?Is the patient on methotrexate? No ? ?Current Complaints / other details:  Fatigues and becomes short of breath quickly with minimal activity since completing systemic therapy ?   ? ? ? ?

## 2021-10-02 NOTE — Progress Notes (Signed)
?Radiation Oncology         [phone] ?________________________________ ? ?Name: Christy Young MRN: [id number]  ?Date: 10/03/2021  DOB: Nov 20, 1945 ? ?Follow-Up Visit Note ? ?Outpatient ? ?CC: [name], Standley Brooking, MD  Truitt Merle, MD ? ?Diagnosis:    ?  ICD-10-CM   ?1. Malignant neoplasm of upper-outer quadrant of left breast in female, estrogen receptor positive ([place])  C50.412   ? Z17.0   ?  ?  ?S/p left lumpectomy: Stage IB (pT2, pN0, cM0) Left Breast UOQ, Two independent lesions of invasive ductal carcinoma with a small foci of DCIS, ER+ / PR+ / Her2-, Grade 3 ? ?CHIEF COMPLAINT: Here to discuss management of left breast cancer ? ?Narrative:  The patient returns today for follow-up.   ?  ?Since breast clinic consultation date of 06/01/21, the patient opted to proceed with left breast lumpectomy and nodal biopsies on 06/07/21 under the care of Dr. Georgette Dover. Pathology from the procedure revealed: two lesions the size of 2.5 x 2.2  x 1.9 cm and 0.8 x 0.8  x 0.7 cm; histology of invasive ductal carcinomas (two independent lesions) with clear margins of resection, and a small foci of DCIS adjacent to the larger tumor; margin status to invasive disease of 2 mm from the anterior margin; margin status to in situ disease of 3 mm from the anterior margin; nodal status of 1/1 left axillary SLN negative for metastatic carcinoma;  ER status: 100% positive; PR status 100% positive (both with strong staining intensity); Proliferation marker Ki67 at 40%; Her2 status negative; Grade 3. ? ?Oncotype DX was obtained on the final surgical sample and the recurrence score of 29 predicts a risk of recurrence outside the breast over the next 9 years of 18%, if the patient's only systemic therapy is an antiestrogen for 5 years.  It also predicts a significant benefit from chemotherapy. ? ?Systemic therapy, if applicable, involved (dates and therapy as follows): the patient has completed a course chemotherapy consisting docetaxel and  cyclophosphamide every 3 weeks for 4 cycles from 07/15/21 through 09/15/21 under Dr. Burr Medico. Chemo toxicities encountered by the patient included nausea, fatigue, diarrhea, and palpitations. S/p C2 she experienced left eye vision changes, a large rash/bruise to her forearm from infusion, and peeling to her hands. Despite reduced dosing with C3, she continued to experience significant side effects. However, she recovered well enough to proceed with C4.  ? ?The patient will return to Dr. Burr Medico following RT to discuss antiestrogen treatment options.  ? ?Symptomatically, the patient reports:  ?Lymphedema issues, if any:  Patient denies   ? ?Pain issues, if any:  Denies any pain to the breast, but does report chronic back pain  ? ?SAFETY ISSUES: ?Prior radiation? No ?Pacemaker/ICD? No ?Possible current pregnancy? No--hysterectomy ?Is the patient on methotrexate? No ? ?Current Complaints / other details:  Fatigues and becomes short of breath quickly with minimal activity since completing systemic therapy ?   ?       ?ALLERGIES:  is allergic to pravastatin, rosuvastatin, and penicillins. ? ?Meds: ?Current Outpatient Medications  ?Medication Sig Dispense Refill  ? allopurinol (ZYLOPRIM) 100 MG tablet TAKE 2 TABLETS BY MOUTH EVERY DAY 180 tablet 0  ? atorvastatin (LIPITOR) 40 MG tablet Take 1 tablet (40 mg total) by mouth daily. 90 tablet 3  ? Cholecalciferol (VITAMIN D3) 125 MCG (5000 UT) CAPS Take 1 capsule (5,000 Units total) by mouth daily. 100 capsule 3  ? dexamethasone (DECADRON) 4 MG tablet Take 1 tablet (4  mg total) by mouth 2 (two) times daily. Start the day before Taxotere. Then again the day after chemo for 3 days. 30 tablet 1  ? furosemide (LASIX) 20 MG tablet Take 1 tablet (20 mg total) by mouth daily. 60 tablet 3  ? HYDROcodone-acetaminophen (NORCO/VICODIN) 5-325 MG tablet Take 1 tablet by mouth every 6 (six) hours as needed for moderate pain. 15 tablet 0  ? Multiple Vitamins-Minerals (MULTIVITAMIN WITH MINERALS)  tablet Take 1 tablet by mouth daily.    ? nebivolol (BYSTOLIC) 5 MG tablet Take 1 tablet (5 mg total) by mouth daily. 90 tablet 1  ? ondansetron (ZOFRAN) 8 MG tablet Take 1 tablet (8 mg total) by mouth 2 (two) times daily as needed for refractory nausea / vomiting. Start on day 3 after chemo. 30 tablet 1  ? prochlorperazine (COMPAZINE) 10 MG tablet Take 1 tablet (10 mg total) by mouth every 6 (six) hours as needed (Nausea or vomiting). 30 tablet 1  ? ?No current facility-administered medications for this encounter.  ? ? ?Physical Findings: ? weight is 216 lb 6 oz (98.1 kg). Her blood pressure is 106/74 and her pulse is 95. Her respiration is 20 and oxygen saturation is 97%. .    ? ?General: Alert and oriented, in no acute distress ?HEENT: Head is normocephalic. Extraocular movements are intact.   ?Musculoskeletal: Good range of motion in left shoulder ?Neurologic: No obvious focalities. Speech is fluent.  ?Psychiatric: Judgment and insight are intact. Affect is appropriate. ?Breast exam reveals excellent healing of left lumpectomy and left axillary scars ? ?Lab Findings: ?Lab Results  ?Component Value Date  ? WBC 12.3 (H) 09/15/2021  ? HGB 10.5 (L) 09/15/2021  ? HCT 32.5 (L) 09/15/2021  ? MCV 92.1 09/15/2021  ? PLT 347 09/15/2021  ? ? ?_0 @ ? ?Radiographic Findings: ?No results found. ? ?Impression/Plan: Left breast cancer ?We discussed adjuvant radiotherapy today.  I recommend radiation therapy to the left breast at over 4 weeks in order to reduce risk of local regional recurrence by two thirds.  I reviewed the logistics, benefits, risks, and potential side effects of this treatment in detail. Risks may include but not necessary be limited to acute and late injury tissue in the radiation fields such as skin irritation (change in color/pigmentation, itching, dryness, pain, peeling). She may experience fatigue. We also discussed possible risk of long term cosmetic changes or scar tissue. There is also a  smaller risk for lung toxicity, cardiac toxicity,  lymphedema, musculoskeletal changes, rib fragility or induction of a second malignancy, late chronic non-healing soft tissue wound.   ? ?The patient asked good questions which I answered to her satisfaction. She is enthusiastic about proceeding with treatment. A consent form has been signed and placed in her chart.  We will proceed with CT simulation today and start her treatment in about a week  ? ?On date of service, in total, I spent 30 minutes on this encounter. Patient was seen in person.  ?_____________________________________ ? ? ?Eppie Gibson, MD ? ?This document serves as a record of services personally performed by Eppie Gibson, MD. It was created on her behalf by Roney Mans, a trained medical scribe. The creation of this record is based on the scribe's personal observations and the provider's statements to them. This document has been checked and approved by the attending provider. ? ?

## 2021-10-03 ENCOUNTER — Ambulatory Visit
Admission: RE | Admit: 2021-10-03 | Discharge: 2021-10-03 | Disposition: A | Discharge status: Home / Self Care | Payer: Medicare Other | Source: Ambulatory Visit | Attending: Radiation Oncology | Admitting: Radiation Oncology

## 2021-10-03 ENCOUNTER — Other Ambulatory Visit: Payer: Self-pay

## 2021-10-03 ENCOUNTER — Encounter: Payer: Self-pay | Admitting: Radiation Oncology

## 2021-10-03 ENCOUNTER — Telehealth: Payer: Self-pay | Admitting: Physical Medicine and Rehabilitation

## 2021-10-03 VITALS — BP 106/74 | HR 95 | Resp 20 | Wt 216.4 lb

## 2021-10-03 DIAGNOSIS — C50412 Malignant neoplasm of upper-outer quadrant of left female breast: Secondary | ICD-10-CM | POA: Insufficient documentation

## 2021-10-03 DIAGNOSIS — R110 Nausea: Secondary | ICD-10-CM | POA: Diagnosis not present

## 2021-10-03 DIAGNOSIS — Z170 Estrogen receptor positive status [ER+]: Secondary | ICD-10-CM

## 2021-10-03 DIAGNOSIS — R5383 Other fatigue: Secondary | ICD-10-CM | POA: Diagnosis not present

## 2021-10-03 DIAGNOSIS — Z510 Encounter for antineoplastic radiation therapy: Secondary | ICD-10-CM | POA: Diagnosis not present

## 2021-10-03 DIAGNOSIS — Z79899 Other long term (current) drug therapy: Secondary | ICD-10-CM | POA: Insufficient documentation

## 2021-10-03 DIAGNOSIS — M549 Dorsalgia, unspecified: Secondary | ICD-10-CM | POA: Insufficient documentation

## 2021-10-03 DIAGNOSIS — R197 Diarrhea, unspecified: Secondary | ICD-10-CM | POA: Diagnosis not present

## 2021-10-03 DIAGNOSIS — G8929 Other chronic pain: Secondary | ICD-10-CM | POA: Diagnosis not present

## 2021-10-03 DIAGNOSIS — R002 Palpitations: Secondary | ICD-10-CM | POA: Diagnosis not present

## 2021-10-03 NOTE — Telephone Encounter (Signed)
Pt called requesting a call back to set an appt. Please call pt at [phone] ?

## 2021-10-04 ENCOUNTER — Encounter: Payer: Self-pay | Admitting: Radiation Oncology

## 2021-10-07 DIAGNOSIS — Z510 Encounter for antineoplastic radiation therapy: Secondary | ICD-10-CM | POA: Diagnosis not present

## 2021-10-07 DIAGNOSIS — Z170 Estrogen receptor positive status [ER+]: Secondary | ICD-10-CM | POA: Diagnosis not present

## 2021-10-07 DIAGNOSIS — C50412 Malignant neoplasm of upper-outer quadrant of left female breast: Secondary | ICD-10-CM | POA: Diagnosis not present

## 2021-10-10 ENCOUNTER — Ambulatory Visit
Admission: RE | Admit: 2021-10-10 | Discharge: 2021-10-10 | Disposition: A | Discharge status: Home / Self Care | Payer: Medicare Other | Source: Ambulatory Visit | Attending: Radiation Oncology | Admitting: Radiation Oncology

## 2021-10-10 ENCOUNTER — Encounter: Payer: Self-pay | Admitting: *Deleted

## 2021-10-10 ENCOUNTER — Other Ambulatory Visit: Payer: Self-pay

## 2021-10-10 DIAGNOSIS — Z510 Encounter for antineoplastic radiation therapy: Secondary | ICD-10-CM | POA: Diagnosis not present

## 2021-10-10 DIAGNOSIS — C50412 Malignant neoplasm of upper-outer quadrant of left female breast: Secondary | ICD-10-CM

## 2021-10-10 DIAGNOSIS — Z170 Estrogen receptor positive status [ER+]: Secondary | ICD-10-CM | POA: Diagnosis not present

## 2021-10-10 MED ORDER — RADIAPLEXRX EX GEL: Freq: Once | CUTANEOUS | Status: AC

## 2021-10-10 NOTE — Progress Notes (Signed)
Pt here for patient teaching. Pt given Radiation and You booklet, skin care instructions, and Radiaplex gel. Reviewed areas of pertinence such as fatigue, hair loss, nausea and vomiting, skin changes, breast tenderness, breast swelling, earaches, and taste changes. Pt able to give teach back of to pat skin, use unscented/gentle soap, have Imodium on hand, and drink plenty of water, apply Radiaplex bid, avoid applying anything to skin within 4 hours of treatment, avoid wearing an under wire bra, and to use an electric razor if they must shave. Pt verbalizes understanding of information given and will contact nursing with any questions or concerns.   ? ? [email or web address]/whattoexpect/index ? ? ? ? ? ?

## 2021-10-11 ENCOUNTER — Other Ambulatory Visit: Payer: Self-pay

## 2021-10-11 ENCOUNTER — Ambulatory Visit
Admission: RE | Admit: 2021-10-11 | Discharge: 2021-10-11 | Disposition: A | Discharge status: Home / Self Care | Payer: Medicare Other | Source: Ambulatory Visit | Attending: Radiation Oncology | Admitting: Radiation Oncology

## 2021-10-11 DIAGNOSIS — Z510 Encounter for antineoplastic radiation therapy: Secondary | ICD-10-CM | POA: Diagnosis not present

## 2021-10-11 DIAGNOSIS — C50412 Malignant neoplasm of upper-outer quadrant of left female breast: Secondary | ICD-10-CM | POA: Diagnosis not present

## 2021-10-11 DIAGNOSIS — Z170 Estrogen receptor positive status [ER+]: Secondary | ICD-10-CM | POA: Diagnosis not present

## 2021-10-11 LAB — RAD ONC ARIA SESSION SUMMARY
Course Elapsed Days: 1
Plan Fractions Treated to Date: 2
Plan Prescribed Dose Per Fraction: 2.67 Gy
Plan Total Fractions Prescribed: 15
Plan Total Prescribed Dose: 40.05 Gy
Reference Point Dosage Given to Date: 5.34 Gy
Reference Point Session Dosage Given: 2.67 Gy
Session Number: 2

## 2021-10-12 ENCOUNTER — Other Ambulatory Visit: Payer: Self-pay

## 2021-10-12 ENCOUNTER — Ambulatory Visit
Admission: RE | Admit: 2021-10-12 | Discharge: 2021-10-12 | Disposition: A | Discharge status: Home / Self Care | Payer: Medicare Other | Source: Ambulatory Visit | Attending: Radiation Oncology | Admitting: Radiation Oncology

## 2021-10-12 DIAGNOSIS — Z510 Encounter for antineoplastic radiation therapy: Secondary | ICD-10-CM | POA: Diagnosis not present

## 2021-10-12 DIAGNOSIS — C50412 Malignant neoplasm of upper-outer quadrant of left female breast: Secondary | ICD-10-CM | POA: Diagnosis not present

## 2021-10-12 DIAGNOSIS — Z170 Estrogen receptor positive status [ER+]: Secondary | ICD-10-CM | POA: Diagnosis not present

## 2021-10-12 LAB — RAD ONC ARIA SESSION SUMMARY
Course Elapsed Days: 2
Plan Fractions Treated to Date: 3
Plan Prescribed Dose Per Fraction: 2.67 Gy
Plan Total Fractions Prescribed: 15
Plan Total Prescribed Dose: 40.05 Gy
Reference Point Dosage Given to Date: 8.01 Gy
Reference Point Session Dosage Given: 2.67 Gy
Session Number: 3

## 2021-10-13 ENCOUNTER — Other Ambulatory Visit: Payer: Self-pay

## 2021-10-13 ENCOUNTER — Ambulatory Visit
Admission: RE | Admit: 2021-10-13 | Discharge: 2021-10-13 | Disposition: A | Discharge status: Home / Self Care | Payer: Medicare Other | Source: Ambulatory Visit | Attending: Radiation Oncology | Admitting: Radiation Oncology

## 2021-10-13 DIAGNOSIS — Z510 Encounter for antineoplastic radiation therapy: Secondary | ICD-10-CM | POA: Diagnosis not present

## 2021-10-13 DIAGNOSIS — Z170 Estrogen receptor positive status [ER+]: Secondary | ICD-10-CM | POA: Diagnosis not present

## 2021-10-13 DIAGNOSIS — C50412 Malignant neoplasm of upper-outer quadrant of left female breast: Secondary | ICD-10-CM | POA: Diagnosis not present

## 2021-10-13 LAB — RAD ONC ARIA SESSION SUMMARY
Course Elapsed Days: 3
Plan Fractions Treated to Date: 4
Plan Prescribed Dose Per Fraction: 2.67 Gy
Plan Total Fractions Prescribed: 15
Plan Total Prescribed Dose: 40.05 Gy
Reference Point Dosage Given to Date: 10.68 Gy
Reference Point Session Dosage Given: 2.67 Gy
Session Number: 4

## 2021-10-14 ENCOUNTER — Other Ambulatory Visit: Payer: Self-pay

## 2021-10-14 ENCOUNTER — Ambulatory Visit
Admission: RE | Admit: 2021-10-14 | Discharge: 2021-10-14 | Disposition: A | Discharge status: Home / Self Care | Payer: Medicare Other | Source: Ambulatory Visit | Attending: Radiation Oncology | Admitting: Radiation Oncology

## 2021-10-14 DIAGNOSIS — Z510 Encounter for antineoplastic radiation therapy: Secondary | ICD-10-CM | POA: Diagnosis not present

## 2021-10-14 DIAGNOSIS — C50412 Malignant neoplasm of upper-outer quadrant of left female breast: Secondary | ICD-10-CM | POA: Diagnosis not present

## 2021-10-14 DIAGNOSIS — Z170 Estrogen receptor positive status [ER+]: Secondary | ICD-10-CM | POA: Diagnosis not present

## 2021-10-14 LAB — RAD ONC ARIA SESSION SUMMARY
Course Elapsed Days: 4
Plan Fractions Treated to Date: 5
Plan Prescribed Dose Per Fraction: 2.67 Gy
Plan Total Fractions Prescribed: 15
Plan Total Prescribed Dose: 40.05 Gy
Reference Point Dosage Given to Date: 13.35 Gy
Reference Point Session Dosage Given: 2.67 Gy
Session Number: 5

## 2021-10-17 ENCOUNTER — Other Ambulatory Visit: Payer: Self-pay

## 2021-10-17 ENCOUNTER — Ambulatory Visit
Admission: RE | Admit: 2021-10-17 | Discharge: 2021-10-17 | Disposition: A | Discharge status: Home / Self Care | Payer: Medicare Other | Source: Ambulatory Visit | Attending: Radiation Oncology | Admitting: Radiation Oncology

## 2021-10-17 DIAGNOSIS — Z170 Estrogen receptor positive status [ER+]: Secondary | ICD-10-CM

## 2021-10-17 DIAGNOSIS — M79605 Pain in left leg: Secondary | ICD-10-CM

## 2021-10-17 DIAGNOSIS — C50412 Malignant neoplasm of upper-outer quadrant of left female breast: Secondary | ICD-10-CM | POA: Diagnosis not present

## 2021-10-17 DIAGNOSIS — Z510 Encounter for antineoplastic radiation therapy: Secondary | ICD-10-CM | POA: Diagnosis not present

## 2021-10-17 LAB — RAD ONC ARIA SESSION SUMMARY
Course Elapsed Days: 7
Plan Fractions Treated to Date: 6
Plan Prescribed Dose Per Fraction: 2.67 Gy
Plan Total Fractions Prescribed: 15
Plan Total Prescribed Dose: 40.05 Gy
Reference Point Dosage Given to Date: 16.02 Gy
Reference Point Session Dosage Given: 2.67 Gy
Session Number: 6

## 2021-10-18 ENCOUNTER — Other Ambulatory Visit: Payer: Self-pay

## 2021-10-18 ENCOUNTER — Ambulatory Visit
Admission: RE | Admit: 2021-10-18 | Discharge: 2021-10-18 | Disposition: A | Discharge status: Home / Self Care | Payer: Medicare Other | Source: Ambulatory Visit | Attending: Radiation Oncology | Admitting: Radiation Oncology

## 2021-10-18 ENCOUNTER — Telehealth: Payer: Self-pay | Admitting: *Deleted

## 2021-10-18 DIAGNOSIS — Z510 Encounter for antineoplastic radiation therapy: Secondary | ICD-10-CM | POA: Diagnosis not present

## 2021-10-18 DIAGNOSIS — C50412 Malignant neoplasm of upper-outer quadrant of left female breast: Secondary | ICD-10-CM | POA: Diagnosis not present

## 2021-10-18 DIAGNOSIS — Z170 Estrogen receptor positive status [ER+]: Secondary | ICD-10-CM | POA: Diagnosis not present

## 2021-10-18 LAB — RAD ONC ARIA SESSION SUMMARY
Course Elapsed Days: 8
Plan Fractions Treated to Date: 7
Plan Prescribed Dose Per Fraction: 2.67 Gy
Plan Total Fractions Prescribed: 15
Plan Total Prescribed Dose: 40.05 Gy
Reference Point Dosage Given to Date: 18.69 Gy
Reference Point Session Dosage Given: 2.67 Gy
Session Number: 7

## 2021-10-18 NOTE — Telephone Encounter (Signed)
CALLED PATIENT TO INFORM OF MRI'S FOR 10-21-21- ARRIVAL TIME- 6:30 PM @ [facility], NO RESTRICTIONS TO TEST, SPOKE WITH PATIENT AND SHE IS AWARE OF THESE SCANS ?

## 2021-10-19 ENCOUNTER — Other Ambulatory Visit: Payer: Self-pay

## 2021-10-19 ENCOUNTER — Ambulatory Visit
Admission: RE | Admit: 2021-10-19 | Discharge: 2021-10-19 | Disposition: A | Discharge status: Home / Self Care | Payer: Medicare Other | Source: Ambulatory Visit | Attending: Radiation Oncology | Admitting: Radiation Oncology

## 2021-10-19 DIAGNOSIS — C50412 Malignant neoplasm of upper-outer quadrant of left female breast: Secondary | ICD-10-CM | POA: Diagnosis not present

## 2021-10-19 DIAGNOSIS — Z510 Encounter for antineoplastic radiation therapy: Secondary | ICD-10-CM | POA: Diagnosis not present

## 2021-10-19 DIAGNOSIS — Z170 Estrogen receptor positive status [ER+]: Secondary | ICD-10-CM | POA: Diagnosis not present

## 2021-10-19 LAB — RAD ONC ARIA SESSION SUMMARY
Course Elapsed Days: 9
Plan Fractions Treated to Date: 8
Plan Prescribed Dose Per Fraction: 2.67 Gy
Plan Total Fractions Prescribed: 15
Plan Total Prescribed Dose: 40.05 Gy
Reference Point Dosage Given to Date: 21.36 Gy
Reference Point Session Dosage Given: 2.67 Gy
Session Number: 8

## 2021-10-20 ENCOUNTER — Ambulatory Visit
Admission: RE | Admit: 2021-10-20 | Discharge: 2021-10-20 | Disposition: A | Discharge status: Home / Self Care | Payer: Medicare Other | Source: Ambulatory Visit | Attending: Radiation Oncology | Admitting: Radiation Oncology

## 2021-10-20 ENCOUNTER — Other Ambulatory Visit: Payer: Self-pay

## 2021-10-20 DIAGNOSIS — Z510 Encounter for antineoplastic radiation therapy: Secondary | ICD-10-CM | POA: Diagnosis not present

## 2021-10-20 DIAGNOSIS — C50412 Malignant neoplasm of upper-outer quadrant of left female breast: Secondary | ICD-10-CM | POA: Diagnosis not present

## 2021-10-20 DIAGNOSIS — Z170 Estrogen receptor positive status [ER+]: Secondary | ICD-10-CM | POA: Diagnosis not present

## 2021-10-20 LAB — RAD ONC ARIA SESSION SUMMARY
Course Elapsed Days: 10
Plan Fractions Treated to Date: 9
Plan Prescribed Dose Per Fraction: 2.67 Gy
Plan Total Fractions Prescribed: 15
Plan Total Prescribed Dose: 40.05 Gy
Reference Point Dosage Given to Date: 24.03 Gy
Reference Point Session Dosage Given: 2.67 Gy
Session Number: 9

## 2021-10-21 ENCOUNTER — Ambulatory Visit (HOSPITAL_COMMUNITY)
Admission: RE | Admit: 2021-10-21 | Discharge: 2021-10-21 | Disposition: A | Discharge status: Home / Self Care | Payer: Medicare Other | Source: Ambulatory Visit | Attending: Radiation Oncology | Admitting: Radiation Oncology

## 2021-10-21 ENCOUNTER — Ambulatory Visit
Admission: RE | Admit: 2021-10-21 | Discharge: 2021-10-21 | Disposition: A | Discharge status: Home / Self Care | Payer: Medicare Other | Source: Ambulatory Visit | Attending: Radiation Oncology | Admitting: Radiation Oncology

## 2021-10-21 ENCOUNTER — Other Ambulatory Visit: Payer: Self-pay

## 2021-10-21 DIAGNOSIS — M5126 Other intervertebral disc displacement, lumbar region: Secondary | ICD-10-CM | POA: Diagnosis not present

## 2021-10-21 DIAGNOSIS — C50412 Malignant neoplasm of upper-outer quadrant of left female breast: Secondary | ICD-10-CM | POA: Insufficient documentation

## 2021-10-21 DIAGNOSIS — M4317 Spondylolisthesis, lumbosacral region: Secondary | ICD-10-CM | POA: Diagnosis not present

## 2021-10-21 DIAGNOSIS — Z510 Encounter for antineoplastic radiation therapy: Secondary | ICD-10-CM | POA: Diagnosis not present

## 2021-10-21 DIAGNOSIS — M79605 Pain in left leg: Secondary | ICD-10-CM | POA: Insufficient documentation

## 2021-10-21 DIAGNOSIS — C50919 Malignant neoplasm of unspecified site of unspecified female breast: Secondary | ICD-10-CM | POA: Diagnosis not present

## 2021-10-21 DIAGNOSIS — Z170 Estrogen receptor positive status [ER+]: Secondary | ICD-10-CM | POA: Insufficient documentation

## 2021-10-21 DIAGNOSIS — M419 Scoliosis, unspecified: Secondary | ICD-10-CM | POA: Diagnosis not present

## 2021-10-21 LAB — RAD ONC ARIA SESSION SUMMARY
Course Elapsed Days: 11
Plan Fractions Treated to Date: 10
Plan Prescribed Dose Per Fraction: 2.67 Gy
Plan Total Fractions Prescribed: 15
Plan Total Prescribed Dose: 40.05 Gy
Reference Point Dosage Given to Date: 26.7 Gy
Reference Point Session Dosage Given: 2.67 Gy
Session Number: 10

## 2021-10-21 IMAGING — MR MR LUMBAR SPINE WO/W CM
6 of 7 series · 32 of 48 positions shown · IV contrast (10 ML GADAVIST)
Comparison: [DATE]

CLINICAL DATA: Metastatic disease evaluation. Breast cancer with
question bone metastases. Back and left lower extremity pain

EXAM:
MRI LUMBAR SPINE WITHOUT AND WITH CONTRAST
TECHNIQUE: Multiplanar and multiecho pulse sequences of the lumbar spine were
obtained without and with intravenous contrast.
CONTRAST:  10mL GADAVIST GADOBUTROL 1 MMOL/ML IV SOLN

[Series 6: T1 · sagittal · 4.0mm · 0.88mm/px · 5 of 17 slices shown (1 of 2)]
[im 1/17]
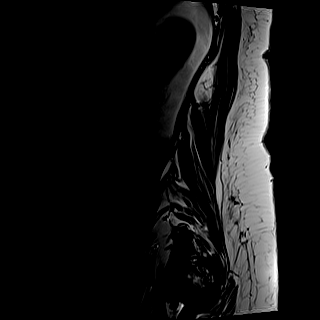
[im 5/17]
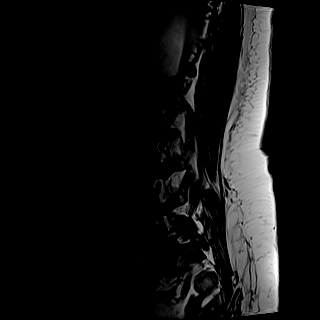
[im 9/17]
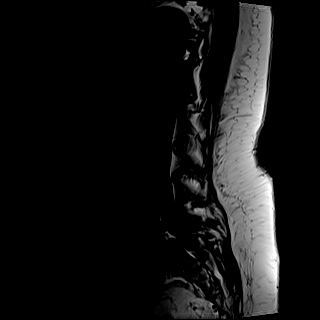
[im 13/17]
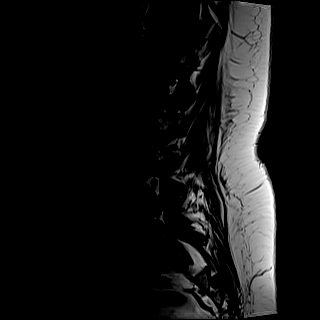
[im 17/17]
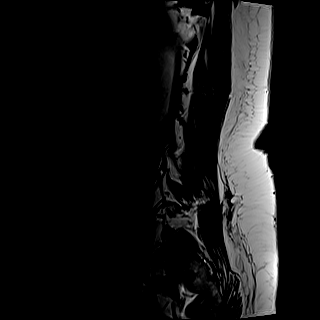

[Series 7: T2 · sagittal · 4.0mm · 0.88mm/px · 5 of 17 slices shown (1 of 2)]
[im 1/17]
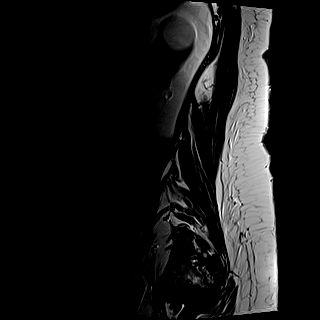
[im 5/17]
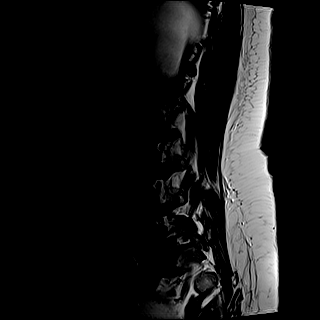
[im 9/17]
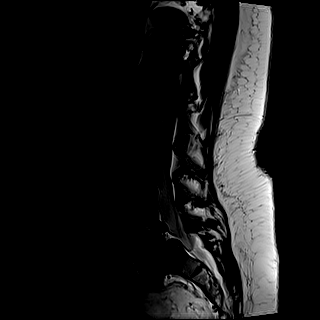
[im 13/17]
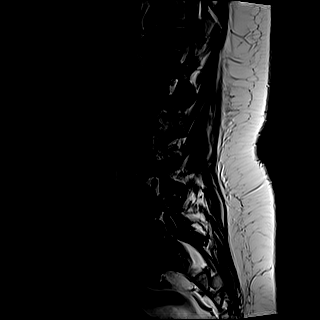
[im 17/17]
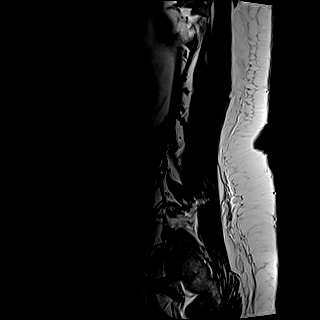

[Series 8: STIR · sagittal · 4.0mm · 0.51mm/px · 2 of 17 slices shown]
[im 1/17]
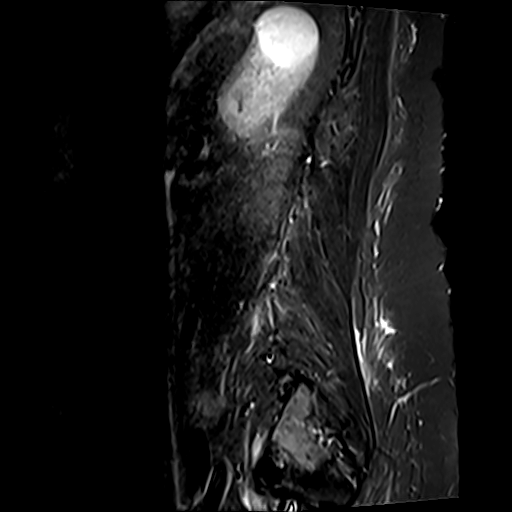
[im 6/17]
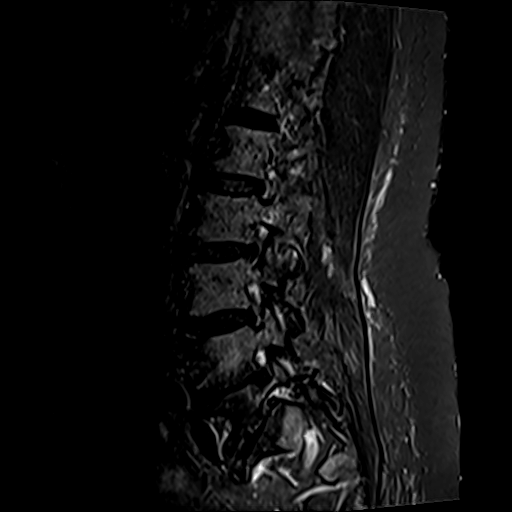

[Series 9: T2 · axial · 4.0mm · 0.62mm/px · z∈[-83,+144]mm · 8 of 40 slices shown (2 of 2)]
[im 1/40]
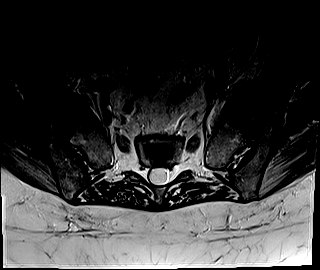
[im 5/40]
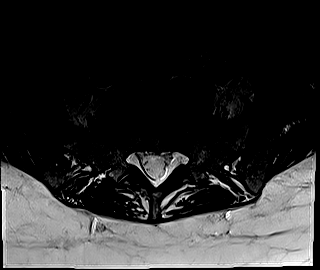
[im 14/40]
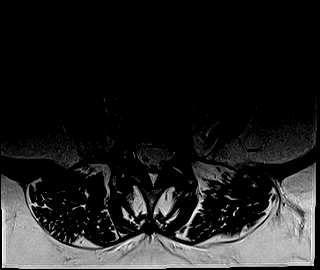
[im 18/40]
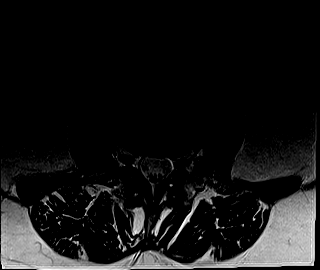
[im 22/40]
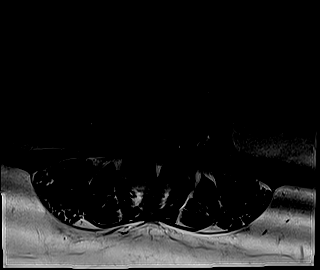
[im 27/40]
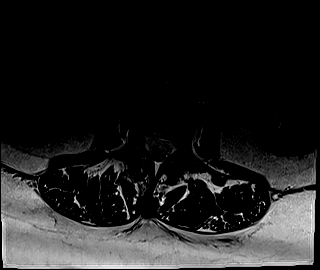
[im 35/40]
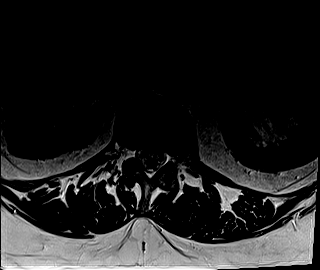
[im 40/40]
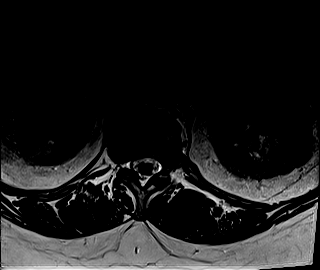

[Series 10: T1 · axial · 4.0mm · 0.39mm/px · z∈[-91,+145]mm · 8 of 40 slices shown (2 of 2)]
[im 1/40]
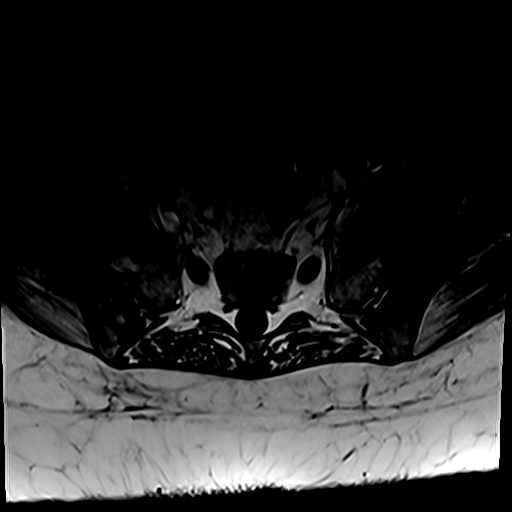
[im 5/40]
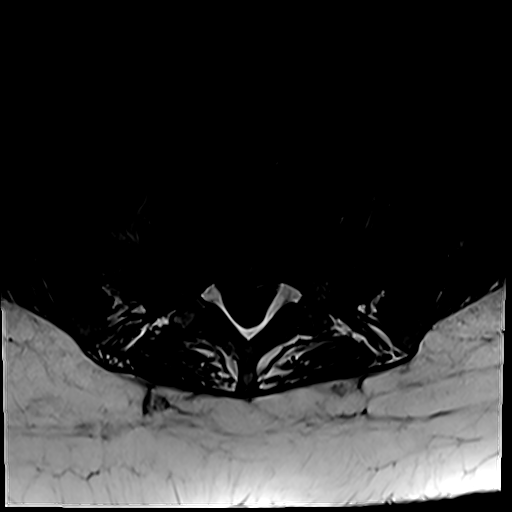
[im 14/40]
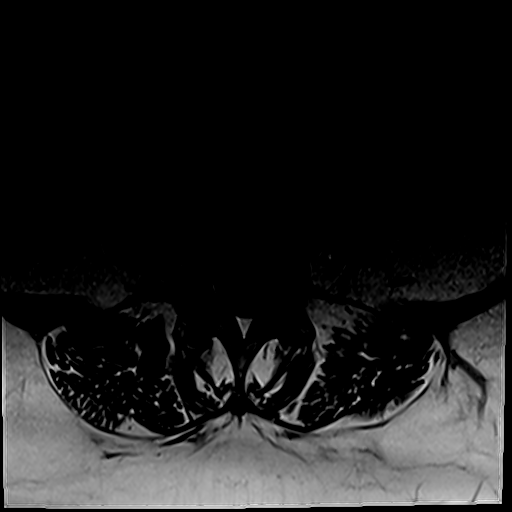
[im 18/40]
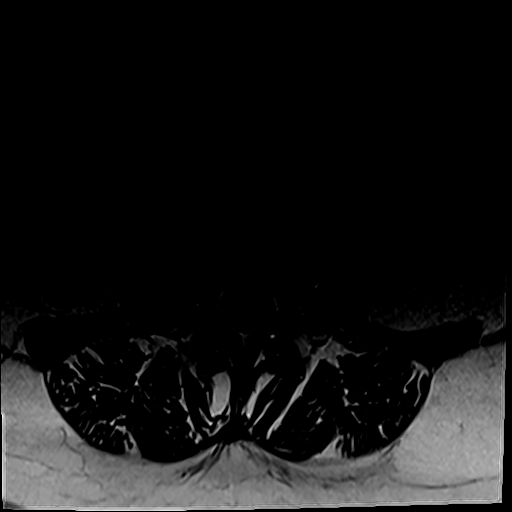
[im 22/40]
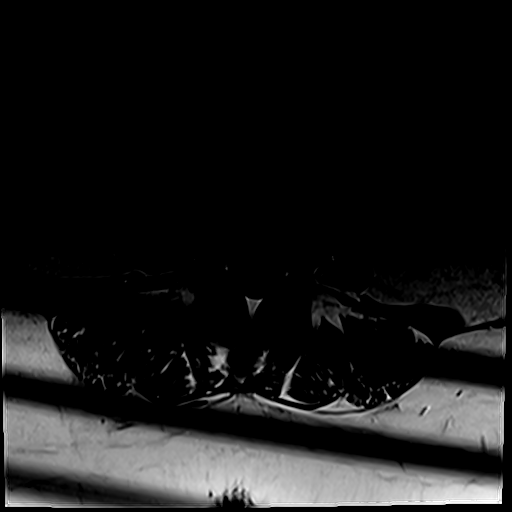
[im 27/40]
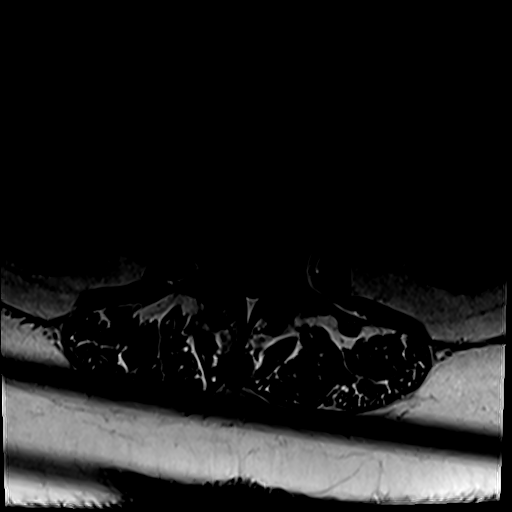
[im 35/40]
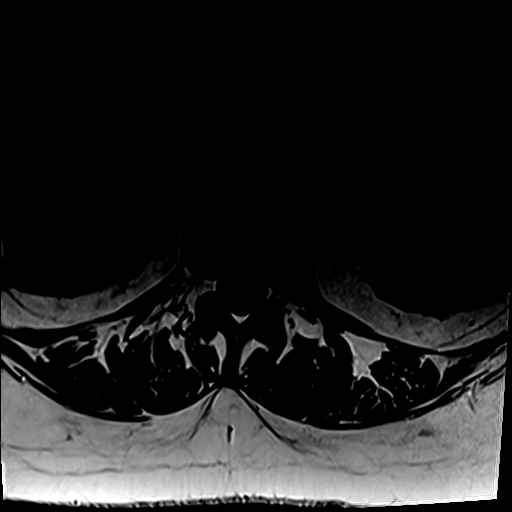
[im 40/40]
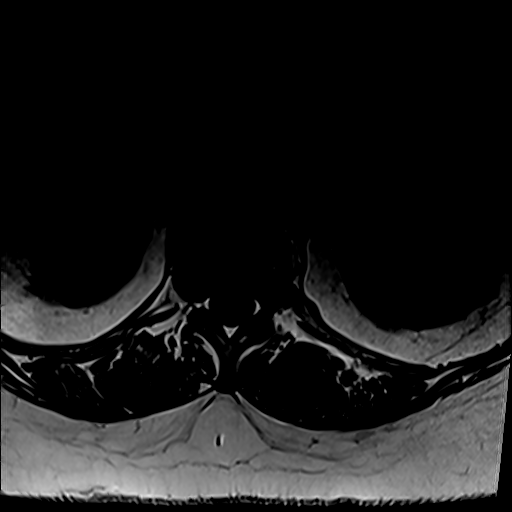

[Series 11: T1 fat-sat post-contrast · sagittal · 4.0mm · 0.88mm/px · 4 of 17 slices shown]
[im 1/17]
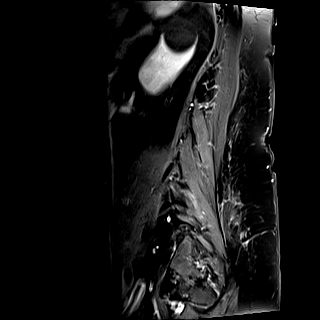
[im 6/17]
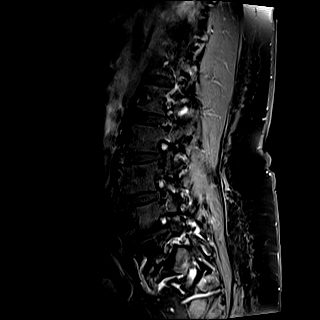
[im 11/17]
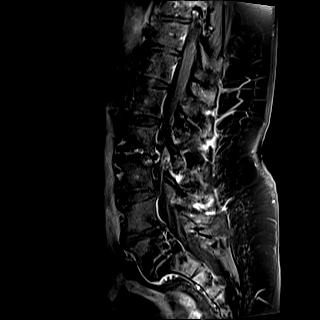
[im 17/17]
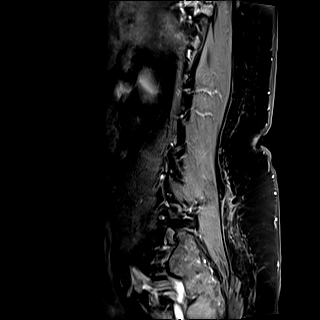

[32 of 48 positions shown; findings below may reference images not displayed]

FINDINGS: Segmentation:  5 lumbar type vertebrae

Alignment:  L5-S1 grade [DATE] anterolisthesis.  Mild scoliosis.

Vertebrae: Chronic L5 pars defects. No evidence of fracture,
discitis, or bone lesion. Sacroiliac osteoarthritis.

Conus medullaris and cauda equina: Conus extends to the L1 level.
Conus and cauda equina appear normal. Small Tarlov cyst at S2-3.

Paraspinal and other soft tissues: Negative for perispinal mass or
inflammation.

Disc levels:

T12- L1: Unremarkable.

L1-L2: Mild disc bulging.  Mild facet spurring

L2-L3: Disc narrowing and bulging with mild facet spurring. The
canal and foramina are patent

L3-L4: Degenerative facet spurring, bulky on the right. Mild disc
bulging.

L4-L5: Degenerative disc collapse with endplate and facet spurring
bilaterally. The canal and foramina are patent

L5-S1:L5 chronic pars defects with anterolisthesis, disc collapse,
disc bulging. Degenerative facet spurring. Biforaminal L5
compression.
IMPRESSION: 1. L5 chronic pars defects with L5-S1 anterolisthesis and
degeneration causing biforaminal L5 compression.
2. Noncompressive spinal degeneration elsewhere.  Mild scoliosis.
3. Negative for metastatic disease.

## 2021-10-21 IMAGING — MR MR SACRUM / SI JOINTS WO/W CM
9 series · 48 of 48 positions shown · IV contrast (10 ML GADAVIST)
Comparison: Lumbar spine radiographs [DATE], MRI lumbar spine
[DATE]

CLINICAL DATA: Metastatic disease evaluation. History of breast
cancer. Evaluate for bone metastases.

EXAM:
MRI LUMBAR SPINE WITHOUT AND WITH CONTRAST
TECHNIQUE: Multiplanar and multiecho pulse sequences of the lumbar spine were
obtained without and with intravenous contrast.
CONTRAST:  10mL GADAVIST GADOBUTROL 1 MMOL/ML IV SOLN

[Series 8: STIR · oblique · 3.0mm · 0.45mm/px · 6 of 39 slices shown]
[im 1/39]
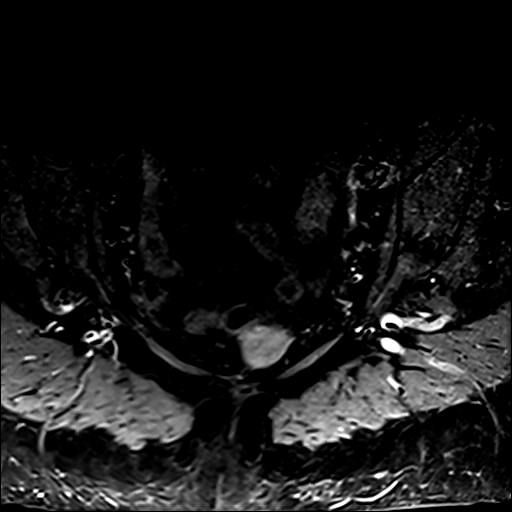
[im 8/39]
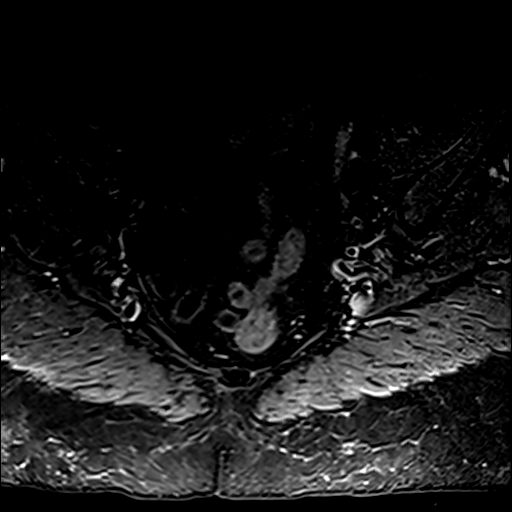
[im 16/39]
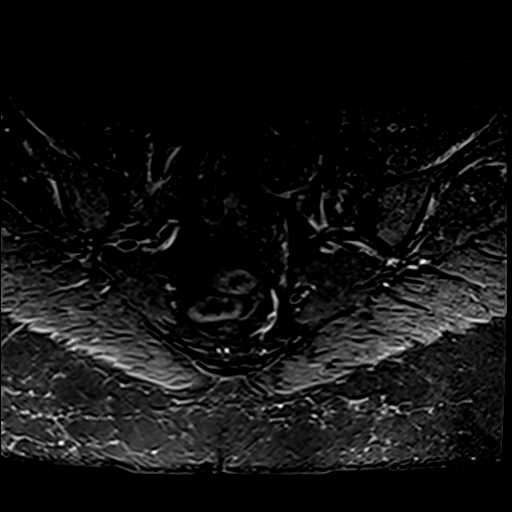
[im 23/39]
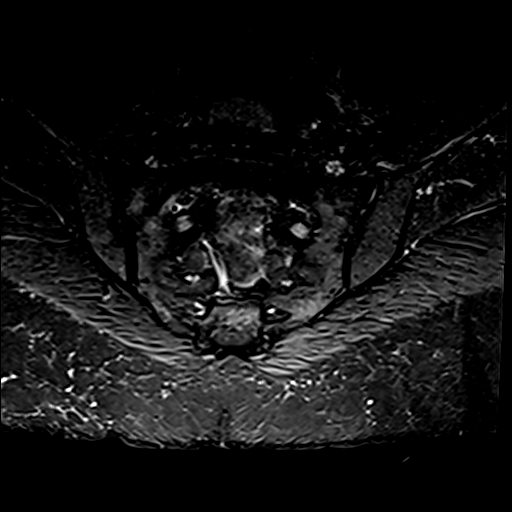
[im 31/39]
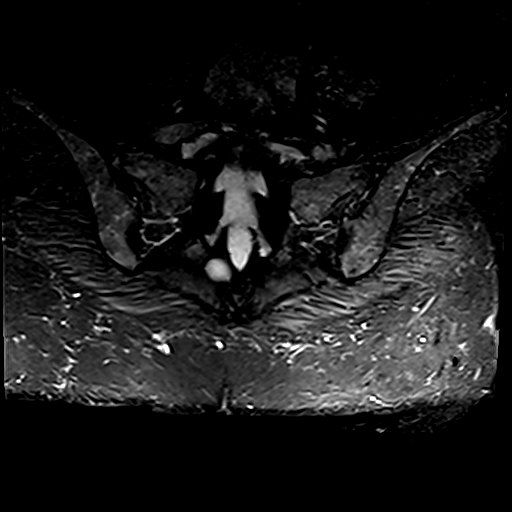
[im 39/39]
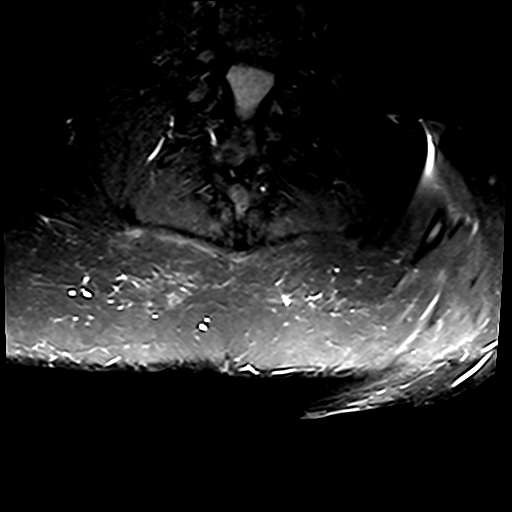

[Series 9: T1 · oblique · 3.0mm · 0.90mm/px · 6 of 39 slices shown (1 of 3)]
[im 1/39]
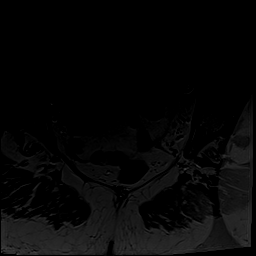
[im 8/39]
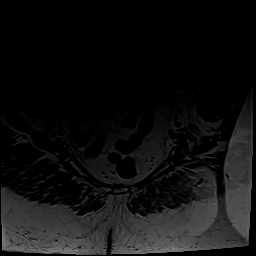
[im 16/39]
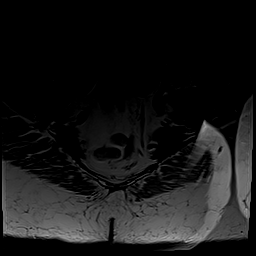
[im 23/39]
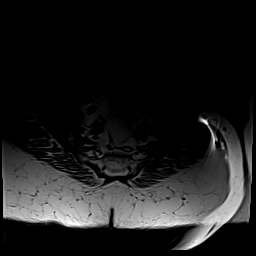
[im 31/39]
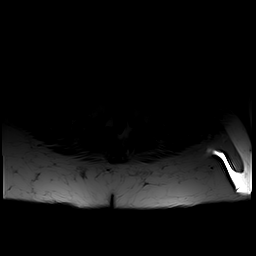
[im 39/39]
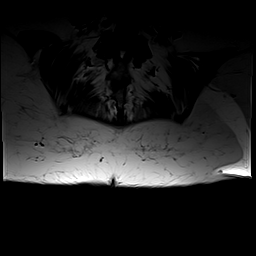

[Series 10: T1 · axial · 4.0mm · 1.02mm/px · z∈[-188,-41]mm · 5 of 36 slices shown (2 of 3)]
[im 1/36]
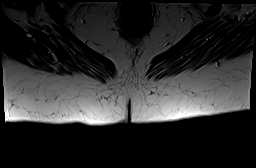
[im 9/36]
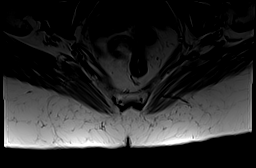
[im 18/36]
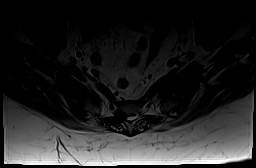
[im 27/36]
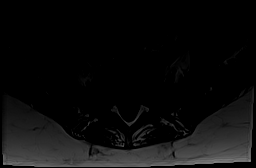
[im 36/36]
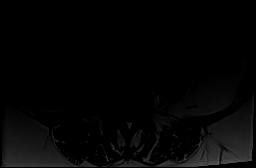

[Series 11: T2 fat-sat · axial · 4.0mm · 1.17mm/px · z∈[-221,-75]mm · 5 of 36 slices shown (1 of 2)]
[im 1/36]
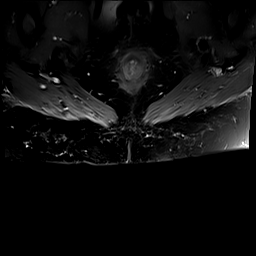
[im 9/36]
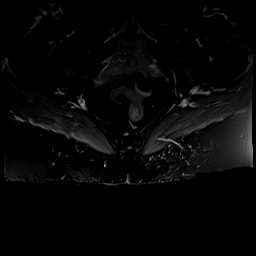
[im 18/36]
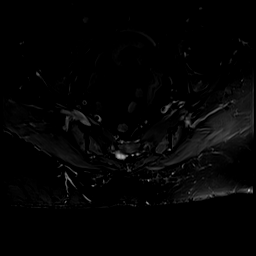
[im 27/36]
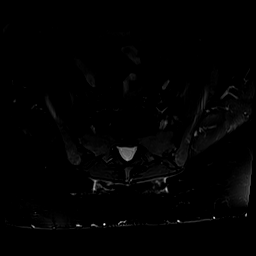
[im 36/36]
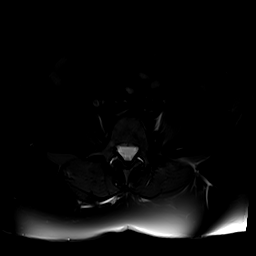

[Series 12: T1 · oblique · 3.0mm · 0.90mm/px · 6 of 39 slices shown (3 of 3)]
[im 1/39]
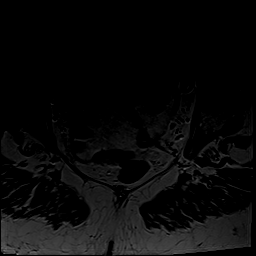
[im 8/39]
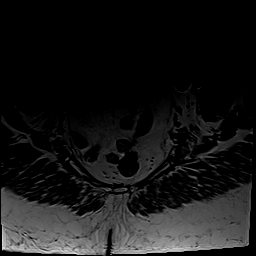
[im 16/39]
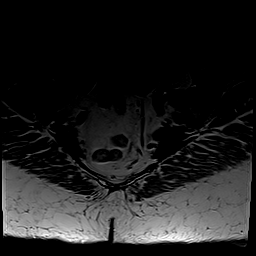
[im 23/39]
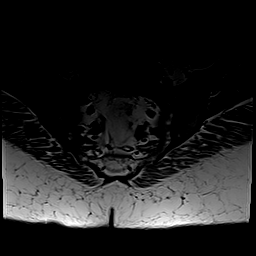
[im 31/39]
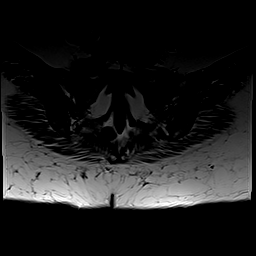
[im 39/39]
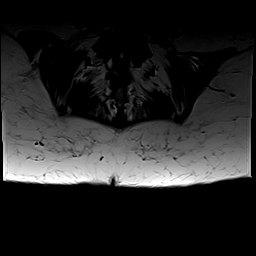

[Series 13: T2 fat-sat · sagittal · 4.0mm · 0.94mm/px · 4 of 30 slices shown (2 of 2)]
[im 1/30]
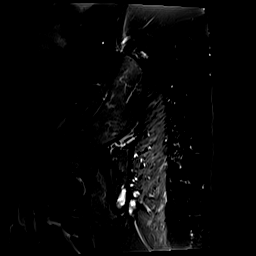
[im 10/30]
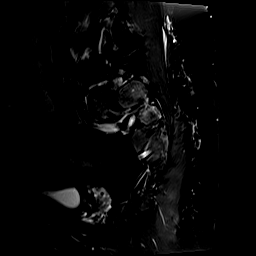
[im 20/30]
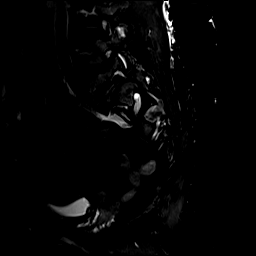
[im 30/30]
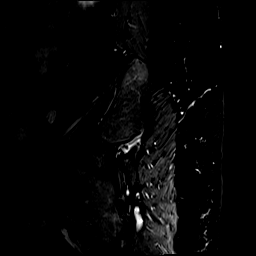

[Series 14: T1 fat-sat · axial · non-contrast · 4.0mm · 1.17mm/px · z∈[-221,-75]mm · 5 of 36 slices shown]
[im 1/36]
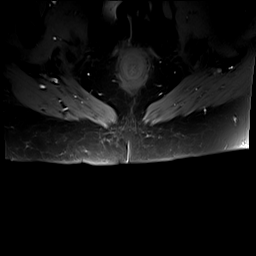
[im 9/36]
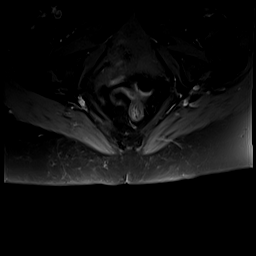
[im 18/36]
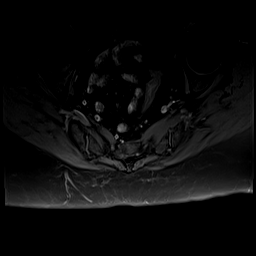
[im 27/36]
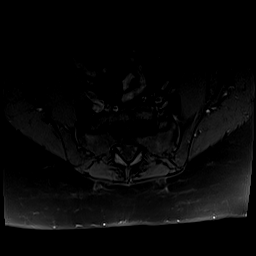
[im 36/36]
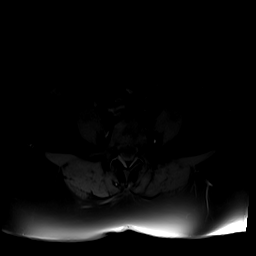

[Series 15: T1 fat-sat post-contrast · axial · 4.0mm · 1.17mm/px · z∈[-221,-75]mm · 5 of 36 slices shown (1 of 2)]
[im 1/36]
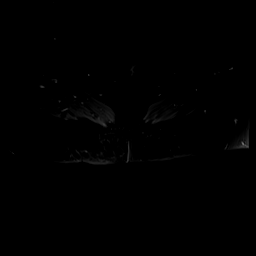
[im 9/36]
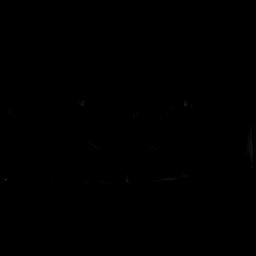
[im 18/36]
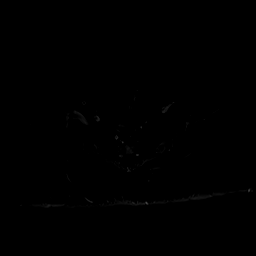
[im 27/36]
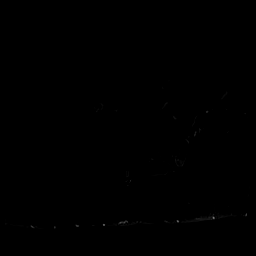
[im 36/36]
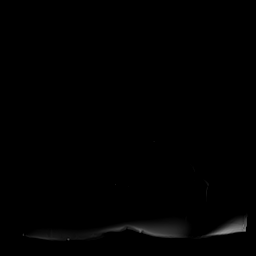

[Series 16: T1 fat-sat post-contrast · oblique · 3.0mm · 0.90mm/px · 6 of 39 slices shown (2 of 2)]
[im 1/39]
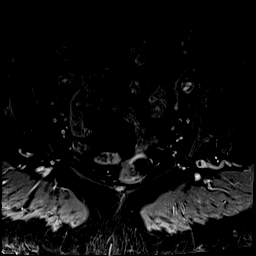
[im 8/39]
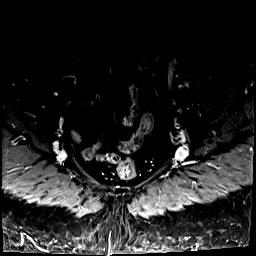
[im 16/39]
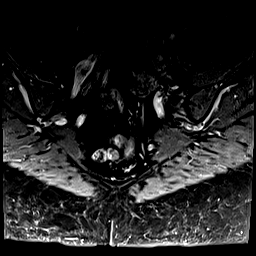
[im 23/39]
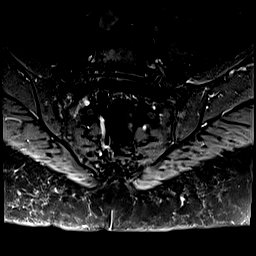
[im 31/39]
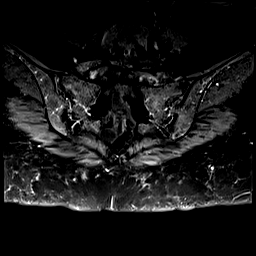
[im 39/39]
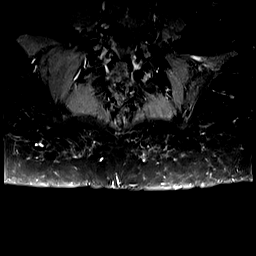

[48 of 48 positions shown; findings below may reference images not displayed]

FINDINGS: Segmentation: Physiologic.

Alignment: There is again grade 1 anterolisthesis of L5 on S1.
Moderate posterior L5-S1 disc space narrowing. Mild anterior greater
than posterior L4-5 disc space narrowing. L4-5 and L5-S1
degenerative endplate osteophytes. Incidental note of
right-greater-than-left Tarlov cysts at the S2 vertebral body level,
involving the S2 nerves.

Vertebrae: The visualized lumbosacral and coccygeal vertebral body
heights are maintained.

Paraspinal and other soft tissues: Unremarkable.

Bones:

There moderate degenerative changes of the bilateral sacroiliac
joints including joint space narrowing, subchondral decreased T1 and
decreased T2 signal sclerosis, and mild subchondral fat chronic
changes.

Moderate joint space narrowing, chondral irregularity, and
peripheral osteophytosis degenerative changes of the pubic
symphysis.

No aggressive bone marrow lesion is seen.
IMPRESSION: :
IMPRESSION: 1. Moderate osteoarthritis of the bilateral sacroiliac joints and
the pubic symphysis. No aggressive bone marrow lesion is seen.
2. Grade 1 anterolisthesis of L5 on S1, similar to prior.

## 2021-10-21 MED ORDER — GADOBUTROL 1 MMOL/ML IV SOLN
10.0000 mL | Freq: Once | INTRAVENOUS | Status: AC | PRN
  Administered 2021-10-21: 10 mL via INTRAVENOUS

## 2021-10-24 ENCOUNTER — Other Ambulatory Visit: Payer: Self-pay

## 2021-10-24 ENCOUNTER — Inpatient Hospital Stay: Payer: Medicare Other

## 2021-10-24 ENCOUNTER — Encounter: Payer: Self-pay | Admitting: Hematology

## 2021-10-24 ENCOUNTER — Inpatient Hospital Stay (HOSPITAL_BASED_OUTPATIENT_CLINIC_OR_DEPARTMENT_OTHER): Payer: Medicare Other | Admitting: Hematology

## 2021-10-24 ENCOUNTER — Ambulatory Visit
Admission: RE | Admit: 2021-10-24 | Discharge: 2021-10-24 | Disposition: A | Discharge status: Home / Self Care | Payer: Medicare Other | Source: Ambulatory Visit | Attending: Radiation Oncology | Admitting: Radiation Oncology

## 2021-10-24 VITALS — BP 127/76 | HR 79 | Temp 98.2°F | Resp 18 | Ht 64.0 in | Wt 217.2 lb

## 2021-10-24 DIAGNOSIS — Z170 Estrogen receptor positive status [ER+]: Secondary | ICD-10-CM

## 2021-10-24 DIAGNOSIS — R531 Weakness: Secondary | ICD-10-CM | POA: Insufficient documentation

## 2021-10-24 DIAGNOSIS — M545 Low back pain, unspecified: Secondary | ICD-10-CM | POA: Insufficient documentation

## 2021-10-24 DIAGNOSIS — Z510 Encounter for antineoplastic radiation therapy: Secondary | ICD-10-CM | POA: Insufficient documentation

## 2021-10-24 DIAGNOSIS — C50412 Malignant neoplasm of upper-outer quadrant of left female breast: Secondary | ICD-10-CM

## 2021-10-24 LAB — CMP (CANCER CENTER ONLY)
ALT: 13 U/L (ref 0–44)
AST: 13 U/L — ABNORMAL LOW (ref 15–41)
Albumin: 3.6 g/dL (ref 3.5–5.0)
Alkaline Phosphatase: 51 U/L (ref 38–126)
Anion gap: 8 (ref 5–15)
BUN: 12 mg/dL (ref 8–23)
CO2: 23 mmol/L (ref 22–32)
Calcium: 10.4 mg/dL — ABNORMAL HIGH (ref 8.9–10.3)
Chloride: 110 mmol/L (ref 98–111)
Creatinine: 0.66 mg/dL (ref 0.44–1.00)
GFR, Estimated: >60 mL/min (ref >60)
Glucose, Bld: 114 mg/dL — ABNORMAL HIGH (ref 70–99)
Potassium: 3.9 mmol/L (ref 3.5–5.1)
Sodium: 141 mmol/L (ref 135–145)
Total Bilirubin: 0.7 mg/dL (ref 0.3–1.2)
Total Protein: 6.1 g/dL — ABNORMAL LOW (ref 6.5–8.1)

## 2021-10-24 LAB — CBC WITH DIFFERENTIAL (CANCER CENTER ONLY)
Abs Immature Granulocytes: 0.01 10*3/uL (ref 0.00–0.07)
Basophils Absolute: 0.1 10*3/uL (ref 0.0–0.1)
Basophils Relative: 2 %
Eosinophils Absolute: 0.2 10*3/uL (ref 0.0–0.5)
Eosinophils Relative: 5 %
HCT: 35 % — ABNORMAL LOW (ref 36.0–46.0)
Hemoglobin: 11.4 g/dL — ABNORMAL LOW (ref 12.0–15.0)
Immature Granulocytes: 0 %
Lymphocytes Relative: 25 %
Lymphs Abs: 0.9 10*3/uL (ref 0.7–4.0)
MCH: 30.6 pg (ref 26.0–34.0)
MCHC: 32.6 g/dL (ref 30.0–36.0)
MCV: 94.1 fL (ref 80.0–100.0)
Monocytes Absolute: 0.6 10*3/uL (ref 0.1–1.0)
Monocytes Relative: 15 %
Neutro Abs: 2 10*3/uL (ref 1.7–7.7)
Neutrophils Relative %: 53 %
Platelet Count: 298 10*3/uL (ref 150–400)
RBC: 3.72 MIL/uL — ABNORMAL LOW (ref 3.87–5.11)
RDW: 16.4 % — ABNORMAL HIGH (ref 11.5–15.5)
WBC Count: 3.7 10*3/uL — ABNORMAL LOW (ref 4.0–10.5)
nRBC: 0 % (ref 0.0–0.2)

## 2021-10-24 LAB — RAD ONC ARIA SESSION SUMMARY
Course Elapsed Days: 14
Plan Fractions Treated to Date: 11
Plan Prescribed Dose Per Fraction: 2.67 Gy
Plan Total Fractions Prescribed: 15
Plan Total Prescribed Dose: 40.05 Gy
Reference Point Dosage Given to Date: 29.37 Gy
Reference Point Session Dosage Given: 2.67 Gy
Session Number: 11

## 2021-10-24 MED ORDER — TAMOXIFEN CITRATE 20 MG PO TABS: 20.0000 mg | ORAL_TABLET | Freq: Every day | ORAL | 3 refills | Status: DC

## 2021-10-24 NOTE — Progress Notes (Signed)
?[facility]   ?[phone] [phone]   ?Clinic Follow up Note  ? ?Patient Care Team: ?[name], Standley Brooking, MD as PCP - General (Internal Medicine) ?Valinda Party, MD as Referring Physician (Rheumatology) ?Donnie Mesa, MD as Consulting Physician (General Surgery) ?Truitt Merle, MD as Consulting Physician (Hematology) ?Eppie Gibson, MD as Attending Physician (Radiation Oncology) ?Mauro Kaufmann, RN as Oncology Nurse Navigator ?Rockwell Germany, RN as Oncology Nurse Navigator ?Magnus Sinning, MD as Consulting Physician (Physical Medicine and Rehabilitation) ?Nigel Mormon, MD as Consulting Physician (Cardiology) ?Renato Shin, MD as Consulting Physician (Endocrinology) ? ?Date of Service:  10/24/2021 ? ?CHIEF COMPLAINT: f/u of left breast cancer ? ?CURRENT THERAPY:  ?Adjuvant radiation, 4/17-5/12/23 ? ?ASSESSMENT & PLAN:  ?Christy Young is a 76 y.o. post-hysterectomy female with  ? ?1. 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 Left lumpectomy on 06/07/21 by Dr. Georgette Dover showed grade 3 IDC to both tumors (2.5 cm and 0.8 cm) with small foci of DCIS. Margins and lymph node were negative. ?-Oncotype Dx recurrence score of 29, high risk ?-she began adjuvant TC on 07/15/21. She experienced significant side effects, but completed 4 cycles on 09/15/21. ?-she is currently receiving radiation therapy under Dr. Isidore Moos, scheduled to finish on 11/04/21. ?---Giving her strongly ER and PR positivity of the tumor cells, and her arthralgia, I recommend adjuvant endocrine therapy with tamoxifen. The potential side effects, which includes but not limited to, hot flash, skin and vaginal dryness, slightly increased risk of cardiovascular disease and cataract, small risk of thrombosis and endometrial cancer, were discussed with her in great details.  Preventive strategies for thrombosis, such as being physically active, using compression stocks, avoid cigarette smoking, etc., were reviewed with her. I also recommend her to follow-up with her gynecologist once a year, and watch for vaginal spotting or bleeding, as a clinically sign of endometrial cancer, etc. She voiced good understanding, and agrees to proceed. Will start after she completes adjuvant breast radiation. ?-We also reviewed the breast cancer surveillance, including annual mammogram, and physical exam.  ?-she continues to recover from chemo. Labs reviewed, overall stable to improved.  ?  ?2. Bone Health, Hypercalcemia ?-Her most recent DEXA was 06/08/20 showing -0.8 (normal) ?-I explained that tamoxifen can increase bone density. ?-she has a history of elevated calcium, slightly elevated today at 10.4 (10/24/21) ? ?3. Low Back Pain, Weakness ?-lumbar spine MRI on 10/21/21 was negative for metastatic disease, showed L5 chronic pars defects with L5-S1 anterolisthesis and degeneration causing biforaminal L5 compression. ?-she is scheduled for epidural on 11/10/21 with Dr. Ernestina Patches. ?  ?  ?PLAN:  ?-start tamoxifen in 3-4 weeks, I called in today  ?-survivorship in 3 months ?-lab and f/u in 6 months ? ? ?No problem-specific Assessment & Plan notes found for this encounter. ? ? ?SUMMARY OF ONCOLOGIC HISTORY: ?Oncology History Overview Note  ? Cancer Staging  ?Malignant neoplasm of upper-outer quadrant of left breast in female, estrogen receptor positive ([place]) ?Staging form: Breast, AJCC 8th Edition ?- Clinical stage from 06/01/2021: Stage IIA (cT2, cN0, cM0, G3, ER+, PR+, HER2-) - Unsigned ? ? ? ?  ?Malignant neoplasm of upper-outer quadrant of left breast in female, estrogen receptor positive ([place])  ?05/02/2021 Mammogram  ? Exam: 3D Mammogram Diagnostic Bilateral ?IMPRESSION: ?Palpable 2.4 cm irregular high density mass in the upper-outer left breast. ? ?Exam: Breast Ultrasound - Left ?  IMPRESSION: ?1. 2.3 cm  irregular mass in left breast at 1 o'clock posterior depth, 13 mc from nipple. ?2. 0.9 cm irregular mass in left breast at 1 o'clock posterior depth, 10 cm from nipple ?3. Three small 3-4 mm round masses favored to represent small cysts seen at 2:30-3:00 11-12 cm from nipple. ?4. No abnormal nodes in left axilla. ?  ?05/23/2021 Initial Biopsy  ? Diagnosis ?1. Breast, left, needle core biopsy, breast mass, 2.3 cm @ 1:00 13 CMFN ? - INVASIVE DUCTAL CARCINOMA, grade 3  ?2. Breast, left, needle core biopsy, breast mass, 0.9 cm @ 1:00 10 CMFN ? - INVASIVE DUCTAL CARCINOMA, grade 1 ? ?1. PROGNOSTIC INDICATORS ?Results: ?The tumor cells are EQUIVOCAL for Her2 (2+) ?Estrogen Receptor: 100%, POSITIVE, STRONG STAINING INTENSITY ?Progesterone Receptor: 60%, POSITIVE, STRONG STAINING INTENSITY ?Proliferation Marker Ki67: 40% ? ?1. FLUORESCENCE IN-SITU HYBRIDIZATION ?Results: ?GROUP 5: HER2 **NEGATIVE** ? ?2. PROGNOSTIC INDICATORS ?Results: ?The tumor cells are NEGATIVE for Her2 (1+) ?Estrogen Receptor: 100%, POSITIVE, STRONG STAINING INTENSITY ?Progesterone Receptor: 100%, POSITIVE, STRONG STAINING INTENSITY ?Proliferation Marker Ki67: 10% ?  ?05/30/2021 Initial Diagnosis  ? Malignant neoplasm of upper-outer quadrant of left breast in female, estrogen receptor positive ([place]) ?  ?06/07/2021 Definitive Surgery  ? FINAL MICROSCOPIC DIAGNOSIS:  ? ?A. BREAST, LEFT, LUMPECTOMY:  ?Invasive ductal carcinomas (two independent lesions, 2.5 cm and 0.8 cm) with clear margins of resection.  ?Please see the synoptic report after specimen B.  ? ?B. LYMPH NODE, LEFT AXILLARY #1, SENTINEL, EXCISION:  ?Hyperplastic lymph node with fatty infiltration which is negative for  ?metastatic carcinoma.  ?  ?06/07/2021 Oncotype testing  ? Oncotype DX was obtained on the final surgical sample and the recurrence score of 28 predicts a risk of recurrence outside the breast over the next 9 years of 18%, if the patient's only systemic therapy is an  antiestrogen for 5 years.  It also predicts a significant benefit from chemotherapy.  ?  ?06/07/2021 Cancer Staging  ? Staging form: Breast, AJCC 8th Edition ?- Pathologic stage from 06/07/2021: Stage IB (pT2, pN0, cM0, G3, ER+, PR+, HER2-, Oncotype DX score: 29) - Signed by Truitt Merle, MD on 08/05/2021 ?Stage prefix: Initial diagnosis ?Multigene prognostic tests performed: Oncotype DX ?Recurrence score range: Greater than or equal to 11 ?Histologic grading system: 3 grade system ? ?  ?07/15/2021 -  Chemotherapy  ? Patient is on Treatment Plan : BREAST TC q21d  ? ?  ?  ? ? ? ?INTERVAL HISTORY:  ?Christy Young is here for a follow up of breast cancer. She was last seen by me on 09/15/21. She presents to the clinic alone. ?She reports she is still weak. She tells me she is scheduled for epidural for back pain soon. ?  ?All other systems were reviewed with the patient and are negative. ? ?MEDICAL HISTORY:  ?Past Medical History:  ?Diagnosis Date  ? ARTHRITIS   ? Breast cancer ([place])   ? Carpal tunnel syndrome 08/23/2015  ? Left  ? Exertional dyspnea   ? Fatigue   ? Gout, unspecified   ? Hyperlipidemia   ? Hypertension   ? meds since age 88   ? Hyperuricemia   ? Nonspecific abnormal electrocardiogram (ECG) (EKG)   ? t wave non acute    ? OBESITY   ? Palpitations   ? hospitalized 2005 felt from stress neg cards eval  ? Primary hyperparathyroidism ([place])   ? 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 ? ?  SURGICAL HISTORY: ?Past Surgical History:  ?Procedure Laterality Date  ? ABDOMINAL HYSTERECTOMY    ? BREAST BIOPSY    ? right side  ? BREAST LUMPECTOMY WITH RADIOACTIVE SEED AND SENTINEL LYMPH NODE BIOPSY Left 06/07/2021  ? Procedure: LEFT BREAST LUMPECTOMY WITH RADIOACTIVE SEED X2 AND AXILLARY SENTINEL LYMPH NODE BIOPSY;  Surgeon: Donnie Mesa, MD;  Location: [facility];  Service: General;  Laterality: Left;  ? COLONOSCOPY    ? FACIAL COSMETIC SURGERY  1980  ? ? ?I have reviewed the social history and family  history with the patient and they are unchanged from previous note. ? ?ALLERGIES:  is allergic to pravastatin, rosuvastatin, and penicillins. ? ?MEDICATIONS:  ?Current Outpatient Medications  ?Medication Sig Dispense Refill

## 2021-10-25 ENCOUNTER — Other Ambulatory Visit: Payer: Self-pay

## 2021-10-25 ENCOUNTER — Telehealth: Payer: Self-pay | Admitting: Hematology

## 2021-10-25 ENCOUNTER — Ambulatory Visit
Admission: RE | Admit: 2021-10-25 | Discharge: 2021-10-25 | Disposition: A | Discharge status: Home / Self Care | Payer: Medicare Other | Source: Ambulatory Visit | Attending: Radiation Oncology | Admitting: Radiation Oncology

## 2021-10-25 ENCOUNTER — Other Ambulatory Visit: Payer: Self-pay | Admitting: Cardiology

## 2021-10-25 DIAGNOSIS — R531 Weakness: Secondary | ICD-10-CM | POA: Diagnosis not present

## 2021-10-25 DIAGNOSIS — Z170 Estrogen receptor positive status [ER+]: Secondary | ICD-10-CM | POA: Diagnosis not present

## 2021-10-25 DIAGNOSIS — Z510 Encounter for antineoplastic radiation therapy: Secondary | ICD-10-CM | POA: Diagnosis not present

## 2021-10-25 DIAGNOSIS — C50412 Malignant neoplasm of upper-outer quadrant of left female breast: Secondary | ICD-10-CM | POA: Diagnosis not present

## 2021-10-25 DIAGNOSIS — R0609 Other forms of dyspnea: Secondary | ICD-10-CM

## 2021-10-25 DIAGNOSIS — M545 Low back pain, unspecified: Secondary | ICD-10-CM | POA: Diagnosis not present

## 2021-10-25 LAB — RAD ONC ARIA SESSION SUMMARY
Course Elapsed Days: 15
Plan Fractions Treated to Date: 12
Plan Prescribed Dose Per Fraction: 2.67 Gy
Plan Total Fractions Prescribed: 15
Plan Total Prescribed Dose: 40.05 Gy
Reference Point Dosage Given to Date: 32.04 Gy
Reference Point Session Dosage Given: 2.67 Gy
Session Number: 12

## 2021-10-25 NOTE — Telephone Encounter (Signed)
Left message with follow-up appointments per 5/1 los. ?

## 2021-10-26 ENCOUNTER — Ambulatory Visit
Admission: RE | Admit: 2021-10-26 | Discharge: 2021-10-26 | Disposition: A | Discharge status: Home / Self Care | Payer: Medicare Other | Source: Ambulatory Visit | Attending: Radiation Oncology | Admitting: Radiation Oncology

## 2021-10-26 ENCOUNTER — Other Ambulatory Visit: Payer: Self-pay

## 2021-10-26 DIAGNOSIS — C50412 Malignant neoplasm of upper-outer quadrant of left female breast: Secondary | ICD-10-CM | POA: Diagnosis not present

## 2021-10-26 DIAGNOSIS — M545 Low back pain, unspecified: Secondary | ICD-10-CM | POA: Diagnosis not present

## 2021-10-26 DIAGNOSIS — R531 Weakness: Secondary | ICD-10-CM | POA: Diagnosis not present

## 2021-10-26 DIAGNOSIS — Z170 Estrogen receptor positive status [ER+]: Secondary | ICD-10-CM | POA: Diagnosis not present

## 2021-10-26 DIAGNOSIS — Z510 Encounter for antineoplastic radiation therapy: Secondary | ICD-10-CM | POA: Diagnosis not present

## 2021-10-26 LAB — RAD ONC ARIA SESSION SUMMARY
Course Elapsed Days: 16
Plan Fractions Treated to Date: 13
Plan Prescribed Dose Per Fraction: 2.67 Gy
Plan Total Fractions Prescribed: 15
Plan Total Prescribed Dose: 40.05 Gy
Reference Point Dosage Given to Date: 34.71 Gy
Reference Point Session Dosage Given: 2.67 Gy
Session Number: 13

## 2021-10-27 ENCOUNTER — Ambulatory Visit
Admission: RE | Admit: 2021-10-27 | Discharge: 2021-10-27 | Disposition: A | Discharge status: Home / Self Care | Payer: Medicare Other | Source: Ambulatory Visit | Attending: Radiation Oncology | Admitting: Radiation Oncology

## 2021-10-27 ENCOUNTER — Other Ambulatory Visit: Payer: Self-pay

## 2021-10-27 DIAGNOSIS — C50412 Malignant neoplasm of upper-outer quadrant of left female breast: Secondary | ICD-10-CM | POA: Diagnosis not present

## 2021-10-27 DIAGNOSIS — R531 Weakness: Secondary | ICD-10-CM | POA: Diagnosis not present

## 2021-10-27 DIAGNOSIS — Z510 Encounter for antineoplastic radiation therapy: Secondary | ICD-10-CM | POA: Diagnosis not present

## 2021-10-27 DIAGNOSIS — Z170 Estrogen receptor positive status [ER+]: Secondary | ICD-10-CM | POA: Diagnosis not present

## 2021-10-27 DIAGNOSIS — M545 Low back pain, unspecified: Secondary | ICD-10-CM | POA: Diagnosis not present

## 2021-10-27 LAB — RAD ONC ARIA SESSION SUMMARY
Course Elapsed Days: 17
Plan Fractions Treated to Date: 14
Plan Prescribed Dose Per Fraction: 2.67 Gy
Plan Total Fractions Prescribed: 15
Plan Total Prescribed Dose: 40.05 Gy
Reference Point Dosage Given to Date: 37.38 Gy
Reference Point Session Dosage Given: 2.67 Gy
Session Number: 14

## 2021-10-28 ENCOUNTER — Ambulatory Visit
Admission: RE | Admit: 2021-10-28 | Discharge: 2021-10-28 | Disposition: A | Discharge status: Home / Self Care | Payer: Medicare Other | Source: Ambulatory Visit | Attending: Radiation Oncology | Admitting: Radiation Oncology

## 2021-10-28 ENCOUNTER — Other Ambulatory Visit: Payer: Self-pay

## 2021-10-28 DIAGNOSIS — C50412 Malignant neoplasm of upper-outer quadrant of left female breast: Secondary | ICD-10-CM | POA: Diagnosis not present

## 2021-10-28 DIAGNOSIS — M545 Low back pain, unspecified: Secondary | ICD-10-CM | POA: Diagnosis not present

## 2021-10-28 DIAGNOSIS — Z510 Encounter for antineoplastic radiation therapy: Secondary | ICD-10-CM | POA: Diagnosis not present

## 2021-10-28 DIAGNOSIS — R531 Weakness: Secondary | ICD-10-CM | POA: Diagnosis not present

## 2021-10-28 DIAGNOSIS — Z170 Estrogen receptor positive status [ER+]: Secondary | ICD-10-CM | POA: Diagnosis not present

## 2021-10-28 LAB — RAD ONC ARIA SESSION SUMMARY
Course Elapsed Days: 18
Plan Fractions Treated to Date: 15
Plan Prescribed Dose Per Fraction: 2.67 Gy
Plan Total Fractions Prescribed: 15
Plan Total Prescribed Dose: 40.05 Gy
Reference Point Dosage Given to Date: 40.05 Gy
Reference Point Session Dosage Given: 2.67 Gy
Session Number: 15

## 2021-10-31 ENCOUNTER — Ambulatory Visit
Admission: RE | Admit: 2021-10-31 | Discharge: 2021-10-31 | Disposition: A | Discharge status: Home / Self Care | Payer: Medicare Other | Source: Ambulatory Visit | Attending: Radiation Oncology | Admitting: Radiation Oncology

## 2021-10-31 ENCOUNTER — Other Ambulatory Visit: Payer: Self-pay

## 2021-10-31 DIAGNOSIS — M545 Low back pain, unspecified: Secondary | ICD-10-CM | POA: Diagnosis not present

## 2021-10-31 DIAGNOSIS — C50412 Malignant neoplasm of upper-outer quadrant of left female breast: Secondary | ICD-10-CM | POA: Diagnosis not present

## 2021-10-31 DIAGNOSIS — Z170 Estrogen receptor positive status [ER+]: Secondary | ICD-10-CM | POA: Diagnosis not present

## 2021-10-31 DIAGNOSIS — Z510 Encounter for antineoplastic radiation therapy: Secondary | ICD-10-CM | POA: Diagnosis not present

## 2021-10-31 DIAGNOSIS — R531 Weakness: Secondary | ICD-10-CM | POA: Diagnosis not present

## 2021-10-31 LAB — RAD ONC ARIA SESSION SUMMARY
Course Elapsed Days: 21
Plan Fractions Treated to Date: 1
Plan Prescribed Dose Per Fraction: 2 Gy
Plan Total Fractions Prescribed: 5
Plan Total Prescribed Dose: 10 Gy
Reference Point Dosage Given to Date: 42.05 Gy
Reference Point Session Dosage Given: 2 Gy
Session Number: 16

## 2021-11-01 ENCOUNTER — Ambulatory Visit
Admission: RE | Admit: 2021-11-01 | Discharge: 2021-11-01 | Disposition: A | Discharge status: Home / Self Care | Payer: Medicare Other | Source: Ambulatory Visit | Attending: Radiation Oncology | Admitting: Radiation Oncology

## 2021-11-01 ENCOUNTER — Other Ambulatory Visit: Payer: Self-pay

## 2021-11-01 DIAGNOSIS — M545 Low back pain, unspecified: Secondary | ICD-10-CM | POA: Diagnosis not present

## 2021-11-01 DIAGNOSIS — R531 Weakness: Secondary | ICD-10-CM | POA: Diagnosis not present

## 2021-11-01 DIAGNOSIS — Z510 Encounter for antineoplastic radiation therapy: Secondary | ICD-10-CM | POA: Diagnosis not present

## 2021-11-01 DIAGNOSIS — Z170 Estrogen receptor positive status [ER+]: Secondary | ICD-10-CM | POA: Diagnosis not present

## 2021-11-01 DIAGNOSIS — C50412 Malignant neoplasm of upper-outer quadrant of left female breast: Secondary | ICD-10-CM | POA: Diagnosis not present

## 2021-11-01 LAB — RAD ONC ARIA SESSION SUMMARY
Course Elapsed Days: 22
Plan Fractions Treated to Date: 2
Plan Prescribed Dose Per Fraction: 2 Gy
Plan Total Fractions Prescribed: 5
Plan Total Prescribed Dose: 10 Gy
Reference Point Dosage Given to Date: 44.05 Gy
Reference Point Session Dosage Given: 2 Gy
Session Number: 17

## 2021-11-02 ENCOUNTER — Ambulatory Visit
Admission: RE | Admit: 2021-11-02 | Discharge: 2021-11-02 | Disposition: A | Discharge status: Home / Self Care | Payer: Medicare Other | Source: Ambulatory Visit | Attending: Radiation Oncology | Admitting: Radiation Oncology

## 2021-11-02 ENCOUNTER — Other Ambulatory Visit: Payer: Self-pay

## 2021-11-02 DIAGNOSIS — Z510 Encounter for antineoplastic radiation therapy: Secondary | ICD-10-CM | POA: Diagnosis not present

## 2021-11-02 DIAGNOSIS — C50412 Malignant neoplasm of upper-outer quadrant of left female breast: Secondary | ICD-10-CM | POA: Diagnosis not present

## 2021-11-02 DIAGNOSIS — M545 Low back pain, unspecified: Secondary | ICD-10-CM | POA: Diagnosis not present

## 2021-11-02 DIAGNOSIS — Z170 Estrogen receptor positive status [ER+]: Secondary | ICD-10-CM | POA: Diagnosis not present

## 2021-11-02 DIAGNOSIS — R531 Weakness: Secondary | ICD-10-CM | POA: Diagnosis not present

## 2021-11-02 LAB — RAD ONC ARIA SESSION SUMMARY
Course Elapsed Days: 23
Plan Fractions Treated to Date: 3
Plan Prescribed Dose Per Fraction: 2 Gy
Plan Total Fractions Prescribed: 5
Plan Total Prescribed Dose: 10 Gy
Reference Point Dosage Given to Date: 46.05 Gy
Reference Point Session Dosage Given: 2 Gy
Session Number: 18

## 2021-11-03 ENCOUNTER — Encounter: Payer: Self-pay | Admitting: *Deleted

## 2021-11-03 ENCOUNTER — Ambulatory Visit
Admission: RE | Admit: 2021-11-03 | Discharge: 2021-11-03 | Disposition: A | Discharge status: Home / Self Care | Payer: Medicare Other | Source: Ambulatory Visit | Attending: Radiation Oncology | Admitting: Radiation Oncology

## 2021-11-03 ENCOUNTER — Other Ambulatory Visit: Payer: Self-pay

## 2021-11-03 DIAGNOSIS — Z170 Estrogen receptor positive status [ER+]: Secondary | ICD-10-CM | POA: Diagnosis not present

## 2021-11-03 DIAGNOSIS — R531 Weakness: Secondary | ICD-10-CM | POA: Diagnosis not present

## 2021-11-03 DIAGNOSIS — C50412 Malignant neoplasm of upper-outer quadrant of left female breast: Secondary | ICD-10-CM | POA: Diagnosis not present

## 2021-11-03 DIAGNOSIS — M545 Low back pain, unspecified: Secondary | ICD-10-CM | POA: Diagnosis not present

## 2021-11-03 DIAGNOSIS — Z510 Encounter for antineoplastic radiation therapy: Secondary | ICD-10-CM | POA: Diagnosis not present

## 2021-11-03 LAB — RAD ONC ARIA SESSION SUMMARY
Course Elapsed Days: 24
Plan Fractions Treated to Date: 4
Plan Prescribed Dose Per Fraction: 2 Gy
Plan Total Fractions Prescribed: 5
Plan Total Prescribed Dose: 10 Gy
Reference Point Dosage Given to Date: 48.05 Gy
Reference Point Session Dosage Given: 2 Gy
Session Number: 19

## 2021-11-04 ENCOUNTER — Ambulatory Visit
Admission: RE | Admit: 2021-11-04 | Discharge: 2021-11-04 | Disposition: A | Discharge status: Home / Self Care | Payer: Medicare Other | Source: Ambulatory Visit | Attending: Radiation Oncology | Admitting: Radiation Oncology

## 2021-11-04 ENCOUNTER — Other Ambulatory Visit: Payer: Self-pay

## 2021-11-04 ENCOUNTER — Encounter: Payer: Self-pay | Admitting: Radiation Oncology

## 2021-11-04 DIAGNOSIS — C50412 Malignant neoplasm of upper-outer quadrant of left female breast: Secondary | ICD-10-CM | POA: Diagnosis not present

## 2021-11-04 DIAGNOSIS — R531 Weakness: Secondary | ICD-10-CM | POA: Diagnosis not present

## 2021-11-04 DIAGNOSIS — Z170 Estrogen receptor positive status [ER+]: Secondary | ICD-10-CM | POA: Diagnosis not present

## 2021-11-04 DIAGNOSIS — Z510 Encounter for antineoplastic radiation therapy: Secondary | ICD-10-CM | POA: Diagnosis not present

## 2021-11-04 DIAGNOSIS — M545 Low back pain, unspecified: Secondary | ICD-10-CM | POA: Diagnosis not present

## 2021-11-04 LAB — RAD ONC ARIA SESSION SUMMARY
Course Elapsed Days: 25
Plan Fractions Treated to Date: 5
Plan Prescribed Dose Per Fraction: 2 Gy
Plan Total Fractions Prescribed: 5
Plan Total Prescribed Dose: 10 Gy
Reference Point Dosage Given to Date: 50.05 Gy
Reference Point Session Dosage Given: 2 Gy
Session Number: 20

## 2021-11-04 MED ORDER — RADIAPLEXRX EX GEL: Freq: Once | CUTANEOUS | Status: AC

## 2021-11-10 ENCOUNTER — Ambulatory Visit: Payer: Self-pay

## 2021-11-10 ENCOUNTER — Ambulatory Visit (INDEPENDENT_AMBULATORY_CARE_PROVIDER_SITE_OTHER): Payer: Medicare Other | Admitting: Physical Medicine and Rehabilitation

## 2021-11-10 ENCOUNTER — Encounter: Payer: Self-pay | Admitting: Physical Medicine and Rehabilitation

## 2021-11-10 VITALS — BP 127/82 | HR 96

## 2021-11-10 DIAGNOSIS — M4316 Spondylolisthesis, lumbar region: Secondary | ICD-10-CM

## 2021-11-10 DIAGNOSIS — M5416 Radiculopathy, lumbar region: Secondary | ICD-10-CM

## 2021-11-10 DIAGNOSIS — M4306 Spondylolysis, lumbar region: Secondary | ICD-10-CM

## 2021-11-10 MED ORDER — METHYLPREDNISOLONE ACETATE 80 MG/ML IJ SUSP
80.0000 mg | Freq: Once | INTRAMUSCULAR | Status: AC
  Administered 2021-11-10: 80 mg

## 2021-11-10 NOTE — Progress Notes (Signed)
Pt state lower back pain that travels down her left leg. Pt state laying down for a long period of time makes the pain worse. Pt state she uses heat and takes pain meds to help ease her pain.  Numeric Pain Rating Scale and Functional Assessment Average Pain 10   In the last MONTH (on 0-10 scale) has pain interfered with the following?  1. General activity like being  able to carry out your everyday physical activities such as walking, climbing stairs, carrying groceries, or moving a chair?  Rating(10)   +Driver, -BT, -Dye Allergies.

## 2021-11-10 NOTE — Patient Instructions (Signed)

## 2021-11-15 ENCOUNTER — Other Ambulatory Visit: Payer: Self-pay | Admitting: Hematology

## 2021-11-16 ENCOUNTER — Other Ambulatory Visit: Payer: PRIVATE HEALTH INSURANCE

## 2021-11-22 ENCOUNTER — Encounter: Payer: Self-pay | Admitting: Hematology

## 2021-11-23 ENCOUNTER — Other Ambulatory Visit: Payer: PRIVATE HEALTH INSURANCE

## 2021-11-23 NOTE — Progress Notes (Signed)
Christy Young - 76 y.o. female MRN [id number]  Date of birth: 01-11-1946  Office Visit Note: Visit Date: 11/10/2021 PCP: Burnis Medin, MD Referred by: Magnus Sinning, MD  Subjective: Chief Complaint  Patient presents with   Lower Back - Pain   Left Leg - Pain   HPI:  Christy Young is a 76 y.o. female who comes in today for planned repeat Left L5-S1  Lumbar Transforaminal epidural steroid injection with fluoroscopic guidance.  The patient has failed conservative care including home exercise, medications, time and activity modification.  This injection will be diagnostic and hopefully therapeutic.  Please see requesting physician notes for further details and justification. Patient received more than 50% pain relief from prior injection.   Referring: Dr. Eunice Blase and Barnet Pall, FNP and Eppie Gibson, MD  ROS Otherwise per HPI.  Assessment & Plan: Visit Diagnoses:    ICD-10-CM   1. Lumbar radiculopathy  M54.16 XR C-ARM NO REPORT    Epidural Steroid injection    methylPREDNISolone acetate (DEPO-MEDROL) injection 80 mg    2. Spondylolisthesis of lumbar region  M43.16 XR C-ARM NO REPORT    Epidural Steroid injection    methylPREDNISolone acetate (DEPO-MEDROL) injection 80 mg    3. Pars defect of lumbar spine  M43.06 XR C-ARM NO REPORT    Epidural Steroid injection    methylPREDNISolone acetate (DEPO-MEDROL) injection 80 mg      Plan: No additional findings.   Meds & Orders:  Meds ordered this encounter  Medications   methylPREDNISolone acetate (DEPO-MEDROL) injection 80 mg    Orders Placed This Encounter  Procedures   XR C-ARM NO REPORT   Epidural Steroid injection    Follow-up: Return if symptoms worsen or fail to improve.   Procedures: No procedures performed  Lumbosacral Transforaminal Epidural Steroid Injection - Sub-Pedicular Approach with Fluoroscopic Guidance  Patient: Christy Young      Date of Birth: 1946/04/06 MRN: [id number] PCP: Burnis Medin, MD      Visit Date: 11/10/2021   Universal Protocol:    Date/Time: 11/10/2021  Consent Given By: the patient  Position: PRONE  Additional Comments: Vital signs were monitored before and after the procedure. Patient was prepped and draped in the usual sterile fashion. The correct patient, procedure, and site was verified.   Injection Procedure Details:   Procedure diagnoses: Lumbar radiculopathy [M54.16]    Meds Administered:  Meds ordered this encounter  Medications   methylPREDNISolone acetate (DEPO-MEDROL) injection 80 mg    Laterality: Left  Location/Site: L5  Needle:5.0 in., 22 ga.  Short bevel or Quincke spinal needle  Needle Placement: Transforaminal  Findings:    -Comments: Excellent flow of contrast along the nerve, nerve root and into the epidural space.  Procedure Details: After squaring off the end-plates to get a true AP view, the C-arm was positioned so that an oblique view of the foramen as noted above was visualized. The target area is just inferior to the "nose of the scotty dog" or sub pedicular. The soft tissues overlying this structure were infiltrated with 2-3 ml. of 1% Lidocaine without Epinephrine.  The spinal needle was inserted toward the target using a "trajectory" view along the fluoroscope beam.  Under AP and lateral visualization, the needle was advanced so it did not puncture dura and was located close the 6 O'Clock position of the pedical in AP tracterory. Biplanar projections were used to confirm position. Aspiration was confirmed to be negative for CSF and/or  blood. A 1-2 ml. volume of Isovue-250 was injected and flow of contrast was noted at each level. Radiographs were obtained for documentation purposes.   After attaining the desired flow of contrast documented above, a 0.5 to 1.0 ml test dose of 0.25% Marcaine was injected into each respective transforaminal space.  The patient was observed for 90 seconds post injection.  After  no sensory deficits were reported, and normal lower extremity motor function was noted,   the above injectate was administered so that equal amounts of the injectate were placed at each foramen (level) into the transforaminal epidural space.   Additional Comments:  The patient tolerated the procedure well Dressing: 2 x 2 sterile gauze and Band-Aid    Post-procedure details: Patient was observed during the procedure. Post-procedure instructions were reviewed.  Patient left the clinic in stable condition.     Clinical History: MRI LUMBAR SPINE WITHOUT AND WITH CONTRAST   TECHNIQUE: Multiplanar and multiecho pulse sequences of the lumbar spine were obtained without and with intravenous contrast.   CONTRAST:  38m GADAVIST GADOBUTROL 1 MMOL/ML IV SOLN   COMPARISON:  07/05/2020   FINDINGS: Segmentation:  5 lumbar type vertebrae   Alignment:  L5-S1 grade 1/2 anterolisthesis.  Mild scoliosis.   Vertebrae: Chronic L5 pars defects. No evidence of fracture, discitis, or bone lesion. Sacroiliac osteoarthritis.   Conus medullaris and cauda equina: Conus extends to the L1 level. Conus and cauda equina appear normal. 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   Paraspinal and other soft tissues: Negative for perispinal mass or inflammation.   Disc levels:   T12- L1: Unremarkable.   L1-L2: Mild disc bulging.  Mild facet spurring   L2-L3: Disc narrowing and bulging with mild facet spurring. The canal and foramina are patent   L3-L4: Degenerative facet spurring, bulky on the right. Mild disc bulging.   L4-L5: Degenerative disc collapse with endplate and facet spurring bilaterally. The canal and foramina are patent   L5-S1:L5 chronic pars defects with anterolisthesis, disc collapse, disc bulging. Degenerative facet spurring. Biforaminal L5 compression.   IMPRESSION: 1. 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 2. Noncompressive  spinal degeneration elsewhere.  Mild scoliosis. 3. Negative for metastatic disease.     Electronically Signed   By: JJorje GuildM.D.   On: 10/24/2021 07:27     Objective:  VS:  HT:    WT:   BMI:     BP:127/82  HR:96bpm  TEMP: ( )  RESP:  Physical Exam Vitals and nursing note reviewed.  Constitutional:      General: She is not in acute distress.    Appearance: Normal appearance. She is not ill-appearing.  HENT:     Head: Normocephalic and atraumatic.     Right Ear: External ear normal.     Left Ear: External ear normal.  Eyes:     Extraocular Movements: Extraocular movements intact.  Cardiovascular:     Rate and Rhythm: Normal rate.     Pulses: Normal pulses.  Pulmonary:     Effort: Pulmonary effort is normal. No respiratory distress.  Abdominal:     General: There is no distension.     Palpations: Abdomen is soft.  Musculoskeletal:        General: Tenderness present.     Cervical back: Neck supple.     Right lower leg: No edema.     Left lower leg: No edema.     Comments: Patient has good distal strength  with no pain over the greater trochanters.  No clonus or focal weakness.  Skin:    Findings: No erythema, lesion or rash.  Neurological:     General: No focal deficit present.     Mental Status: She is alert and oriented to person, place, and time.     Sensory: No sensory deficit.     Motor: No weakness or abnormal muscle tone.     Coordination: Coordination normal.  Psychiatric:        Mood and Affect: Mood normal.        Behavior: Behavior normal.     Imaging: No results found.

## 2021-11-23 NOTE — Procedures (Signed)
Lumbosacral Transforaminal Epidural Steroid Injection - Sub-Pedicular Approach with Fluoroscopic Guidance  Patient: Christy Young      Date of Birth: 01-Apr-1946 MRN: [id number] PCP: Burnis Medin, MD      Visit Date: 11/10/2021   Universal Protocol:    Date/Time: 11/10/2021  Consent Given By: the patient  Position: PRONE  Additional Comments: Vital signs were monitored before and after the procedure. Patient was prepped and draped in the usual sterile fashion. The correct patient, procedure, and site was verified.   Injection Procedure Details:   Procedure diagnoses: Lumbar radiculopathy [M54.16]    Meds Administered:  Meds ordered this encounter  Medications   methylPREDNISolone acetate (DEPO-MEDROL) injection 80 mg    Laterality: Left  Location/Site: L5  Needle:5.0 in., 22 ga.  Short bevel or Quincke spinal needle  Needle Placement: Transforaminal  Findings:    -Comments: Excellent flow of contrast along the nerve, nerve root and into the epidural space.  Procedure Details: After squaring off the end-plates to get a true AP view, the C-arm was positioned so that an oblique view of the foramen as noted above was visualized. The target area is just inferior to the "nose of the scotty dog" or sub pedicular. The soft tissues overlying this structure were infiltrated with 2-3 ml. of 1% Lidocaine without Epinephrine.  The spinal needle was inserted toward the target using a "trajectory" view along the fluoroscope beam.  Under AP and lateral visualization, the needle was advanced so it did not puncture dura and was located close the 6 O'Clock position of the pedical in AP tracterory. Biplanar projections were used to confirm position. Aspiration was confirmed to be negative for CSF and/or blood. A 1-2 ml. volume of Isovue-250 was injected and flow of contrast was noted at each level. Radiographs were obtained for documentation purposes.   After attaining the desired flow of  contrast documented above, a 0.5 to 1.0 ml test dose of 0.25% Marcaine was injected into each respective transforaminal space.  The patient was observed for 90 seconds post injection.  After no sensory deficits were reported, and normal lower extremity motor function was noted,   the above injectate was administered so that equal amounts of the injectate were placed at each foramen (level) into the transforaminal epidural space.   Additional Comments:  The patient tolerated the procedure well Dressing: 2 x 2 sterile gauze and Band-Aid    Post-procedure details: Patient was observed during the procedure. Post-procedure instructions were reviewed.  Patient left the clinic in stable condition.

## 2021-11-28 ENCOUNTER — Other Ambulatory Visit: Payer: PRIVATE HEALTH INSURANCE

## 2021-12-01 ENCOUNTER — Other Ambulatory Visit: Payer: PRIVATE HEALTH INSURANCE

## 2021-12-02 ENCOUNTER — Ambulatory Visit: Payer: PRIVATE HEALTH INSURANCE | Admitting: Cardiology

## 2021-12-02 ENCOUNTER — Encounter: Payer: Self-pay | Admitting: Hematology

## 2021-12-02 NOTE — Progress Notes (Signed)
                                                                                                                                                             Patient Name: Christy Young MRN: [id number] DOB: March 10, 1946 Referring Physician: Shanon Ace (Profile Not Attached) Date of Service: 11/04/2021 [facility], [place]                                                        End Of Treatment Note  Diagnoses: C50.412-Malignant neoplasm of upper-outer quadrant of left female breast  Cancer Staging:  Cancer Staging  Malignant neoplasm of upper-outer quadrant of left breast in female, estrogen receptor positive ([place]) Staging form: Breast, AJCC 8th Edition - Clinical stage from 06/01/2021: Stage IIA (cT2, cN0, cM0, G3, ER+, PR+, HER2-) - Unsigned Stage prefix: Initial diagnosis Histologic grading system: 3 grade system - Pathologic stage from 06/07/2021: Stage IB (pT2, pN0, cM0, G3, ER+, PR+, HER2-, Oncotype DX score: 29) - Signed by Truitt Merle, MD on 08/05/2021 Stage prefix: Initial diagnosis Multigene prognostic tests performed: Oncotype DX Recurrence score range: Greater than or equal to 11 Histologic grading system: 3 grade system  Intent: Curative  Radiation Treatment Dates: 10/10/2021 through 11/04/2021 Site Technique Total Dose (Gy) Dose per Fx (Gy) Completed Fx Beam Energies  Breast, Left: Breast_L 3D 40.05/40.05 2.67 15/15 10X  Breast, Left: Breast_L_Bst 3D 10/10 2 5/5 6X, 10X   Narrative: The patient tolerated radiation therapy relatively well.   Plan: The patient will follow-up with radiation oncology in 64mo and/or prn. -----------------------------------  Eppie Gibson, MD

## 2021-12-03 ENCOUNTER — Other Ambulatory Visit: Payer: Self-pay | Admitting: Internal Medicine

## 2021-12-08 ENCOUNTER — Ambulatory Visit: Payer: Medicare Other

## 2021-12-08 DIAGNOSIS — R0609 Other forms of dyspnea: Secondary | ICD-10-CM

## 2021-12-15 NOTE — Progress Notes (Incomplete)
Christy Young presents today for follow-up after completing radiation to her left breast on 11/04/2021  Pain: Skin:  ROM:  Lymphedema:  MedOnc F/U: [facility] clinic with Annabelle Harman on 01/24/2022 Other issues of note:   Pt reports Yes No Comments  Tamoxifen '[x]'$  '[]'$    Letrozole '[]'$  '[x]'$    Anastrazole '[]'$  '[x]'$    Mammogram '[x]'$  Date: TBD '[]'$ 

## 2021-12-16 ENCOUNTER — Ambulatory Visit: Payer: Medicare Other | Admitting: Cardiology

## 2021-12-16 ENCOUNTER — Ambulatory Visit
Admission: RE | Admit: 2021-12-16 | Discharge: 2021-12-16 | Disposition: A | Discharge status: Home / Self Care | Payer: Medicare Other | Source: Ambulatory Visit | Attending: Radiation Oncology | Admitting: Radiation Oncology

## 2021-12-16 ENCOUNTER — Encounter: Payer: Self-pay | Admitting: Cardiology

## 2021-12-16 ENCOUNTER — Encounter: Payer: Self-pay | Admitting: Radiation Oncology

## 2021-12-16 ENCOUNTER — Other Ambulatory Visit: Payer: Self-pay

## 2021-12-16 VITALS — BP 137/77 | HR 71 | Temp 98.7°F | Resp 18 | Wt 208.2 lb

## 2021-12-16 VITALS — BP 136/84 | HR 92 | Temp 98.0°F | Resp 17 | Ht 64.0 in | Wt 208.4 lb

## 2021-12-16 DIAGNOSIS — I10 Essential (primary) hypertension: Secondary | ICD-10-CM

## 2021-12-16 DIAGNOSIS — C50412 Malignant neoplasm of upper-outer quadrant of left female breast: Secondary | ICD-10-CM | POA: Insufficient documentation

## 2021-12-16 DIAGNOSIS — E782 Mixed hyperlipidemia: Secondary | ICD-10-CM | POA: Diagnosis not present

## 2021-12-16 DIAGNOSIS — R0609 Other forms of dyspnea: Secondary | ICD-10-CM | POA: Diagnosis not present

## 2021-12-16 DIAGNOSIS — Z170 Estrogen receptor positive status [ER+]: Secondary | ICD-10-CM | POA: Insufficient documentation

## 2021-12-16 MED ORDER — RADIAPLEXRX EX GEL: Freq: Once | CUTANEOUS | Status: AC

## 2021-12-26 ENCOUNTER — Ambulatory Visit: Payer: Medicare Other | Attending: Surgery

## 2021-12-26 VITALS — Wt 209.1 lb

## 2021-12-26 DIAGNOSIS — Z483 Aftercare following surgery for neoplasm: Secondary | ICD-10-CM | POA: Insufficient documentation

## 2021-12-26 NOTE — Therapy (Signed)
OUTPATIENT PHYSICAL THERAPY SOZO SCREENING NOTE   Patient Name: Christy Young MRN: [id number] DOB:03/05/1946, 76 y.o., female Today's Date: 12/26/2021  PCP: [name], Wanda K, MD REFERRING PROVIDER: Tsuei, Matthew, MD   PT End of Session - 12/26/21 1519     Visit Number 2   # unchanged due to screen only   PT Start Time 1516    PT Stop Time 1522    PT Time Calculation (min) 6 min    Activity Tolerance Patient tolerated treatment well    Behavior During Therapy WFL for tasks assessed/performed             Past Medical History:  Diagnosis Date   ARTHRITIS    Breast cancer (HCC)    Carpal tunnel syndrome 08/23/2015   Left   Exertional dyspnea    Fatigue    Gout, unspecified    Hyperlipidemia    Hypertension    meds since age 50    Hyperuricemia    Nonspecific abnormal electrocardiogram (ECG) (EKG)    t wave non acute     OBESITY    Palpitations    hospitalized 2005 felt from stress neg cards eval   Primary hyperparathyroidism (HCC)    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   Past Surgical History:  Procedure Laterality Date   ABDOMINAL HYSTERECTOMY     BREAST BIOPSY     right side   BREAST LUMPECTOMY WITH RADIOACTIVE SEED AND SENTINEL LYMPH NODE BIOPSY Left 06/07/2021   Procedure: LEFT BREAST LUMPECTOMY WITH RADIOACTIVE SEED X2 AND AXILLARY SENTINEL LYMPH NODE BIOPSY;  Surgeon: Tsuei, Matthew, MD;  Location: [facility];  Service: General;  Laterality: Left;   COLONOSCOPY     FACIAL COSMETIC SURGERY  1980   Patient Active Problem List   Diagnosis Date Noted   Malignant neoplasm of upper-outer quadrant of left breast in female, estrogen receptor positive (HCC) 05/30/2021   Pain of left lower extremity 03/16/2021   Exertional dyspnea 03/08/2021   Decreased exercise tolerance 03/08/2021   Hyperparathyroidism (HCC) 02/09/2018   Abnormal prominence of clavicle 02/07/2018   Psoriasis 10/23/2017   Carpal tunnel syndrome 08/23/2015   Ulnar  neuropathy at elbow of left upper extremity 08/23/2015   H/O: gout 08/08/2015   Elevated blood sugar 08/08/2015   Essential hypertension 07/09/2014   Mixed hyperlipidemia 07/09/2014   Body aches 07/09/2014   Urinary frequency 07/09/2014   Numbness and tingling in left hand 07/09/2014   Visit for preventive health examination 04/11/2013   Obesity (BMI 30-39.9) 04/11/2013   Statin intolerance 04/11/2013   At risk for coronary artery disease 05/15/2012   Family history of premature coronary heart disease 04/23/2012   Fatigue 12/29/2010   Nonspecific abnormal electrocardiogram (ECG) (EKG) 12/29/2010   Hyperuricemia 12/29/2010   Palpitations    OBESITY 01/10/2010   HYPERLIPIDEMIA 11/09/2009   Gout 11/09/2009   HYPERTENSION 11/09/2009   ARTHRITIS 11/09/2009    REFERRING DIAG: left breast cancer at risk for lymphedema  THERAPY DIAG:  Aftercare following surgery for neoplasm  PERTINENT HISTORY: Patient underwent a left lumpectomy and sentinel node biopsy (1 negative node) on 06/07/2021. It is ER/PR positive and HER2 negative with a Ki67 of 40%.   PRECAUTIONS: left UE Lymphedema risk, None  SUBJECTIVE: Pt returns for her 3 month L-Dex screen.   PAIN:  Are you having pain? No  SOZO SCREENING: Patient was assessed today using the SOZO machine to determine the lymphedema index score. This was compared to her baseline   score. It was determined that she is NOT within the recommended range when compared to her baseline and so she was fitted for a compression garment while in the clinic today. It is recommended she return in 1 month to be reassessed. If she continues to measure outside the recommended range, physical therapy treatment will be recommended at that time and a referral requested.    Rosenberger, Valerie Ann, PTA 12/26/2021, 3:23 PM       

## 2022-01-16 ENCOUNTER — Other Ambulatory Visit: Payer: Self-pay

## 2022-01-20 ENCOUNTER — Telehealth: Payer: Self-pay | Admitting: Adult Health

## 2022-01-20 NOTE — Telephone Encounter (Signed)
Scheduled per 7/27 in basket, message has been left with pt

## 2022-01-24 ENCOUNTER — Inpatient Hospital Stay: Payer: Medicare Other | Admitting: Adult Health

## 2022-01-27 ENCOUNTER — Encounter: Payer: Self-pay | Admitting: *Deleted

## 2022-02-02 ENCOUNTER — Inpatient Hospital Stay: Payer: BLUE CROSS/BLUE SHIELD | Admitting: Adult Health

## 2022-02-15 ENCOUNTER — Encounter: Payer: Self-pay | Admitting: Internal Medicine

## 2022-02-15 ENCOUNTER — Ambulatory Visit (INDEPENDENT_AMBULATORY_CARE_PROVIDER_SITE_OTHER): Payer: Medicare Other | Admitting: Internal Medicine

## 2022-02-15 VITALS — BP 122/78 | HR 74 | Ht 64.0 in | Wt 204.4 lb

## 2022-02-15 DIAGNOSIS — E213 Hyperparathyroidism, unspecified: Secondary | ICD-10-CM

## 2022-02-15 LAB — BASIC METABOLIC PANEL
BUN: 19 mg/dL (ref 6–23)
CO2: 27 mEq/L (ref 19–32)
Calcium: 11.6 mg/dL — ABNORMAL HIGH (ref 8.4–10.5)
Chloride: 107 mEq/L (ref 96–112)
Creatinine, Ser: 0.82 mg/dL (ref 0.40–1.20)
GFR: 69.53 mL/min (ref >60.00)
Glucose, Bld: 99 mg/dL (ref 70–99)
Potassium: 4.3 mEq/L (ref 3.5–5.1)
Sodium: 141 mEq/L (ref 135–145)

## 2022-02-15 LAB — VITAMIN D 25 HYDROXY (VIT D DEFICIENCY, FRACTURES): VITD: 37.93 ng/mL (ref 30.00–100.00)

## 2022-02-15 LAB — ALBUMIN: Albumin: 4.1 g/dL (ref 3.5–5.2)

## 2022-02-15 NOTE — Progress Notes (Unsigned)
Name: Christy Young  MRN/ DOB: [id number], [birth year]    Age/ Sex: 76 y.o., female     PCP: Burnis Medin, MD   Reason for Endocrinology Evaluation: Hyperparathyroidism     Initial Endocrinology Clinic Visit: 02/07/2018    PATIENT IDENTIFIER: Christy Young is a 76 y.o., female with a past medical history of Hx of breast CA. She has followed with [facility] since 02/07/2018 for consultative assistance with management of her hyperparathyroidism.   HISTORICAL SUMMARY: The patient was first diagnosed with high parathyroidism since 2018.  With a serum calcium max level of 11.4 MG/DL in 2023, and a max level of PTH at 196 PG/mL   DXA normal in December 2021 She had followed up with Dr. Loanne Drilling from 2019 until 2023  SUBJECTIVE:    Today (02/15/2022):  Christy Young is here for follow-up on hyperparathyroidism  She has no renal stones  Has left arm lymphedema  Has frequency but no polydipsia  Statin treatment caused joint aches/pains  Denies constipation    She is on MVI  Vitamin D 5000 iu every other day    HISTORY:  Past Medical History:  Past Medical History:  Diagnosis Date   ARTHRITIS    Breast cancer ([place])    Carpal tunnel syndrome 08/23/2015   Left   Exertional dyspnea    Fatigue    Gout, unspecified    Hyperlipidemia    Hypertension    meds since age 13    Hyperuricemia    Nonspecific abnormal electrocardiogram (ECG) (EKG)    t wave non acute     OBESITY    Palpitations    hospitalized 2005 felt from stress neg cards eval   Primary hyperparathyroidism ([place])    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 She has never used smokeless tobacco. She reports current alcohol use of about 3.0 standard drinks of alcohol per week. She reports that she does not use drugs. Family History:  Family History  Problem Relation Age of Onset   Hypertension Mother    Arthritis Mother    Alcohol abuse Father        deceased   Diabetes Father    Lymphoma Sister        non hodgkins   Heart attack Brother    Lung cancer Brother    Lung cancer Brother    Prostate cancer Brother        dx after 5   Lung cancer Brother    Heart attack Son 57       1999   Hyperparathyroidism Neg Hx      HOME MEDICATIONS: Allergies as of 02/15/2022       Reactions   Pravastatin Other (See Comments)   Body MS aches and pain s   Rosuvastatin    REACTION: leg cramps.   Penicillins Other (See Comments)   Patient  says she  is not allergic to amoxicillin or augmentin   Has taken these wo problem ? Of SE when she was 19  Not sever         Medication List        Accurate as of February 15, 2022  8:55 AM. If you have any questions, ask your nurse or doctor.          STOP taking these medications    furosemide 20 MG tablet Commonly known as: LASIX Stopped by: Dorita Sciara, MD       TAKE these medications    allopurinol 100 MG tablet Commonly known as: ZYLOPRIM Take 2 tablets (200 mg total) by mouth daily. SCHEDULE APPT FOR FUTURE REFILLS   atorvastatin 40 MG tablet Commonly known as: LIPITOR Take 1 tablet (40 mg total) by mouth daily.   HYDROcodone-acetaminophen 5-325 MG tablet Commonly known as: NORCO/VICODIN Take 1 tablet by mouth every 6 (six) hours as needed for moderate pain.   multivitamin with minerals tablet Take 1 tablet by mouth daily.   nebivolol 5 MG tablet Commonly known as:  BYSTOLIC Take 1 tablet (5 mg total) by mouth daily.   tamoxifen 20 MG tablet Commonly known as: NOLVADEX TAKE 1 TABLET BY MOUTH EVERY DAY   Vitamin D3 125 MCG (5000 UT) Caps Take 1 capsule (5,000 Units total) by mouth daily.          OBJECTIVE:   PHYSICAL EXAM: VS: BP 122/78 (BP Location: Left Arm, Patient Position: Sitting, Cuff Size: Large)   Pulse 74   Ht '5\' 4"'$  (1.626 m)   Wt 204 lb 6.4 oz (92.7 kg)   SpO2 96%   BMI 35.09 kg/m    EXAM: General: Pt appears well and is in NAD  Hydration: Well-hydrated with moist mucous membranes and good skin turgor  Eyes: External eye exam normal without stare, lid lag or exophthalmos.  EOM intact.  PERRL.  Ears, Nose, Throat: Hearing: Grossly intact bilaterally Dental: Good dentition  Throat: Clear without mass, erythema or exudate  Neck: General: Supple without adenopathy. Thyroid: Thyroid size normal.  No goiter or nodules appreciated. No thyroid bruit.  Lungs: Clear with good BS bilat with no rales, rhonchi, or wheezes  Heart: Auscultation: RRR.  Abdomen: Normoactive bowel sounds, soft, nontender, without masses or organomegaly palpable  Extremities: Gait and station: Normal gait  Digits and nails: No clubbing, cyanosis, petechiae, or nodes Head and neck: Normal alignment and mobility BL UE: Normal ROM and strength. BL LE: No pretibial edema normal ROM and strength.  Skin: Hair: Texture and amount normal with gender appropriate distribution Skin Inspection: No rashes, acanthosis nigricans/skin tags. No lipohypertrophy Skin Palpation: Skin temperature, texture, and thickness normal to palpation  Neuro: Cranial nerves: II - XII grossly intact  Cerebellar: Normal coordination and movement; no tremor Motor: Normal strength throughout DTRs: 2+ and symmetric in UE without delay in relaxation phase  Mental Status: Judgment, insight: Intact Orientation: Oriented to time, place, and person Memory: Intact for recent and remote  events Mood and affect: No depression, anxiety, or agitation     DATA REVIEWED: ***    ASSESSMENT / PLAN / RECOMMENDATIONS:   ***  Plan: ***    Medications   ***   Signed electronically by: Mack Guise, MD  [facility]  [facility] [address] [place], [place] [address] Phone: [phone] FAX: [phone]      CC: Burnis Medin, MD [place]  [place] [address] Phone: [phone]  Fax: [phone]   Return to Endocrinology clinic as below: Future Appointments  Date Time Provider [place]  02/22/2022 11:50 AM Collie Siad A, PTA [facility] None  03/03/2022  1:15 PM Gardenia Phlegm, NP [facility] None  04/26/2022  2:00 PM [facility] None  04/26/2022  2:40 PM Truitt Merle, MD [facility] None  06/22/2022 11:00 AM Patwardhan, Reynold Bowen, MD [facility] None

## 2022-02-15 NOTE — Patient Instructions (Addendum)
Stay hydrated  Avoid over the counter calcium tablets  Maintain 2-3 servings of calcium through the diet daily    24-Hour Urine Collection  You will be collecting your urine for a 24-hour period of time. Your timer starts with your first urine of the morning (For example - If you first pee at [place], your timer will start at [place]) [place] your first urine of the morning Collect your urine every time you pee for the next 24 hours STOP your urine collection 24 hours after you started the collection (For example - You would stop at 9AM the day after you started)

## 2022-02-16 ENCOUNTER — Other Ambulatory Visit: Payer: Self-pay

## 2022-02-17 LAB — PARATHYROID HORMONE, INTACT (NO CA): PTH: 115 pg/mL — ABNORMAL HIGH (ref 16–77)

## 2022-02-22 ENCOUNTER — Ambulatory Visit: Payer: Medicare Other | Attending: Surgery

## 2022-02-22 VITALS — Wt 207.0 lb

## 2022-02-22 DIAGNOSIS — Z483 Aftercare following surgery for neoplasm: Secondary | ICD-10-CM | POA: Insufficient documentation

## 2022-02-22 NOTE — Therapy (Signed)
OUTPATIENT PHYSICAL THERAPY SOZO SCREENING NOTE   Patient Name: Christy Young MRN: [id number] DOB:Jan 14, 1946, 76 y.o., female Today's Date: 02/22/2022  PCP: Burnis Medin, MD REFERRING PROVIDER: Donnie Mesa, MD   PT End of Session - 02/22/22 1200     Visit Number 2   # unchanged due to screen only   PT Start Time 3428    PT Stop Time 1203    PT Time Calculation (min) 7 min    Activity Tolerance Patient tolerated treatment well    Behavior During Therapy [facility] for tasks assessed/performed             Past Medical History:  Diagnosis Date   ARTHRITIS    Breast cancer ([place])    Carpal tunnel syndrome 08/23/2015   Left   Exertional dyspnea    Fatigue    Gout, unspecified    Hyperlipidemia    Hypertension    meds since age 80    Hyperuricemia    Nonspecific abnormal electrocardiogram (ECG) (EKG)    t wave non acute     OBESITY    Palpitations    hospitalized 2005 felt from stress neg cards eval   Primary hyperparathyroidism ([place])    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   Past Surgical History:  Procedure Laterality Date   ABDOMINAL HYSTERECTOMY     BREAST BIOPSY     right side   BREAST LUMPECTOMY WITH RADIOACTIVE SEED AND SENTINEL LYMPH NODE BIOPSY Left 06/07/2021   Procedure: LEFT BREAST LUMPECTOMY WITH RADIOACTIVE SEED X2 AND AXILLARY SENTINEL LYMPH NODE BIOPSY;  Surgeon: Donnie Mesa, MD;  Location: [place] [facility];  Service: General;  Laterality: Left;   [place]   Patient Active Problem List   Diagnosis Date Noted   Malignant neoplasm of upper-outer quadrant of left breast in female, estrogen receptor positive ([place]) 05/30/2021   Pain of left lower extremity 03/16/2021   Exertional dyspnea 03/08/2021   Decreased exercise tolerance 03/08/2021   Hyperparathyroidism ([place]) 02/09/2018   Abnormal prominence of clavicle 02/07/2018   Psoriasis 10/23/2017   Carpal tunnel syndrome 08/23/2015   Ulnar  neuropathy at elbow of left upper extremity 08/23/2015   H/O: gout 08/08/2015   Elevated blood sugar 08/08/2015   Essential hypertension 07/09/2014   Mixed hyperlipidemia 07/09/2014   Body aches 07/09/2014   Urinary frequency 07/09/2014   Numbness and tingling in left hand 07/09/2014   Visit for preventive health examination 04/11/2013   Obesity (BMI 30-39.9) 04/11/2013   Statin intolerance 04/11/2013   At risk for coronary artery disease 05/15/2012   Family history of premature coronary heart disease 04/23/2012   Fatigue 12/29/2010   Nonspecific abnormal electrocardiogram (ECG) (EKG) 12/29/2010   Hyperuricemia 12/29/2010   Palpitations    OBESITY 01/10/2010   HYPERLIPIDEMIA 11/09/2009   Gout 11/09/2009   HYPERTENSION 11/09/2009   ARTHRITIS 11/09/2009    REFERRING DIAG: left breast cancer at risk for lymphedema  THERAPY DIAG: Aftercare following surgery for neoplasm  PERTINENT HISTORY: Patient underwent a left lumpectomy and sentinel node biopsy (1 negative node) on 06/07/2021. It is ER/PR positive and HER2 negative with a Ki67 of 40%.   PRECAUTIONS: left UE Lymphedema risk, None  SUBJECTIVE: Pt returns for her 1 month L-Dex screen after having a high (>6.5) change from baseline.Marland Kitchen   PAIN:  Are you having pain? No  SOZO SCREENING: Patient was assessed today using the SOZO machine to determine the lymphedema index  score. This was compared to her baseline score. It was determined that she is back within the recommended range when compared to her baseline and no further action is needed at this time, though did suggest she cont to wewar her compression sleeve when increasing her HR with activities, I.e. walking. Also when she flies to [facility] for upcoming trip. She will continue SOZO screenings. These are done every 3 months for 2 years post operatively followed by every 6 months for 2 years, and then annually.   L-DEX FLOWSHEETS - 02/22/22 1200       L-DEX LYMPHEDEMA  SCREENING   Measurement Type Unilateral    L-DEX MEASUREMENT EXTREMITY Upper Extremity    POSITION  Standing    DOMINANT SIDE Right    At Risk Side Left    BASELINE SCORE (UNILATERAL) 2.9    L-DEX SCORE (UNILATERAL) 3.1    VALUE CHANGE (UNILAT) 0.2               Otelia Limes, PTA 02/22/2022, 12:04 PM

## 2022-02-28 ENCOUNTER — Other Ambulatory Visit: Payer: BLUE CROSS/BLUE SHIELD

## 2022-02-28 DIAGNOSIS — C50412 Malignant neoplasm of upper-outer quadrant of left female breast: Secondary | ICD-10-CM | POA: Diagnosis not present

## 2022-02-28 DIAGNOSIS — Z170 Estrogen receptor positive status [ER+]: Secondary | ICD-10-CM | POA: Diagnosis not present

## 2022-03-02 ENCOUNTER — Encounter: Payer: Self-pay | Admitting: Hematology

## 2022-03-02 ENCOUNTER — Other Ambulatory Visit: Payer: Medicare Other

## 2022-03-02 DIAGNOSIS — E213 Hyperparathyroidism, unspecified: Secondary | ICD-10-CM

## 2022-03-02 NOTE — Progress Notes (Unsigned)
Total volume 1,400.  Started 03-01-2022 8am, ended 03-02-22 at 8am.

## 2022-03-03 ENCOUNTER — Other Ambulatory Visit: Payer: Self-pay

## 2022-03-03 ENCOUNTER — Inpatient Hospital Stay: Payer: Medicare Other | Attending: Adult Health | Admitting: Adult Health

## 2022-03-03 ENCOUNTER — Encounter: Payer: Self-pay | Admitting: Adult Health

## 2022-03-03 VITALS — BP 126/78 | HR 84 | Temp 98.2°F | Resp 16 | Ht 64.0 in | Wt 205.9 lb

## 2022-03-03 DIAGNOSIS — Z79810 Long term (current) use of selective estrogen receptor modulators (SERMs): Secondary | ICD-10-CM | POA: Insufficient documentation

## 2022-03-03 DIAGNOSIS — C50412 Malignant neoplasm of upper-outer quadrant of left female breast: Secondary | ICD-10-CM | POA: Insufficient documentation

## 2022-03-03 DIAGNOSIS — Z170 Estrogen receptor positive status [ER+]: Secondary | ICD-10-CM | POA: Diagnosis not present

## 2022-03-03 LAB — CALCIUM, URINE, 24 HOUR: Calcium, 24H Urine: 225 mg/24 h

## 2022-03-03 LAB — CREATININE, URINE, 24 HOUR: Creatinine, 24H Ur: 1.16 g/(24.h) (ref 0.50–2.15)

## 2022-03-03 NOTE — Progress Notes (Signed)
SURVIVORSHIP  VISIT:   BRIEF ONCOLOGIC HISTORY:  Oncology History Overview Note   Cancer Staging  Malignant neoplasm of upper-outer quadrant of left breast in female, estrogen receptor positive ([place]) Staging form: Breast, AJCC 8th Edition - Clinical stage from 06/01/2021: Stage IIA (cT2, cN0, cM0, G3, ER+, PR+, HER2-) - Unsigned      Malignant neoplasm of upper-outer quadrant of left breast in female, estrogen receptor positive ([place])  05/02/2021 Mammogram   Exam: 3D Mammogram Diagnostic Bilateral IMPRESSION: Palpable 2.4 cm irregular high density mass in the upper-outer left breast.  Exam: Breast Ultrasound - Left IMPRESSION: 1. 2.3 cm irregular mass in left breast at 1 o'clock posterior depth, 13 mc from nipple. 2. 0.9 cm irregular mass in left breast at 1 o'clock posterior depth, 10 cm from nipple 3. Three small 3-4 mm round masses favored to represent small cysts seen at 2:30-3:00 11-12 cm from nipple. 4. No abnormal nodes in left axilla.   05/23/2021 Initial Biopsy   Diagnosis 1. Breast, left, needle core biopsy, breast mass, 2.3 cm @ 1:00 13 CMFN  - INVASIVE DUCTAL CARCINOMA, grade 3  2. Breast, left, needle core biopsy, breast mass, 0.9 cm @ 1:00 10 CMFN  - INVASIVE DUCTAL CARCINOMA, grade 1  1. PROGNOSTIC INDICATORS Results: The tumor cells are EQUIVOCAL for Her2 (2+) Estrogen Receptor: 100%, POSITIVE, STRONG STAINING INTENSITY Progesterone Receptor: 60%, POSITIVE, STRONG STAINING INTENSITY Proliferation Marker Ki67: 40%  1. FLUORESCENCE IN-SITU HYBRIDIZATION Results: GROUP 5: HER2 **NEGATIVE**  2. PROGNOSTIC INDICATORS Results: The tumor cells are NEGATIVE for Her2 (1+) Estrogen Receptor: 100%, POSITIVE, STRONG STAINING INTENSITY Progesterone Receptor: 100%, POSITIVE, STRONG STAINING INTENSITY Proliferation Marker Ki67: 10%   05/30/2021 Initial Diagnosis   Malignant neoplasm of upper-outer quadrant of left breast in female, estrogen receptor positive ([place])    06/07/2021 Definitive Surgery   FINAL MICROSCOPIC DIAGNOSIS:   A. BREAST, LEFT, LUMPECTOMY:  Invasive ductal carcinomas (two independent lesions, 2.5 cm and 0.8 cm) with clear margins of resection.  Please see the synoptic report after specimen B.   B. LYMPH NODE, LEFT AXILLARY #1, SENTINEL, EXCISION:  Hyperplastic lymph node with fatty infiltration which is negative for  metastatic carcinoma.    06/07/2021 Oncotype testing   Oncotype DX was obtained on the final surgical sample and the recurrence score of 28 predicts a risk of recurrence outside the breast over the next 9 years of 18%, if the patient's only systemic therapy is an antiestrogen for 5 years.  It also predicts a significant benefit from chemotherapy.    06/07/2021 Cancer Staging   Staging form: Breast, AJCC 8th Edition - Pathologic stage from 06/07/2021: Stage IB (pT2, pN0, cM0, G3, ER+, PR+, HER2-, Oncotype DX score: 29) - Signed by Truitt Merle, MD on 08/05/2021 Stage prefix: Initial diagnosis Multigene prognostic tests performed: Oncotype DX Recurrence score range: Greater than or equal to 11 Histologic grading system: 3 grade system   07/15/2021 -  Chemotherapy   Patient is on Treatment Plan : BREAST TC q21d     10/10/2021 - 11/04/2021 Radiation Therapy   Site Technique Total Dose (Gy) Dose per Fx (Gy) Completed Fx Beam Energies  Breast, Left: Breast_L 3D 40.05/40.05 2.67 15/15 10X  Breast, Left: Breast_L_Bst 3D 10/10 2 5/5 6X, 10X     11/2021 -  Anti-estrogen oral therapy   Tamoxifen     INTERVAL HISTORY:  [name] to review her survivorship care plan detailing her treatment course for breast cancer, as well as monitoring long-term side effects  of that treatment, education regarding health maintenance, screening, and overall wellness and health promotion.     Overall, [name] reports feeling quite well.  She is taking tamoxifen daily with good tolerance.  REVIEW OF SYSTEMS:  Review of Systems   Constitutional:  Negative for appetite change, chills, fatigue, fever and unexpected weight change.  HENT:   Negative for hearing loss, lump/mass and trouble swallowing.   Eyes:  Negative for eye problems and icterus.  Respiratory:  Negative for chest tightness, cough and shortness of breath.   Cardiovascular:  Negative for chest pain, leg swelling and palpitations.  Gastrointestinal:  Negative for abdominal distention, abdominal pain, constipation, diarrhea, nausea and vomiting.  Endocrine: Negative for hot flashes.  Genitourinary:  Negative for difficulty urinating.   Musculoskeletal:  Negative for arthralgias.  Skin:  Negative for itching and rash.  Neurological:  Negative for dizziness, extremity weakness, headaches and numbness.  Hematological:  Negative for adenopathy. Does not bruise/bleed easily.  Psychiatric/Behavioral:  Negative for depression. The patient is not nervous/anxious.    Breast: Denies any new nodularity, masses, tenderness, nipple changes, or nipple discharge.      ONCOLOGY TREATMENT TEAM:  1. Surgeon:  Dr. Georgette Dover at [facility] 2. Medical Oncologist: Dr. Burr Medico  3. Radiation Oncologist: Dr. Isidore Moos    PAST MEDICAL/SURGICAL HISTORY:  Past Medical History:  Diagnosis Date   ARTHRITIS    Breast cancer (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   Penicillins Other (See Comments)    Patient  says she is not allergic to amoxicillin or augmentin   Has taken these wo problem ? Of SE when she was 19  Not sever      CURRENT MEDICATIONS:  Outpatient Encounter Medications as of 03/03/2022  Medication Sig   tamoxifen (NOLVADEX) 20 MG tablet Take 1 tablet by mouth daily.   allopurinol (ZYLOPRIM) 100 MG tablet Take 2 tablets (200 mg total) by mouth daily. SCHEDULE APPT FOR FUTURE REFILLS   atorvastatin (LIPITOR) 40 MG tablet Take 1 tablet (40 mg total) by mouth daily.   Cholecalciferol (VITAMIN D3) 125 MCG (5000 UT) CAPS Take 1 capsule (5,000 Units total) by mouth daily.   HYDROcodone-acetaminophen (NORCO/VICODIN) 5-325 MG tablet Take 1 tablet by mouth every 6 (six) hours as needed for moderate pain.  Multiple Vitamins-Minerals (MULTIVITAMIN WITH MINERALS) tablet Take 1 tablet by mouth daily.   nebivolol (BYSTOLIC) 5 MG tablet Take 1 tablet (5 mg total) by mouth daily.   [DISCONTINUED] tamoxifen (NOLVADEX) 20 MG tablet TAKE 1 TABLET BY MOUTH EVERY DAY   No facility-administered encounter medications on file as of 03/03/2022.     ONCOLOGIC FAMILY HISTORY:  Family History  Problem Relation Age of Onset   Hypertension Mother    Arthritis Mother    Alcohol abuse Father        deceased   Diabetes Father    Lymphoma Sister        non hodgkins   Heart attack Brother    Lung cancer Brother    Lung cancer Brother    Prostate cancer Brother        dx after 33   Lung cancer Brother    Heart attack Son 24       1999   Hyperparathyroidism Neg Hx     SOCIAL  HISTORY:  Social History   Socioeconomic History   Marital status: Single    Spouse name: Not on file   Number of children: 2   Years of education: Not on file   Highest education level: Not on file  Occupational History   Occupation: retired   Tobacco Use   Smoking status: Never   Smokeless tobacco: Never  Vaping Use   Vaping Use: Never used  Substance and Sexual Activity   Alcohol use: Yes    Alcohol/week: 3.0 standard drinks of alcohol    Types: 3 Glasses of wine per week   Drug use: No   Sexual activity: Not Currently    Birth control/protection: Surgical    Comment: Hysterectomy  Other Topics Concern   Not on file  Social History Narrative   Occupation: retired Event organiser, 3 yrs of college   Bereaved parent   Single   Moved in 01/01/23    No pets   G31P2   Sis died of cancer lymphoma 66  and bro now with lung cancer and spread.died 2023/07/07.   Social Determinants of Health   Financial Resource Strain: Low Risk  (04/27/2021)   Overall Financial Resource Strain (CARDIA)    Difficulty of Paying Living Expenses: Not hard at all  Food Insecurity: No Food Insecurity (04/27/2021)   Hunger Vital Sign    Worried About Running Out of Food in the Last Year: Never true    Ran Out of Food in the Last Year: Never true  Transportation Needs: No Transportation Needs (04/27/2021)   PRAPARE - Hydrologist (Medical): No    Lack of Transportation (Non-Medical): No  Physical Activity: Insufficiently Active (04/27/2021)   Exercise Vital Sign    Days of Exercise per Week: 3 days    Minutes of Exercise per Session: 20 min  Stress: No Stress Concern Present (04/27/2021)   [place]    Feeling of Stress : Not at all  Social Connections: Moderately Integrated (04/27/2021)   Social Connection and Isolation Panel [NHANES]    Frequency of Communication with Friends and Family: More than three times  a week    Frequency of Social Gatherings with Friends and Family: More than three times a week    Attends Religious Services: More than 4 times per year    Active Member of [employer] or Organizations: Yes    Attends Archivist Meetings:  1 to 4 times per year    Marital Status: Never married  Intimate Partner Violence: Not At Risk (04/27/2021)   Humiliation, Afraid, Rape, and Kick questionnaire    Fear of Current or Ex-Partner: No    Emotionally Abused: No    Physically Abused: No    Sexually Abused: No     OBSERVATIONS/OBJECTIVE:  BP 126/78 (BP Location: Left Arm, Patient Position: Sitting)   Pulse 84   Temp 98.2 F (36.8 C) (Temporal)   Resp 16   Ht _0  (1.626 m)   Wt 205 lb 14.4 oz (93.4 kg)   SpO2 95%   BMI 35.34 kg/m  GENERAL: Patient is a well appearing female in no acute distress HEENT:  Sclerae anicteric.  Oropharynx clear and moist. No ulcerations or evidence of oropharyngeal candidiasis. Neck is supple.  NODES:  No cervical, supraclavicular, or axillary lymphadenopathy palpated.  BREAST EXAM:   LUNGS:  Clear to auscultation bilaterally.  No wheezes or rhonchi. HEART:  Regular rate and rhythm. No murmur appreciated. ABDOMEN:  Soft, nontender.  Positive, normoactive bowel sounds. No organomegaly palpated. MSK:  No focal spinal tenderness to palpation. Full range of motion bilaterally in the upper extremities. EXTREMITIES:  No peripheral edema.   SKIN:  Clear with no obvious rashes or skin changes. No nail dyscrasia. NEURO:  Nonfocal. Well oriented.  Appropriate affect.   LABORATORY DATA:  None for this visit.  DIAGNOSTIC IMAGING:  None for this visit.      ASSESSMENT AND PLAN:  Ms.. Christy Young is a pleasant 76 y.o. 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  She  presents to the Survivorship Clinic for our initial meeting and routine follow-up post-completion of treatment for breast cancer.    1. Stage 1B left breast cancer:  [name] is continuing to recover from definitive treatment for breast cancer. She will follow-up with her medical oncologist, Dr. Burr Medico in November 2023 with history and physical exam per surveillance protocol.  She will continue her anti-estrogen therapy with tamoxifen. Thus far, she is tolerating the tamoxifen well, with minimal side effects.  Her mammogram is due November 2023; orders placed today. Today, a comprehensive survivorship care plan and treatment summary was reviewed with the patient today detailing her breast cancer diagnosis, treatment course, potential late/long-term effects of treatment, appropriate follow-up care with recommendations for the future, and patient education resources.  A copy of this summary, along with a letter will be sent to the patient's primary care provider via mail/fax/In Basket message after today's visit.    2. Bone health:   She was given education on specific activities to promote bone health.  3. Cancer screening:  Due to [name]'s history and her age, she should receive screening for skin cancers, colon cancer.  The information and recommendations are listed on the patient's comprehensive care plan/treatment summary and were reviewed in detail with the patient.    4. Health maintenance and wellness promotion: [name] was encouraged to consume 5-7 servings of fruits and vegetables per day. We reviewed the "Nutrition Rainbow" handout.  She was also encouraged to engage in moderate to vigorous exercise for 30 minutes per day most days of the week. We discussed the [employer] fitness program, which is designed for cancer survivors to help them become more physically fit after cancer treatments.  She was instructed to  limit her alcohol consumption and continue to abstain from tobacco use.    5. Support services/counseling: It is not uncommon for this period of the patient's cancer care trajectory to be one of many emotions and stressors.  She was given information regarding our available services and encouraged to contact me with any questions or for help enrolling in any of our support group/programs.    Follow up instructions:    -Return to cancer center 04/2022  -Mammogram due in 04/2022 -Follow up with surgery 10/2021 -She is welcome to return back to the Survivorship Clinic at any time; no additional follow-up needed at this time.  -Consider referral back to survivorship as a long-term survivor for continued surveillance  The patient was provided an opportunity to ask questions and all were answered. The patient agreed with the plan and demonstrated an understanding of the instructions.   Total encounter time:40 minutes*in face-to-face visit time, chart review, lab review, care coordination, order entry, and documentation of the encounter time.    Wilber Bihari, NP 03/03/22 12:50 PM Medical Oncology and Hematology [facility] [place], [place] [address] Tel. [phone]    Fax. [phone]  *Total Encounter Time as defined by the Centers for Medicare and Medicaid Services includes, in addition to the face-to-face time of a patient visit (documented in the note above) non-face-to-face time: obtaining and reviewing outside history, ordering and reviewing medications, tests or procedures, care coordination (communications with other health care professionals or caregivers) and documentation in the medical record.

## 2022-03-08 ENCOUNTER — Other Ambulatory Visit: Payer: Self-pay | Admitting: Internal Medicine

## 2022-03-30 ENCOUNTER — Other Ambulatory Visit: Payer: Self-pay | Admitting: Internal Medicine

## 2022-04-09 NOTE — Progress Notes (Unsigned)
No chief complaint on file.   HPI: Christy Young 76 y.o. come in for Chronic disease  Fu lipids   atorva  ROS: See pertinent positives and negatives per HPI.  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    Family History  Problem Relation Age of Onset   Hypertension Mother    Arthritis Mother    Alcohol abuse Father        deceased   Diabetes Father    Lymphoma Sister        non hodgkins   Heart attack Brother    Lung cancer Brother    Lung cancer Brother    Prostate cancer Brother        dx after 57   Lung cancer Brother    Heart attack Son 39       1999   Hyperparathyroidism Neg Hx     Social History   Socioeconomic History   Marital status: Single    Spouse name: Not on file   Number of children: 2   Years of education: Not on file   Highest education level: Not on file  Occupational History   Occupation: retired   Tobacco Use   Smoking status: Never   Smokeless tobacco: Never  Vaping Use   Vaping Use: Never used  Substance and Sexual Activity   Alcohol use: Yes    Alcohol/week: 3.0 standard drinks of alcohol    Types: 3 Glasses of wine per week   Drug use: No   Sexual activity: Not Currently    Birth control/protection: Surgical    Comment: Hysterectomy  Other Topics Concern   Not on file  Social History Narrative   Occupation: retired Event organiser, 3 yrs of college   Bereaved parent   Single   Moved in 12/31/22    No pets   [id number]   Sis died of cancer lymphoma 35  and bro now with lung  cancer and spread.died 2023-07-01.   Social Determinants of Health   Financial Resource Strain: Low Risk  (04/27/2021)   Overall Financial Resource Strain (CARDIA)    Difficulty of Paying Living Expenses: Not hard at all  Food Insecurity: No Food Insecurity (04/27/2021)   Hunger Vital Sign    Worried About Running Out of Food in the Last Year: Never true    Ran Out of Food in the Last Year: Never true  Transportation Needs: No Transportation Needs (04/27/2021)   PRAPARE - Hydrologist (Medical): No    Lack of Transportation (Non-Medical): No  Physical Activity: Insufficiently Active (04/27/2021)   Exercise Vital Sign    Days of Exercise per Week: 3 days    Minutes of Exercise per Session: 20 min  Stress: No Stress Concern Present (04/27/2021)   [place]    Feeling of Stress :  Not at all  Social Connections: Moderately Integrated (04/27/2021)   Social Connection and Isolation Panel [NHANES]    Frequency of Communication with Friends and Family: More than three times a week    Frequency of Social Gatherings with Friends and Family: More than three times a week    Attends Religious Services: More than 4 times per year    Active Member of [employer] or Organizations: Yes    Attends Archivist Meetings: 1 to 4 times per year    Marital Status: Never married    Outpatient Medications Prior to Visit  Medication Sig Dispense Refill   allopurinol (ZYLOPRIM) 100 MG tablet TAKE 2 TABLETS BY MOUTH EVERY DAY, SCHEDULE APPOINTMENT FOR REFILLS 60 tablet 0   atorvastatin (LIPITOR) 40 MG tablet Take 1 tablet (40 mg total) by mouth daily. 90 tablet 3   Cholecalciferol (VITAMIN D3) 125 MCG (5000 UT) CAPS Take 1 capsule (5,000 Units total) by mouth daily. 100 capsule 3   nebivolol (BYSTOLIC) 5 MG tablet TAKE 1 TABLET DAILY 90 tablet 0   tamoxifen (NOLVADEX) 20 MG tablet Take 1 tablet by mouth daily.     No  facility-administered medications prior to visit.     EXAM:  There were no vitals taken for this visit.  There is no height or weight on file to calculate BMI.  GENERAL: vitals reviewed and listed above, alert, oriented, appears well hydrated and in no acute distress HEENT: atraumatic, conjunctiva  clear, no obvious abnormalities on inspection of external nose and ears OP : no lesion edema or exudate  NECK: no obvious masses on inspection palpation  LUNGS: clear to auscultation bilaterally, no wheezes, rales or rhonchi, good air movement CV: HRRR, no clubbing cyanosis or  peripheral edema nl cap refill  MS: moves all extremities without noticeable focal  abnormality PSYCH: pleasant and cooperative, no obvious depression or anxiety Lab Results  Component Value Date   WBC 3.7 (L) 10/24/2021   HGB 11.4 (L) 10/24/2021   HCT 35.0 (L) 10/24/2021   PLT 298 10/24/2021   GLUCOSE 99 02/15/2022   CHOL 289 (H) 02/16/2021   TRIG 326.0 (H) 02/16/2021   HDL 57.00 02/16/2021   LDLDIRECT 193.0 02/16/2021   LDLCALC 146 (H) 02/18/2020   ALT 13 10/24/2021   AST 13 (L) 10/24/2021   NA 141 02/15/2022   K 4.3 02/15/2022   CL 107 02/15/2022   CREATININE 0.82 02/15/2022   BUN 19 02/15/2022   CO2 27 02/15/2022   TSH 0.34 (L) 02/16/2021   HGBA1C 5.9 02/16/2021   MICROALBUR 1.4 12/01/2009   BP Readings from Last 3 Encounters:  03/03/22 126/78  02/15/22 122/78  12/16/21 137/77    ASSESSMENT AND PLAN:  Discussed the following assessment and plan:  No diagnosis found. Tsh Lipids Anemia  -Patient advised to return or notify health care team  if  new concerns arise.  There are no Patient Instructions on file for this visit.   Standley Brooking. Alvita Fana M.D.

## 2022-04-10 ENCOUNTER — Other Ambulatory Visit: Payer: Self-pay | Admitting: Internal Medicine

## 2022-04-10 ENCOUNTER — Ambulatory Visit (INDEPENDENT_AMBULATORY_CARE_PROVIDER_SITE_OTHER): Payer: Medicare Other | Admitting: Internal Medicine

## 2022-04-10 ENCOUNTER — Encounter: Payer: Self-pay | Admitting: Internal Medicine

## 2022-04-10 VITALS — BP 116/78 | HR 101 | Temp 97.7°F | Wt 201.4 lb

## 2022-04-10 DIAGNOSIS — E785 Hyperlipidemia, unspecified: Secondary | ICD-10-CM | POA: Diagnosis not present

## 2022-04-10 DIAGNOSIS — C50412 Malignant neoplasm of upper-outer quadrant of left female breast: Secondary | ICD-10-CM | POA: Diagnosis not present

## 2022-04-10 DIAGNOSIS — Z23 Encounter for immunization: Secondary | ICD-10-CM

## 2022-04-10 DIAGNOSIS — Z170 Estrogen receptor positive status [ER+]: Secondary | ICD-10-CM

## 2022-04-10 DIAGNOSIS — E790 Hyperuricemia without signs of inflammatory arthritis and tophaceous disease: Secondary | ICD-10-CM

## 2022-04-10 DIAGNOSIS — Z79899 Other long term (current) drug therapy: Secondary | ICD-10-CM | POA: Diagnosis not present

## 2022-04-10 DIAGNOSIS — E210 Primary hyperparathyroidism: Secondary | ICD-10-CM

## 2022-04-10 DIAGNOSIS — R3915 Urgency of urination: Secondary | ICD-10-CM | POA: Diagnosis not present

## 2022-04-10 DIAGNOSIS — Z789 Other specified health status: Secondary | ICD-10-CM | POA: Diagnosis not present

## 2022-04-10 DIAGNOSIS — Z1211 Encounter for screening for malignant neoplasm of colon: Secondary | ICD-10-CM

## 2022-04-10 DIAGNOSIS — R7989 Other specified abnormal findings of blood chemistry: Secondary | ICD-10-CM

## 2022-04-10 LAB — HEPATIC FUNCTION PANEL
ALT: 12 U/L (ref 0–35)
AST: 12 U/L (ref 0–37)
Albumin: 4.3 g/dL (ref 3.5–5.2)
Alkaline Phosphatase: 48 U/L (ref 39–117)
Bilirubin, Direct: 0.1 mg/dL (ref 0.0–0.3)
Total Bilirubin: 0.7 mg/dL (ref 0.2–1.2)
Total Protein: 7 g/dL (ref 6.0–8.3)

## 2022-04-10 LAB — BASIC METABOLIC PANEL
BUN: 15 mg/dL (ref 6–23)
CO2: 24 mEq/L (ref 19–32)
Calcium: 10.9 mg/dL — ABNORMAL HIGH (ref 8.4–10.5)
Chloride: 107 mEq/L (ref 96–112)
Creatinine, Ser: 0.92 mg/dL (ref 0.40–1.20)
GFR: 60.5 mL/min (ref >60.00)
Glucose, Bld: 97 mg/dL (ref 70–99)
Potassium: 4.5 mEq/L (ref 3.5–5.1)
Sodium: 141 mEq/L (ref 135–145)

## 2022-04-10 LAB — POCT URINALYSIS DIPSTICK
Bilirubin, UA: POSITIVE
Blood, UA: NEGATIVE
Glucose, UA: NEGATIVE
Ketones, UA: POSITIVE
Leukocytes, UA: NEGATIVE
Nitrite, UA: NEGATIVE
Protein, UA: POSITIVE — AB
Urobilinogen, UA: 0.2 E.U./dL
pH, UA: 5.5 (ref 5.0–8.0)

## 2022-04-10 LAB — CBC WITH DIFFERENTIAL/PLATELET
Basophils Absolute: 0 10*3/uL (ref 0.0–0.1)
Basophils Relative: 0.2 % (ref 0.0–3.0)
Eosinophils Absolute: 0.2 10*3/uL (ref 0.0–0.7)
Eosinophils Relative: 2.8 % (ref 0.0–5.0)
HCT: 39.7 % (ref 36.0–46.0)
Hemoglobin: 13.3 g/dL (ref 12.0–15.0)
Lymphocytes Relative: 31 % (ref 12.0–46.0)
Lymphs Abs: 1.7 10*3/uL (ref 0.7–4.0)
MCHC: 33.4 g/dL (ref 30.0–36.0)
MCV: 90.6 fl (ref 78.0–100.0)
Monocytes Absolute: 0.4 10*3/uL (ref 0.1–1.0)
Monocytes Relative: 8 % (ref 3.0–12.0)
Neutro Abs: 3.2 10*3/uL (ref 1.4–7.7)
Neutrophils Relative %: 58 % (ref 43.0–77.0)
Platelets: 268 10*3/uL (ref 150.0–400.0)
RBC: 4.39 Mil/uL (ref 3.87–5.11)
RDW: 14.2 % (ref 11.5–15.5)
WBC: 5.6 10*3/uL (ref 4.0–10.5)

## 2022-04-10 LAB — LIPID PANEL
Cholesterol: 228 mg/dL — ABNORMAL HIGH (ref 0–200)
HDL: 45.7 mg/dL (ref >39.00)
LDL Cholesterol: 150 mg/dL — ABNORMAL HIGH (ref 0–99)
NonHDL: 182.41
Total CHOL/HDL Ratio: 5
Triglycerides: 162 mg/dL — ABNORMAL HIGH (ref 0.0–149.0)
VLDL: 32.4 mg/dL (ref 0.0–40.0)

## 2022-04-10 LAB — URIC ACID: Uric Acid, Serum: 10.5 mg/dL — ABNORMAL HIGH (ref 2.4–7.0)

## 2022-04-10 LAB — T4, FREE: Free T4: 0.82 ng/dL (ref 0.60–1.60)

## 2022-04-10 LAB — TSH: TSH: 3.29 u[IU]/mL (ref 0.35–5.50)

## 2022-04-10 NOTE — Patient Instructions (Addendum)
Good to see you today   Lab today   and let you know  and share with your specialist   Understand  why   you stopped the meds    consider options for cholesterol Will do colonoscopy screening referral.

## 2022-04-16 NOTE — Progress Notes (Signed)
Cholesterol up some as expected off medication. But not as  high as  in past  Blood count kidney liver thyroid all in range .  Calcium borderline up. Uric  acid up as expected  Forwarding  info to  your cardiologist for any interventionthey advise

## 2022-04-19 ENCOUNTER — Telehealth: Payer: Self-pay | Admitting: Gastroenterology

## 2022-04-19 NOTE — Telephone Encounter (Signed)
Good Afternoon Dr. Havery Moros,  Supervising MD 10/16 PM  We received a referral on this patient to be seen for a screening colonoscopy. The patient has been seen by [facility] in the past for colonoscopies. Her last one was in 2015. She is requesting a transfer because she was not pleased with the care she received from them and would like all her physicians under [facility].  The records of her procedure are available for review in Epic. Please advise on scheduling. Thank you.

## 2022-04-20 NOTE — Telephone Encounter (Signed)
Patient has been made aware of decision and at this time she would like to wait to make an appointment.

## 2022-04-20 NOTE — Telephone Encounter (Signed)
I'd be happy to see her. In regards to timing of her next colonoscopy, she had an exam in 2015 where polyps were removed but they were not pre-cancerous, so she would not need another exam until 2025. I can see her in the office to discuss this further. thanks

## 2022-04-26 ENCOUNTER — Inpatient Hospital Stay: Payer: Medicare Other | Attending: Adult Health | Admitting: Hematology

## 2022-04-26 ENCOUNTER — Encounter: Payer: Self-pay | Admitting: Hematology

## 2022-04-26 ENCOUNTER — Inpatient Hospital Stay: Payer: Medicare Other

## 2022-04-26 VITALS — BP 133/82 | HR 72 | Temp 97.4°F | Resp 18 | Wt 205.2 lb

## 2022-04-26 DIAGNOSIS — Z170 Estrogen receptor positive status [ER+]: Secondary | ICD-10-CM | POA: Insufficient documentation

## 2022-04-26 DIAGNOSIS — Z79810 Long term (current) use of selective estrogen receptor modulators (SERMs): Secondary | ICD-10-CM | POA: Insufficient documentation

## 2022-04-26 DIAGNOSIS — C50412 Malignant neoplasm of upper-outer quadrant of left female breast: Secondary | ICD-10-CM | POA: Insufficient documentation

## 2022-04-26 LAB — CMP (CANCER CENTER ONLY)
ALT: 13 U/L (ref 0–44)
AST: 14 U/L — ABNORMAL LOW (ref 15–41)
Albumin: 3.9 g/dL (ref 3.5–5.0)
Alkaline Phosphatase: 44 U/L (ref 38–126)
Anion gap: 7 (ref 5–15)
BUN: 17 mg/dL (ref 8–23)
CO2: 24 mmol/L (ref 22–32)
Calcium: 10.4 mg/dL — ABNORMAL HIGH (ref 8.9–10.3)
Chloride: 111 mmol/L (ref 98–111)
Creatinine: 0.83 mg/dL (ref 0.44–1.00)
GFR, Estimated: >60 mL/min (ref >60)
Glucose, Bld: 93 mg/dL (ref 70–99)
Potassium: 3.9 mmol/L (ref 3.5–5.1)
Sodium: 142 mmol/L (ref 135–145)
Total Bilirubin: 0.7 mg/dL (ref 0.3–1.2)
Total Protein: 6.9 g/dL (ref 6.5–8.1)

## 2022-04-26 LAB — CBC WITH DIFFERENTIAL (CANCER CENTER ONLY)
Abs Immature Granulocytes: 0.01 10*3/uL (ref 0.00–0.07)
Basophils Absolute: 0.1 10*3/uL (ref 0.0–0.1)
Basophils Relative: 1 %
Eosinophils Absolute: 0.2 10*3/uL (ref 0.0–0.5)
Eosinophils Relative: 3 %
HCT: 37.8 % (ref 36.0–46.0)
Hemoglobin: 12.8 g/dL (ref 12.0–15.0)
Immature Granulocytes: 0 %
Lymphocytes Relative: 28 %
Lymphs Abs: 1.6 10*3/uL (ref 0.7–4.0)
MCH: 30.8 pg (ref 26.0–34.0)
MCHC: 33.9 g/dL (ref 30.0–36.0)
MCV: 90.9 fL (ref 80.0–100.0)
Monocytes Absolute: 0.5 10*3/uL (ref 0.1–1.0)
Monocytes Relative: 8 %
Neutro Abs: 3.3 10*3/uL (ref 1.7–7.7)
Neutrophils Relative %: 60 %
Platelet Count: 244 10*3/uL (ref 150–400)
RBC: 4.16 MIL/uL (ref 3.87–5.11)
RDW: 13 % (ref 11.5–15.5)
WBC Count: 5.5 10*3/uL (ref 4.0–10.5)
nRBC: 0 % (ref 0.0–0.2)

## 2022-04-26 NOTE — Progress Notes (Signed)
[place]   Telephone:([phone]) [phone] Fax:([phone]) [phone]   Clinic Follow up Note   Patient Care Team: [name], Standley Brooking, MD as PCP - General (Internal Medicine) Valinda Party, MD as Referring Physician (Rheumatology) Donnie Mesa, MD as Consulting Physician (General Surgery) Truitt Merle, MD as Consulting Physician (Hematology) Eppie Gibson, MD as Attending Physician (Radiation Oncology) Magnus Sinning, MD as Consulting Physician (Physical Medicine and Rehabilitation) Nigel Mormon, MD as Consulting Physician (Cardiology) [facility], Melanie Crazier, MD as Attending Physician (Endocrinology)  Date of Service:  04/26/2022  CHIEF COMPLAINT: f/u of left breast cancer  CURRENT THERAPY:  Tamoxifen, starting 11/2021  ASSESSMENT & PLAN:  Christy Young is a 76 y.o. post-hysterectomy female with   1. 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 Left lumpectomy on 06/07/21 by Dr. Georgette Dover showed grade 3 IDC to both tumors (2.5 cm and 0.8 cm) with small foci of DCIS. Margins and lymph node were negative. -Oncotype Dx recurrence score of 29, high risk -s/p 4 cycles adjuvant TC 07/15/21 - 09/15/21. She tolerated fairly with significant side effects. -s/p radiation under Dr. Isidore Moos, 10/10/21 - 11/04/21. -she began tamoxifen in 11/2021. She is tolerating well with no noticeable side effects. -she is clinically doing well. Labs reviewed, overall WNL. Physical exam was unremarkable. There is no clinical concern for recurrence. -continue surveillance. She is due for annual mammogram this month; order is already in place.   2. Bone Health, Hypercalcemia -Her most recent DEXA was 06/08/20 showing -0.8 (normal) -I explained that tamoxifen can increase bone density. -she has a history of elevated calcium, ranging between 10-12.     PLAN:   -continue tamoxifen -lab and f/u in 6 months   No problem-specific Assessment & Plan notes found for this encounter.   SUMMARY OF ONCOLOGIC HISTORY: Oncology History Overview Note   Cancer Staging  Malignant neoplasm of upper-outer quadrant of left breast in female, estrogen receptor positive ([place]) Staging form: Breast, AJCC 8th Edition - Clinical stage from 06/01/2021: Stage IIA (cT2, cN0, cM0, G3, ER+, PR+, HER2-) - Unsigned      Malignant neoplasm of upper-outer quadrant of left breast in female, estrogen receptor positive ([place])  05/02/2021 Mammogram   Exam: 3D Mammogram Diagnostic Bilateral IMPRESSION: Palpable 2.4 cm irregular high density mass in the upper-outer left breast.  Exam: Breast Ultrasound - Left IMPRESSION: 1. 2.3 cm irregular mass in left breast at 1 o'clock posterior depth, 13 mc from nipple. 2. 0.9 cm irregular mass in left breast at 1 o'clock posterior depth, 10 cm from nipple 3. Three small 3-4 mm round masses favored to represent small cysts seen at 2:30-3:00 11-12 cm from nipple. 4. No abnormal nodes in left axilla.   05/23/2021 Initial Biopsy   Diagnosis 1. Breast, left, needle core biopsy, breast mass, 2.3 cm @ 1:00 13 CMFN  - INVASIVE DUCTAL CARCINOMA, grade 3  2. Breast, left, needle core biopsy, breast mass, 0.9 cm @ 1:00 10 CMFN  - INVASIVE DUCTAL CARCINOMA, grade 1  1. PROGNOSTIC INDICATORS Results: The tumor cells are EQUIVOCAL for Her2 (2+) Estrogen Receptor: 100%, POSITIVE, STRONG STAINING INTENSITY Progesterone Receptor: 60%, POSITIVE, STRONG STAINING INTENSITY Proliferation Marker Ki67: 40%  1. FLUORESCENCE IN-SITU HYBRIDIZATION Results: GROUP 5: HER2 **NEGATIVE**  2. PROGNOSTIC INDICATORS Results: The tumor cells are NEGATIVE for Her2 (1+) Estrogen Receptor: 100%, POSITIVE, STRONG STAINING INTENSITY Progesterone Receptor: 100%, POSITIVE, STRONG  STAINING INTENSITY Proliferation Marker Ki67: 10%   05/30/2021 Initial Diagnosis    Malignant neoplasm of upper-outer quadrant of left breast in female, estrogen receptor positive ([place])   06/07/2021 Definitive Surgery   FINAL MICROSCOPIC DIAGNOSIS:   A. BREAST, LEFT, LUMPECTOMY:  Invasive ductal carcinomas (two independent lesions, 2.5 cm and 0.8 cm) with clear margins of resection.  Please see the synoptic report after specimen B.   B. LYMPH NODE, LEFT AXILLARY #1, SENTINEL, EXCISION:  Hyperplastic lymph node with fatty infiltration which is negative for  metastatic carcinoma.    06/07/2021 Oncotype testing   Oncotype DX was obtained on the final surgical sample and the recurrence score of 28 predicts a risk of recurrence outside the breast over the next 9 years of 18%, if the patient's only systemic therapy is an antiestrogen for 5 years.  It also predicts a significant benefit from chemotherapy.    06/07/2021 Cancer Staging   Staging form: Breast, AJCC 8th Edition - Pathologic stage from 06/07/2021: Stage IB (pT2, pN0, cM0, G3, ER+, PR+, HER2-, Oncotype DX score: 29) - Signed by Truitt Merle, MD on 08/05/2021 Stage prefix: Initial diagnosis Multigene prognostic tests performed: Oncotype DX Recurrence score range: Greater than or equal to 11 Histologic grading system: 3 grade system   07/15/2021 -  Chemotherapy   Patient is on Treatment Plan : BREAST TC q21d     10/10/2021 - 11/04/2021 Radiation Therapy   Site Technique Total Dose (Gy) Dose per Fx (Gy) Completed Fx Beam Energies  Breast, Left: Breast_L 3D 40.05/40.05 2.67 15/15 10X  Breast, Left: Breast_L_Bst 3D 10/10 2 5/5 6X, 10X     11/2021 -  Anti-estrogen oral therapy   Tamoxifen      INTERVAL HISTORY:  Christy Young is here for a follow up of breast cancer. She was last seen by me on 10/24/21 with survivorship in the interim. She presents to the clinic alone. She reports she has mostly recovered from chemo, with lingering fatigue. She notes she tries to stay active. She adds that the fatigue could be related  to her hyperparathyroidism.   All other systems were reviewed with the patient and are negative.  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  ALLERGIES:  is allergic to pravastatin,  rosuvastatin, and penicillins.  MEDICATIONS:  Current Outpatient Medications  Medication Sig Dispense Refill   allopurinol (ZYLOPRIM) 100 MG tablet TAKE 2 TABLETS BY MOUTH EVERY DAY, SCHEDULE APPOINTMENT FOR REFILLS 180 tablet 1   atorvastatin (LIPITOR) 40 MG tablet Take 1 tablet (40 mg total) by mouth daily. 90 tablet 3   Cholecalciferol (VITAMIN D3) 125 MCG (5000 UT) CAPS Take 1 capsule (5,000 Units total) by mouth daily. 100 capsule 3   nebivolol (BYSTOLIC) 5 MG tablet TAKE 1 TABLET DAILY 90 tablet 0   tamoxifen (NOLVADEX) 20 MG tablet Take 1 tablet by mouth daily.     No current facility-administered medications for this visit.    PHYSICAL  EXAMINATION: ECOG PERFORMANCE STATUS: 0 - Asymptomatic  Vitals:   04/26/22 1444  BP: 133/82  Pulse: 72  Resp: 18  Temp: (!) 97.4 F (36.3 C)  SpO2: 98%   Wt Readings from Last 3 Encounters:  04/26/22 205 lb 4 oz (93.1 kg)  04/10/22 201 lb 6.4 oz (91.4 kg)  03/03/22 205 lb 14.4 oz (93.4 kg)     GENERAL:alert, no distress and comfortable SKIN: skin color, texture, turgor are normal, no rashes or significant lesions EYES: normal, Conjunctiva are pink and non-injected, sclera clear  NECK: supple, thyroid normal size, non-tender, without nodularity LYMPH:  no palpable lymphadenopathy in the cervical, axillary LUNGS: clear to auscultation and percussion with normal breathing effort HEART: regular rate & rhythm and no lower extremity edema, (+) mild murmur ABDOMEN:abdomen soft, non-tender and normal bowel sounds Musculoskeletal:no cyanosis of digits and no clubbing  NEURO: alert & oriented x 3 with fluent speech, no focal motor/sensory deficits BREAST: No palpable mass, nodules or adenopathy bilaterally. Breast exam benign.   LABORATORY DATA:  I have reviewed the data as listed    Latest Ref Rng & Units 04/26/2022    2:10 PM 04/10/2022   10:52 AM 10/24/2021    9:40 AM  CBC  WBC 4.0 - 10.5 K/uL 5.5  5.6  3.7   Hemoglobin 12.0 - 15.0 g/dL 12.8  13.3  11.4   Hematocrit 36.0 - 46.0 % 37.8  39.7  35.0   Platelets 150 - 400 K/uL 244  268.0  298         Latest Ref Rng & Units 04/26/2022    2:10 PM 04/10/2022   10:52 AM 02/15/2022    9:19 AM  CMP  Glucose 70 - 99 mg/dL 93  97  99   BUN 8 - 23 mg/dL _0 Creatinine 0.44 - 1.00 mg/dL 0.83  0.92  0.82   Sodium 135 - 145 mmol/L 142  141  141   Potassium 3.5 - 5.1 mmol/L 3.9  4.5  4.3   Chloride 98 - 111 mmol/L 111  107  107   CO2 22 - 32 mmol/L _1 Calcium 8.9 - 10.3 mg/dL 10.4  10.9  11.6   Total Protein 6.5 - 8.1 g/dL 6.9  7.0    Total Bilirubin 0.3 - 1.2 mg/dL 0.7  0.7    Alkaline Phos 38 - 126 U/L 44  48     AST 15 - 41 U/L 14  12    ALT 0 - 44 U/L 13  12        RADIOGRAPHIC STUDIES: I have personally reviewed the radiological images as listed and agreed with the findings in the report. No results found.    No orders of the  defined types were placed in this encounter.  All questions were answered. The patient knows to call the clinic with any problems, questions or concerns. No barriers to learning was detected. The total time spent in the appointment was 25 minutes.     Truitt Merle, MD 04/26/2022   I, Wilburn Mylar, am acting as scribe for Truitt Merle, MD.   I have reviewed the above documentation for accuracy and completeness, and I agree with the above.

## 2022-04-27 ENCOUNTER — Telehealth: Payer: Self-pay | Admitting: Hematology

## 2022-04-27 NOTE — Telephone Encounter (Signed)
Left patient a message regarding 5/2 appointment

## 2022-04-28 ENCOUNTER — Other Ambulatory Visit: Payer: Self-pay

## 2022-05-01 ENCOUNTER — Telehealth: Payer: Self-pay | Admitting: Physical Medicine and Rehabilitation

## 2022-05-01 ENCOUNTER — Ambulatory Visit (INDEPENDENT_AMBULATORY_CARE_PROVIDER_SITE_OTHER): Payer: Medicare Other

## 2022-05-01 VITALS — Ht 64.0 in | Wt 204.0 lb

## 2022-05-01 DIAGNOSIS — Z0000 Encounter for general adult medical examination without abnormal findings: Secondary | ICD-10-CM | POA: Diagnosis not present

## 2022-05-01 DIAGNOSIS — M5416 Radiculopathy, lumbar region: Secondary | ICD-10-CM

## 2022-05-01 NOTE — Progress Notes (Signed)
Subjective:   Christy Young is a 76 y.o. female who presents for Medicare Annual (Subsequent) preventive examination.  Review of Systems    Virtual Visit via Telephone Note  I connected with  Christy Young on 05/01/22 at  3:45 PM EST by telephone and verified that I am speaking with the correct person using two identifiers.  Location: Patient: Home Provider: Office Persons participating in the virtual visit: patient/Nurse Health Advisor   I discussed the limitations, risks, security and privacy concerns of performing an evaluation and management service by telephone and the availability of in person appointments. The patient expressed understanding and agreed to proceed.  Interactive audio and video telecommunications were attempted between this nurse and patient, however failed, due to patient having technical difficulties OR patient did not have access to video capability.  We continued and completed visit with audio only.  Some vital signs may be absent or patient reported.   Christy Peaches, LPN  Cardiac Risk Factors include: advanced age (>58mn, >>51women);hypertension     Objective:    Today's Vitals   05/01/22 1605 05/01/22 1606  Weight: 204 lb (92.5 kg)   Height: '5\' 4"'$  (1.626 m)   PainSc:  9    Body mass index is 35.02 kg/m.     05/01/2022    4:20 PM 10/03/2021    7:51 AM 06/02/2021   12:01 PM 06/01/2021    2:31 PM 04/27/2021   11:16 AM 04/21/2020   11:28 AM 08/01/2016    8:40 AM  Advanced Directives  Does Patient Have a Medical Advance Directive? No No No No Yes Yes No  Does patient want to make changes to medical advance directive?     Yes (MAU/Ambulatory/Procedural Areas - Information given) Yes (MAU/Ambulatory/Procedural Areas - Information given)   Would patient like information on creating a medical advance directive? No - Patient declined No - Patient declined No - Patient declined No - Patient declined       Current Medications (verified) Outpatient  Encounter Medications as of 05/01/2022  Medication Sig   allopurinol (ZYLOPRIM) 100 MG tablet TAKE 2 TABLETS BY MOUTH EVERY DAY, SCHEDULE APPOINTMENT FOR REFILLS   atorvastatin (LIPITOR) 40 MG tablet Take 1 tablet (40 mg total) by mouth daily.   Cholecalciferol (VITAMIN D3) 125 MCG (5000 UT) CAPS Take 1 capsule (5,000 Units total) by mouth daily.   nebivolol (BYSTOLIC) 5 MG tablet TAKE 1 TABLET DAILY   tamoxifen (NOLVADEX) 20 MG tablet Take 1 tablet by mouth daily.   No facility-administered encounter medications on file as of 05/01/2022.    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   Past Surgical History:  Procedure Laterality Date   ABDOMINAL HYSTERECTOMY     BREAST BIOPSY     right side   BREAST LUMPECTOMY WITH RADIOACTIVE SEED AND SENTINEL LYMPH NODE  BIOPSY Left 2021-07-06   Procedure: LEFT BREAST LUMPECTOMY WITH RADIOACTIVE SEED X2 AND AXILLARY SENTINEL LYMPH NODE BIOPSY;  Surgeon: Donnie Mesa, MD;  Location: [facility];  Service: General;  Laterality: Left;   COLONOSCOPY     FACIAL COSMETIC SURGERY  1980   Family History  Problem Relation Age of Onset   Hypertension Mother    Arthritis Mother    Alcohol abuse Father        deceased   Diabetes Father    Lymphoma Sister        non hodgkins   Heart attack Brother    Lung cancer Brother    Lung cancer Brother    Prostate cancer Brother        dx after 26   Lung cancer Brother    Heart  attack Son 78       1999   Hyperparathyroidism Neg Hx    Social History   Socioeconomic History   Marital status: Single    Spouse name: Not on file   Number of children: 2   Years of education: Not on file   Highest education level: Not on file  Occupational History   Occupation: retired   Tobacco Use   Smoking status: Never   Smokeless tobacco: Never  Vaping Use   Vaping Use: Never used  Substance and Sexual Activity   Alcohol use: Yes    Alcohol/week: 3.0 standard drinks of alcohol    Types: 3 Glasses of wine per week   Drug use: No   Sexual activity: Not Currently    Birth control/protection: Surgical    Comment: Hysterectomy  Other Topics Concern   Not on file  Social History Narrative   Occupation: retired Event organiser, 3 yrs of college   Bereaved parent   Single   Moved in 01-05-2023    No pets   [id number]   Sis died of cancer lymphoma 58  and bro now with lung cancer and spread.died 07-07-23.   Social Determinants of Health   Financial Resource Strain: Low Risk  (05/01/2022)   Overall Financial Resource Strain (CARDIA)    Difficulty of Paying Living Expenses: Not hard at all  Food Insecurity: No Food Insecurity (05/01/2022)   Hunger Vital Sign    Worried About Running Out of Food in the Last Year: Never true    Ran Out of Food in the Last Year: Never true  Transportation Needs: No Transportation Needs (05/01/2022)   PRAPARE - Hydrologist (Medical): No    Lack of Transportation (Non-Medical): No  Physical Activity: Insufficiently Active (05/01/2022)   Exercise Vital Sign    Days of Exercise per Week: 7 days    Minutes of Exercise per Session: 10 min  Stress: No Stress Concern Present (05/01/2022)   [place]    Feeling of Stress : Not at all  Social Connections: Socially Isolated (05/01/2022)   Social Connection and Isolation Panel [NHANES]    Frequency of Communication  with Friends and Family: More than three times a week    Frequency of Social Gatherings with Friends and Family: More than three times a week    Attends Religious Services: Never    Marine scientist or Organizations: No    Attends Archivist Meetings: Never    Marital Status: Widowed    Tobacco Counseling Counseling given: Not Answered   Clinical Intake:  Pre-visit preparation  completed: No  Pain : 0-10 Pain Score: 9  Pain Type: Chronic pain (Followed by Orthopedic) Pain Location: Back Pain Orientation: Lower (Followed by Orthopedic) Pain Radiating Towards: Left leg to ankle Pain Descriptors / Indicators: Sharp (Followed by Orthopedic) Pain Onset: More than a month ago (Followed by Orthopedic) Pain Frequency: Constant Pain Relieving Factors: Rx Meds Effect of Pain on Daily Activities: Patient stated Effect on movement  Pain Relieving Factors: Rx Meds  BMI - recorded: 35.02 Nutritional Status: BMI > 30  Obese Nutritional Risks: None Diabetes: No  How often do you need to have someone help you when you read instructions, pamphlets, or other written materials from your doctor or pharmacy?: 1 - Never  Diabetic?  No  Interpreter Needed?: No  Information entered by :: Rolene Arbour LPN   Activities of Daily Living    05/01/2022    4:18 PM 06/07/2021    9:54 AM  In your present state of health, do you have any difficulty performing the following activities:  Hearing? 0   Vision? 0   Difficulty concentrating or making decisions? 0   Walking or climbing stairs? 1   Comment Due to back pain. Followed by Orthopedic   Dressing or bathing? 0   Doing errands, shopping? 0 0  Preparing Food and eating ? N   Using the Toilet? N   In the past six months, have you accidently leaked urine? Y   Comment Wears pads. Followed by PCP   Do you have problems with loss of bowel control? N   Managing your Medications? N   Managing your Finances? N   Housekeeping or  managing your Housekeeping? N     Patient Care Team: [name], Standley Brooking, MD as PCP - General (Internal Medicine) Valinda Party, MD as Referring Physician (Rheumatology) Donnie Mesa, MD as Consulting Physician (General Surgery) Truitt Merle, MD as Consulting Physician (Hematology) Eppie Gibson, MD as Attending Physician (Radiation Oncology) Magnus Sinning, MD as Consulting Physician (Physical Medicine and Rehabilitation) Nigel Mormon, MD as Consulting Physician (Cardiology) [facility], Melanie Crazier, MD as Attending Physician (Endocrinology)  Indicate any recent Medical Services you may have received from other than [facility] providers in the past year (date may be approximate).     Assessment:   This is a routine wellness examination for [facility].  Hearing/Vision screen Hearing Screening - Comments:: Denies hearing difficulties   Vision Screening - Comments:: Wears rx glasses - up to date with routine eye exams with  Patient deferred  Dietary issues and exercise activities discussed: Exercise limited by: None identified;orthopedic condition(s)   Goals Addressed               This Visit's Progress     Stay Healthy (pt-stated)         Depression Screen    05/01/2022    4:16 PM 04/10/2022   10:17 AM 04/27/2021   11:14 AM 04/21/2020   11:26 AM 04/05/2020    9:42 AM 02/18/2020   10:19 AM 02/03/2019   11:16 AM  PHQ 2/9 Scores  PHQ - 2 Score 0 1 1 0 0 0 0  PHQ- 9 Score 0 4    0     Fall Risk    05/01/2022    4:19 PM 04/10/2022   10:18 AM 04/27/2021   11:17 AM 04/21/2020   11:29 AM 04/05/2020    9:42 AM  Fall Risk   Falls in the past year? 0 0 0 0 0  Number falls  in past yr: 0 0 0 0 0  Injury with Fall? 0 0 0 0 0  Risk for fall due to : No Fall Risks No Fall Risks Impaired vision;Impaired mobility Impaired vision   Follow up Falls prevention discussed Falls evaluation completed Falls prevention discussed Falls prevention discussed Falls evaluation completed     FALL RISK PREVENTION PERTAINING TO THE HOME:  Any stairs in or around the home? Yes  If so, are there any without handrails? No  Home free of loose throw rugs in walkways, pet beds, electrical cords, etc? Yes  Adequate lighting in your home to reduce risk of falls? Yes   ASSISTIVE DEVICES UTILIZED TO PREVENT FALLS:  Life alert? No  Use of a cane, walker or w/c? Yes  Grab bars in the bathroom? No  Shower chair or bench in shower? No  Elevated toilet seat or a handicapped toilet? Yes   TIMED UP AND GO:  Was the test performed? No . Audio Visit  Cognitive Function:        05/01/2022    4:21 PM 04/27/2021   11:20 AM 04/21/2020   11:33 AM 08/01/2016    8:44 AM  6CIT Screen  What Year? 0 points 0 points 0 points 0 points  What month? 0 points 0 points 0 points 0 points  What time? 0 points 0 points  0 points  Count back from 20 0 points 0 points 0 points 0 points  Months in reverse 0 points 0 points 0 points 0 points  Repeat phrase 0 points 0 points 0 points 0 points  Total Score 0 points 0 points  0 points    Immunizations Immunization History  Administered Date(s) Administered   Fluad Quad(high Dose 65+) 06/24/2019, 04/05/2020, 04/10/2022   Influenza Split 04/23/2012   Influenza, High Dose Seasonal PF 05/03/2015, 04/11/2017, 03/24/2021   Influenza,inj,Quad PF,6+ Mos 04/11/2013   PFIZER(Purple Top)SARS-COV-2 Vaccination 08/21/2019, 09/18/2019, 04/13/2020, 10/05/2020   Pfizer Covid-19 Vaccine Bivalent Booster 69yr & up 03/24/2021   Pneumococcal Conjugate-13 08/04/2015   Pneumococcal Polysaccharide-23 12/26/2010   Td 03/26/2009    TDAP status: Due, Education has been provided regarding the importance of this vaccine. Advised may receive this vaccine at local pharmacy or Health Dept. Aware to provide a copy of the vaccination record if obtained from local pharmacy or Health Dept. Verbalized acceptance and understanding.  Flu Vaccine status: Up to date  Pneumococcal  vaccine status: Up to date  Covid-19 vaccine status: Completed vaccines  Qualifies for Shingles Vaccine? Yes   Zostavax completed No   Shingrix Completed?: No.    Education has been provided regarding the importance of this vaccine. Patient has been advised to call insurance company to determine out of pocket expense if they have not yet received this vaccine. Advised may also receive vaccine at local pharmacy or Health Dept. Verbalized acceptance and understanding.  Screening Tests Health Maintenance  Topic Date Due   COVID-19 Vaccine (6 - Pfizer risk series) 05/17/2022 (Originally 05/19/2021)   Zoster Vaccines- Shingrix (1 of 2) 08/01/2022 (Originally 10/23/1964)   TETANUS/TDAP  05/02/2023 (Originally 03/27/2019)   Medicare Annual Wellness (AWV)  05/02/2023   Pneumonia Vaccine 76 Years old  Completed   INFLUENZA VACCINE  Completed   DEXA SCAN  Completed   Hepatitis C Screening  Completed   HPV VACCINES  Aged Out   COLONOSCOPY (Pts [phone] coverage will need to be confirmed)  Discontinued    Health Maintenance  There are no preventive care reminders  to display for this patient.   Colorectal cancer screening: No longer required.   Mammogram status: No longer required due to Age.  Bone Density status: Completed 06/08/20. Results reflect: Bone density results: OSTEOPOROSIS. Repeat every   years.  Lung Cancer Screening: (Low Dose CT Chest recommended if Age 2-80 years, 30 pack-year currently smoking OR have quit w/in 15years.) does not qualify.     Additional Screening:  Hepatitis C Screening: does qualify; Completed 08/04/15  Vision Screening: Recommended annual ophthalmology exams for early detection of glaucoma and other disorders of the eye. Is the patient up to date with their annual eye exam?  Yes  Who is the provider or what is the name of the office in which the patient attends annual eye exams? Patient deferred If pt is not established with a provider,  would they like to be referred to a provider to establish care? No .   Dental Screening: Recommended annual dental exams for proper oral hygiene  Community Resource Referral / Chronic Care Management:  CRR required this visit?  No   CCM required this visit?  No      Plan:     I have personally reviewed and noted the following in the patient's chart:   Medical and social history Use of alcohol, tobacco or illicit drugs  Current medications and supplements including opioid prescriptions. Patient is not currently taking opioid prescriptions. Functional ability and status Nutritional status Physical activity Advanced directives List of other physicians Hospitalizations, surgeries, and ER visits in previous 12 months Vitals Screenings to include cognitive, depression, and falls Referrals and appointments  In addition, I have reviewed and discussed with patient certain preventive protocols, quality metrics, and best practice recommendations. A written personalized care plan for preventive services as well as general preventive health recommendations were provided to patient.     Christy Peaches, LPN   16/06[birth year]   Nurse Notes: None

## 2022-05-01 NOTE — Telephone Encounter (Signed)
Spoke with patient and she said it is the same pain on the same side (left) radiating to the knee. She has not had any accidents or falls. She stated the injection from 11/10/21 helped until about a month ago.

## 2022-05-01 NOTE — Telephone Encounter (Signed)
Pt called requesting a call back for an back injection appt. Please call pt at [phone].

## 2022-05-01 NOTE — Patient Instructions (Signed)
Christy Young , Thank you for taking time to come for your Medicare Wellness Visit. I appreciate your ongoing commitment to your health goals. Please review the following plan we discussed and let me know if I can assist you in the future.   These are the goals we discussed:  Goals      Exercise 150 minutes per week (moderate activity)     Will commit to more walking [place]; no one to go with her; to put into schedule      Patient Stated     Use PT to help with movement and less pain     Patient Stated     Get rid of this pain in my back     Weight (lb) < 200 lb (90.7 kg)     Being active and watching her diet Serving Sizes A serving size is a measured amount of food or drink, such as one slice of bread, that has an associated nutrient content. Knowing the serving size of a food or drink can help you determine how much of that food you should consume. What is the size of one serving? The size of one healthy serving depends on the food or drink. To determine a serving size, read the food label. If the food or drink does not have a food label, try to find serving size information online. Or, use the following to estimate the size of one adult serving: Grain  1 slice bread.  bagel.  cup pasta. Vegetable   cup cooked or canned vegetables. 1 cup raw, leafy greens. Fruit   cup canned fruit. 1 medium fruit.  cup dried fruit. Meat and Other Protein Sources  1 oz meat, poultry, or fish.  cup cooked beans. 1 egg.  cup nuts or seeds. 1 Tbsp nut butter.  cup tofu or tempeh. 2 Tbsp hummus. Dairy  An individual container of yogurt (6-8 oz). 1 piece of cheese the size of your thumb (1 oz). 1 cup (8 oz) milk or milk alternative. Fat  A piece the size of one dice. 1 tsp soft margarine. 1 Tbsp mayonnaise. 1 tsp vegetable oil. 1 Tbsp regular salad dressing. 2 Tbsp low-fat salad dressing. How many servings should I eat from each food group each day? The following are the suggested number of  servings to try and have every day from each food group. You can also look at your eating throughout the week and aim for meeting these requirements on most days for overall healthy eating. Grain  6-8 servings. Try to have half of your grains from whole grains, such as whole wheat bread, corn tortillas, oatmeal, brown rice, whole wheat pasta, and bulgur. Vegetable  At least 2-3 servings. Fruit  2 servings. Meat and Other Protein Foods  5-6 servings. Aim to have lean proteins, such as chicken, [place], fish, beans, or tofu. Dairy  3 servings. Choose low-fat or nonfat if you are trying to control your weight. Fat  2-3 servings. Is a serving the same thing as a portion? No. A portion is the actual amount you eat, which may be more than one serving. Knowing the specific serving size of a food and the nutritional information that goes with it can help you make a healthy decision on what size portion to eat. What are some tips to help me learn healthy serving sizes? Check food labels for serving sizes. Many foods that come as a single portion actually contain multiple servings. Determine the serving size of  foods you commonly eat and figure out how large a portion you usually eat. Measure the number of servings that can be held by the bowls, glasses, cups, and plates you typically use. For example, pour your breakfast cereal into your regular bowl and then pour it into a measuring cup. For 1-2 days, measure the serving sizes of all the foods you eat. Practice estimating serving sizes and determining how big your portions should be. This information is not intended to replace advice given to you by your health care provider. Make sure you discuss any questions you have with your health care provider. Document Released: 03/11/2003 Document Revised: 02/05/2016 Document Reviewed: 09/09/2013 Elsevier Interactive Patient Education  2017 [employer].          This is a list of the screening  recommended for you and due dates:  Health Maintenance  Topic Date Due   Zoster (Shingles) Vaccine (1 of 2) Never done   Tetanus Vaccine  03/27/2019   COVID-19 Vaccine (6 - Pfizer risk series) 05/19/2021   Medicare Annual Wellness Visit  05/02/2023   Pneumonia Vaccine  Completed   Flu Shot  Completed   DEXA scan (bone density measurement)  Completed   Hepatitis C Screening: USPSTF Recommendation to screen - Ages 28-79 yo.  Completed   HPV Vaccine  Aged Out   Colon Cancer Screening  Discontinued    Advanced directives: Advance directive discussed with you today. Even though you declined this today, please call our office should you change your mind, and we can give you the proper paperwork for you to fill out.   Conditions/risks identified: None  Next appointment: Follow up in one year for your annual wellness visit    Preventive Care 65 Years and Older, Female Preventive care refers to lifestyle choices and visits with your health care provider that can promote health and wellness. What does preventive care include? A yearly physical exam. This is also called an annual well check. Dental exams once or twice a year. Routine eye exams. Ask your health care provider how often you should have your eyes checked. Personal lifestyle choices, including: Daily care of your teeth and gums. Regular physical activity. Eating a healthy diet. Avoiding tobacco and drug use. Limiting alcohol use. Practicing safe sex. Taking low-dose aspirin every day. Taking vitamin and mineral supplements as recommended by your health care provider. What happens during an annual well check? The services and screenings done by your health care provider during your annual well check will depend on your age, overall health, lifestyle risk factors, and family history of disease. Counseling  Your health care provider may ask you questions about your: Alcohol use. Tobacco use. Drug use. Emotional  well-being. Home and relationship well-being. Sexual activity. Eating habits. History of falls. Memory and ability to understand (cognition). Work and work Statistician. Reproductive health. Screening  You may have the following tests or measurements: Height, weight, and BMI. Blood pressure. Lipid and cholesterol levels. These may be checked every 5 years, or more frequently if you are over 44 years old. Skin check. Lung cancer screening. You may have this screening every year starting at age 31 if you have a 30-pack-year history of smoking and currently smoke or have quit within the past 15 years. Fecal occult blood test (FOBT) of the stool. You may have this test every year starting at age 11. Flexible sigmoidoscopy or colonoscopy. You may have a sigmoidoscopy every 5 years or a colonoscopy every 10 years starting at  age 55. Hepatitis C blood test. Hepatitis B blood test. Sexually transmitted disease (STD) testing. Diabetes screening. This is done by checking your blood sugar (glucose) after you have not eaten for a while (fasting). You may have this done every 1-3 years. Bone density scan. This is done to screen for osteoporosis. You may have this done starting at age 27. Mammogram. This may be done every 1-2 years. Talk to your health care provider about how often you should have regular mammograms. Talk with your health care provider about your test results, treatment options, and if necessary, the need for more tests. Vaccines  Your health care provider may recommend certain vaccines, such as: Influenza vaccine. This is recommended every year. Tetanus, diphtheria, and acellular pertussis (Tdap, Td) vaccine. You may need a Td booster every 10 years. Zoster vaccine. You may need this after age 51. Pneumococcal 13-valent conjugate (PCV13) vaccine. One dose is recommended after age 24. Pneumococcal polysaccharide (PPSV23) vaccine. One dose is recommended after age 90. Talk to your  health care provider about which screenings and vaccines you need and how often you need them. This information is not intended to replace advice given to you by your health care provider. Make sure you discuss any questions you have with your health care provider. Document Released: 07/09/2015 Document Revised: 03/01/2016 Document Reviewed: 04/13/2015 Elsevier Interactive Patient Education  2017 [place] Prevention in the Home Falls can cause injuries. They can happen to people of all ages. There are many things you can do to make your home safe and to help prevent falls. What can I do on the outside of my home? Regularly fix the edges of walkways and driveways and fix any cracks. Remove anything that might make you trip as you walk through a door, such as a raised step or threshold. Trim any bushes or trees on the path to your home. Use bright outdoor lighting. Clear any walking paths of anything that might make someone trip, such as rocks or tools. Regularly check to see if handrails are loose or broken. Make sure that both sides of any steps have handrails. Any raised decks and porches should have guardrails on the edges. Have any leaves, snow, or ice cleared regularly. Use sand or salt on walking paths during winter. Clean up any spills in your garage right away. This includes oil or grease spills. What can I do in the bathroom? Use night lights. Install grab bars by the toilet and in the tub and shower. Do not use towel bars as grab bars. Use non-skid mats or decals in the tub or shower. If you need to sit down in the shower, use a plastic, non-slip stool. Keep the floor dry. Clean up any water that spills on the floor as soon as it happens. Remove soap buildup in the tub or shower regularly. Attach bath mats securely with double-sided non-slip rug tape. Do not have throw rugs and other things on the floor that can make you trip. What can I do in the bedroom? Use night  lights. Make sure that you have a light by your bed that is easy to reach. Do not use any sheets or blankets that are too big for your bed. They should not hang down onto the floor. Have a firm chair that has side arms. You can use this for support while you get dressed. Do not have throw rugs and other things on the floor that can make you trip. What can I  do in the kitchen? Clean up any spills right away. Avoid walking on wet floors. Keep items that you use a lot in easy-to-reach places. If you need to reach something above you, use a strong step stool that has a grab bar. Keep electrical cords out of the way. Do not use floor polish or wax that makes floors slippery. If you must use wax, use non-skid floor wax. Do not have throw rugs and other things on the floor that can make you trip. What can I do with my stairs? Do not leave any items on the stairs. Make sure that there are handrails on both sides of the stairs and use them. Fix handrails that are broken or loose. Make sure that handrails are as long as the stairways. Check any carpeting to make sure that it is firmly attached to the stairs. Fix any carpet that is loose or worn. Avoid having throw rugs at the top or bottom of the stairs. If you do have throw rugs, attach them to the floor with carpet tape. Make sure that you have a light switch at the top of the stairs and the bottom of the stairs. If you do not have them, ask someone to add them for you. What else can I do to help prevent falls? Wear shoes that: Do not have high heels. Have rubber bottoms. Are comfortable and fit you well. Are closed at the toe. Do not wear sandals. If you use a stepladder: Make sure that it is fully opened. Do not climb a closed stepladder. Make sure that both sides of the stepladder are locked into place. Ask someone to hold it for you, if possible. Clearly mark and make sure that you can see: Any grab bars or handrails. First and last  steps. Where the edge of each step is. Use tools that help you move around (mobility aids) if they are needed. These include: Canes. Walkers. Scooters. Crutches. Turn on the lights when you go into a dark area. Replace any light bulbs as soon as they burn out. Set up your furniture so you have a clear path. Avoid moving your furniture around. If any of your floors are uneven, fix them. If there are any pets around you, be aware of where they are. Review your medicines with your doctor. Some medicines can make you feel dizzy. This can increase your chance of falling. Ask your doctor what other things that you can do to help prevent falls. This information is not intended to replace advice given to you by your health care provider. Make sure you discuss any questions you have with your health care provider. Document Released: 04/08/2009 Document Revised: 11/18/2015 Document Reviewed: 07/17/2014 Elsevier Interactive Patient Education  2017 [employer].

## 2022-05-01 NOTE — Telephone Encounter (Signed)
Referral placed.

## 2022-05-08 ENCOUNTER — Ambulatory Visit: Payer: Self-pay

## 2022-05-08 ENCOUNTER — Ambulatory Visit (INDEPENDENT_AMBULATORY_CARE_PROVIDER_SITE_OTHER): Payer: Medicare Other | Admitting: Physical Medicine and Rehabilitation

## 2022-05-08 VITALS — BP 120/76 | HR 83

## 2022-05-08 DIAGNOSIS — M5416 Radiculopathy, lumbar region: Secondary | ICD-10-CM

## 2022-05-08 MED ORDER — METHYLPREDNISOLONE ACETATE 80 MG/ML IJ SUSP
40.0000 mg | Freq: Once | INTRAMUSCULAR | Status: AC
  Administered 2022-05-08: 40 mg

## 2022-05-08 NOTE — Progress Notes (Signed)
Numeric Pain Rating Scale and Functional Assessment Average Pain 7   In the last MONTH (on 0-10 scale) has pain interfered with the following?  1. General activity like being  able to carry out your everyday physical activities such as walking, climbing stairs, carrying groceries, or moving a chair?  Rating( unsure )   +Driver, -BT, -Dye Allergies.  Left lower back pain that radiates down the left leg. Pain comes without activity. Comes all of a sudden. Lies down or sits down to ease pain

## 2022-05-08 NOTE — Patient Instructions (Signed)

## 2022-05-09 ENCOUNTER — Telehealth: Payer: Self-pay | Admitting: *Deleted

## 2022-05-09 ENCOUNTER — Encounter: Payer: Self-pay | Admitting: *Deleted

## 2022-05-09 NOTE — Patient Outreach (Signed)
  Care Coordination   Initial Visit Note   05/09/2022 Name: Christy Young MRN: [id number] DOB: 29-Sep-1945  Christy Young is a 76 y.o. year old female who sees [name], Standley Brooking, MD for primary care. I spoke with  Christy Young by phone today.  What matters to the patients health and wellness today?  No needs    Goals Addressed               This Visit's Progress     COMPLETED: No needs (pt-stated)        Care Coordination Interventions: Reviewed medications with patient and discussed adherence with all prescribed medication with no needed refills Reviewed scheduled/upcoming provider appointments including pending appointments with sufficient transportation Assessed social determinant of health barriers          SDOH assessments and interventions completed:  Yes  SDOH Interventions Today    Flowsheet Row Most Recent Value  SDOH Interventions   Food Insecurity Interventions Intervention Not Indicated  Housing Interventions Intervention Not Indicated  Transportation Interventions Intervention Not Indicated  Utilities Interventions Intervention Not Indicated        Care Coordination Interventions Activated:  Yes  Care Coordination Interventions:  Yes, provided   Follow up plan: No further intervention required.   Encounter Outcome:  Pt. Visit Completed   Raina Mina, RN Care Management Coordinator [place] Office [phone]

## 2022-05-09 NOTE — Patient Instructions (Signed)
Visit Information  Thank you for taking time to visit with me today. Please don't hesitate to contact me if I can be of assistance to you.   Following are the goals we discussed today:   Goals Addressed               This Visit's Progress     COMPLETED: No needs (pt-stated)        Care Coordination Interventions: Reviewed medications with patient and discussed adherence with all prescribed medication with no needed refills Reviewed scheduled/upcoming provider appointments including pending appointments with sufficient transportation Assessed social determinant of health barriers          Please call the care guide team at [phone] if you need to cancel or reschedule your appointment.   If you are experiencing a Mental Health or [place] or need someone to talk to, please call the Suicide and Crisis Lifeline: 988  Patient verbalizes understanding of instructions and care plan provided today and agrees to view in [place]. Active MyChart status and patient understanding of how to access instructions and care plan via MyChart confirmed with patient.     No further follow up required: No needs    Raina Mina, RN Care Management Coordinator [place] Office [phone]

## 2022-05-09 NOTE — Patient Outreach (Signed)
  Care Coordination   05/09/2022 Name: Christy Young MRN: [id number] DOB: [birth year]   Care Coordination Outreach Attempts:  An unsuccessful telephone outreach was attempted today to offer the patient information about available care coordination services as a benefit of their health plan.   Follow Up Plan:  Additional outreach attempts will be made to offer the patient care coordination information and services.   Encounter Outcome:  No Answer  Care Coordination Interventions Activated:  No   Care Coordination Interventions:  No, not indicated    Raina Mina, RN Care Management Coordinator [place] Office [phone]

## 2022-05-17 NOTE — Progress Notes (Signed)
Christy Young - 76 y.o. female MRN [id number]  Date of birth: [birth year]  Office Visit Note: Visit Date: 05/08/2022 PCP: Burnis Medin, MD Referred by: Burnis Medin, MD  Subjective: Chief Complaint  Patient presents with   Lower Back - Pain   HPI:  Christy Young is a 76 y.o. female who comes in today for planned repeat Right L5-S1  Lumbar Transforaminal epidural steroid injection with fluoroscopic guidance.  The patient has failed conservative care including home exercise, medications, time and activity modification.  This injection will be diagnostic and hopefully therapeutic.  Please see requesting physician notes for further details and justification. Patient received more than 50% pain relief from prior injection.   Referring: Dr. Eunice Blase and Barnet Pall, FNP and Eppie Gibson, MD   ROS Otherwise per HPI.  Assessment & Plan: Visit Diagnoses:    ICD-10-CM   1. Lumbar radiculopathy  M54.16 XR C-ARM NO REPORT    Epidural Steroid injection    methylPREDNISolone acetate (DEPO-MEDROL) injection 40 mg      Plan: No additional findings.   Meds & Orders:  Meds ordered this encounter  Medications   methylPREDNISolone acetate (DEPO-MEDROL) injection 40 mg    Orders Placed This Encounter  Procedures   XR C-ARM NO REPORT   Epidural Steroid injection    Follow-up: Return for visit to requesting provider as needed.   Procedures: No procedures performed  Lumbosacral Transforaminal Epidural Steroid Injection - Sub-Pedicular Approach with Fluoroscopic Guidance  Patient: Christy Young      Date of Birth: 19-Dec-1945 MRN: [id number] PCP: Burnis Medin, MD      Visit Date: 05/08/2022   Universal Protocol:    Date/Time: 05/08/2022  Consent Given By: the patient  Position: PRONE  Additional Comments: Vital signs were monitored before and after the procedure. Patient was prepped and draped in the usual sterile fashion. The correct patient, procedure, and site  was verified.   Injection Procedure Details:   Procedure diagnoses: Lumbar radiculopathy [M54.16]    Meds Administered:  Meds ordered this encounter  Medications   methylPREDNISolone acetate (DEPO-MEDROL) injection 40 mg    Laterality: Right  Location/Site: L5  Needle:5.0 in., 22 ga.  Short bevel or Quincke spinal needle  Needle Placement: Transforaminal  Findings:    -Comments: Excellent flow of contrast along the nerve, nerve root and into the epidural space.  Procedure Details: After squaring off the end-plates to get a true AP view, the C-arm was positioned so that an oblique view of the foramen as noted above was visualized. The target area is just inferior to the "nose of the scotty dog" or sub pedicular. The soft tissues overlying this structure were infiltrated with 2-3 ml. of 1% Lidocaine without Epinephrine.  The spinal needle was inserted toward the target using a "trajectory" view along the fluoroscope beam.  Under AP and lateral visualization, the needle was advanced so it did not puncture dura and was located close the 6 O'Clock position of the pedical in AP tracterory. Biplanar projections were used to confirm position. Aspiration was confirmed to be negative for CSF and/or blood. A 1-2 ml. volume of Isovue-250 was injected and flow of contrast was noted at each level. Radiographs were obtained for documentation purposes.   After attaining the desired flow of contrast documented above, a 0.5 to 1.0 ml test dose of 0.25% Marcaine was injected into each respective transforaminal space.  The patient was observed for 90 seconds post injection.  After no sensory deficits were reported, and normal lower extremity motor function was noted,   the above injectate was administered so that equal amounts of the injectate were placed at each foramen (level) into the transforaminal epidural space.   Additional Comments:  The patient tolerated the procedure well Dressing: 2 x 2  sterile gauze and Band-Aid    Post-procedure details: Patient was observed during the procedure. Post-procedure instructions were reviewed.  Patient left the clinic in stable condition.    Clinical History: MRI LUMBAR SPINE WITHOUT AND WITH CONTRAST   TECHNIQUE: Multiplanar and multiecho pulse sequences of the lumbar spine were obtained without and with intravenous contrast.   CONTRAST:  57m GADAVIST GADOBUTROL 1 MMOL/ML IV SOLN   COMPARISON:  07/05/2020   FINDINGS: Segmentation:  5 lumbar type vertebrae   Alignment:  L5-S1 grade 1/2 anterolisthesis.  Mild scoliosis.   Vertebrae: Chronic L5 pars defects. No evidence of fracture, discitis, or bone lesion. Sacroiliac osteoarthritis.   Conus medullaris and cauda equina: Conus extends to the L1 level. Conus and cauda equina appear normal. 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   Paraspinal and other soft tissues: Negative for perispinal mass or inflammation.   Disc levels:   T12- L1: Unremarkable.   L1-L2: Mild disc bulging.  Mild facet spurring   L2-L3: Disc narrowing and bulging with mild facet spurring. The canal and foramina are patent   L3-L4: Degenerative facet spurring, bulky on the right. Mild disc bulging.   L4-L5: Degenerative disc collapse with endplate and facet spurring bilaterally. The canal and foramina are patent   L5-S1:L5 chronic pars defects with anterolisthesis, disc collapse, disc bulging. Degenerative facet spurring. Biforaminal L5 compression.   IMPRESSION: 1. 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 2. Noncompressive spinal degeneration elsewhere.  Mild scoliosis. 3. Negative for metastatic disease.     Electronically Signed   By: JJorje GuildM.D.   On: 10/24/2021 07:27     Objective:  VS:  HT:    WT:   BMI:     BP:120/76  HR:83bpm  TEMP: ( )  RESP:  Physical Exam   Imaging: No results found.

## 2022-05-17 NOTE — Procedures (Signed)
Lumbosacral Transforaminal Epidural Steroid Injection - Sub-Pedicular Approach with Fluoroscopic Guidance  Patient: Christy Young      Date of Birth: [birth year] MRN: [id number] PCP: Burnis Medin, MD      Visit Date: 05/08/2022   Universal Protocol:    Date/Time: 05/08/2022  Consent Given By: the patient  Position: PRONE  Additional Comments: Vital signs were monitored before and after the procedure. Patient was prepped and draped in the usual sterile fashion. The correct patient, procedure, and site was verified.   Injection Procedure Details:   Procedure diagnoses: Lumbar radiculopathy [M54.16]    Meds Administered:  Meds ordered this encounter  Medications   methylPREDNISolone acetate (DEPO-MEDROL) injection 40 mg    Laterality: Right  Location/Site: L5  Needle:5.0 in., 22 ga.  Short bevel or Quincke spinal needle  Needle Placement: Transforaminal  Findings:    -Comments: Excellent flow of contrast along the nerve, nerve root and into the epidural space.  Procedure Details: After squaring off the end-plates to get a true AP view, the C-arm was positioned so that an oblique view of the foramen as noted above was visualized. The target area is just inferior to the "nose of the scotty dog" or sub pedicular. The soft tissues overlying this structure were infiltrated with 2-3 ml. of 1% Lidocaine without Epinephrine.  The spinal needle was inserted toward the target using a "trajectory" view along the fluoroscope beam.  Under AP and lateral visualization, the needle was advanced so it did not puncture dura and was located close the 6 O'Clock position of the pedical in AP tracterory. Biplanar projections were used to confirm position. Aspiration was confirmed to be negative for CSF and/or blood. A 1-2 ml. volume of Isovue-250 was injected and flow of contrast was noted at each level. Radiographs were obtained for documentation purposes.   After attaining the desired flow  of contrast documented above, a 0.5 to 1.0 ml test dose of 0.25% Marcaine was injected into each respective transforaminal space.  The patient was observed for 90 seconds post injection.  After no sensory deficits were reported, and normal lower extremity motor function was noted,   the above injectate was administered so that equal amounts of the injectate were placed at each foramen (level) into the transforaminal epidural space.   Additional Comments:  The patient tolerated the procedure well Dressing: 2 x 2 sterile gauze and Band-Aid    Post-procedure details: Patient was observed during the procedure. Post-procedure instructions were reviewed.  Patient left the clinic in stable condition.

## 2022-06-05 ENCOUNTER — Ambulatory Visit: Payer: Medicare Other | Attending: Surgery

## 2022-06-05 VITALS — Wt 206.1 lb

## 2022-06-05 DIAGNOSIS — Z483 Aftercare following surgery for neoplasm: Secondary | ICD-10-CM | POA: Insufficient documentation

## 2022-06-05 NOTE — Therapy (Signed)
OUTPATIENT PHYSICAL THERAPY SOZO SCREENING NOTE   Patient Name: Christy Young MRN: [id number] DOB:[birth year], 76 y.o., female Today's Date: 06/05/2022  PCP: Burnis Medin, MD REFERRING PROVIDER: Donnie Mesa, MD   PT End of Session - 06/05/22 0915     Visit Number 2   # unchanged due to screen only   PT Start Time 0913    PT Stop Time 0918    PT Time Calculation (min) 5 min    Activity Tolerance Patient tolerated treatment well    Behavior During Therapy [facility] for tasks assessed/performed             Past Medical History:  Diagnosis Date   ARTHRITIS    Breast cancer ([place])    Carpal tunnel syndrome 08/23/2015   Left   Exertional dyspnea    Fatigue    Gout, unspecified    Hyperlipidemia    Hypertension    meds since age 74    Hyperuricemia    Nonspecific abnormal electrocardiogram (ECG) (EKG)    t wave non acute     OBESITY    Palpitations    hospitalized 2005 felt from stress neg cards eval   Primary hyperparathyroidism ([place])    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   Past Surgical History:  Procedure Laterality Date   ABDOMINAL HYSTERECTOMY     BREAST BIOPSY     right side   BREAST LUMPECTOMY WITH RADIOACTIVE SEED AND SENTINEL LYMPH NODE BIOPSY Left 06/07/2021   Procedure: LEFT BREAST LUMPECTOMY WITH RADIOACTIVE SEED X2 AND AXILLARY SENTINEL LYMPH NODE BIOPSY;  Surgeon: Donnie Mesa, MD;  Location: [facility];  Service: General;  Laterality: Left;   [place]   Patient Active Problem List   Diagnosis Date Noted   Malignant neoplasm of upper-outer quadrant of left breast in female, estrogen receptor positive ([place]) 05/30/2021   Pain of left lower extremity 03/16/2021   Exertional dyspnea 03/08/2021   Decreased exercise tolerance 03/08/2021   Hyperparathyroidism ([place]) 02/09/2018   Abnormal prominence of clavicle 02/07/2018   Psoriasis 10/23/2017   Carpal tunnel syndrome 08/23/2015   Ulnar  neuropathy at elbow of left upper extremity 08/23/2015   H/O: gout 08/08/2015   Elevated blood sugar 08/08/2015   Essential hypertension 07/09/2014   Mixed hyperlipidemia 07/09/2014   Body aches 07/09/2014   Urinary frequency 07/09/2014   Numbness and tingling in left hand 07/09/2014   Visit for preventive health examination 04/11/2013   Obesity (BMI 30-39.9) 04/11/2013   Statin intolerance 04/11/2013   At risk for coronary artery disease 05/15/2012   Family history of premature coronary heart disease 04/23/2012   Fatigue 12/29/2010   Nonspecific abnormal electrocardiogram (ECG) (EKG) 12/29/2010   Hyperuricemia 12/29/2010   Palpitations    OBESITY 01/10/2010   HYPERLIPIDEMIA 11/09/2009   Gout 11/09/2009   HYPERTENSION 11/09/2009   ARTHRITIS 11/09/2009    REFERRING DIAG: left breast cancer at risk for lymphedema  THERAPY DIAG: Aftercare following surgery for neoplasm  PERTINENT HISTORY: Patient underwent a left lumpectomy and sentinel node biopsy (1 negative node) on 06/07/2021. It is ER/PR positive and HER2 negative with a Ki67 of 40%.   PRECAUTIONS: left UE Lymphedema risk, None  SUBJECTIVE: Pt returns for her 3 month L-Dex screen. "I felt like my arm was getting biger again so I started wearing my compression sleeve again a week ago."   PAIN:  Are you having pain? No  SOZO SCREENING: Patient  was assessed today using the SOZO machine to determine the lymphedema index score. This was compared to her baseline score. It was determined that she is back within the recommended range when compared to her baseline and no further action is needed at this time, though did suggest she cont to wewar her compression sleeve when increasing her HR with activities, I.e. walking. Also when she flies to [facility] for upcoming trip. She will continue SOZO screenings. These are done every 3 months for 2 years post operatively followed by every 6 months for 2 years, and then annually.   L-DEX  FLOWSHEETS - 06/05/22 0900       L-DEX LYMPHEDEMA SCREENING   Measurement Type Unilateral    L-DEX MEASUREMENT EXTREMITY Upper Extremity    POSITION  Standing    DOMINANT SIDE Right    At Risk Side Left    BASELINE SCORE (UNILATERAL) 2.9    L-DEX SCORE (UNILATERAL) 7.4    VALUE CHANGE (UNILAT) 4.5               Christy Young, PTA 06/05/2022, 9:18 AM

## 2022-06-07 DIAGNOSIS — Z853 Personal history of malignant neoplasm of breast: Secondary | ICD-10-CM | POA: Diagnosis not present

## 2022-06-13 ENCOUNTER — Encounter: Payer: Self-pay | Admitting: Hematology

## 2022-06-21 ENCOUNTER — Other Ambulatory Visit: Payer: Self-pay | Admitting: Internal Medicine

## 2022-06-22 ENCOUNTER — Ambulatory Visit: Payer: PRIVATE HEALTH INSURANCE | Admitting: Cardiology

## 2022-06-28 DIAGNOSIS — E782 Mixed hyperlipidemia: Secondary | ICD-10-CM | POA: Diagnosis not present

## 2022-06-29 ENCOUNTER — Ambulatory Visit: Payer: Medicare Other | Admitting: Cardiology

## 2022-06-29 ENCOUNTER — Encounter: Payer: Self-pay | Admitting: Cardiology

## 2022-06-29 VITALS — BP 119/72 | HR 83 | Resp 16 | Ht 64.0 in | Wt 202.0 lb

## 2022-06-29 DIAGNOSIS — T466X5A Adverse effect of antihyperlipidemic and antiarteriosclerotic drugs, initial encounter: Secondary | ICD-10-CM | POA: Diagnosis not present

## 2022-06-29 DIAGNOSIS — E782 Mixed hyperlipidemia: Secondary | ICD-10-CM

## 2022-06-29 DIAGNOSIS — R0609 Other forms of dyspnea: Secondary | ICD-10-CM | POA: Diagnosis not present

## 2022-06-29 DIAGNOSIS — G720 Drug-induced myopathy: Secondary | ICD-10-CM | POA: Diagnosis not present

## 2022-06-29 LAB — LIPID PANEL
Chol/HDL Ratio: 4.2 ratio (ref 0.0–4.4)
Cholesterol, Total: 212 mg/dL — ABNORMAL HIGH (ref 100–199)
HDL: 50 mg/dL (ref >39)
LDL Chol Calc (NIH): 138 mg/dL — ABNORMAL HIGH (ref 0–99)
Triglycerides: 134 mg/dL (ref 0–149)
VLDL Cholesterol Cal: 24 mg/dL (ref 5–40)

## 2022-06-29 NOTE — Progress Notes (Signed)
Patient referred by Burnis Medin, MD for dyspnea on exertion, hyperlipidemia  Subjective:   Christy Young, female DOB: [birth year], 77 y.o.    DOB: 1946-02-27, 77 y.o.   MRN: [id number]   No chief complaint on file.    HPI  77 y.o. African-American female with primary hyperparathyroidism, obesity, arthritis, mixed hyperlipidemia, s/p lumpectomy, chemoradiation for breast cancer  She is overall doing well. Has good days and bad days. She has occasional "pinching sensation" in her chest lasting for a few seconds. She denies any exertional angina symptoms.   Reviewed recent test results with the patient, details below.    Initial consultation HPI 02/2021: Patient is retired, lives fairly sedentary lifestyle.  Her activity has been limited due to multiple complaints appointments for about a year or so.  Recently, she will experience dyspnea on exertion with minimal activity.  She denies any orthopnea, PND, regular symptoms.  Also denies any chest pain.  She has had hyperlipidemia for several years.  In the past, she was on Lipitor, which was stopped due to myalgias.  She was then started on pravastatin, but patient admits to not taking it regularly.  Currently, she is not on lipid-lowering therapy.  On a separate note, patient complains of pain in the left leg for several years.  She is very concerned about a clot in this leg.   Current Outpatient Medications:    allopurinol (ZYLOPRIM) 100 MG tablet, TAKE 2 TABLETS BY MOUTH EVERY DAY, SCHEDULE APPOINTMENT FOR REFILLS, Disp: 180 tablet, Rfl: 1   atorvastatin (LIPITOR) 40 MG tablet, Take 1 tablet (40 mg total) by mouth daily., Disp: 90 tablet, Rfl: 3   Cholecalciferol (VITAMIN D3) 125 MCG (5000 UT) CAPS, Take 1 capsule (5,000 Units total) by mouth daily., Disp: 100 capsule, Rfl: 3   nebivolol (BYSTOLIC) 5 MG tablet, TAKE 1 TABLET DAILY, Disp: 90 tablet, Rfl: 0   tamoxifen (NOLVADEX) 20 MG tablet, Take 1 tablet by mouth daily., Disp: , Rfl:   Cardiovascular  and other pertinent studies:  EKG 06/29/2022: Sinus rhythm 83 bpm  Left axis deviation Nonspecific T-abnormality  Echocardiogram 12/08/2021:  Left ventricle cavity is normal in size. Moderate concentric hypertrophy  of the left ventricle. Normal global wall motion. Normal LV systolic  function with EF 60%. Doppler evidence of grade I (impaired) diastolic  dysfunction, normal LAP.  Structurally normal trileaflet aortic valve.  Mild (Grade I) aortic  regurgitation.  Structurally normal mitral valve.  Mild (Grade I) mitral regurgitation.  Mild tricuspid regurgitation. Estimated pulmonary artery systolic pressure  40 mmHg  Mild pulmonic regurgitation.  Previous study on 04/01/2021 reported severe asymmetric hypertrophy, mod  AI, mild to mod MR & TR, estimated PASP 31 mmHg.  Lower Extremity Venous Duplex (left)  04/01/2021:  No evidence of deep vein thrombosis of the left lower extremity with  normal venous return. Right CFV not imaged.  EKG 03/16/2021: Sinus rhythm 83 bpm Left atrial enlargement.  Poor R-wave progression -nonspecific   Recent labs: 06/28/2022: Chol 212, TG 134, HDL 50, LDL 138  10/24/2021: Glucose 114, BUN/Cr 12/0.66. EGFR >60. Na/K 141/3.9. Protein 6.1. Rest of the CMP normal H/H 11/35. MCV 94. Platelets 298  02/16/2021: Glucose 89, BUN/Cr 16/0.79. EGFR 73. Na/K 143/3.9. Rest of the CMP normal H/H 13/40. MCV 90. Platelets 285 HbA1C 5.9% Chol 289, TG 326, HDL 57, LDL 193 TSH 0.34 low, free T4 0.61 normal    Review of Systems  Cardiovascular:  Negative for chest pain, dyspnea on exertion, leg swelling, palpitations  and syncope.  Musculoskeletal:        Left calf pain          Vitals:   06/29/22 1409  BP: 119/72  Pulse: 83  Resp: 16  SpO2: 95%     Body mass index is 34.67 kg/m. Filed Weights   06/29/22 1409  Weight: 202 lb (91.6 kg)     Objective:   Physical Exam Vitals and nursing note reviewed.  Constitutional:      General: She is not  in acute distress.    Appearance: She is obese.  Neck:     Vascular: No JVD.  Cardiovascular:     Rate and Rhythm: Normal rate and regular rhythm.     Pulses: Normal pulses.     Heart sounds: Normal heart sounds. No murmur heard. Pulmonary:     Effort: Pulmonary effort is normal.     Breath sounds: Normal breath sounds. No wheezing or rales.  Musculoskeletal:     Right lower leg: No edema.     Left lower leg: No edema.         Assessment & Recommendations:   77 y.o. African-American female with primary hyperparathyroidism, obesity, arthritis, mixed hyperlipidemia, s/p lumpectomy, chemoradiation for breast cancer  Dyspnea on exertion: Resolved. 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 Chol 289, TG 326, HDL 57, LDL 193 (01/2021) Chol 212, TG 134, HDL 50, LDL 138 (06/2022) In addition to continued diet and lifestlye modification, recommend adding Leqvio once every 6 months Repeat lipid panel in 6 months  Hypertension: Controlled  F/u in 6 months    Nigel Mormon, MD Pager: [phone] Office: [phone]

## 2022-07-12 ENCOUNTER — Encounter: Payer: Self-pay | Admitting: Hematology

## 2022-07-24 ENCOUNTER — Encounter: Payer: Self-pay | Admitting: Hematology

## 2022-07-28 ENCOUNTER — Other Ambulatory Visit: Payer: Self-pay | Admitting: Hematology

## 2022-08-01 DIAGNOSIS — E785 Hyperlipidemia, unspecified: Secondary | ICD-10-CM | POA: Diagnosis not present

## 2022-08-11 ENCOUNTER — Other Ambulatory Visit: Payer: Self-pay

## 2022-08-11 ENCOUNTER — Telehealth: Payer: Self-pay | Admitting: Pharmacy Technician

## 2022-08-11 NOTE — Telephone Encounter (Signed)
Auth Submission: NO AUTH NEEDED Payer: MEDICARE A/B & EMPIRE SUPP Medication & CPT/J Code(s) submitted: Leqvio (Inclisiran) J1306 Route of submission (phone, fax, portal):  Phone # Fax # Auth type: Buy/Bill Units/visits requested: X3 Reference number: [id number] (EMPIRE SUPP) Approval from: 08/11/22 to 08/12/23

## 2022-08-14 ENCOUNTER — Encounter: Payer: Self-pay | Admitting: Cardiology

## 2022-08-14 ENCOUNTER — Encounter: Payer: Self-pay | Admitting: Hematology

## 2022-08-15 ENCOUNTER — Ambulatory Visit (INDEPENDENT_AMBULATORY_CARE_PROVIDER_SITE_OTHER): Payer: Medicare Other

## 2022-08-15 VITALS — BP 122/80 | HR 79 | Temp 97.3°F | Resp 18 | Ht 65.0 in | Wt 206.0 lb

## 2022-08-15 DIAGNOSIS — E782 Mixed hyperlipidemia: Secondary | ICD-10-CM

## 2022-08-15 MED ORDER — INCLISIRAN SODIUM 284 MG/1.5ML ~~LOC~~ SOSY
284.0000 mg | PREFILLED_SYRINGE | Freq: Once | SUBCUTANEOUS | Status: AC
  Administered 2022-08-15: 284 mg via SUBCUTANEOUS

## 2022-08-15 NOTE — Patient Instructions (Signed)
Inclisiran Injection What is this medication? INCLISIRAN (in [place] an) treats high cholesterol. It works by decreasing bad cholesterol (such as LDL) in your blood. Changes to diet and exercise are often combined with this medication. This medicine may be used for other purposes; ask your health care provider or pharmacist if you have questions. COMMON BRAND NAME(S): LEQVIO What should I tell my care team before I take this medication? They need to know if you have any of these conditions: An unusual or allergic reaction to inclisiran, other medications, foods, dyes, or preservatives Pregnant or trying to get pregnant Breast-feeding How should I use this medication? This medication is injected under the skin. It is given by your care team in a hospital or clinic setting. Talk to your care team about the use of this medication in children. Special care may be needed. Overdosage: If you think you have taken too much of this medicine contact a poison control center or emergency room at once. NOTE: This medicine is only for you. Do not share this medicine with others. What if I miss a dose? Keep appointments for follow-up doses. It is important not to miss your dose. Call your care team if you are unable to keep an appointment. What may interact with this medication? Interactions are not expected. This list may not describe all possible interactions. Give your health care provider a list of all the medicines, herbs, non-prescription drugs, or dietary supplements you use. Also tell them if you smoke, drink alcohol, or use illegal drugs. Some items may interact with your medicine. What should I watch for while using this medication? Visit your care team for regular checks on your progress. Tell your care team if your symptoms do not start to get better or if they get worse. You may need blood work while you are taking this medication. What side effects may I notice from receiving this  medication? Side effects that you should report to your care team as soon as possible: Allergic reactions--skin rash, itching, hives, swelling of the face, lips, tongue, or throat Side effects that usually do not require medical attention (report these to your care team if they continue or are bothersome): Joint pain Pain, redness, or irritation at injection site This list may not describe all possible side effects. Call your doctor for medical advice about side effects. You may report side effects to FDA at 1-800-FDA-1088. Where should I keep my medication? This medication is given in a hospital or clinic. It will not be stored at home. NOTE: This sheet is a summary. It may not cover all possible information. If you have questions about this medicine, talk to your doctor, pharmacist, or health care provider.  2023 Elsevier/Gold Standard (2020-06-30 00:00:00)

## 2022-08-15 NOTE — Progress Notes (Signed)
Diagnosis: Hyperlipidemia  Provider:  Marshell Garfinkel MD  Procedure: Injection  Leqvio (inclisiran), Dose: 284 mg, Site: subcutaneous, Number of injections: 1  Post Care: Observation period completed  Discharge: Condition: Good, Destination: Home . AVS Provided  Performed by:  Arnoldo Morale, RN

## 2022-08-22 ENCOUNTER — Ambulatory Visit (INDEPENDENT_AMBULATORY_CARE_PROVIDER_SITE_OTHER): Payer: Medicare Other | Admitting: Internal Medicine

## 2022-08-22 ENCOUNTER — Encounter: Payer: Self-pay | Admitting: Internal Medicine

## 2022-08-22 VITALS — BP 124/70 | HR 74 | Ht 65.0 in | Wt 205.0 lb

## 2022-08-22 DIAGNOSIS — E210 Primary hyperparathyroidism: Secondary | ICD-10-CM | POA: Diagnosis not present

## 2022-08-22 LAB — BASIC METABOLIC PANEL
BUN: 15 mg/dL (ref 6–23)
CO2: 26 mEq/L (ref 19–32)
Calcium: 10.9 mg/dL — ABNORMAL HIGH (ref 8.4–10.5)
Chloride: 109 mEq/L (ref 96–112)
Creatinine, Ser: 0.69 mg/dL (ref 0.40–1.20)
GFR: 84.06 mL/min (ref >60.00)
Glucose, Bld: 95 mg/dL (ref 70–99)
Potassium: 3.9 mEq/L (ref 3.5–5.1)
Sodium: 143 mEq/L (ref 135–145)

## 2022-08-22 LAB — VITAMIN D 25 HYDROXY (VIT D DEFICIENCY, FRACTURES): VITD: 35.42 ng/mL (ref 30.00–100.00)

## 2022-08-22 LAB — ALBUMIN: Albumin: 3.8 g/dL (ref 3.5–5.2)

## 2022-08-22 NOTE — Progress Notes (Unsigned)
Name: Christy Young  MRN/ DOB: [id number], [birth year]    Age/ Sex: 77 y.o., female     PCP: Burnis Medin, MD   Reason for Endocrinology Evaluation: Hyperparathyroidism     Initial Endocrinology Clinic Visit: 02/07/2018    PATIENT IDENTIFIER: Christy Young is a 77 y.o., female with a past medical history of Hx of breast CA. She has followed with [facility] since 02/07/2018 for consultative assistance with management of her hyperparathyroidism.   HISTORICAL SUMMARY: The patient was first diagnosed with high parathyroidism since 2018.  With a serum calcium max level of 11.4 MG/DL in 2023, and a max level of PTH at 196 PG/mL   DXA normal in December 2021 She had followed up with Dr. Loanne Drilling from 2019 until 2023   24-hour urinary calcium normal at 225 mg 02/2022  SUBJECTIVE:    Today (08/22/2022):  [name] is here for follow-up on hyperparathyroidism  She has stopped MVI  Denies renal stones  Denies polydipsia  Continues with frequency , which limits hydration  Denies constipation or recent falls  Has rare  edema  Statin treatment caused joint aches/pains , she started new lipid lowering agent  Has fatigue   Vitamin D 5000 iu every other day - she takes it daily    HISTORY:  Past Medical History:  Past Medical History:  Diagnosis Date   ARTHRITIS    Breast cancer ([place])    Carpal tunnel syndrome 08/23/2015   Left   Exertional dyspnea    Fatigue    Gout, unspecified    Hyperlipidemia    Hypertension    meds since age 70    Hyperuricemia    Nonspecific abnormal electrocardiogram (ECG) (EKG)    t wave non acute     OBESITY    Palpitations    hospitalized 2005 felt from stress neg cards eval   Primary hyperparathyroidism ([place])    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 She has never used smokeless tobacco. She reports current alcohol use of about 3.0 standard drinks of alcohol per week. She reports that she does not use drugs. Family History:  Family History  Problem Relation Age of Onset   Hypertension Mother    Arthritis Mother    Alcohol abuse Father        deceased   Diabetes Father    Lymphoma Sister        non hodgkins   Heart attack Brother    Lung cancer Brother    Lung cancer Brother    Prostate cancer Brother        dx after 17   Lung cancer Brother    Heart attack Son 15       1999   Hyperparathyroidism Neg Hx      HOME MEDICATIONS: Allergies as of 08/22/2022       Reactions   Pravastatin Other (See  Comments)   Body MS aches and pain s   Rosuvastatin    REACTION: leg cramps.   Penicillins Other (See Comments)   Patient  says she is not allergic to amoxicillin or augmentin   Has taken these wo problem ? Of SE when she was 19  Not sever         Medication List        Accurate as of August 22, 2022 11:02 AM. If you have any questions, ask your nurse or doctor.          allopurinol 100 MG tablet Commonly known as: ZYLOPRIM TAKE 2 TABLETS BY MOUTH EVERY DAY, SCHEDULE APPOINTMENT FOR REFILLS What changed: See the new instructions.   nebivolol 5 MG tablet Commonly known as: BYSTOLIC TAKE 1 TABLET DAILY   tamoxifen 20 MG tablet Commonly known as: NOLVADEX TAKE 1 TABLET BY MOUTH EVERY DAY   Vitamin D3 125 MCG (5000 UT) capsule Generic drug: Cholecalciferol Take 1 capsule (5,000 Units total) by mouth daily.          OBJECTIVE:   PHYSICAL  EXAM: VS: BP 124/70 (BP Location: Left Arm, Patient Position: Sitting, Cuff Size: Large)   Pulse 74   Ht '5\' 5"'$  (1.651 m)   Wt 205 lb (93 kg)   SpO2 98%   BMI 34.11 kg/m    EXAM: General: Pt appears well and is in NAD  Hydration: Well-hydrated with moist mucous membranes and good skin turgor  Eyes: External eye exam normal without stare, lid lag or exophthalmos.  EOM intact.  PERRL.  Ears, Nose, Throat: Hearing: Grossly intact bilaterally Dental: Good dentition  Throat: Clear without mass, erythema or exudate  Neck: General: Supple without adenopathy. Thyroid: Thyroid size normal.  No goiter or nodules appreciated. No thyroid bruit.  Lungs: Clear with good BS bilat with no rales, rhonchi, or wheezes  Heart: Auscultation: RRR.  Abdomen: Normoactive bowel sounds, soft, nontender, without masses or organomegaly palpable  Extremities:  BL LE: No pretibial edema normal ROM and strength.  Mental Status: Judgment, insight: Intact Orientation: Oriented to time, place, and person Mood and affect: No depression, anxiety, or agitation     DATA REVIEWED:   Latest Reference Range & Units 08/22/22 11:20  Sodium 135 - 145 mEq/L 143  Potassium 3.5 - 5.1 mEq/L 3.9  Chloride 96 - 112 mEq/L 109  CO2 19 - 32 mEq/L 26  Glucose 70 - 99 mg/dL 95  BUN 6 - 23 mg/dL 15  Creatinine 0.40 - 1.20 mg/dL 0.69  Calcium 8.4 - 10.5 mg/dL 10.9 (H)  Albumin 3.5 - 5.2 g/dL 3.8  GFR >60.00 mL/min 84.06    Latest Reference Range & Units 08/22/22 11:20  VITD 30.00 - 100.00 ng/mL 35.42    Latest Reference Range & Units 08/22/22 11:20  PTH, Intact 16 - 77 pg/mL 112 (H)     ASSESSMENT / PLAN / RECOMMENDATIONS:   Primary hyperparathyroidism:  -Serum calcium remains elevated but stable, it has trended down since discontinuation of multivitamin -DXA normal 05/2020 - 24-hour urinary excretion of calcium normal 225 mg 02/2022 -No indication for surgical intervention at this time   Medications  Stay  hydrated Maintain 2-3 servings of dietary calcium daily Continue vitamin D3 5000 IU     Follow-up in 6 months   Signed electronically by: Mack Guise, MD  [facility]  [address] [place], [place] [address] Phone: [phone] FAX: [phone]  CC: [name], Standley Brooking, MD [place] [address] Phone: [phone]  Fax: [phone]   Return to Endocrinology clinic as below: Future Appointments  Date Time Provider [place]  09/04/2022 10:10 AM Collie Siad A, PTA [facility] None  10/26/2022 10:00 AM [facility] None  10/26/2022 10:30 AM Alla Feeling, NP [facility] None  11/13/2022  9:15 AM [facility] None  01/03/2023  1:00 PM Patwardhan, Reynold Bowen, MD [facility] None  05/07/2023  3:30 PM [place] [facility]

## 2022-08-22 NOTE — Patient Instructions (Signed)
Stay hydrated  Avoid over the counter calcium tablets  Maintain 2-3 servings of calcium through the diet daily

## 2022-08-23 LAB — PARATHYROID HORMONE, INTACT (NO CA): PTH: 112 pg/mL — ABNORMAL HIGH (ref 16–77)

## 2022-08-31 DIAGNOSIS — E785 Hyperlipidemia, unspecified: Secondary | ICD-10-CM | POA: Diagnosis not present

## 2022-09-04 ENCOUNTER — Ambulatory Visit: Payer: Medicare Other | Attending: Surgery

## 2022-09-04 VITALS — Wt 206.0 lb

## 2022-09-04 DIAGNOSIS — Z483 Aftercare following surgery for neoplasm: Secondary | ICD-10-CM | POA: Insufficient documentation

## 2022-09-04 NOTE — Therapy (Signed)
OUTPATIENT PHYSICAL THERAPY SOZO SCREENING NOTE   Patient Name: Christy Young MRN: [id number] DOB:January 24, 1946, 77 y.o., female Today's Date: 09/04/2022  PCP: Burnis Medin, MD REFERRING PROVIDER: Donnie Mesa, MD   PT End of Session - 09/04/22 1026     Visit Number 2   # unchanged due to screen only   PT Start Time 1024    PT Stop Time 1028    PT Time Calculation (min) 4 min    Activity Tolerance Patient tolerated treatment well    Behavior During Therapy WFL for tasks assessed/performed             Past Medical History:  Diagnosis Date   ARTHRITIS    Breast cancer ([place])    Carpal tunnel syndrome 08/23/2015   Left   Exertional dyspnea    Fatigue    Gout, unspecified    Hyperlipidemia    Hypertension    meds since age 9    Hyperuricemia    Nonspecific abnormal electrocardiogram (ECG) (EKG)    t wave non acute     OBESITY    Palpitations    hospitalized 2005 felt from stress neg cards eval   Primary hyperparathyroidism ([place])    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   Past Surgical History:  Procedure Laterality Date   ABDOMINAL HYSTERECTOMY     BREAST BIOPSY     right side   BREAST LUMPECTOMY WITH RADIOACTIVE SEED AND SENTINEL LYMPH NODE BIOPSY Left 06/07/2021   Procedure: LEFT BREAST LUMPECTOMY WITH RADIOACTIVE SEED X2 AND AXILLARY SENTINEL LYMPH NODE BIOPSY;  Surgeon: Donnie Mesa, MD;  Location: [facility];  Service: General;  Laterality: Left;   [place]   Patient Active Problem List   Diagnosis Date Noted   Statin myopathy 06/29/2022   Malignant neoplasm of upper-outer quadrant of left breast in female, estrogen receptor positive ([place]) 05/30/2021   Pain of left lower extremity 03/16/2021   Exertional dyspnea 03/08/2021   Decreased exercise tolerance 03/08/2021   Hyperparathyroidism ([place]) 02/09/2018   Abnormal prominence of clavicle 02/07/2018   Psoriasis 10/23/2017   Carpal tunnel  syndrome 08/23/2015   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   H/O: gout 08/08/2015   Elevated blood sugar 08/08/2015   Essential hypertension 07/09/2014   Mixed hyperlipidemia 07/09/2014   Body aches 07/09/2014   Urinary frequency 07/09/2014   Numbness and tingling in left hand 07/09/2014   Visit for preventive health examination 04/11/2013   Obesity (BMI 30-39.9) 04/11/2013   Statin intolerance 04/11/2013   At risk for coronary artery disease 05/15/2012   Family history of premature coronary heart disease 04/23/2012   Fatigue 12/29/2010   Nonspecific abnormal electrocardiogram (ECG) (EKG) 12/29/2010   Hyperuricemia 12/29/2010   Palpitations    OBESITY 01/10/2010   HYPERLIPIDEMIA 11/09/2009   Gout 11/09/2009   HYPERTENSION 11/09/2009   ARTHRITIS 11/09/2009    REFERRING DIAG: left breast cancer at risk for lymphedema  THERAPY DIAG: Aftercare following surgery for neoplasm  PERTINENT HISTORY: Patient underwent a left lumpectomy and sentinel node biopsy (1 negative node) on 06/07/2021. It is ER/PR positive and HER2 negative with a Ki67 of 40%.   PRECAUTIONS: left UE Lymphedema risk, None  SUBJECTIVE: Pt returns for her 3 month L-Dex screen.   PAIN:  Are you having pain? No  SOZO SCREENING: Patient was assessed today using the SOZO machine to determine the lymphedema index score. This was  compared to her baseline score. It was determined that she is back within the recommended range when compared to her baseline and no further action is needed at this time, though did suggest she cont to wewar her compression sleeve when increasing her HR with activities, I.e. walking. Also when she flies to [facility] for upcoming trip. She will continue SOZO screenings. These are done every 3 months for 2 years post operatively followed by every 6 months for 2 years, and then annually.   L-DEX FLOWSHEETS - 09/04/22 1000       L-DEX LYMPHEDEMA SCREENING   Measurement  Type Unilateral    L-DEX MEASUREMENT EXTREMITY Upper Extremity    POSITION  Standing    DOMINANT SIDE Right    At Risk Side Left    BASELINE SCORE (UNILATERAL) 2.9    L-DEX SCORE (UNILATERAL) 4.6    VALUE CHANGE (UNILAT) 1.7               Christy Young, PTA 09/04/2022, 10:27 AM

## 2022-09-26 ENCOUNTER — Other Ambulatory Visit: Payer: Self-pay | Admitting: Internal Medicine

## 2022-10-25 ENCOUNTER — Other Ambulatory Visit: Payer: Self-pay

## 2022-10-25 DIAGNOSIS — C50412 Malignant neoplasm of upper-outer quadrant of left female breast: Secondary | ICD-10-CM

## 2022-10-25 NOTE — Progress Notes (Signed)
Patient Care Team: [name], Neta Mends, MD as PCP - General (Internal Medicine) Rossie Muskrat, MD as Referring Physician (Rheumatology) Manus Rudd, MD as Consulting Physician (General Surgery) Malachy Mood, MD as Consulting Physician (Hematology) Lonie Peak, MD as Attending Physician (Radiation Oncology) Tyrell Antonio, MD as Consulting Physician (Physical Medicine and Rehabilitation) Elder Negus, MD as Consulting Physician (Cardiology) [facility], Konrad Dolores, MD as Attending Physician (Endocrinology)   CHIEF COMPLAINT: Follow-up left breast cancer  Oncology History Overview Note   Cancer Staging  Malignant neoplasm of upper-outer quadrant of left breast in female, estrogen receptor positive [facility]) Staging form: Breast, AJCC 8th Edition - Clinical stage from 06/01/2021: Stage IIA (cT2, cN0, cM0, G3, ER+, PR+, HER2-) - Unsigned      Malignant neoplasm of upper-outer quadrant of left breast in female, estrogen receptor positive (HCC)  05/02/2021 Mammogram   Exam: 3D Mammogram Diagnostic Bilateral IMPRESSION: Palpable 2.4 cm irregular high density mass in the upper-outer left breast.  Exam: Breast Ultrasound - Left IMPRESSION: 1. 2.3 cm irregular mass in left breast at 1 o'clock posterior depth, 13 mc from nipple. 2. 0.9 cm irregular mass in left breast at 1 o'clock posterior depth, 10 cm from nipple 3. Three small 3-4 mm round masses favored to represent small cysts seen at 2:30-3:00 11-12 cm from nipple. 4. No abnormal nodes in left axilla.   05/23/2021 Initial Biopsy   Diagnosis 1. Breast, left, needle core biopsy, breast mass, 2.3 cm @ 1:00 13 CMFN  - INVASIVE DUCTAL CARCINOMA, grade 3  2. Breast, left, needle core biopsy, breast mass, 0.9 cm @ 1:00 10 CMFN  - INVASIVE DUCTAL CARCINOMA, grade 1  1. PROGNOSTIC INDICATORS Results: The tumor cells are EQUIVOCAL for Her2 (2+) Estrogen Receptor: 100%, POSITIVE, STRONG STAINING INTENSITY Progesterone  Receptor: 60%, POSITIVE, STRONG STAINING INTENSITY Proliferation Marker Ki67: 40%  1. FLUORESCENCE IN-SITU HYBRIDIZATION Results: GROUP 5: HER2 **NEGATIVE**  2. PROGNOSTIC INDICATORS Results: The tumor cells are NEGATIVE for Her2 (1+) Estrogen Receptor: 100%, POSITIVE, STRONG STAINING INTENSITY Progesterone Receptor: 100%, POSITIVE, STRONG STAINING INTENSITY Proliferation Marker Ki67: 10%   05/30/2021 Initial Diagnosis   Malignant neoplasm of upper-outer quadrant of left breast in female, estrogen receptor positive (HCC)   06/07/2021 Definitive Surgery   FINAL MICROSCOPIC DIAGNOSIS:   A. BREAST, LEFT, LUMPECTOMY:  Invasive ductal carcinomas (two independent lesions, 2.5 cm and 0.8 cm) with clear margins of resection.  Please see the synoptic report after specimen B.   B. LYMPH NODE, LEFT AXILLARY #1, SENTINEL, EXCISION:  Hyperplastic lymph node with fatty infiltration which is negative for  metastatic carcinoma.    06/07/2021 Oncotype testing   Oncotype DX was obtained on the final surgical sample and the recurrence score of 28 predicts a risk of recurrence outside the breast over the next 9 years of 18%, if the patient's only systemic therapy is an antiestrogen for 5 years.  It also predicts a significant benefit from chemotherapy.    06/07/2021 Cancer Staging   Staging form: Breast, AJCC 8th Edition - Pathologic stage from 06/07/2021: Stage IB (pT2, pN0, cM0, G3, ER+, PR+, HER2-, Oncotype DX score: 29) - Signed by Malachy Mood, MD on 08/05/2021 Stage prefix: Initial diagnosis Multigene prognostic tests performed: Oncotype DX Recurrence score range: Greater than or equal to 11 Histologic grading system: 3 grade system   07/15/2021 - 09/17/2021 Chemotherapy   Patient is on Treatment Plan : BREAST TC q21d     10/10/2021 - 11/04/2021 Radiation Therapy   Site Technique  Total Dose (Gy) Dose per Fx (Gy) Completed Fx Beam Energies  Breast, Left: Breast_L 3D 40.05/40.05 2.67 15/15 10X   Breast, Left: Breast_L_Bst 3D 10/10 2 5/5 6X, 10X     11/2021 -  Anti-estrogen oral therapy   Tamoxifen      CURRENT THERAPY: Tamoxifen, starting 11/2021  INTERVAL HISTORY Ms. Oliveto returns for follow-up as scheduled, last seen by Dr. Mosetta Putt 04/26/2022.  Mammogram 06/07/2022 was negative.  She continues tamoxifen, tolerating well except worsening incontinence and leakage. She goes to BR q1-2 hours even at night. She has tried kegels on her own which has not helped much. She is asking for referral. Denies signs of infection such as dysuria, hematuria, fever/chills. Denies vaginal bleeding. Chronic back and body pains are stable on tamoxifen. Has some headache and allergy symptoms, nagging cough at night and sleeps on pillows. She has fatigue, trying to walk more. She attributes to surgery, chemo, and radiation. Breast remains dark but no concerning changes.   ROS  All other systems reviewed and negative  Past Medical History:  Diagnosis Date   ARTHRITIS    Breast cancer (HCC)    Carpal tunnel syndrome 08/23/2015   Left   Exertional dyspnea    Fatigue    Gout, unspecified    Hyperlipidemia    Hypertension    meds since age 75    Hyperuricemia    Nonspecific abnormal electrocardiogram (ECG) (EKG)    t wave non acute     OBESITY    Palpitations    hospitalized 2005 felt from stress neg cards eval   Primary hyperparathyroidism (HCC)    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     Past Surgical History:  Procedure Laterality Date   ABDOMINAL HYSTERECTOMY     BREAST BIOPSY     right side   BREAST LUMPECTOMY WITH RADIOACTIVE SEED AND SENTINEL LYMPH NODE BIOPSY Left 06/07/2021   Procedure: LEFT BREAST LUMPECTOMY WITH RADIOACTIVE SEED X2 AND AXILLARY SENTINEL LYMPH NODE BIOPSY;  Surgeon: Manus Rudd, MD;  Location: [facility];  Service: General;  Laterality: Left;   COLONOSCOPY     FACIAL COSMETIC SURGERY  1980     Outpatient Encounter Medications as of 10/26/2022   Medication Sig   allopurinol (ZYLOPRIM) 100 MG tablet TAKE 2 TABLETS BY MOUTH EVERY DAY, SCHEDULE APPOINTMENT FOR REFILLS (Patient taking differently: Take 100 mg by mouth daily.)   Cholecalciferol (VITAMIN D3) 125 MCG (5000 UT) CAPS Take 1 capsule (5,000 Units total) by mouth daily.   nebivolol (BYSTOLIC) 5 MG tablet TAKE 1 TABLET DAILY   tamoxifen (NOLVADEX) 20 MG tablet TAKE 1 TABLET BY MOUTH EVERY DAY   No facility-administered encounter medications on file as of 10/26/2022.     Today's Vitals   10/26/22 1028 10/26/22 1030  BP: 133/76   Pulse: 72   Resp: 17   Temp: 98.4 F (36.9 C)   TempSrc: Oral   SpO2: 99%   Weight: 205 lb 3.2 oz (93.1 kg)   PainSc:  7    Body mass index is 34.15 kg/m.   PHYSICAL EXAM GENERAL:alert, no distress and comfortable SKIN: no rash  EYES: sclera clear NECK: without mass LYMPH:  no palpable cervical or supraclavicular lymphadenopathy  LUNGS: clear with normal breathing effort HEART: regular rate & rhythm, no lower extremity edema ABDOMEN: abdomen soft, non-tender and normal bowel sounds NEURO: alert & oriented x 3 with fluent speech, no focal motor/sensory deficits MSK: no ttp Breast exam: no nipple  discharge or inversion. S/p left lumpectomy and radiation, incisions completely healed. Hyperpigmentation over the breast. No palpable mass or nodularity in either breast or axilla that I could appreciate     CBC    Component Value Date/Time   WBC 5.0 10/26/2022 1011   WBC 5.6 04/10/2022 1052   RBC 4.22 10/26/2022 1011   HGB 12.9 10/26/2022 1011   HCT 37.8 10/26/2022 1011   PLT 230 10/26/2022 1011   MCV 89.6 10/26/2022 1011   MCH 30.6 10/26/2022 1011   MCHC 34.1 10/26/2022 1011   RDW 12.8 10/26/2022 1011   LYMPHSABS 1.7 10/26/2022 1011   MONOABS 0.5 10/26/2022 1011   EOSABS 0.1 10/26/2022 1011   BASOSABS 0.1 10/26/2022 1011     CMP     Component Value Date/Time   NA 142 10/26/2022 1011   K 4.0 10/26/2022 1011   CL 109 10/26/2022  1011   CO2 28 10/26/2022 1011   GLUCOSE 104 (H) 10/26/2022 1011   BUN 15 10/26/2022 1011   CREATININE 0.73 10/26/2022 1011   CREATININE 0.66 02/18/2020 1050   CALCIUM 10.3 10/26/2022 1011   PROT 6.3 (L) 10/26/2022 1011   ALBUMIN 4.0 10/26/2022 1011   AST 13 (L) 10/26/2022 1011   ALT 12 10/26/2022 1011   ALKPHOS 47 10/26/2022 1011   BILITOT 0.6 10/26/2022 1011   GFRNONAA >60 10/26/2022 1011     ASSESSMENT & PLAN:Aleighya D Mongillo is a 77 y.o. post-hysterectomy female with    1. 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 Corliss Skains showed grade 3 IDC to both tumors (2.5 cm and 0.8 cm) with small foci of DCIS. Margins and lymph node were negative. -Oncotype Dx recurrence score of 29, high risk -s/p 4 cycles adjuvant TC 07/15/21 - 09/15/21. She tolerated fairly with significant side effects. -s/p radiation under Dr. Basilio Cairo, 10/10/21 - 11/04/21. -she began tamoxifen in 11/2021, tolerating moderately well except worsening incontinence/leakage with no signs of UTI.  Kegels have not been effective.  I referred her to urogynecology today per her request -Most recent mammogram 05/2022 was negative -Ms. Metter is clinically doing well.  Breast exam is benign, labs are unremarkable.  Overall no clinical concern for recurrence. -Continue breast cancer surveillance and tamoxifen -Next mammogram and DEXA in 05/2023 -Follow-up in 6 months, or sooner if needed  2. Bone Health, Hypercalcemia -Her most recent DEXA was 06/08/20 showing -0.8 (normal) -she has a history of elevated calcium, ranging between 10-12. -Followed by PCP and endocrinology Dr. Lonzo Cloud    PLAN: -Recent mammogram and today's labs reviewed -Continue breast cancer surveillance and Tamoxifen -Referral to urogyn for incontinence likely secondary to vaginal atrophy from anti-estrogen  therapy -Mammo and DEXA in 05/2023 -F/up in 6 months, or sooner if needed   Orders Placed This Encounter  Procedures   MM DIAG BREAST TOMO BILATERAL    Standing Status:   Future    Standing Expiration Date:   10/26/2023    Scheduling Instructions:     [facility]    Order Specific Question:   Reason for Exam (SYMPTOM  OR DIAGNOSIS REQUIRED)    Answer:   L breast cancer 04/2021 s/p lumpectomy, chemo, RT    Order Specific Question:   Preferred imaging location?    Answer:   External   DG Bone Density    Standing Status:   Future    Standing  Expiration Date:   10/26/2023    Scheduling Instructions:     [facility]    Order Specific Question:   Reason for Exam (SYMPTOM  OR DIAGNOSIS REQUIRED)    Answer:   screening, normal DEXA 2021. on tamoxifen    Order Specific Question:   Preferred imaging location?    Answer:   External   Ambulatory referral to Urogynecology    Referral Priority:   Routine    Referral Type:   Consultation    Referral Reason:   Specialty Services Required    Requested Specialty:   Urology    Number of Visits Requested:   1      All questions were answered. The patient knows to call the clinic with any problems, questions or concerns. No barriers to learning were detected.  Santiago Glad, NP-C 10/26/2022

## 2022-10-26 ENCOUNTER — Inpatient Hospital Stay: Payer: Medicare Other | Attending: Nurse Practitioner

## 2022-10-26 ENCOUNTER — Inpatient Hospital Stay (HOSPITAL_BASED_OUTPATIENT_CLINIC_OR_DEPARTMENT_OTHER): Payer: Medicare Other | Admitting: Nurse Practitioner

## 2022-10-26 ENCOUNTER — Encounter: Payer: Self-pay | Admitting: Nurse Practitioner

## 2022-10-26 VITALS — BP 133/76 | HR 72 | Temp 98.4°F | Resp 17 | Wt 205.2 lb

## 2022-10-26 DIAGNOSIS — Z79810 Long term (current) use of selective estrogen receptor modulators (SERMs): Secondary | ICD-10-CM | POA: Insufficient documentation

## 2022-10-26 DIAGNOSIS — Z170 Estrogen receptor positive status [ER+]: Secondary | ICD-10-CM | POA: Insufficient documentation

## 2022-10-26 DIAGNOSIS — C50412 Malignant neoplasm of upper-outer quadrant of left female breast: Secondary | ICD-10-CM

## 2022-10-26 DIAGNOSIS — E2839 Other primary ovarian failure: Secondary | ICD-10-CM | POA: Diagnosis not present

## 2022-10-26 LAB — CBC WITH DIFFERENTIAL (CANCER CENTER ONLY)
Abs Immature Granulocytes: 0.01 10*3/uL (ref 0.00–0.07)
Basophils Absolute: 0.1 10*3/uL (ref 0.0–0.1)
Basophils Relative: 1 %
Eosinophils Absolute: 0.1 10*3/uL (ref 0.0–0.5)
Eosinophils Relative: 3 %
HCT: 37.8 % (ref 36.0–46.0)
Hemoglobin: 12.9 g/dL (ref 12.0–15.0)
Immature Granulocytes: 0 %
Lymphocytes Relative: 34 %
Lymphs Abs: 1.7 10*3/uL (ref 0.7–4.0)
MCH: 30.6 pg (ref 26.0–34.0)
MCHC: 34.1 g/dL (ref 30.0–36.0)
MCV: 89.6 fL (ref 80.0–100.0)
Monocytes Absolute: 0.5 10*3/uL (ref 0.1–1.0)
Monocytes Relative: 9 %
Neutro Abs: 2.6 10*3/uL (ref 1.7–7.7)
Neutrophils Relative %: 53 %
Platelet Count: 230 10*3/uL (ref 150–400)
RBC: 4.22 MIL/uL (ref 3.87–5.11)
RDW: 12.8 % (ref 11.5–15.5)
WBC Count: 5 10*3/uL (ref 4.0–10.5)
nRBC: 0 % (ref 0.0–0.2)

## 2022-10-26 LAB — CMP (CANCER CENTER ONLY)
ALT: 12 U/L (ref 0–44)
AST: 13 U/L — ABNORMAL LOW (ref 15–41)
Albumin: 4 g/dL (ref 3.5–5.0)
Alkaline Phosphatase: 47 U/L (ref 38–126)
Anion gap: 5 (ref 5–15)
BUN: 15 mg/dL (ref 8–23)
CO2: 28 mmol/L (ref 22–32)
Calcium: 10.3 mg/dL (ref 8.9–10.3)
Chloride: 109 mmol/L (ref 98–111)
Creatinine: 0.73 mg/dL (ref 0.44–1.00)
GFR, Estimated: >60 mL/min (ref >60)
Glucose, Bld: 104 mg/dL — ABNORMAL HIGH (ref 70–99)
Potassium: 4 mmol/L (ref 3.5–5.1)
Sodium: 142 mmol/L (ref 135–145)
Total Bilirubin: 0.6 mg/dL (ref 0.3–1.2)
Total Protein: 6.3 g/dL — ABNORMAL LOW (ref 6.5–8.1)

## 2022-11-13 ENCOUNTER — Ambulatory Visit (INDEPENDENT_AMBULATORY_CARE_PROVIDER_SITE_OTHER): Payer: Medicare Other

## 2022-11-13 VITALS — BP 122/80 | HR 79 | Temp 97.8°F | Resp 18 | Ht 65.0 in | Wt 204.8 lb

## 2022-11-13 DIAGNOSIS — E782 Mixed hyperlipidemia: Secondary | ICD-10-CM

## 2022-11-13 MED ORDER — INCLISIRAN SODIUM 284 MG/1.5ML ~~LOC~~ SOSY
284.0000 mg | PREFILLED_SYRINGE | Freq: Once | SUBCUTANEOUS | Status: AC
  Administered 2022-11-13: 284 mg via SUBCUTANEOUS
  Filled 2022-11-13: qty 1.5

## 2022-11-13 NOTE — Progress Notes (Signed)
Diagnosis: Hyperlipidemia  Provider:  Chilton Greathouse MD  Procedure: IV Infusion  Leqvio (inclisiran), Dose: 284 mg, Site: subcutaneous, Number of injections: 1  Post Care: Patient declined observation  Discharge: Condition: Good, Destination: Home . AVS Declined  Performed by:  Adriana Mccallum, RN

## 2022-12-11 ENCOUNTER — Ambulatory Visit: Payer: Medicare Other | Attending: Surgery

## 2022-12-11 VITALS — Wt 203.0 lb

## 2022-12-11 DIAGNOSIS — Z483 Aftercare following surgery for neoplasm: Secondary | ICD-10-CM | POA: Insufficient documentation

## 2022-12-11 NOTE — Therapy (Signed)
OUTPATIENT PHYSICAL THERAPY SOZO SCREENING NOTE   Patient Name: RAINA Young MRN: [id number] DOB:1945/08/01, 77 y.o., female Today's Date: 12/11/2022  PCP: Madelin Headings, MD REFERRING PROVIDER: Manus Rudd, MD   PT End of Session - 12/11/22 [phone]     Visit Number 2   # unchanged due to screen only   PT Start Time 0932    PT Stop Time 0936    PT Time Calculation (min) 4 min    Activity Tolerance Patient tolerated treatment well    Behavior During Therapy [facility] for tasks assessed/performed             Past Medical History:  Diagnosis Date   ARTHRITIS    Breast cancer (HCC)    Carpal tunnel syndrome 08/23/2015   Left   Exertional dyspnea    Fatigue    Gout, unspecified    Hyperlipidemia    Hypertension    meds since age 78    Hyperuricemia    Nonspecific abnormal electrocardiogram (ECG) (EKG)    t wave non acute     OBESITY    Palpitations    hospitalized 2005 felt from stress neg cards eval   Primary hyperparathyroidism (HCC)    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   Past Surgical History:  Procedure Laterality Date   ABDOMINAL HYSTERECTOMY     BREAST BIOPSY     right side   BREAST LUMPECTOMY WITH RADIOACTIVE SEED AND SENTINEL LYMPH NODE BIOPSY Left 06/07/2021   Procedure: LEFT BREAST LUMPECTOMY WITH RADIOACTIVE SEED X2 AND AXILLARY SENTINEL LYMPH NODE BIOPSY;  Surgeon: Manus Rudd, MD;  Location: [facility];  Service: General;  Laterality: Left;   COLONOSCOPY     FACIAL COSMETIC SURGERY  1980   Patient Active Problem List   Diagnosis Date Noted   Statin myopathy 06/29/2022   Malignant neoplasm of upper-outer quadrant of left breast in female, estrogen receptor positive (HCC) 05/30/2021   Pain of left lower extremity 03/16/2021   Exertional dyspnea 03/08/2021   Decreased exercise tolerance 03/08/2021   Hyperparathyroidism (HCC) 02/09/2018   Abnormal prominence of clavicle 02/07/2018   Psoriasis 10/23/2017   Carpal tunnel  syndrome 08/23/2015   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   H/O: gout 08/08/2015   Elevated blood sugar 08/08/2015   Essential hypertension 07/09/2014   Mixed hyperlipidemia 07/09/2014   Body aches 07/09/2014   Urinary frequency 07/09/2014   Numbness and tingling in left hand 07/09/2014   Visit for preventive health examination 04/11/2013   Obesity (BMI 30-39.9) 04/11/2013   Statin intolerance 04/11/2013   At risk for coronary artery disease 05/15/2012   Family history of premature coronary heart disease 04/23/2012   Fatigue 12/29/2010   Nonspecific abnormal electrocardiogram (ECG) (EKG) 12/29/2010   Hyperuricemia 12/29/2010   Palpitations    OBESITY 01/10/2010   HYPERLIPIDEMIA 11/09/2009   Gout 11/09/2009   HYPERTENSION 11/09/2009   ARTHRITIS 11/09/2009    REFERRING DIAG: left breast cancer at risk for lymphedema  THERAPY DIAG:  Aftercare following surgery for neoplasm  PERTINENT HISTORY: Patient underwent a left lumpectomy and sentinel node biopsy (1 negative node) on 06/07/2021. It is ER/PR positive and HER2 negative with a Ki67 of 40%.   PRECAUTIONS: left UE Lymphedema risk, None  SUBJECTIVE: I think my arm has been swelling again.   PAIN:  Are you having pain? No  SOZO SCREENING: Patient was assessed today using the SOZO machine to determine the lymphedema index score. This  was compared to her baseline score. It was determined that she is NOT within the recommended range when compared to her baseline. She has her compression sleeve already from when she was last measured with [place] so will resume wear of this. It is recommended she return in 1 month to be reassessed. If she continues to measure outside the recommended range, physical therapy treatment will be recommended at that time and a referral requested.   L-DEX FLOWSHEETS - 12/11/22 0900       L-DEX LYMPHEDEMA SCREENING   Measurement Type Unilateral    L-DEX MEASUREMENT EXTREMITY Upper  Extremity    POSITION  Standing    DOMINANT SIDE Right    At Risk Side Left    BASELINE SCORE (UNILATERAL) 2.9    L-DEX SCORE (UNILATERAL) 11.1    VALUE CHANGE (UNILAT) 8.2               Hermenia Bers, PTA 12/11/2022, 9:35 AM

## 2022-12-25 ENCOUNTER — Other Ambulatory Visit: Payer: Self-pay | Admitting: Family

## 2023-01-03 ENCOUNTER — Encounter: Payer: Self-pay | Admitting: Cardiology

## 2023-01-03 ENCOUNTER — Ambulatory Visit: Payer: Medicare Other | Admitting: Cardiology

## 2023-01-03 ENCOUNTER — Encounter: Payer: Self-pay | Admitting: Hematology

## 2023-01-03 ENCOUNTER — Other Ambulatory Visit: Payer: Self-pay | Admitting: Internal Medicine

## 2023-01-03 VITALS — BP 118/83 | HR 88 | Ht 65.0 in | Wt 198.0 lb

## 2023-01-03 DIAGNOSIS — I10 Essential (primary) hypertension: Secondary | ICD-10-CM | POA: Diagnosis not present

## 2023-01-03 DIAGNOSIS — E782 Mixed hyperlipidemia: Secondary | ICD-10-CM | POA: Diagnosis not present

## 2023-01-03 DIAGNOSIS — R0609 Other forms of dyspnea: Secondary | ICD-10-CM | POA: Diagnosis not present

## 2023-01-03 NOTE — Progress Notes (Signed)
Patient referred by Madelin Headings, MD for dyspnea on exertion, hyperlipidemia  Subjective:   Christy Young, female    DOB: 07-03-1945, 77 y.o.   MRN: [id number]   Chief Complaint  Patient presents with   Hyperlipidemia   Follow-up      HPI  77 y.o. African-American female with primary hyperparathyroidism, obesity, arthritis, mixed hyperlipidemia, s/p lumpectomy, chemoradiation for breast cancer  Patient is doing well, denies chest pain, shortness of breath, palpitations, leg edema, orthopnea, PND, TIA/syncope. She is now on Leqvio and tolerating it well.    Initial consultation HPI 02/2021: Patient is retired, lives fairly sedentary lifestyle.  Her activity has been limited due to multiple complaints appointments for about a year or so.  Recently, she will experience dyspnea on exertion with minimal activity.  She denies any orthopnea, PND, regular symptoms.  Also denies any chest pain.  She has had hyperlipidemia for several years.  In the past, she was on Lipitor, which was stopped due to myalgias.  She was then started on pravastatin, but patient admits to not taking it regularly.  Currently, she is not on lipid-lowering therapy.  On a separate note, patient complains of pain in the left leg for several years.  She is very concerned about a clot in this leg.   Current Outpatient Medications:    Cholecalciferol (VITAMIN D3) 125 MCG (5000 UT) CAPS, Take 1 capsule (5,000 Units total) by mouth daily., Disp: 100 capsule, Rfl: 3   furosemide (LASIX) 20 MG tablet, Take 20 mg by mouth as needed for fluid or edema., Disp: , Rfl:    nebivolol (BYSTOLIC) 5 MG tablet, TAKE 1 TABLET DAILY, Disp: 90 tablet, Rfl: 0   tamoxifen (NOLVADEX) 20 MG tablet, TAKE 1 TABLET BY MOUTH EVERY DAY, Disp: 90 tablet, Rfl: 1   allopurinol (ZYLOPRIM) 100 MG tablet, TAKE 2 TABLETS BY MOUTH EVERY DAY, SCHEDULE APPOINTMENT FOR REFILLS (Patient not taking: Reported on 01/03/2023), Disp: 180 tablet, Rfl:  1  Cardiovascular and other pertinent studies:  EKG 06/29/2022: Sinus rhythm 83 bpm  Left axis deviation Nonspecific T-abnormality  Echocardiogram 12/08/2021:  Left ventricle cavity is normal in size. Moderate concentric hypertrophy  of the left ventricle. Normal global wall motion. Normal LV systolic  function with EF 60%. Doppler evidence of grade I (impaired) diastolic  dysfunction, normal LAP.  Structurally normal trileaflet aortic valve.  Mild (Grade I) aortic  regurgitation.  Structurally normal mitral valve.  Mild (Grade I) mitral regurgitation.  Mild tricuspid regurgitation. Estimated pulmonary artery systolic pressure  40 mmHg  Mild pulmonic regurgitation.  Previous study on 04/01/2021 reported severe asymmetric hypertrophy, mod  AI, mild to mod MR & TR, estimated PASP 31 mmHg.  Lower Extremity Venous Duplex (left)  04/01/2021:  No evidence of deep vein thrombosis of the left lower extremity with  normal venous return. Right CFV not imaged.  Recent labs: 10/26/2022: Glucose 104, BUN/Cr 15/0.73. EGFR >60. Na/K 142/4.0. Protein 6.3. Rest of the CMP normal H/H 12/37. MCV 89. Platelets 230  06/28/2022: Chol 212, TG 134, HDL 50, LDL 138   02/16/2021: Glucose 89, BUN/Cr 16/0.79. EGFR 73. Na/K 143/3.9. Rest of the CMP normal H/H 13/40. MCV 90. Platelets 285 HbA1C 5.9% Chol 289, TG 326, HDL 57, LDL 193 TSH 0.34 low, free T4 0.61 normal    Review of Systems  Cardiovascular:  Negative for chest pain, dyspnea on exertion, leg swelling, palpitations and syncope.  Musculoskeletal:        Left calf  pain          Vitals:   01/03/23 1246  BP: 118/83  Pulse: 88  SpO2: 95%     Body mass index is 32.95 kg/m. Filed Weights   01/03/23 1246  Weight: 198 lb (89.8 kg)     Objective:   Physical Exam Vitals and nursing note reviewed.  Constitutional:      General: She is not in acute distress.    Appearance: She is obese.  Neck:     Vascular: No JVD.   Cardiovascular:     Rate and Rhythm: Normal rate and regular rhythm.     Pulses: Normal pulses.     Heart sounds: Normal heart sounds. No murmur heard. Pulmonary:     Effort: Pulmonary effort is normal.     Breath sounds: Normal breath sounds. No wheezing or rales.  Musculoskeletal:     Right lower leg: No edema.     Left lower leg: No edema.          Assessment & Recommendations:   77 y.o. African-American female with primary hyperparathyroidism, obesity, arthritis, mixed hyperlipidemia, s/p lumpectomy, chemoradiation for breast cancer  Dyspnea on exertion: Resolved. 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  Mixed hyperlipidemia:  Could not tolerate Lipitor or daily Crestor due to myalgias. Chol 289, TG 326, HDL 57, LDL 193 (01/2021) Chol 212, TG 134, HDL 50, LDL 138 (06/2022) In addition to continued diet and lifestlye modification, conitnue Leqvio once every 6 months Check lipid panel.  Hypertension: Controlled  F/u in 1 year    Elder Negus, MD Pager: [phone] Office: [phone]

## 2023-01-04 LAB — LIPID PANEL
Chol/HDL Ratio: 2.4 ratio (ref 0.0–4.4)
Cholesterol, Total: 129 mg/dL (ref 100–199)
HDL: 54 mg/dL (ref >39)
LDL Chol Calc (NIH): 55 mg/dL (ref 0–99)
Triglycerides: 114 mg/dL (ref 0–149)
VLDL Cholesterol Cal: 20 mg/dL (ref 5–40)

## 2023-01-05 MED ORDER — NEBIVOLOL HCL 5 MG PO TABS: 5.0000 mg | ORAL_TABLET | Freq: Every day | ORAL | 0 refills | Status: DC

## 2023-01-11 ENCOUNTER — Ambulatory Visit (INDEPENDENT_AMBULATORY_CARE_PROVIDER_SITE_OTHER): Payer: Medicare Other | Admitting: Obstetrics and Gynecology

## 2023-01-11 ENCOUNTER — Encounter: Payer: Self-pay | Admitting: Obstetrics and Gynecology

## 2023-01-11 VITALS — BP 122/74 | HR 79 | Ht 61.8 in | Wt 204.0 lb

## 2023-01-11 DIAGNOSIS — N993 Prolapse of vaginal vault after hysterectomy: Secondary | ICD-10-CM

## 2023-01-11 DIAGNOSIS — R339 Retention of urine, unspecified: Secondary | ICD-10-CM

## 2023-01-11 DIAGNOSIS — R350 Frequency of micturition: Secondary | ICD-10-CM

## 2023-01-11 DIAGNOSIS — N8110 Cystocele, unspecified: Secondary | ICD-10-CM

## 2023-01-11 DIAGNOSIS — N952 Postmenopausal atrophic vaginitis: Secondary | ICD-10-CM | POA: Diagnosis not present

## 2023-01-11 DIAGNOSIS — N3281 Overactive bladder: Secondary | ICD-10-CM

## 2023-01-11 LAB — POCT URINALYSIS DIPSTICK
Bilirubin, UA: NEGATIVE
Blood, UA: NEGATIVE
Glucose, UA: NEGATIVE
Ketones, UA: NEGATIVE
Leukocytes, UA: NEGATIVE
Nitrite, UA: NEGATIVE
Protein, UA: NEGATIVE
Spec Grav, UA: >=1.03 — AB (ref 1.010–1.025)
Urobilinogen, UA: 0.2 E.U./dL
pH, UA: 5.5 (ref 5.0–8.0)

## 2023-01-11 MED ORDER — TROSPIUM CHLORIDE ER 60 MG PO CP24
60.0000 mg | ORAL_CAPSULE | Freq: Every day | ORAL | 5 refills | Status: DC
Start: 2023-01-11 — End: 2023-05-10

## 2023-01-11 MED ORDER — ESTRADIOL 0.1 MG/GM VA CREA: 0.5000 g | TOPICAL_CREAM | VAGINAL | 11 refills | Status: DC

## 2023-01-11 NOTE — Patient Instructions (Addendum)
Today we talked about ways to manage bladder urgency such as altering your diet to avoid irritative beverages and foods (bladder diet) as well as attempting to decrease stress and other exacerbating factors.   There is a website with helpful information for people with bladder irritation, called the [place] at https://www.ic-network.com. This website has more information about a healthy bladder diet and patient forums for support.  The Most Bothersome Foods* The Least Bothersome Foods*  Coffee - Regular & Decaf Tea - caffeinated Carbonated beverages - cola, non-colas, diet & caffeine-free Alcohols - Beer, Red Wine, White Wine, Champagne Fruits - Grapefruit, [place], Orange, [employer] - Cranberry, Grapefruit, Orange, Pineapple Vegetables - Tomato & Tomato Products Flavor Enhancers - Hot peppers, Spicy foods, Chili, Horseradish, Vinegar, Monosodium glutamate (MSG) Artificial Sweeteners - NutraSweet, Sweet 'N Low, Equal (sweetener), Saccharin Ethnic foods - [place], [place], [place] food Express Scripts - low-fat & whole Fruits - Bananas, Blueberries, Honeydew melon, Pears, Raisins, Watermelon Vegetables - Broccoli, Brussels Sprouts, [place], Carrots, Cauliflower, [place], Cucumber, Mushrooms, Peas, Radishes, Squash, Zucchini, White potatoes, Sweet potatoes & yams Poultry - Chicken, Eggs, [place], [employer] - Beef, Programmer, multimedia, Lamb Seafood - Shrimp, [place] fish, Salmon Grains - Oat, Rice Snacks - Pretzels, Popcorn  *Lissa Morales et al. Diet and its role in interstitial cystitis/bladder pain syndrome (IC/BPS) and comorbid conditions. [place] 2012 Jan 11.

## 2023-01-11 NOTE — Progress Notes (Signed)
[facility] New Patient Evaluation and Consultation  Referring Provider: Madelin Headings, MD PCP: Madelin Headings, MD Date of Service: 01/11/2023  SUBJECTIVE Chief Complaint: New Patient (Initial Visit) Christy Young is a 77 y.o. female is her for urinary frequency/urgency.)  History of Present Illness: Christy Young is a 77 y.o. Black or African-American female seen in consultation at the request of Dr. Fabian Sharp for evaluation of OAB. She reports that when she eats sugar she notices more frequency. Leaks with urgency. UUI> SUI   Review of records significant for: Hgb A1c 5.9 (02/16/2021)  Urinary Symptoms: Leaks urine with cough/ sneeze, going from sitting to standing, with a full bladder, and with movement to the bathroom Leaks 1-2 time(s) per days.  Pad use: 2 liners/ mini-pads per day.   She is bothered by her UI symptoms.  Day time voids 5-6.  Nocturia: 1-2 times per night to void. Voiding dysfunction: she empties her bladder well.  does not use a catheter to empty bladder.  When urinating, she feels she has no difficulties Drinks: 12oz of coffee, 24oz water, juice, and occasional wine per day   UTIs: 0 UTI's in the last year.   Denies history of blood in urine, kidney or bladder stones, pyelonephritis, bladder cancer, and kidney cancer  Pelvic Organ Prolapse Symptoms:                  She Denies a feeling of a bulge the vaginal area. She Denies seeing a bulge.    Bowel Symptom: Bowel movements: 1-2 time(s) per day Stool consistency: soft  Straining: no.  Splinting: no.  Incomplete evacuation: no.  She Denies accidental bowel leakage / fecal incontinence Bowel regimen: diet Last colonoscopy: Date 2015, Results WNL  Sexual Function Sexually active: no.  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     Past Surgical History:   Past Surgical History:  Procedure Laterality Date   ABDOMINAL HYSTERECTOMY     BREAST BIOPSY     right side   BREAST LUMPECTOMY WITH RADIOACTIVE SEED AND SENTINEL LYMPH NODE BIOPSY Left 06/07/2021   Procedure: LEFT BREAST LUMPECTOMY WITH RADIOACTIVE SEED X2 AND AXILLARY SENTINEL LYMPH NODE BIOPSY;  Surgeon: Manus Rudd, MD;  Location: [facility] OR;  Service: General;  Laterality: Left;   COLONOSCOPY     FACIAL COSMETIC SURGERY  1980     Past OB/GYN History: G4 P2 Vaginal deliveries: 2,  Forceps/ Vacuum deliveries: 0, Cesarean section: 0 Menopausal: Yes, at age 77    Medications: She has a current medication list which includes the following prescription(s): vitamin d3, estradiol, furosemide, inclisiran sodium, nebivolol, tamoxifen, trospium chloride, and allopurinol.   Allergies: Patient is allergic to pravastatin, rosuvastatin, and penicillins.   Social History:  Social History  Tobacco Use   Smoking status: Never   Smokeless tobacco: Never  Vaping Use   Vaping status: Never Used  Substance Use Topics   Alcohol use: Yes    Alcohol/week: 3.0 standard drinks of alcohol    Types: 3 Glasses of wine per week   Drug use: No    Relationship status: single She lives alone.   She is not employed. Regular exercise: No History of abuse: No  Family History:   Family History  Problem Relation Age of Onset   Hypertension Mother    Arthritis Mother    Alcohol abuse Father        deceased   Diabetes Father    Lymphoma Sister         non hodgkins   Heart attack Brother    Lung cancer Brother    Lung cancer Brother    Prostate cancer Brother        dx after 61   Lung cancer Brother    Heart attack Son 47       1999   Hyperparathyroidism Neg Hx      Review of Systems: Review of Systems  Constitutional:  Positive for malaise/fatigue. Negative for chills and fever.       +Weight Gain  Respiratory:  Negative for cough, shortness of breath and wheezing.   Cardiovascular:  Positive for leg swelling. Negative for chest pain and palpitations.  Gastrointestinal:  Negative for abdominal pain, blood in stool, constipation and heartburn.  Genitourinary:  Positive for dysuria, frequency and urgency. Negative for flank pain and hematuria.  Neurological:  Positive for weakness. Negative for dizziness and headaches.  Endo/Heme/Allergies:  Does not bruise/bleed easily.  Psychiatric/Behavioral:  Positive for depression. Negative for hallucinations. The patient is not nervous/anxious.      OBJECTIVE Physical Exam: Vitals:   01/11/23 1311  BP: 122/74  Pulse: 79  Weight: 204 lb (92.5 kg)  Height: 5' 1.8" (1.57 m)    Physical Exam Constitutional:      Appearance: Normal appearance.  Pulmonary:     Effort: Pulmonary effort is normal.  Skin:    General: Skin is warm and dry.  Neurological:     Mental Status: She is alert and oriented to person, place, and time.  Psychiatric:        Mood and Affect: Mood normal.        Behavior: Behavior normal.        Thought Content: Thought content normal.        Judgment: Judgment normal.      GU / Detailed Urogynecologic Evaluation:  Pelvic Exam: Normal external female genitalia; Bartholin's and Skene's glands normal in appearance; urethral meatus normal in appearance, no urethral masses or discharge.   CST: negative  s/p hysterectomy: Speculum exam reveals normal vaginal mucosa with  atrophy and normal vaginal cuff.  Adnexa normal adnexa.    With apex supported,  anterior compartment defect was present  Pelvic floor strength II/V  Pelvic floor musculature: Right levator non-tender, Right obturator non-tender, Left levator non-tender, Left obturator non-tender  POP-Q:   POP-Q  -1                                            Aa   -1  Ba  -5.5                                              C   4.5                                            Gh  3.5                                            Pb  8                                            tvl   -2.5                                            Ap  -2.5                                            Bp                                                 D      Rectal Exam:  Normal external rectal exam  Post-Void Residual (PVR) by Bladder Scan: In order to evaluate bladder emptying, we discussed obtaining a postvoid residual and she agreed to this procedure.  Procedure: The ultrasound unit was placed on the patient's abdomen in the suprapubic region after the patient had voided. A PVR of 70 ml was obtained by bladder scan.  Laboratory Results: POC Urine: Negative for all signs of infection  ASSESSMENT AND PLAN Ms. Decaire is a 77 y.o. with:  1. Prolapse of anterior vaginal wall   2. Vaginal vault prolapse after hysterectomy   3. Urinary frequency   4. OAB (overactive bladder)   5. Incomplete bladder emptying   6. Vaginal atrophy    POP Patient has stage II/IV anterior vaginal prolapse, I/IV vaginal vault prolapse, and stage 0-I/IV posterior vaginal wall prolapse. We discussed this could be one of the reasons she feels she is not completely emptying her bladder. We discussed that using a pessary could be helpful in supporting her bladder and helping her empty better. Will plan for her to return for pessary fitting.  OAB/Urinary Frequency  2. Patient has urinary urgency. She reports some loss of urine with trying to make it to the bathroom and  getting up frequently at night. We discussed the symptoms of overactive bladder (OAB), which include urinary urgency, urinary frequency, nocturia, with or without urge incontinence.  While we do not know the exact etiology of OAB, several treatment options exist. We discussed management including behavioral therapy (decreasing bladder irritants, urge suppression strategies,  timed voids, bladder retraining), physical therapy, medication; for refractory cases posterior tibial nerve stimulation, sacral neuromodulation, and intravesical botulinum toxin injection. For anticholinergic medications, we discussed the potential side effects of anticholinergics including dry eyes, dry mouth, constipation, cognitive impairment and urinary retention.For Beta-3 agonist medication, we discussed the potential side effect of elevated blood pressure which is more likely to occur in individuals with uncontrolled hypertension.Will start patient on Trospium 60mg  ER daily as this does not cross the blood brain barrier. Would not recommend other anticholinergics based on patients age and risk for brain fog. If Trospium is not helpful, may consider Gemtesa or Myrbetriq.   Incomplete Bladder Emptying:  3. PVR by bladder scan today was 70ml. Patient reports she frequently has to go back to back for small amounts of urine. She is not retaining by AUA standards, but she may feel more comfortable with her anterior wall re-supported and be able  to empty more fully.   Vaginal atrophy:  4. Patient has vaginal atrophy on exam. She would benefit from vaginal estrogen cream. Start on estrogen cream nightly for 2 weeks and then twice weekly after that. We discussed using finger application instead of using the applicator.      Patient to follow up for pessary fitting.    Selmer Dominion, NP

## 2023-01-16 ENCOUNTER — Ambulatory Visit: Payer: Medicare Other | Attending: Surgery | Admitting: Rehabilitation

## 2023-01-16 DIAGNOSIS — Z170 Estrogen receptor positive status [ER+]: Secondary | ICD-10-CM | POA: Insufficient documentation

## 2023-01-16 DIAGNOSIS — Z483 Aftercare following surgery for neoplasm: Secondary | ICD-10-CM | POA: Insufficient documentation

## 2023-01-16 DIAGNOSIS — C50412 Malignant neoplasm of upper-outer quadrant of left female breast: Secondary | ICD-10-CM | POA: Insufficient documentation

## 2023-01-16 NOTE — Therapy (Signed)
OUTPATIENT PHYSICAL THERAPY SOZO SCREENING NOTE   Patient Name: Christy Young MRN: [id number] DOB:11-29-1945, 77 y.o., female Today's Date: 01/16/2023  PCP: Madelin Headings, MD REFERRING PROVIDER: Manus Rudd, MD   PT End of Session - 01/16/23 1104     Visit Number 2   screen   PT Start Time 1100    PT Stop Time 1104    PT Time Calculation (min) 4 min    Activity Tolerance Patient tolerated treatment well    Behavior During Therapy [facility] for tasks assessed/performed             Past Medical History:  Diagnosis Date   ARTHRITIS    Breast cancer (HCC)    Carpal tunnel syndrome 08/23/2015   Left   Exertional dyspnea    Fatigue    Gout, unspecified    Hyperlipidemia    Hypertension    meds since age 45    Hyperuricemia    Nonspecific abnormal electrocardiogram (ECG) (EKG)    t wave non acute     OBESITY    Palpitations    hospitalized 2005 felt from stress neg cards eval   Primary hyperparathyroidism (HCC)    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   Past Surgical History:  Procedure Laterality Date   ABDOMINAL HYSTERECTOMY     BREAST BIOPSY     right side   BREAST LUMPECTOMY WITH RADIOACTIVE SEED AND SENTINEL LYMPH NODE BIOPSY Left 06/07/2021   Procedure: LEFT BREAST LUMPECTOMY WITH RADIOACTIVE SEED X2 AND AXILLARY SENTINEL LYMPH NODE BIOPSY;  Surgeon: Manus Rudd, MD;  Location: [facility] OR;  Service: General;  Laterality: Left;   COLONOSCOPY     FACIAL COSMETIC SURGERY  1980   Patient Active Problem List   Diagnosis Date Noted   Statin myopathy 06/29/2022   Malignant neoplasm of upper-outer quadrant of left breast in female, estrogen receptor positive (HCC) 05/30/2021   Pain of left lower extremity 03/16/2021   Exertional dyspnea 03/08/2021   Decreased exercise tolerance 03/08/2021   Hyperparathyroidism (HCC) 02/09/2018   Abnormal prominence of clavicle 02/07/2018   Psoriasis 10/23/2017   Carpal tunnel syndrome 08/23/2015   Ulnar  neuropathy at elbow of left upper extremity 08/23/2015   H/O: gout 08/08/2015   Elevated blood sugar 08/08/2015   Essential hypertension 07/09/2014   Mixed hyperlipidemia 07/09/2014   Body aches 07/09/2014   Urinary frequency 07/09/2014   Numbness and tingling in left hand 07/09/2014   Visit for preventive health examination 04/11/2013   Obesity (BMI 30-39.9) 04/11/2013   Statin intolerance 04/11/2013   At risk for coronary artery disease 05/15/2012   Family history of premature coronary heart disease 04/23/2012   Fatigue 12/29/2010   Nonspecific abnormal electrocardiogram (ECG) (EKG) 12/29/2010   Hyperuricemia 12/29/2010   Palpitations    OBESITY 01/10/2010   HYPERLIPIDEMIA 11/09/2009   Gout 11/09/2009   HYPERTENSION 11/09/2009   ARTHRITIS 11/09/2009    REFERRING DIAG: left breast cancer at risk for lymphedema  THERAPY DIAG:  Aftercare following surgery for neoplasm  Malignant neoplasm of upper-outer quadrant of left breast in female, estrogen receptor positive (HCC)  PERTINENT HISTORY: Patient underwent a left lumpectomy and sentinel node biopsy (1 negative node) on 06/07/2021. It is ER/PR positive and HER2 negative with a Ki67 of 40%.   PRECAUTIONS: left UE Lymphedema risk, None  SUBJECTIVE: I think my arm has been swelling again.   PAIN:  Are you having pain? No  SOZO SCREENING: Patient was assessed today using  the SOZO machine to determine the lymphedema index score. This was compared to her baseline score. It was determined that she is again within the recommended range when compared to her baseline. She has her compression sleeve and will continue to wear it when active or traveling. We will return to 3 month screens.    L-DEX FLOWSHEETS - 01/16/23 1100       L-DEX LYMPHEDEMA SCREENING   Measurement Type Unilateral    L-DEX MEASUREMENT EXTREMITY Upper Extremity    POSITION  Standing    DOMINANT SIDE Right    At Risk Side Left    BASELINE SCORE (UNILATERAL)  2.9    L-DEX SCORE (UNILATERAL) 7.7    VALUE CHANGE (UNILAT) 4.8               Kerron Sedano R, PT 01/16/2023, 11:04 AM

## 2023-02-14 ENCOUNTER — Ambulatory Visit: Payer: Medicare Other | Admitting: Obstetrics and Gynecology

## 2023-02-28 ENCOUNTER — Encounter: Payer: Self-pay | Admitting: Cardiology

## 2023-02-28 ENCOUNTER — Encounter: Payer: Self-pay | Admitting: Hematology

## 2023-02-28 ENCOUNTER — Encounter: Payer: Self-pay | Admitting: Internal Medicine

## 2023-02-28 ENCOUNTER — Ambulatory Visit (INDEPENDENT_AMBULATORY_CARE_PROVIDER_SITE_OTHER): Payer: Medicare Other | Admitting: Internal Medicine

## 2023-02-28 VITALS — BP 120/80 | HR 82 | Ht 61.8 in | Wt 202.0 lb

## 2023-02-28 DIAGNOSIS — E210 Primary hyperparathyroidism: Secondary | ICD-10-CM | POA: Diagnosis not present

## 2023-02-28 NOTE — Progress Notes (Unsigned)
Name: Christy Young  MRN/ DOB: [id number], December 09, 1945    Age/ Sex: 77 y.o., female     PCP: Madelin Headings, MD   Reason for Endocrinology Evaluation: Hyperparathyroidism     Initial Endocrinology Clinic Visit: 02/07/2018    PATIENT IDENTIFIER: [name] is a 77 y.o., female with a past medical history of Hx of breast CA. She has followed with [facility] since 02/07/2018 for consultative assistance with management of her hyperparathyroidism.   HISTORICAL SUMMARY: The patient was first diagnosed with high parathyroidism since 2018.  With a serum calcium max level of 11.4 MG/DL in 9147, and a max level of PTH at 196 PG/mL   DXA normal in December 2021 She had followed up with Dr. Everardo All from 2019 until 2023   24-hour urinary calcium normal at 225 mg 02/2022  SUBJECTIVE:    Today (02/28/2023):  Christy Young is here for follow-up on hyperparathyroidism   She stays hydrated  Has incontinence due to prolapsed bladder  , which limits hydration  Was prescribed topical estrogen , pending PT evaluation  Denies renal stones  Denies polydipsia  Denies constipation Denies recent falls   Vitamin D 5000 iu daily   HISTORY:  Past Medical History:  Past Medical History:  Diagnosis Date   ARTHRITIS    Breast cancer (HCC)    Carpal tunnel syndrome 08/23/2015   Left   Exertional dyspnea    Fatigue    Gout, unspecified    Hyperlipidemia    Hypertension    meds since age 38    Hyperuricemia    Nonspecific abnormal electrocardiogram (ECG) (EKG)    t wave non acute     OBESITY    Palpitations    hospitalized 2005 felt from stress neg cards eval   Primary hyperparathyroidism (HCC)    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 She has never used smokeless tobacco. She reports current alcohol use of about 3.0 standard drinks of alcohol per week. She reports that she does not use drugs. Family History:  Family History  Problem Relation Age of Onset   Hypertension Mother    Arthritis Mother    Alcohol abuse Father        deceased   Diabetes Father    Lymphoma Sister        non hodgkins   Heart attack Brother    Lung cancer Brother    Lung cancer Brother    Prostate cancer Brother        dx after 36   Lung cancer Brother    Heart attack Son 81       1999   Hyperparathyroidism Neg Hx      HOME MEDICATIONS: Allergies as of 02/28/2023       Reactions   Pravastatin Other (See Comments)   Body MS aches and pain s   Rosuvastatin    REACTION:  leg cramps.   Penicillins Other (See Comments)   Patient  says she is not allergic to amoxicillin or augmentin   Has taken these wo problem ? Of SE when she was 19  Not sever         Medication List        Accurate as of February 28, 2023 12:43 PM. If you have any questions, ask your nurse or doctor.          allopurinol 100 MG tablet Commonly known as: ZYLOPRIM TAKE 2 TABLETS BY MOUTH EVERY DAY, SCHEDULE APPOINTMENT FOR REFILLS   estradiol 0.1 MG/GM vaginal cream Commonly known as: ESTRACE Place 0.5 g vaginally 2 (two) times a week. Place 0.5g nightly for two weeks then twice a week after   furosemide 20 MG tablet Commonly known as: LASIX Take 20 mg by mouth as needed for fluid or edema.   LEQVIO [place] Inject into the skin.   nebivolol 5 MG tablet Commonly known as: BYSTOLIC Take 1 tablet (5 mg total) by mouth daily.   tamoxifen 20 MG  tablet Commonly known as: NOLVADEX TAKE 1 TABLET BY MOUTH EVERY DAY   Trospium Chloride 60 MG Cp24 Take 1 capsule (60 mg total) by mouth daily.   Vitamin D3 125 MCG (5000 UT) Caps Take 1 capsule (5,000 Units total) by mouth daily.          OBJECTIVE:   PHYSICAL EXAM: VS: There were no vitals taken for this visit.   EXAM: General: Pt appears well and is in NAD  Hydration: Well-hydrated with moist mucous membranes and good skin turgor  Eyes: External eye exam normal without stare, lid lag or exophthalmos.  EOM intact.  PERRL.  Ears, Nose, Throat: Hearing: Grossly intact bilaterally Dental: Good dentition  Throat: Clear without mass, erythema or exudate  Neck: General: Supple without adenopathy. Thyroid: Thyroid size normal.  No goiter or nodules appreciated. No thyroid bruit.  Lungs: Clear with good BS bilat with no rales, rhonchi, or wheezes  Heart: Auscultation: RRR.  Abdomen: Normoactive bowel sounds, soft, nontender, without masses or organomegaly palpable  Extremities:  BL LE: No pretibial edema normal ROM and strength.  Mental Status: Judgment, insight: Intact Orientation: Oriented to time, place, and person Mood and affect: No depression, anxiety, or agitation     DATA REVIEWED:   Latest Reference Range & Units 08/22/22 11:20  Sodium 135 - 145 mEq/L 143  Potassium 3.5 - 5.1 mEq/L 3.9  Chloride 96 - 112 mEq/L 109  CO2 19 - 32 mEq/L 26  Glucose 70 - 99 mg/dL 95  BUN 6 - 23 mg/dL 15  Creatinine 4.40 - 3.47 mg/dL 4.25  Calcium 8.4 - 95.6 mg/dL 38.7 (H)  Albumin 3.5 - 5.2 g/dL 3.8  GFR >56.43 mL/min 84.06    Latest Reference Range & Units 08/22/22 11:20  VITD 30.00 - 100.00 ng/mL 35.42    Latest Reference Range & Units 08/22/22 11:20  PTH, Intact 16 - 77 pg/mL 112 (H)     ASSESSMENT / PLAN / RECOMMENDATIONS:   Primary hyperparathyroidism:  -Serum calcium remains elevated but stable, it has trended down since discontinuation of multivitamin -DXA  normal 05/2020 - 24-hour urinary excretion of calcium normal 225 mg 02/2022 -No indication for surgical intervention at this time   Medications  Stay hydrated Maintain 2-3 servings of dietary calcium daily Continue vitamin D3 5000 IU     Follow-up in 1 YR    Signed electronically by: Lyndle Herrlich, MD  [facility] End[facility]  [facility] [address] [place], [place] [address] Phone: [phone] FAX: [phone]      CC: Madelin Headings, MD [address] [place] [address] Phone: [phone]  Fax: [phone]   Return to Endocrinology clinic as below: Future Appointments  Date Time Provider Department Center  02/28/2023  2:40 PM Berthe Oley, Konrad Dolores, MD [facility] None  03/26/2023 11:00 AM Theressa Millard, PT [facility] None  04/23/2023  9:20 AM Berna Spare A, PTA [facility] None  04/30/2023 10:30 AM [facility] None  04/30/2023 11:00 AM Pollyann Samples, NP [facility] None  05/07/2023  3:30 PM [facility]-ANNUAL WELLNESS VISIT [facility]  05/16/2023  9:15 AM [facility] 1 [facility] None  01/03/2024  1:00 PM Patwardhan, Anabel Bene, MD [facility] None

## 2023-02-28 NOTE — Patient Instructions (Signed)
Stay hydrated  Avoid over the counter calcium tablets  Maintain 2-3 servings of calcium through the diet daily

## 2023-03-01 ENCOUNTER — Telehealth: Payer: Self-pay | Admitting: Internal Medicine

## 2023-03-01 LAB — BASIC METABOLIC PANEL
BUN: 18 mg/dL (ref 6–23)
CO2: 26 meq/L (ref 19–32)
Calcium: 11 mg/dL — ABNORMAL HIGH (ref 8.4–10.5)
Chloride: 108 meq/L (ref 96–112)
Creatinine, Ser: 0.85 mg/dL (ref 0.40–1.20)
GFR: 66.12 mL/min (ref >60.00)
Glucose, Bld: 94 mg/dL (ref 70–99)
Potassium: 4.4 meq/L (ref 3.5–5.1)
Sodium: 141 meq/L (ref 135–145)

## 2023-03-01 LAB — ALBUMIN: Albumin: 4 g/dL (ref 3.5–5.2)

## 2023-03-01 LAB — PARATHYROID HORMONE, INTACT (NO CA): PTH: 193 pg/mL — ABNORMAL HIGH (ref 16–77)

## 2023-03-01 LAB — VITAMIN D 25 HYDROXY (VIT D DEFICIENCY, FRACTURES): VITD: 51.77 ng/mL (ref 30.00–100.00)

## 2023-03-01 LAB — CALCIUM, IONIZED: Calcium, Ion: 5.7 mg/dL — ABNORMAL HIGH (ref 4.7–5.5)

## 2023-03-01 NOTE — Telephone Encounter (Signed)
Please let the patient know that her serum calcium has increased again, kidney function and vitamin D are normal   Please emphasize the importance of hydration as well as making sure she consumes 2-3 servings of dietary calcium through her food daily (low-fat dairy, green leafy vegetables) and to avoid over-the-counter calcium tablets(which she has already done)   Thanks

## 2023-03-01 NOTE — Telephone Encounter (Signed)
Patient aware and verbalized understanding. °

## 2023-03-25 ENCOUNTER — Other Ambulatory Visit: Payer: Self-pay | Admitting: Family Medicine

## 2023-03-26 ENCOUNTER — Encounter: Payer: Self-pay | Admitting: Physical Therapy

## 2023-03-26 ENCOUNTER — Ambulatory Visit: Payer: Medicare Other | Attending: Obstetrics and Gynecology | Admitting: Physical Therapy

## 2023-03-26 ENCOUNTER — Other Ambulatory Visit: Payer: Self-pay

## 2023-03-26 DIAGNOSIS — N3281 Overactive bladder: Secondary | ICD-10-CM | POA: Insufficient documentation

## 2023-03-26 DIAGNOSIS — R278 Other lack of coordination: Secondary | ICD-10-CM | POA: Diagnosis not present

## 2023-03-26 DIAGNOSIS — R339 Retention of urine, unspecified: Secondary | ICD-10-CM | POA: Insufficient documentation

## 2023-03-26 DIAGNOSIS — N993 Prolapse of vaginal vault after hysterectomy: Secondary | ICD-10-CM | POA: Insufficient documentation

## 2023-03-26 DIAGNOSIS — N8110 Cystocele, unspecified: Secondary | ICD-10-CM | POA: Diagnosis present

## 2023-03-26 DIAGNOSIS — M6281 Muscle weakness (generalized): Secondary | ICD-10-CM | POA: Diagnosis not present

## 2023-03-26 NOTE — Therapy (Signed)
OUTPATIENT PHYSICAL THERAPY FEMALE PELVIC EVALUATION   Patient Name: Christy Young MRN: [id number] DOB:11-Dec-1945, 77 y.o., female Today's Date: 03/26/2023  END OF SESSION:  PT End of Session - 03/26/23 1117     Visit Number 1   pelvic floor   Date for PT Re-Evaluation 05/21/23   pelvic floor   Authorization Type Medicare    Authorization - Visit Number 1    Authorization - Number of Visits 10    PT Start Time 1115   came late   PT Stop Time 1145    PT Time Calculation (min) 30 min    Activity Tolerance Patient tolerated treatment well    Behavior During Therapy [facility] for tasks assessed/performed             Past Medical History:  Diagnosis Date   ARTHRITIS    Breast cancer (HCC)    Carpal tunnel syndrome 08/23/2015   Left   Exertional dyspnea    Fatigue    Gout, unspecified    Hyperlipidemia    Hypertension    meds since age 12    Hyperuricemia    Nonspecific abnormal electrocardiogram (ECG) (EKG)    t wave non acute     OBESITY    Palpitations    hospitalized 2005 felt from stress neg cards eval   Primary hyperparathyroidism (HCC)    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   Past Surgical History:  Procedure Laterality Date   ABDOMINAL HYSTERECTOMY     BREAST BIOPSY     right side   BREAST LUMPECTOMY WITH RADIOACTIVE SEED AND SENTINEL LYMPH NODE BIOPSY Left 06/07/2021   Procedure: LEFT BREAST LUMPECTOMY WITH RADIOACTIVE SEED X2 AND AXILLARY SENTINEL LYMPH NODE BIOPSY;  Surgeon: Manus Rudd, MD;  Location: [facility] OR;  Service: General;  Laterality: Left;   COLONOSCOPY     FACIAL COSMETIC SURGERY  1980   Patient Active Problem List   Diagnosis Date Noted   Statin myopathy 06/29/2022   Malignant neoplasm of upper-outer quadrant of left breast in female, estrogen receptor positive (HCC) 05/30/2021   Pain of left lower extremity 03/16/2021   Exertional dyspnea 03/08/2021   Decreased exercise tolerance 03/08/2021   Hyperparathyroidism  (HCC) 02/09/2018   Abnormal prominence of clavicle 02/07/2018   Psoriasis 10/23/2017   Carpal tunnel syndrome 08/23/2015   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   H/O: gout 08/08/2015   Elevated blood sugar 08/08/2015   Essential hypertension 07/09/2014   Mixed hyperlipidemia 07/09/2014   Body aches 07/09/2014   Urinary frequency 07/09/2014   Numbness and tingling in left hand 07/09/2014   Visit for preventive health examination 04/11/2013   Obesity (BMI 30-39.9) 04/11/2013   Statin intolerance 04/11/2013   At risk for coronary artery disease 05/15/2012   Family history of premature coronary heart disease 04/23/2012   Fatigue 12/29/2010   Nonspecific abnormal electrocardiogram (ECG) (EKG) 12/29/2010   Hyperuricemia 12/29/2010   Palpitations    OBESITY 01/10/2010   HYPERLIPIDEMIA 11/09/2009   Gout 11/09/2009   HYPERTENSION 11/09/2009   ARTHRITIS 11/09/2009    PCP: Madelin Headings, MD  REFERRING PROVIDER: Selmer Dominion, NP   REFERRING DIAG:  N32.81 (ICD-10-CM) - OAB (overactive bladder)  N81.10 (ICD-10-CM) - Prolapse of anterior vaginal wall  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  Rationale for Evaluation and Treatment: Rehabilitation  ONSET DATE:  4/23  SUBJECTIVE:                                                                                                                                                                                           SUBJECTIVE STATEMENT: Frequent urination started after her radiation for breast cancer. Patient is on Tamoxifen. She will not drink anything when traveling. I have a prolapsed bladder. I have some trouble with memory. I will be getting a pessary in the future.  Fluid intake: 12oz of coffee, 24oz water, juice, and occasional wine per day    PAIN:  Are you having pain? Yes NPRS scale:  4/10 Pain location:  low back  Pain type: aching and sharp Pain description: intermittent   Aggravating factors: sleeping, movement Relieving factors: move in bed  PRECAUTIONS: Other: breast cancer  RED FLAGS: None   WEIGHT BEARING RESTRICTIONS: No  FALLS:  Has patient fallen in last 6 months? No  LIVING ENVIRONMENT: Lives with: lives alone OCCUPATION: retired  PLOF: Independent  PATIENT GOALS: reduce urinary frequency, education on management of prolapse  PERTINENT HISTORY:  Breast cancer; Abdominal hysterectomy;   BOWEL MOVEMENT: regular bowel movements   URINATION: Pain with urination: No Fully empty bladder: Yes: until I had the test done Stream: Strong Urgency: Yes:   Frequency: Day time voids 5-6.  Nocturia: 1-2 times per night to void.  Leakage: Walking to the bathroom, Coughing, Sneezing, and going from sit to stand Leaks 1-2 time(s) per days  Pads: 2 liners/ mini-pads per day.   INTERCOURSE:not active  PREGNANCY: Vaginal deliveries 2   PROLAPSE: Cystocele and vaginal vault prolapse   OBJECTIVE:   DIAGNOSTIC FINDINGS:  Pelvic floor strength II/V  PVR of 70 ml was obtained by bladder scan.   COGNITION: Overall cognitive status: Within functional limits for tasks assessed       LOWER EXTREMITY ROM:  Passive ROM Right eval Left eval  Hip external rotation 40 40   (Blank rows = not tested)  LOWER EXTREMITY MMT: bilateral hip strength is 4/5.    PALPATION:   General  rib angle greater than 90 degrees; decreased movement of lower rib cage; bulge the lower abdominals with contraction                External Perineal Exam not assessed                             Internal Pelvic Floor not assessed  Patient confirms identification and approves PT to assess internal pelvic floor and treatment No  PELVIC MMT:   MMT eval  Vaginal 2/5 as per MD  (Blank rows = not tested)        TONE: Not assessed today   TODAY'S TREATMENT:                                                                                                                               DATE: 03/26/23  EVAL See below   PATIENT EDUCATION:  Education details: educated patient on what will happen with treatment, went over her goals, and instructed her on how to assess her own vaginal muscle strength Person educated: Patient Education method: Explanation Education comprehension: verbalized understanding  HOME EXERCISE PROGRAM: See above.   ASSESSMENT:  CLINICAL IMPRESSION: Patient is a 77 y.o. female who was seen today for physical therapy evaluation and treatment for overactive bladder and prolapse. Patient has history of breast cancer with treatment of radiation. She noticed her symptoms 4/24 after she was done with her radiation. She is not taking Tamoxifen. Patient reports she will leak urine with   walking to the bathroom, coughing, sneezing, and going from sit to stand. She will leaks 1-2 time(s) per days. Patient does not empty her bladder fully. She has an anterior wall prolapse and vaginal vault prolapse. She was not comfortable with the therapist assess her pelvic floor at this time but her physician reported her pelvic floor strength was 2/5.  Patient will benefit from skilled therapy to reduce urinary leakage and urgency, education on prolapse, and improve emptying her bladder.   OBJECTIVE IMPAIRMENTS: decreased coordination, decreased strength, and increased fascial restrictions.   ACTIVITY LIMITATIONS: continence, toileting, and locomotion level  PARTICIPATION LIMITATIONS: community activity  PERSONAL FACTORS: Fitness, Time since onset of injury/illness/exacerbation, and 3+ comorbidities: Breast cancer; Abdominal hysterectomy  are also affecting patient's functional outcome.   REHAB POTENTIAL: Good  CLINICAL DECISION MAKING: Stable/uncomplicated  EVALUATION COMPLEXITY: Low   GOALS: Goals reviewed with patient? Yes  SHORT TERM GOALS:  Target date: 04/23/23  Patient independent with initial HEP.  Baseline: Goal status: INITIAL  2.  Patient has been educated on urge to void behavioral technique.  Baseline:  Goal status: INITIAL  3.  Patient is able to feel her pelvic floor contract by lifting it off the mat.  Baseline:  Goal status: INITIAL    LONG TERM GOALS: Target date: 05/21/23  Patient independent with advanced HEP for core and pelvic floor strength.  Baseline:  Goal status: INITIAL  2.  Patient understands ways to manage pelvic prolapse to reduce strain on the pelvic floor.  Baseline:  Goal status: INITIAL  3.  Patient is educated on correct ways to have a bowel movement to reduce strain on pelvic floor.  Baseline:  Goal status: INITIAL  4.  Patient has increased pelvic floor strength so she is able to walk to the bathroom without leaking urine.  Baseline:  Goal status: INITIAL  5.  Patient reports her urinary leakage decreased >/= 70% due to improved coordination  of the pelvic floor muscles.  Baseline:  Goal status: INITIAL   PLAN:  PT FREQUENCY: 1x/week  PT DURATION: 8 weeks  PLANNED INTERVENTIONS: Therapeutic exercises, Therapeutic activity, Neuromuscular re-education, Balance training, Patient/Family education, Joint mobilization, Dry Needling, Electrical stimulation, Biofeedback, and Manual therapy  PLAN FOR NEXT SESSION: see if she was able to assess her pelvic floor, RUSI, diaphragmatic breathing, prolapse education, bed mobility with breathing out   Eulis Foster, PT 03/26/23 5:42 PM

## 2023-04-08 ENCOUNTER — Other Ambulatory Visit: Payer: Self-pay | Admitting: Hematology

## 2023-04-11 ENCOUNTER — Encounter: Payer: Self-pay | Admitting: Physical Therapy

## 2023-04-11 ENCOUNTER — Ambulatory Visit: Payer: Medicare Other | Attending: Obstetrics and Gynecology | Admitting: Physical Therapy

## 2023-04-11 DIAGNOSIS — M6281 Muscle weakness (generalized): Secondary | ICD-10-CM | POA: Insufficient documentation

## 2023-04-11 DIAGNOSIS — R278 Other lack of coordination: Secondary | ICD-10-CM | POA: Diagnosis not present

## 2023-04-11 NOTE — Therapy (Signed)
OUTPATIENT PHYSICAL THERAPY FEMALE PELVIC EVALUATION   Patient Name: Christy Young MRN: [id number] DOB:Dec 30, 1945, 77 y.o., female Today's Date: 04/11/2023  END OF SESSION:  PT End of Session - 04/11/23 1400     Visit Number 2   Pelvic floor   Date for PT Re-Evaluation 05/21/23   pelvic floor   Authorization Type Medicare    Authorization Time Period pelvic 05/21/23;    Authorization - Visit Number 2    Authorization - Number of Visits 10    PT Start Time 1400    PT Stop Time 1440    PT Time Calculation (min) 40 min    Activity Tolerance Patient tolerated treatment well    Behavior During Therapy WFL for tasks assessed/performed             Past Medical History:  Diagnosis Date   ARTHRITIS    Breast cancer (HCC)    Carpal tunnel syndrome 08/23/2015   Left   Exertional dyspnea    Fatigue    Gout, unspecified    Hyperlipidemia    Hypertension    meds since age 4    Hyperuricemia    Nonspecific abnormal electrocardiogram (ECG) (EKG)    t wave non acute     OBESITY    Palpitations    hospitalized 2005 felt from stress neg cards eval   Primary hyperparathyroidism (HCC)    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   Past Surgical History:  Procedure Laterality Date   ABDOMINAL HYSTERECTOMY     BREAST BIOPSY     right side   BREAST LUMPECTOMY WITH RADIOACTIVE SEED AND SENTINEL LYMPH NODE BIOPSY Left 06/07/2021   Procedure: LEFT BREAST LUMPECTOMY WITH RADIOACTIVE SEED X2 AND AXILLARY SENTINEL LYMPH NODE BIOPSY;  Surgeon: Manus Rudd, MD;  Location: [facility] OR;  Service: General;  Laterality: Left;   COLONOSCOPY     FACIAL COSMETIC SURGERY  1980   Patient Active Problem List   Diagnosis Date Noted   Statin myopathy 06/29/2022   Malignant neoplasm of upper-outer quadrant of left breast in female, estrogen receptor positive (HCC) 05/30/2021   Pain of left lower extremity 03/16/2021   Exertional dyspnea 03/08/2021   Decreased exercise tolerance  03/08/2021   Hyperparathyroidism (HCC) 02/09/2018   Abnormal prominence of clavicle 02/07/2018   Psoriasis 10/23/2017   Carpal tunnel syndrome 08/23/2015   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   H/O: gout 08/08/2015   Elevated blood sugar 08/08/2015   Essential hypertension 07/09/2014   Mixed hyperlipidemia 07/09/2014   Body aches 07/09/2014   Urinary frequency 07/09/2014   Numbness and tingling in left hand 07/09/2014   Visit for preventive health examination 04/11/2013   Obesity (BMI 30-39.9) 04/11/2013   Statin intolerance 04/11/2013   At risk for coronary artery disease 05/15/2012   Family history of premature coronary heart disease 04/23/2012   Fatigue 12/29/2010   Nonspecific abnormal electrocardiogram (ECG) (EKG) 12/29/2010   Hyperuricemia 12/29/2010   Palpitations    OBESITY 01/10/2010   HYPERLIPIDEMIA 11/09/2009   Gout 11/09/2009   Essential hypertension 11/09/2009   Arthropathy 11/09/2009    PCP: Madelin Headings, MD  REFERRING PROVIDER: Selmer Dominion, NP   REFERRING DIAG:  N32.81 (ICD-10-CM) - OAB (overactive bladder)  N81.10 (ICD-10-CM) - Prolapse of anterior vaginal wall  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 I was able to get up to walk. I hit my head and it is fine. I checked my pelvic floor strength when I put the cream on. I need to drink more water. I had to miss the appointment for the pessary.   Fluid intake: 12oz of coffee, 24oz water,  juice, and occasional wine per day    PAIN:  Are you having pain? Yes NPRS scale: 4/10 Pain location:  low back  Pain type: aching and sharp Pain description: intermittent   Aggravating factors: sleeping, movement Relieving factors: move in bed  PRECAUTIONS: Other: breast cancer  RED FLAGS: None   WEIGHT BEARING RESTRICTIONS: No  FALLS:  Has patient fallen in last 6 months? No  LIVING ENVIRONMENT: Lives with: lives alone OCCUPATION: retired  PLOF: Independent  PATIENT GOALS: reduce urinary frequency, education on management of prolapse  PERTINENT HISTORY:  Breast cancer; Abdominal hysterectomy;   BOWEL MOVEMENT: regular bowel movements   URINATION: Pain with urination: No Fully empty bladder: Yes: until I had the test done Stream: Strong Urgency: Yes:   Frequency: Day time voids 5-6.  Nocturia: 1-2 times per night to void.  Leakage: Walking to the bathroom, Coughing, Sneezing, and going from sit to stand Leaks 1-2 time(s) per days  Pads: 2 liners/ mini-pads per day.   INTERCOURSE:not active  PREGNANCY: Vaginal deliveries 2   PROLAPSE: Cystocele and vaginal vault prolapse   OBJECTIVE:   DIAGNOSTIC FINDINGS:  Pelvic floor strength II/V  PVR of 70 ml was obtained by bladder scan.   COGNITION: Overall cognitive status: Within functional limits for tasks assessed       LOWER EXTREMITY ROM:  Passive ROM Right eval Left eval  Hip external rotation 40 40   (Blank rows = not tested)  LOWER EXTREMITY MMT: bilateral hip strength is 4/5.    PALPATION:   General  rib angle greater than 90 degrees; decreased movement of lower rib cage; bulge the lower abdominals with contraction                External Perineal Exam not assessed                             Internal Pelvic  Floor not assessed  Patient confirms identification and approves PT to assess internal pelvic floor and treatment No  PELVIC MMT:   MMT eval  Vaginal 2/5 as per MD  (Blank  rows = not tested)        TONE: Not assessed today   TODAY'S TREATMENT:     04/11/23 Neuromuscular re-education: Down training: Diaphragmatic breathing with tactile cues to not allow the breath into the chest but into the abdomen 20 x Educated patient on urge to void to reduce the urge to void and how to deter it with diaphragmatic breathing and pelvic floor contraction Exercises: Strengthening: Supine ball squeeze holding 5 sec 10 x with pelvic floor contraction Bridge with ball squeeze and pelvic floor contraction 10 x  Supine knees hip abduction with pelvic floor and yellow band 15 x Therapeutic activities: Functional strengthening activities: Rolling from supine to sitting using breath to help to reduce pressure on the prolapse Sit to stand with placing hands in the groin area and breathing out 5 x                                                                                                                            PATIENT EDUCATION: 04/11/23 Education details: Access Code: [email or web address] Person educated: Patient Education method: Programmer, multimedia, Facilities manager, Actor cues, Verbal cues, and Handouts Education comprehension: verbalized understanding, returned demonstration, verbal cues required, tactile cues required, and needs further education   HOME EXERCISE PROGRAM: 04/11/23 Access Code: [email or web address] URL: [email or web address] Date: 04/11/2023 Prepared by: Eulis Foster  Exercises - Supine Diaphragmatic Breathing  - 1 x daily - 7 x weekly - 1 sets - 10 reps - Pelvic Floor Contractions in Hooklying with Adduction  - 1 x daily - 7 x weekly - 1 sets - 10 reps - Supine Bridge with Mini Swiss Ball Between Knees  - 1 x daily - 7 x weekly - 1 sets - 10 reps - Pelvic Floor Contractions in Hooklying with Resisted Abduction  - 1 x daily - 7 x weekly - 3 sets - 10 reps    ASSESSMENT:  CLINICAL IMPRESSION: Patient is a 77 y.o. female who was seen today for physical  therapy  treatment for overactive bladder and prolapse.  Patient is able to feel the pelvic floor lift with contractions and exercises. She is able to go from sit to stand easier with breathing out and placing hands in the groin.  Patient was able to deter the urge to void when doing the exercise during treatment 1 time but not the other time when she had to go to the bathroom. Patient will benefit from skilled therapy to reduce urinary leakage and urgency, education on prolapse, and improve emptying her bladder.   OBJECTIVE IMPAIRMENTS: decreased coordination, decreased strength, and increased fascial restrictions.   ACTIVITY LIMITATIONS: continence, toileting, and locomotion level  PARTICIPATION LIMITATIONS: community activity  PERSONAL FACTORS: Fitness, Time since onset of injury/illness/exacerbation, and 3+ comorbidities: Breast cancer; Abdominal hysterectomy  are also affecting patient's functional outcome.   REHAB POTENTIAL: Good  CLINICAL DECISION MAKING: Stable/uncomplicated  EVALUATION COMPLEXITY: Low   GOALS: Goals reviewed with patient? Yes  SHORT TERM GOALS: Target date: 04/23/23  Patient independent with initial HEP.  Baseline: Goal status: INITIAL  2.  Patient has been educated on urge to void behavioral technique.  Baseline:  Goal status: Met 04/11/23  3.  Patient is able to feel her pelvic floor contract by lifting it off the mat.  Baseline:  Goal status: INITIAL    LONG TERM GOALS: Target date: 05/21/23  Patient independent with advanced HEP for core and pelvic floor strength.  Baseline:  Goal status: INITIAL  2.  Patient understands ways to manage pelvic prolapse to reduce strain on the pelvic floor.  Baseline:  Goal status: INITIAL  3.  Patient is educated on correct ways to have a bowel movement to reduce strain on pelvic floor.  Baseline:  Goal status: INITIAL  4.  Patient has increased pelvic floor strength so she is able to walk to the bathroom  without leaking urine.  Baseline:  Goal status: INITIAL  5.  Patient reports her urinary leakage decreased >/= 70% due to improved coordination of the pelvic floor muscles.  Baseline:  Goal status: INITIAL   PLAN:  PT FREQUENCY: 1x/week  PT DURATION: 8 weeks  PLANNED INTERVENTIONS: Therapeutic exercises, Therapeutic activity, Neuromuscular re-education, Balance training, Patient/Family education, Joint mobilization, Dry Needling, Electrical stimulation, Biofeedback, and Manual therapy  PLAN FOR NEXT SESSION:   prolapse education, bed mobility with breathing out, standing HEP with balance   Eulis Foster, PT 04/11/23 2:01 PM

## 2023-04-11 NOTE — Patient Instructions (Signed)
Urge Incontinence  Ideal urination frequency is every 2-4 wakeful hours, which equates to 5-8 times within a 24-hour period.   Urge incontinence is leakage that occurs when the bladder muscle contracts, creating a sudden need to go before getting to the bathroom.   Going too often when your bladder isn't actually full can disrupt the body's automatic signals to store and hold urine longer, which will increase urgency/frequency.  In this case, the bladder "is running the show" and strategies can be learned to retrain this pattern.   One should be able to control the first urge to urinate, at around .  The bladder can hold up to a "grande latte," or . To help you gain control, practice the Urge Drill below when urgency strikes.  This drill will help retrain your bladder signals and allow you to store and hold urine longer.  The overall goal is to stretch out your time between voids to reach a more manageable voiding schedule.    Practice your "quick flicks" often throughout the day (each waking hour) even when you don't need feel the urge to go.  This will help strengthen your pelvic floor muscles, making them more effective in controlling leakage.  Urge Drill  When you feel an urge to go, follow these steps to regain control: Stop what you are doing and be still Take one deep breath, directing your air into your abdomen Think an affirming thought, such as "I've got this." Do 5 quick flicks of your pelvic floor Walk with control to the bathroom to void, or delay voiding  [facility] [address], [address] [place], [place] [address] Phone # [phone] Fax [phone]

## 2023-04-20 ENCOUNTER — Encounter: Payer: Self-pay | Admitting: Physical Therapy

## 2023-04-20 ENCOUNTER — Ambulatory Visit: Payer: Medicare Other | Admitting: Physical Therapy

## 2023-04-20 DIAGNOSIS — M6281 Muscle weakness (generalized): Secondary | ICD-10-CM

## 2023-04-20 DIAGNOSIS — R278 Other lack of coordination: Secondary | ICD-10-CM | POA: Diagnosis not present

## 2023-04-20 NOTE — Patient Instructions (Signed)
How To Poop Better:  What are Good Poops? There is no one exact normal, but they should be REGULAR.  This varies from person to person and ranges from up to 3x/day or as little as 3-4/week.  This should stay consistent for you.  They should be formed and ideally one solid mass that doesn't fall apart or dissolve in the water and is brown in color.  Lifestyle Tips:  Fiber: Eat 25-31 grams per day Do not hold it.  If you need to go, GO! Try to go every day around the same time Walk and move more Probiotics for more healthy gut bacteria Water and fluids: half of your healthy body weight in ounces  Toileting Tips:  Posture: knees above hips, back flat, look straight ahead, RELAX Relax all the muscles from your face down to your toes Breathe: slow deep breaths into your belly and pelvic floor is RELAXED Blow: Tighten belly and blow like blowing up a balloon, make "SH" sound, make a vowel sound with a deep voice Do NOT sit more than 10 minutes After you are finished, tighten the muscles to reset pelvic floor back to normal     About Pelvic Support Problems Pelvic Support Problems Explained Ligaments, muscles, and connective tissue normally hold your bladder, uterus, and other organs in their proper places in your pelvis. When these tissues become weak, a problem with pelvic support may result. Weak support can cause one or more of the pelvic organs to drop down into the vagina. An organ may even drop so far that is partially exposed outside the body.  Pelvic support problems are named by the change in the organ. The main types of pelvic support problems are:  Cystocele: When the bladder drops down into your vagina.  Enterocele: When your small intestine drops between your vagina and rectum.  Rectocele: When your rectum bulges into the vaginal wall.  Uterine prolapse: When your uterus drops into your vagina.  Vaginal prolapse: When the top part of the vagina begins to droop. This sometimes  happens after a hysterectomy (removal of the uterus).  Causes Pelvic support problems can be caused by many conditions. They may begin after you give birth, especially if you had a large baby. During childbirth, the muscles and skin of the birth canal (vagina) are stretched and sometimes torn. They heal over time but are not always exactly the same. A long pushing stage of labor may also weaken these tissues as well as very rapid births as the tissues do not have time to stretch so they tear.  Also, after menopause, there are changes in the vaginal walls resulting from a decrease in estrogen. Estrogen helps to keep the tissues toned. Low levels of estrogen weaken the vaginal walls and may cause the bladder to shift from its normal position. As women get older, the loss of muscle tone and the relaxation of muscles may cause the uterus or other organs to drop.  Over time, conditions like chronic coughing, chronic constipation, doing a lot of heavy lifting, straining to pass stool, and obesity, can also weaken the pelvic support muscles.  Diagnosing Pelvic Support Problems Your health care provider will ask about your symptoms and do a pelvic examination. Your provider may also do a rectal exam during your pelvic exam. Your provider may ask you to: 1. Bear down and push (like you are having a bowel movement) so he or she can see if your bladder or other part of your body protrudes  into the vagina. 2. Contract the muscles of your pelvis to check the strength of your pelvic muscles.  3. Do several types of urine, nerve and muscle tests of the pelvis and around the bladder to see what type of treatment is best for you.   Symptoms Symptoms of pelvic support problems depend on the organ involved, but may include:  urine leakage  stain or fecal loss after a bowel movement trouble having bowel movements  ache in the lower abdomen, groin, or lower back  bladder infection  a feeling of heaviness, pulling, or  fullness in the pelvis, or a feeling that something is falling out of the vagina  an organ protruding from your vaginal opening  feeling the need to support the organs or perineal area to empty bladder or bowels painful sexual intercourse.  Many women feel pelvic pressure or trouble holding their urine immediately after childbirth. For some, these symptoms go away permanently, in others they return as they get older.  Treatment Options A prolapsed organ cannot repair itself. Contact your health care provider as soon as you notice symptoms of a problem. Treatment depends on what the specific problem is and how far advanced it is.  The symptoms caused by some pelvic support problems may simply be treated with changes in diet, medicine to soften the stool, weight loss, or avoiding strenuous activities. You may also do pelvic floor exercises to help strengthen your pelvic muscles.  Some cases of prolapse may require a special support device made from plastic or rubber called a pessary that fits into the vagina to support the uterus, vagina, or bladder. A pessary can also help women who leak urine when coughing, straining, or exercising. In mild cases, a tampon or vaginal diaphragm may be used instead of a pessary.  Talk to your doctor or health care provider about these options. In serious cases, surgery may be needed to put the organs back into their proper place. The uterus may be removed because of the pressure it puts on the bladder.  Your doctor will know what surgery will be best for you. How can I prevent pelvic support problems?  You can help prevent pelvic support problems by:  maintaining a healthy lifestyle  continuing to do pelvic floor exercises after you deliver a baby  maintaining a healthy weight  avoiding a lot of heavy lifting and lifting with your legs (not from your waist)  treating constipation and avoid getting

## 2023-04-20 NOTE — Therapy (Signed)
OUTPATIENT PHYSICAL THERAPY FEMALE PELVIC EVALUATION   Patient Name: Christy Young MRN: [id number] DOB:09-Oct-1945, 77 y.o., female Today's Date: 04/20/2023  END OF SESSION:  PT End of Session - 04/20/23 0851     Visit Number 3    Date for PT Re-Evaluation 05/21/23    Authorization Type Medicare    Authorization Time Period pelvic 05/21/23;    Authorization - Visit Number 3    Authorization - Number of Visits 10    PT Start Time 0845    PT Stop Time 0925    PT Time Calculation (min) 40 min    Activity Tolerance Patient tolerated treatment well    Behavior During Therapy WFL for tasks assessed/performed             Past Medical History:  Diagnosis Date   ARTHRITIS    Breast cancer (HCC)    Carpal tunnel syndrome 08/23/2015   Left   Exertional dyspnea    Fatigue    Gout, unspecified    Hyperlipidemia    Hypertension    meds since age 43    Hyperuricemia    Nonspecific abnormal electrocardiogram (ECG) (EKG)    t wave non acute     OBESITY    Palpitations    hospitalized 2005 felt from stress neg cards eval   Primary hyperparathyroidism (HCC)    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   Past Surgical History:  Procedure Laterality Date   ABDOMINAL HYSTERECTOMY     BREAST BIOPSY     right side   BREAST LUMPECTOMY WITH RADIOACTIVE SEED AND SENTINEL LYMPH NODE BIOPSY Left 06/07/2021   Procedure: LEFT BREAST LUMPECTOMY WITH RADIOACTIVE SEED X2 AND AXILLARY SENTINEL LYMPH NODE BIOPSY;  Surgeon: Manus Rudd, MD;  Location: [facility] OR;  Service: General;  Laterality: Left;   COLONOSCOPY     FACIAL COSMETIC SURGERY  1980   Patient Active Problem List   Diagnosis Date Noted   Statin myopathy 06/29/2022   Malignant neoplasm of upper-outer quadrant of left breast in female, estrogen receptor positive (HCC) 05/30/2021   Pain of left lower extremity 03/16/2021   Exertional dyspnea 03/08/2021   Decreased exercise tolerance 03/08/2021    Hyperparathyroidism (HCC) 02/09/2018   Abnormal prominence of clavicle 02/07/2018   Psoriasis 10/23/2017   Carpal tunnel syndrome 08/23/2015   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   H/O: gout 08/08/2015   Elevated blood sugar 08/08/2015   Essential hypertension 07/09/2014   Mixed hyperlipidemia 07/09/2014   Body aches 07/09/2014   Urinary frequency 07/09/2014   Numbness and tingling in left hand 07/09/2014   Visit for preventive health examination 04/11/2013   Obesity (BMI 30-39.9) 04/11/2013   Statin intolerance 04/11/2013   At risk for coronary artery disease 05/15/2012   Family history of premature coronary heart disease 04/23/2012   Fatigue 12/29/2010   Nonspecific abnormal electrocardiogram (ECG) (EKG) 12/29/2010   Hyperuricemia 12/29/2010   Palpitations    OBESITY 01/10/2010   HYPERLIPIDEMIA 11/09/2009   Gout 11/09/2009   Essential hypertension 11/09/2009   Arthropathy 11/09/2009    PCP: Madelin Headings, MD  REFERRING PROVIDER: Selmer Dominion, NP   REFERRING DIAG:  N32.81 (ICD-10-CM) - OAB (overactive bladder)  N81.10 (ICD-10-CM) - Prolapse of anterior vaginal wall  N99.3 (ICD-10-CM) - Vaginal vault prolapse after hysterectomy  R33.9 (ICD-10-CM) - Incomplete bladder emptying    THERAPY DIAG:  Muscle weakness (generalized)  Other lack of coordination  Rationale for Evaluation and  Treatment: Rehabilitation  ONSET DATE: 4/23  SUBJECTIVE:                                                                                                                                                                                           SUBJECTIVE STATEMENT: I am working on my depression. I have been doing the exercises.  Fluid intake: 12oz of coffee, 24oz water, juice, and occasional wine per day    PAIN:  Are you having pain? Yes NPRS scale: 4/10 Pain location:  low back  Pain type: aching and sharp Pain description: intermittent    Aggravating factors: sleeping, movement Relieving factors: move in bed  PRECAUTIONS: Other: breast cancer  RED FLAGS: None   WEIGHT BEARING RESTRICTIONS: No  FALLS:  Has patient fallen in last 6 months? No  LIVING ENVIRONMENT: Lives with: lives alone OCCUPATION: retired  PLOF: Independent  PATIENT GOALS: reduce urinary frequency, education on management of prolapse  PERTINENT HISTORY:  Breast cancer; Abdominal hysterectomy;   BOWEL MOVEMENT: regular bowel movements   URINATION: Pain with urination: No Fully empty bladder: Yes: until I had the test done Stream: Strong Urgency: Yes:   Frequency: Day time voids 5-6.  Nocturia: 1 times per night to void.  Leakage: non Pads: 2 liners/ mini-pads per day.   INTERCOURSE:not active  PREGNANCY: Vaginal deliveries 2   PROLAPSE: Cystocele and vaginal vault prolapse   OBJECTIVE:   DIAGNOSTIC FINDINGS:  Pelvic floor strength II/V  PVR of 70 ml was obtained by bladder scan.   COGNITION: Overall cognitive status: Within functional limits for tasks assessed       LOWER EXTREMITY ROM:  Passive ROM Right eval Left eval  Hip external rotation 40 40   (Blank rows = not tested)  LOWER EXTREMITY MMT: bilateral hip strength is 4/5.    PALPATION:   General  rib angle greater than 90 degrees; decreased movement of lower rib cage; bulge the lower abdominals with contraction                External Perineal Exam not assessed                             Internal Pelvic Floor not assessed  Patient confirms identification and approves PT to assess internal pelvic floor and treatment No  PELVIC MMT:   MMT eval  Vaginal 2/5 as per MD  (Blank rows = not tested)        TONE: Not assessed today   TODAY'S TREATMENT:     04/20/23 Exercises: Stretches/mobility: Reviewed urge to void technique and  she is using it at home Therapeutic activities: Functional strengthening activities: Educated patient on how to  breath to have a bowel movement, knees higher than hips, and relax. Dicussed with patient importance of not straining to reduce strain on the pelvic floor.  Educated patient on laying supine with legs propped up to reduce the pressure on the prolapse with pelvic floor.  Sit to stand with web space of hands into the groin, keeping the back erect to reduce the pressure on the pelvic floor    04/11/23 Neuromuscular re-education: Down training: Diaphragmatic breathing with tactile cues to not allow the breath into the chest but into the abdomen 20 x Educated patient on urge to void to reduce the urge to void and how to deter it with diaphragmatic breathing and pelvic floor contraction Exercises: Strengthening: Supine ball squeeze holding 5 sec 10 x with pelvic floor contraction Bridge with ball squeeze and pelvic floor contraction 10 x  Supine knees hip abduction with pelvic floor and yellow band 15 x Therapeutic activities: Functional strengthening activities: Rolling from supine to sitting using breath to help to reduce pressure on the prolapse Sit to stand with placing hands in the groin area and breathing out 5 x                                                                                                                            PATIENT EDUCATION: 04/11/23 Education details: Access Code: [email or web address] Person educated: Patient Education method: Programmer, multimedia, Facilities manager, Actor cues, Verbal cues, and Handouts Education comprehension: verbalized understanding, returned demonstration, verbal cues required, tactile cues required, and needs further education   HOME EXERCISE PROGRAM: 04/11/23 Access Code: [email or web address] URL: [email or web address] Date: 04/11/2023 Prepared by: Eulis Foster  Exercises - Supine Diaphragmatic Breathing  - 1 x daily - 7 x weekly - 1 sets - 10 reps - Pelvic Floor Contractions in Hooklying with Adduction  - 1 x daily - 7 x weekly - 1 sets - 10  reps - Supine Bridge with Mini Swiss Ball Between Knees  - 1 x daily - 7 x weekly - 1 sets - 10 reps - Pelvic Floor Contractions in Hooklying with Resisted Abduction  - 1 x daily - 7 x weekly - 3 sets - 10 reps    ASSESSMENT:  CLINICAL IMPRESSION: Patient is a 77 y.o. female who was seen today for physical therapy  treatment for overactive bladder and prolapse.  Patient is waking up 1 time per night. She is working on drinking more water.  Urinary leakage is 75% better. She would like today to be her last day. Patient is able to walk to the bathroom without leaking urine. Patient understands ways to have a bowel movement without straining and reducing pressure on the pelvic floor.  Patient understands ways to manage her prolapse with posture, pressure management and ways to reset the pelvic floor.   OBJECTIVE IMPAIRMENTS: decreased coordination, decreased strength, and increased fascial restrictions.   ACTIVITY  LIMITATIONS: continence, toileting, and locomotion level  PARTICIPATION LIMITATIONS: community activity  PERSONAL FACTORS: Fitness, Time since onset of injury/illness/exacerbation, and 3+ comorbidities: Breast cancer; Abdominal hysterectomy  are also affecting patient's functional outcome.   REHAB POTENTIAL: Good  CLINICAL DECISION MAKING: Stable/uncomplicated  EVALUATION COMPLEXITY: Low   GOALS: Goals reviewed with patient? Yes  SHORT TERM GOALS: Target date: 04/23/23  Patient independent with initial HEP.  Baseline: Goal status: Met 04/20/23  2.  Patient has been educated on urge to void behavioral technique.  Baseline:  Goal status: Met 04/11/23  3.  Patient is able to feel her pelvic floor contract by lifting it off the mat.  Baseline:  Goal status: Met 04/20/23    LONG TERM GOALS: Target date: 05/21/23  Patient independent with advanced HEP for core and pelvic floor strength.  Baseline:  Goal status: Met 04/20/23  2.  Patient understands ways to manage  pelvic prolapse to reduce strain on the pelvic floor.  Baseline:  Goal status: Met 04/20/23  3.  Patient is educated on correct ways to have a bowel movement to reduce strain on pelvic floor.  Baseline:  Goal status: Met 04/20/23  4.  Patient has increased pelvic floor strength so she is able to walk to the bathroom without leaking urine.  Baseline:  Goal status: Met 04/20/23  5.  Patient reports her urinary leakage decreased >/= 70% due to improved coordination of the pelvic floor muscles.  Baseline:  Goal status: Met 04/20/23   PLAN:Discharge to HEP this visit    Eulis Foster, PT 04/20/23 9:18 AM   PHYSICAL THERAPY DISCHARGE SUMMARY  Visits from Start of Care: 3  Current functional level related to goals / functional outcomes: See above.    Remaining deficits: See above.    Education / Equipment: HEP   Patient agrees to discharge. Patient goals were met. Patient is being discharged due to meeting the stated rehab goals. Thank you for the referral.   Eulis Foster, PT 04/20/23 9:18 AM

## 2023-04-23 ENCOUNTER — Ambulatory Visit: Payer: Medicare Other | Attending: Surgery

## 2023-04-23 VITALS — Wt 203.0 lb

## 2023-04-23 DIAGNOSIS — Z483 Aftercare following surgery for neoplasm: Secondary | ICD-10-CM | POA: Insufficient documentation

## 2023-04-23 NOTE — Therapy (Signed)
OUTPATIENT PHYSICAL THERAPY SOZO SCREENING NOTE   Patient Name: Christy Young MRN: [id number] DOB:14-Oct-1945, 77 y.o., female Today's Date: 04/23/2023  PCP: Madelin Headings, MD REFERRING PROVIDER: Manus Rudd, MD   PT End of Session - 04/23/23 [phone]     Visit Number 3   # unchanged due to screen only   PT Start Time 0942    PT Stop Time 0949    PT Time Calculation (min) 7 min    Activity Tolerance Patient tolerated treatment well    Behavior During Therapy [facility] for tasks assessed/performed             Past Medical History:  Diagnosis Date   ARTHRITIS    Breast cancer (HCC)    Carpal tunnel syndrome 08/23/2015   Left   Exertional dyspnea    Fatigue    Gout, unspecified    Hyperlipidemia    Hypertension    meds since age 19    Hyperuricemia    Nonspecific abnormal electrocardiogram (ECG) (EKG)    t wave non acute     OBESITY    Palpitations    hospitalized 2005 felt from stress neg cards eval   Primary hyperparathyroidism (HCC)    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   Past Surgical History:  Procedure Laterality Date   ABDOMINAL HYSTERECTOMY     BREAST BIOPSY     right side   BREAST LUMPECTOMY WITH RADIOACTIVE SEED AND SENTINEL LYMPH NODE BIOPSY Left 06/07/2021   Procedure: LEFT BREAST LUMPECTOMY WITH RADIOACTIVE SEED X2 AND AXILLARY SENTINEL LYMPH NODE BIOPSY;  Surgeon: Manus Rudd, MD;  Location: [facility] OR;  Service: General;  Laterality: Left;   COLONOSCOPY     FACIAL COSMETIC SURGERY  1980   Patient Active Problem List   Diagnosis Date Noted   Statin myopathy 06/29/2022   Malignant neoplasm of upper-outer quadrant of left breast in female, estrogen receptor positive (HCC) 05/30/2021   Pain of left lower extremity 03/16/2021   Exertional dyspnea 03/08/2021   Decreased exercise tolerance 03/08/2021   Hyperparathyroidism (HCC) 02/09/2018   Abnormal prominence of clavicle 02/07/2018   Psoriasis 10/23/2017   Carpal tunnel  syndrome 08/23/2015   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   H/O: gout 08/08/2015   Elevated blood sugar 08/08/2015   Essential hypertension 07/09/2014   Mixed hyperlipidemia 07/09/2014   Body aches 07/09/2014   Urinary frequency 07/09/2014   Numbness and tingling in left hand 07/09/2014   Visit for preventive health examination 04/11/2013   Obesity (BMI 30-39.9) 04/11/2013   Statin intolerance 04/11/2013   At risk for coronary artery disease 05/15/2012   Family history of premature coronary heart disease 04/23/2012   Fatigue 12/29/2010   Nonspecific abnormal electrocardiogram (ECG) (EKG) 12/29/2010   Hyperuricemia 12/29/2010   Palpitations    OBESITY 01/10/2010   HYPERLIPIDEMIA 11/09/2009   Gout 11/09/2009   Essential hypertension 11/09/2009   Arthropathy 11/09/2009    REFERRING DIAG: left breast cancer at risk for lymphedema  THERAPY DIAG: Aftercare following surgery for neoplasm  PERTINENT HISTORY: Patient underwent a left lumpectomy and sentinel node biopsy (1 negative node) on 06/07/2021. It is ER/PR positive and HER2 negative with a Ki67 of 40%.   PRECAUTIONS: left UE Lymphedema risk, None  SUBJECTIVE: Pt returns for her 3 month L-Dex screen. "I know my number is a bit up because my arm has been fluctuating. I wear my sleeve prn."  PAIN:  Are you having pain? No  SOZO SCREENING: Patient was assessed today using the SOZO machine to determine the lymphedema index score. This was compared to her baseline score. It was determined that she is within the recommended range when compared to her baseline and no further action is needed at this time. She will continue SOZO screenings. These are done every 3 months for 2 years post operatively followed by every 6 months for 2 years, and then annually.   L-DEX FLOWSHEETS - 04/23/23 0900       L-DEX LYMPHEDEMA SCREENING   Measurement Type Unilateral    L-DEX MEASUREMENT EXTREMITY Upper Extremity     POSITION  Standing    DOMINANT SIDE Right    At Risk Side Left    BASELINE SCORE (UNILATERAL) 2.9    L-DEX SCORE (UNILATERAL) 9.3    VALUE CHANGE (UNILAT) 6.4              Hermenia Bers, PTA 04/23/2023, 9:49 AM

## 2023-04-28 NOTE — Progress Notes (Unsigned)
Patient Care Team: [name], Neta Mends, MD as PCP - General (Internal Medicine) Rossie Muskrat, MD as Referring Physician (Rheumatology) Manus Rudd, MD as Consulting Physician (General Surgery) Malachy Mood, MD as Consulting Physician (Hematology) Lonie Peak, MD as Attending Physician (Radiation Oncology) Tyrell Antonio, MD as Consulting Physician (Physical Medicine and Rehabilitation) Elder Negus, MD as Consulting Physician (Cardiology) [facility], Konrad Dolores, MD as Attending Physician (Endocrinology)   CHIEF COMPLAINT: Follow up left breast cancer   Oncology History Overview Note   Cancer Staging  Malignant neoplasm of upper-outer quadrant of left breast in female, estrogen receptor positive [facility]) Staging form: Breast, AJCC 8th Edition - Clinical stage from 06/01/2021: Stage IIA (cT2, cN0, cM0, G3, ER+, PR+, HER2-) - Unsigned      Malignant neoplasm of upper-outer quadrant of left breast in female, estrogen receptor positive (HCC)  05/02/2021 Mammogram   Exam: 3D Mammogram Diagnostic Bilateral IMPRESSION: Palpable 2.4 cm irregular high density mass in the upper-outer left breast.  Exam: Breast Ultrasound - Left IMPRESSION: 1. 2.3 cm irregular mass in left breast at 1 o'clock posterior depth, 13 mc from nipple. 2. 0.9 cm irregular mass in left breast at 1 o'clock posterior depth, 10 cm from nipple 3. Three small 3-4 mm round masses favored to represent small cysts seen at 2:30-3:00 11-12 cm from nipple. 4. No abnormal nodes in left axilla.   05/23/2021 Initial Biopsy   Diagnosis 1. Breast, left, needle core biopsy, breast mass, 2.3 cm @ 1:00 13 CMFN  - INVASIVE DUCTAL CARCINOMA, grade 3  2. Breast, left, needle core biopsy, breast mass, 0.9 cm @ 1:00 10 CMFN  - INVASIVE DUCTAL CARCINOMA, grade 1  1. PROGNOSTIC INDICATORS Results: The tumor cells are EQUIVOCAL for Her2 (2+) Estrogen Receptor: 100%, POSITIVE, STRONG STAINING INTENSITY Progesterone  Receptor: 60%, POSITIVE, STRONG STAINING INTENSITY Proliferation Marker Ki67: 40%  1. FLUORESCENCE IN-SITU HYBRIDIZATION Results: GROUP 5: HER2 **NEGATIVE**  2. PROGNOSTIC INDICATORS Results: The tumor cells are NEGATIVE for Her2 (1+) Estrogen Receptor: 100%, POSITIVE, STRONG STAINING INTENSITY Progesterone Receptor: 100%, POSITIVE, STRONG STAINING INTENSITY Proliferation Marker Ki67: 10%   05/30/2021 Initial Diagnosis   Malignant neoplasm of upper-outer quadrant of left breast in female, estrogen receptor positive (HCC)   06/07/2021 Definitive Surgery   FINAL MICROSCOPIC DIAGNOSIS:   A. BREAST, LEFT, LUMPECTOMY:  Invasive ductal carcinomas (two independent lesions, 2.5 cm and 0.8 cm) with clear margins of resection.  Please see the synoptic report after specimen B.   B. LYMPH NODE, LEFT AXILLARY #1, SENTINEL, EXCISION:  Hyperplastic lymph node with fatty infiltration which is negative for  metastatic carcinoma.    06/07/2021 Oncotype testing   Oncotype DX was obtained on the final surgical sample and the recurrence score of 28 predicts a risk of recurrence outside the breast over the next 9 years of 18%, if the patient's only systemic therapy is an antiestrogen for 5 years.  It also predicts a significant benefit from chemotherapy.    06/07/2021 Cancer Staging   Staging form: Breast, AJCC 8th Edition - Pathologic stage from 06/07/2021: Stage IB (pT2, pN0, cM0, G3, ER+, PR+, HER2-, Oncotype DX score: 29) - Signed by Malachy Mood, MD on 08/05/2021 Stage prefix: Initial diagnosis Multigene prognostic tests performed: Oncotype DX Recurrence score range: Greater than or equal to 11 Histologic grading system: 3 grade system   07/15/2021 - 09/17/2021 Chemotherapy   Patient is on Treatment Plan : BREAST TC q21d     10/10/2021 - 11/04/2021 Radiation Therapy  Site Technique Total Dose (Gy) Dose per Fx (Gy) Completed Fx Beam Energies  Breast, Left: Breast_L 3D 40.05/40.05 2.67 15/15 10X   Breast, Left: Breast_L_Bst 3D 10/10 2 5/5 6X, 10X     11/2021 -  Anti-estrogen oral therapy   Tamoxifen      CURRENT THERAPY: Tamoxifen, starting 11/2021  INTERVAL HISTORY Christy Young returns for follow up as scheduled. Last seen by me 10/26/22. She continues tamoxifen.   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     Past Surgical History:  Procedure Laterality Date   ABDOMINAL HYSTERECTOMY     BREAST BIOPSY     right side   BREAST LUMPECTOMY WITH RADIOACTIVE SEED AND SENTINEL LYMPH NODE BIOPSY Left 06/07/2021   Procedure: LEFT BREAST LUMPECTOMY WITH RADIOACTIVE SEED X2 AND AXILLARY SENTINEL LYMPH NODE BIOPSY;  Surgeon: Manus Rudd, MD;  Location: [facility] OR;  Service: General;  Laterality: Left;   COLONOSCOPY     FACIAL COSMETIC SURGERY  1980     Outpatient Encounter Medications as of 04/30/2023  Medication Sig   allopurinol (ZYLOPRIM) 100 MG tablet TAKE 2 TABLETS BY MOUTH EVERY DAY, SCHEDULE APPOINTMENT FOR REFILLS   Cholecalciferol (VITAMIN D3) 125 MCG (5000 UT) CAPS Take 1 capsule (5,000 Units total) by mouth daily.   estradiol (ESTRACE) 0.1 MG/GM vaginal cream Place 0.5 g vaginally 2 (two) times a week. Place 0.5g nightly for two weeks then twice a week after   furosemide (LASIX) 20 MG tablet Take 20 mg by mouth as needed for fluid or edema.   Inclisiran Sodium (LEQVIO [place]) Inject into the skin.   nebivolol (BYSTOLIC) 5 MG tablet TAKE 1 TABLET DAILY   tamoxifen (NOLVADEX) 20 MG tablet TAKE 1 TABLET BY MOUTH  EVERY DAY   Trospium Chloride 60 MG CP24 Take 1 capsule (60 mg total) by mouth daily.   No facility-administered encounter medications on file as of 04/30/2023.     There were no vitals filed for this visit. There is no height or weight on file to calculate BMI.   PHYSICAL EXAM GENERAL:alert, no distress and comfortable SKIN: no rash  EYES: sclera clear NECK: without mass LYMPH:  no palpable cervical or supraclavicular lymphadenopathy  LUNGS: clear with normal breathing effort HEART: regular rate & rhythm, no lower extremity edema ABDOMEN: abdomen soft, non-tender and normal bowel sounds NEURO: alert & oriented x 3 with fluent speech, no focal motor/sensory deficits Breast exam:  PAC without erythema    CBC    Component Value Date/Time   WBC 5.0 10/26/2022 1011   WBC 5.6 04/10/2022 1052   RBC 4.22 10/26/2022 1011   HGB 12.9 10/26/2022 1011   HCT 37.8 10/26/2022 1011   PLT 230 10/26/2022 1011   MCV 89.6 10/26/2022 1011   MCH 30.6 10/26/2022 1011   MCHC 34.1 10/26/2022 1011   RDW 12.8 10/26/2022 1011   LYMPHSABS 1.7 10/26/2022  1011   MONOABS 0.5 10/26/2022 1011   EOSABS 0.1 10/26/2022 1011   BASOSABS 0.1 10/26/2022 1011     CMP     Component Value Date/Time   NA 141 02/28/2023 1516   K 4.4 02/28/2023 1516   CL 108 02/28/2023 1516   CO2 26 02/28/2023 1516   GLUCOSE 94 02/28/2023 1516   BUN 18 02/28/2023 1516   CREATININE 0.85 02/28/2023 1516   CREATININE 0.73 10/26/2022 1011   CREATININE 0.66 02/18/2020 1050   CALCIUM 11.0 (H) 02/28/2023 1516   PROT 6.3 (L) 10/26/2022 1011   ALBUMIN 4.0 02/28/2023 1516   AST 13 (L) 10/26/2022 1011   ALT 12 10/26/2022 1011   ALKPHOS 47 10/26/2022 1011   BILITOT 0.6 10/26/2022 1011   GFRNONAA >60 10/26/2022 1011     ASSESSMENT & PLAN:Christy Young is a 77 y.o. post-hysterectomy female with    1. 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 Corliss Skains showed grade 3 IDC to both tumors (2.5 cm and 0.8 cm) with small foci of DCIS. Margins and lymph node were negative. -Oncotype Dx recurrence score of 29, high risk -s/p 4 cycles adjuvant TC 07/15/21 - 09/15/21. She tolerated fairly with significant side effects. -s/p radiation under Dr. Basilio Cairo, 10/10/21 - 11/04/21. -she began tamoxifen in 11/2021, tolerating moderately well except worsening incontinence/leakage with no signs of UTI.  Kegels have not been effective.  I referred her to urogynecology -Most recent mammogram 05/2022 was negative    2. Bone Health, Hypercalcemia -Her most recent DEXA was 06/08/20 showing -0.8 (normal) -she has a history of elevated calcium, ranging between 10-12. -Followed by PCP and endocrinology Dr. Lonzo Cloud     PLAN:  No orders of the defined types were placed in this encounter.     All questions were answered. The patient knows to call the clinic with any problems, questions or concerns. No barriers to learning were detected. I spent *** counseling the patient face to face. The total time spent in the appointment was *** and more than 50% was on counseling, review of test results, and coordination of care.   Christy Glad, NP-C @DATE @

## 2023-04-30 ENCOUNTER — Encounter: Payer: Self-pay | Admitting: Nurse Practitioner

## 2023-04-30 ENCOUNTER — Inpatient Hospital Stay (HOSPITAL_BASED_OUTPATIENT_CLINIC_OR_DEPARTMENT_OTHER): Payer: Medicare Other | Admitting: Nurse Practitioner

## 2023-04-30 ENCOUNTER — Inpatient Hospital Stay: Payer: Medicare Other | Attending: Hematology

## 2023-04-30 VITALS — BP 142/78 | HR 72 | Temp 98.1°F | Resp 15 | Wt 205.3 lb

## 2023-04-30 DIAGNOSIS — Z79810 Long term (current) use of selective estrogen receptor modulators (SERMs): Secondary | ICD-10-CM | POA: Diagnosis not present

## 2023-04-30 DIAGNOSIS — C50412 Malignant neoplasm of upper-outer quadrant of left female breast: Secondary | ICD-10-CM

## 2023-04-30 DIAGNOSIS — Z170 Estrogen receptor positive status [ER+]: Secondary | ICD-10-CM | POA: Insufficient documentation

## 2023-04-30 LAB — CMP (CANCER CENTER ONLY)
ALT: 11 U/L (ref 0–44)
AST: 11 U/L — ABNORMAL LOW (ref 15–41)
Albumin: 4 g/dL (ref 3.5–5.0)
Alkaline Phosphatase: 57 U/L (ref 38–126)
Anion gap: 5 (ref 5–15)
BUN: 14 mg/dL (ref 8–23)
CO2: 27 mmol/L (ref 22–32)
Calcium: 10.9 mg/dL — ABNORMAL HIGH (ref 8.9–10.3)
Chloride: 109 mmol/L (ref 98–111)
Creatinine: 0.76 mg/dL (ref 0.44–1.00)
GFR, Estimated: >60 mL/min (ref >60)
Glucose, Bld: 94 mg/dL (ref 70–99)
Potassium: 4.1 mmol/L (ref 3.5–5.1)
Sodium: 141 mmol/L (ref 135–145)
Total Bilirubin: 0.6 mg/dL (ref <1.2)
Total Protein: 6.6 g/dL (ref 6.5–8.1)

## 2023-04-30 LAB — CBC WITH DIFFERENTIAL (CANCER CENTER ONLY)
Abs Immature Granulocytes: 0.02 10*3/uL (ref 0.00–0.07)
Basophils Absolute: 0.1 10*3/uL (ref 0.0–0.1)
Basophils Relative: 1 %
Eosinophils Absolute: 0.1 10*3/uL (ref 0.0–0.5)
Eosinophils Relative: 2 %
HCT: 39.4 % (ref 36.0–46.0)
Hemoglobin: 13 g/dL (ref 12.0–15.0)
Immature Granulocytes: 0 %
Lymphocytes Relative: 29 %
Lymphs Abs: 2 10*3/uL (ref 0.7–4.0)
MCH: 29.7 pg (ref 26.0–34.0)
MCHC: 33 g/dL (ref 30.0–36.0)
MCV: 90.2 fL (ref 80.0–100.0)
Monocytes Absolute: 0.5 10*3/uL (ref 0.1–1.0)
Monocytes Relative: 8 %
Neutro Abs: 4.2 10*3/uL (ref 1.7–7.7)
Neutrophils Relative %: 60 %
Platelet Count: 248 10*3/uL (ref 150–400)
RBC: 4.37 MIL/uL (ref 3.87–5.11)
RDW: 13 % (ref 11.5–15.5)
WBC Count: 6.9 10*3/uL (ref 4.0–10.5)
nRBC: 0 % (ref 0.0–0.2)

## 2023-05-01 ENCOUNTER — Telehealth: Payer: Self-pay | Admitting: Nurse Practitioner

## 2023-05-10 ENCOUNTER — Encounter: Payer: Self-pay | Admitting: Family Medicine

## 2023-05-10 ENCOUNTER — Ambulatory Visit (INDEPENDENT_AMBULATORY_CARE_PROVIDER_SITE_OTHER): Payer: Medicare Other | Admitting: Family Medicine

## 2023-05-10 VITALS — Ht 61.8 in | Wt 205.0 lb

## 2023-05-10 DIAGNOSIS — Z0000 Encounter for general adult medical examination without abnormal findings: Secondary | ICD-10-CM

## 2023-05-10 NOTE — Patient Instructions (Signed)
I really enjoyed getting to talk with you today! I am available on Tuesdays and Thursdays for virtual visits if you have any questions or concerns, or if I can be of any further assistance.   CHECKLIST FROM ANNUAL WELLNESS VISIT:  -Follow up (please call to schedule if not scheduled after visit):   -yearly for annual wellness visit with primary care office  Here is a list of your preventive care/health maintenance measures and the plan for each if any are due:  PLAN For any measures below that may be due:  -please get the update covid vaccine, tetanus booster and shingles vaccines at the pharmacy - bring copy of receipt to Dr. Fabian Sharp -can get the updated flu shot at the pharmacy or at the office, call to schedule  Health Maintenance  Topic Date Due   COVID-19 Vaccine (6 - 2023-24 season) 05/26/2023 (Originally 02/25/2023)   INFLUENZA VACCINE  06/13/2023 (Originally 01/25/2023)   DTaP/Tdap/Td (2 - Tdap) 11/07/2023 (Originally 03/27/2019)   Zoster Vaccines- Shingrix (1 of 2) 11/07/2023 (Originally 10/23/1964)   Medicare Annual Wellness (AWV)  05/09/2024   Pneumonia Vaccine 17+ Years old  Completed   DEXA SCAN  Completed   Hepatitis C Screening  Completed   HPV VACCINES  Aged Out   Colonoscopy  Discontinued    -See a dentist at least yearly  -Get your eyes checked and then per your eye specialist's recommendations  -Other issues addressed today:   -I have included below further information regarding a healthy whole foods based diet, physical activity guidelines for adults, stress management and opportunities for social connections. I hope you find this information useful.   -----------------------------------------------------------------------------------------------------------------------------------------------------------------------------------------------------------------------------------------------------------  NUTRITION: -eat real food: lots of colorful vegetables (half the  plate) and fruits -5-7 servings of vegetables and fruits per day (fresh or steamed is best), exp. 2 servings of vegetables with lunch and dinner and 2 servings of fruit per day. Berries and greens such as kale and collards are great choices.  -consume on a regular basis: whole grains (make sure first ingredient on label contains the word "whole"), fresh fruits, fish, nuts, seeds, healthy oils (such as olive oil, avocado oil, grape seed oil) -may eat small amounts of dairy and lean meat on occasion, but avoid processed meats such as ham, bacon, lunch meat, etc. -drink water -try to avoid fast food and pre-packaged foods, processed meat -most experts advise limiting sodium to < 2300mg  per day, should limit further is any chronic conditions such as high blood pressure, heart disease, diabetes, etc. The American Heart Association advised that < 1500mg  is is ideal -try to avoid foods that contain any ingredients with names you do not recognize  -try to avoid sugar/sweets (except for the natural sugar that occurs in fresh fruit) -try to avoid sweet drinks -try to avoid white rice, white bread, pasta (unless whole grain), white or yellow potatoes  EXERCISE GUIDELINES FOR ADULTS: -if you wish to increase your physical activity, do so gradually and with the approval of your doctor -STOP and seek medical care immediately if you have any chest pain, chest discomfort or trouble breathing when starting or increasing exercise  -move and stretch your body, legs, feet and arms when sitting for long periods -Physical activity guidelines for optimal health in adults: -least 150 minutes per week of aerobic exercise (can talk, but not sing) once approved by your doctor, 20-30 minutes of sustained activity or two 10 minute episodes of sustained activity every day.  -resistance training at least  2 days per week if approved by your doctor -balance exercises 3+ days per week:   Stand somewhere where you have something  sturdy to hold onto if you lose balance.    1) lift up on toes, start with 5x per day and work up to 20x   2) stand and lift on leg straight out to the side so that foot is a few inches of the floor, start with 5x each side and work up to 20x each side   3) stand on one foot, start with 5 seconds each side and work up to 20 seconds on each side  If you need ideas or help with getting more active:  -Silver sneakers https://tools.silversneakers.com  -Walk with a Doc: http://www.duncan-[email or web address]/  -try to include resistance (weight lifting/strength building) and balance exercises twice per week: or the following link for ideas: http://castillo-powell.com/  BuyDucts.dk  STRESS MANAGEMENT: -can try meditating, or just sitting quietly with deep breathing while intentionally relaxing all parts of your body for 5 minutes daily -if you need further help with stress, anxiety or depression please follow up with your primary doctor or contact the wonderful folks at [facility]: [phone]  SOCIAL CONNECTIONS: -options in [place] if you wish to engage in more social and exercise related activities:  -Silver sneakers https://tools.silversneakers.com  -Walk with a Doc: http://www.duncan-williams.com/  -Check out the [facility] Active Adults 50+ section on the [place] of [employer] (hiking clubs, book clubs, cards and games, chess, exercise classes, aquatic classes and much more) - see the website for details: https://www.[place].gov/departments/parks-recreation/active-adults50  -YouTube has lots of exercise videos for different ages and abilities as well  -[facility] (a variety of indoor and outdoor inperson activities for adults). [phone]. [address].  -Virtual Online Classes (a variety of topics): see seniorplanet.org or call [phone]  -consider  volunteering at a school, hospice center, church, senior center or elsewhere

## 2023-05-10 NOTE — Progress Notes (Signed)
PATIENT CHECK-IN and HEALTH RISK ASSESSMENT QUESTIONNAIRE:  -completed by phone/video for upcoming Medicare Preventive Visit  Pre-Visit Check-in: 1)Vitals (height, wt, BP, etc) - record in vitals section for visit on day of visit Request home vitals (wt, BP, etc.) and enter into vitals, THEN update Vital Signs SmartPhrase below at the top of the HPI. See below.  2)Review and Update Medications, Allergies PMH, Surgeries, Social history in Epic 3)Hospitalizations in the last year with date/reason? no  4)Review and Update Care Team (patient's specialists) in Epic 5) Complete PHQ9 in Epic  6) Complete Fall Screening in Epic 7)Review all Health Maintenance Due and order under PCP if not done.  8)Medicare Wellness Questionnaire: Answer theses question about your habits: Do you drink alcohol? yes If yes, how many drinks do you have a day?occasionally, 3 glass a week Have you ever smoked?no Quit date if applicable? N/a  How many packs a day do/did you smoke? N/a Do you use smokeless tobacco?no Do you use an illicit drugs?no Do you exercises? Yes IF so, what type and how many days/minutes per week?pelvic floor exercise every day for 10 mins, some walking - reports needs to do more, does walk when she does grocery shopping, does all of her own cleaning, sometimes does some exercise when sitting with arm weights, still gets down on the floor with her grandchild Are you sexually active? No Number of partners?n/a  Typical breakfast: oatmeal, banana, bacon/egg,grits Typical lunch: usually sandwich- salad Typical dinner- fish, beets, macaroni n cheese Typical snacks:no snacks  Beverages: mostly water and coffee  Answer theses question about you: Can you perform most household chores?yes Do you find it hard to follow a conversation in a noisy room?No Do you often ask people to speak up or repeat themselves?No Do you feel that you have a problem with memory?yes - occasionally forgets a name Do you  balance your checkbook and or bank acounts?yes Do you feel safe at home?yes Last dentist visit? September, 2023  Do you need assistance with any of the following: Please note if so No  Driving?  Feeding yourself?  Getting from bed to chair?  Getting to the toilet?  Bathing or showering?  Dressing yourself?  Managing money?  Climbing a flight of stairs - Yes  Preparing meals?  Do you have Advanced Directives in place (Living Will, Healthcare Power or Attorney)? Not Yet   Last eye Exam and location?Last month in [place]. Unable to recall the name.    Do you currently use prescribed or non-prescribed narcotic or opioid pain medications?No   Do you have a history or close family history of breast, ovarian, tubal or peritoneal cancer or a family member with BRCA (breast cancer susceptibility 1 and 2) gene mutations? No  Request home vitals (wt, BP, etc.) and enter into vitals, THEN update Vital Signs SmartPhrase below at the top of the HPI. See below.   Nurse/Assistant Credentials/time stamp: Karpuih M/CMA/3:46pm   ----------------------------------------------------------------------------------------------------------------------------------------------------------------------------------------------------------------------  Because this visit was a virtual/telehealth visit, some criteria may be missing or patient reported. Any vitals not documented were not able to be obtained and vitals that have been documented are patient reported.    MEDICARE ANNUAL PREVENTIVE VISIT WITH PROVIDER: (Welcome to Medicare, initial annual wellness or annual wellness exam)  Virtual Visit via Video Note  I connected with Christy Young on 05/10/23 by video and verified that I am speaking with the correct person using two identifiers.  Location patient: home Location provider:work or home office Persons participating  in the virtual visit: patient, provider  Concerns and/or follow up today: no new  concerns   See HM section in Epic for other details of completed HM.    ROS: negative for report of fevers, unintentional weight loss, vision changes, vision loss, hearing loss or change, chest pain, sob, hemoptysis, melena, hematochezia, hematuria, falls, bleeding or bruising, thoughts of suicide or self harm, memory loss  Patient-completed extensive health risk assessment - reviewed and discussed with the patient: See Health Risk Assessment completed with patient prior to the visit either above or in recent phone note. This was reviewed in detailed with the patient today and appropriate recommendations, orders and referrals were placed as needed per Summary below and patient instructions.   Review of Medical History: -PMH, PSH, Family History and current specialty and care providers reviewed and updated and listed below   Patient Care Team: [name], Neta Mends, MD as PCP - General (Internal Medicine) Rossie Muskrat, MD as Referring Physician (Rheumatology) Manus Rudd, MD as Consulting Physician (General Surgery) Malachy Mood, MD as Consulting Physician (Hematology) Lonie Peak, MD as Attending Physician (Radiation Oncology) Tyrell Antonio, MD as Consulting Physician (Physical Medicine and Rehabilitation) Elder Negus, MD as Consulting Physician (Cardiology) [facility], Konrad Dolores, MD as Attending Physician (Endocrinology)   Past Medical History:  Diagnosis Date   ARTHRITIS    Breast cancer [facility])    Carpal tunnel syndrome 08/23/2015   Left   Exertional dyspnea    Fatigue    Gout, unspecified    Hyperlipidemia    Hypertension    meds since age 69    Hyperuricemia    Nonspecific abnormal electrocardiogram (ECG) (EKG)    t wave non acute     OBESITY    Palpitations    hospitalized 2005 felt from stress neg cards eval   Primary hyperparathyroidism (HCC)    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    Past Surgical History:  Procedure  Laterality Date   ABDOMINAL HYSTERECTOMY     BREAST BIOPSY     right side   BREAST LUMPECTOMY WITH RADIOACTIVE SEED AND SENTINEL LYMPH NODE BIOPSY Left Jun 26, 2021   Procedure: LEFT BREAST LUMPECTOMY WITH RADIOACTIVE SEED X2 AND AXILLARY SENTINEL LYMPH NODE BIOPSY;  Surgeon: Manus Rudd, MD;  Location: [facility] OR;  Service: General;  Laterality: Left;   COLONOSCOPY     FACIAL COSMETIC SURGERY  1980    Social History   Socioeconomic History   Marital status: Single    Spouse name: Not on file   Number of children: 2   Years of education: Not on file   Highest education level: Not on file  Occupational History   Occupation: retired   Tobacco Use   Smoking status: Never   Smokeless tobacco: Never  Vaping Use   Vaping status: Never Used  Substance and Sexual Activity   Alcohol use: Yes    Alcohol/week: 3.0 standard drinks of alcohol    Types: 3 Glasses of wine per week   Drug use: No   Sexual activity: Not Currently    Birth control/protection: Surgical    Comment: Hysterectomy  Other Topics Concern   Not on file  Social History Narrative   Occupation: retired Patent examiner, 3 yrs of college   Bereaved parent   Single   Moved in 2023/01/02    No pets   G3P2   Sis died of cancer lymphoma 13  and bro now with lung cancer and spread.died 2023-06-27.  Social Determinants of Health   Financial Resource Strain: Low Risk  (05/01/2022)   Overall Financial Resource Strain (CARDIA)    Difficulty of Paying Living Expenses: Not hard at all  Food Insecurity: No Food Insecurity (05/09/2022)   Hunger Vital Sign    Worried About Running Out of Food in the Last Year: Never true    Ran Out of Food in the Last Year: Never true  Transportation Needs: No Transportation Needs (05/09/2022)   PRAPARE - Administrator, Civil Service (Medical): No    Lack of Transportation (Non-Medical): No  Physical Activity: Insufficiently Active (05/01/2022)   Exercise Vital Sign    Days of Exercise  per Week: 7 days    Minutes of Exercise per Session: 10 min  Stress: No Stress Concern Present (05/01/2022)   [employer] of Occupational Health - Occupational Stress Questionnaire    Feeling of Stress : Not at all  Social Connections: Socially Isolated (05/01/2022)   Social Connection and Isolation Panel [NHANES]    Frequency of Communication with Friends and Family: More than three times a week    Frequency of Social Gatherings with Friends and Family: More than three times a week    Attends Religious Services: Never    Database administrator or Organizations: No    Attends Banker Meetings: Never    Marital Status: Widowed  Intimate Partner Violence: Not At Risk (05/01/2022)   Humiliation, Afraid, Rape, and Kick questionnaire    Fear of Current or Ex-Partner: No    Emotionally Abused: No    Physically Abused: No    Sexually Abused: No    Family History  Problem Relation Age of Onset   Hypertension Mother    Arthritis Mother    Alcohol abuse Father        deceased   Diabetes Father    Lymphoma Sister        non hodgkins   Heart attack Brother    Lung cancer Brother    Lung cancer Brother    Prostate cancer Brother        dx after 68   Lung cancer Brother    Heart attack Son 34       1999   Hyperparathyroidism Neg Hx     Current Outpatient Medications on File Prior to Visit  Medication Sig Dispense Refill   allopurinol (ZYLOPRIM) 100 MG tablet TAKE 2 TABLETS BY MOUTH EVERY DAY, SCHEDULE APPOINTMENT FOR REFILLS 180 tablet 1   Cholecalciferol (VITAMIN D3) 125 MCG (5000 UT) CAPS Take 1 capsule (5,000 Units total) by mouth daily. 100 capsule 3   furosemide (LASIX) 20 MG tablet Take 20 mg by mouth as needed for fluid or edema.     Inclisiran Sodium (LEQVIO [place]) Inject into the skin.     nebivolol (BYSTOLIC) 5 MG tablet TAKE 1 TABLET DAILY 90 tablet 0   tamoxifen (NOLVADEX) 20 MG tablet TAKE 1 TABLET BY MOUTH EVERY DAY 90 tablet 1   No current  facility-administered medications on file prior to visit.    Allergies  Allergen Reactions   Penicillins Other (See Comments)    Patient  says she is not allergic to amoxicillin or augmentin   Has taken these wo problem ? Of SE when she was 19  Not sever        Physical Exam Vitals requested from patient and listed below if patient had equipment and was able to obtain at home for this virtual  visit: Vitals:   Estimated body mass index is 37.74 kg/m as calculated from the following:   Height as of this encounter: 5' 1.8" (1.57 m).   Weight as of this encounter: 205 lb (93 kg).  EKG (optional): deferred due to virtual visit  GENERAL: alert, oriented, no acute distress detected, full vision exam deferred due to pandemic and/or virtual encounter  HEENT: atraumatic, conjunttiva clear, no obvious abnormalities on inspection of external nose and ears  NECK: normal movements of the head and neck  LUNGS: on inspection no signs of respiratory distress, breathing rate appears normal, no obvious gross SOB, gasping or wheezing  CV: no obvious cyanosis  MS: moves all visible extremities without noticeable abnormality  PSYCH/NEURO: pleasant and cooperative, no obvious depression or anxiety, speech and thought processing grossly intact, Cognitive function grossly intact  Flowsheet Row Office Visit from 05/10/2023 in [facility] [place]  PHQ-9 Total Score 8           05/10/2023    3:29 PM 05/01/2022    4:16 PM 04/10/2022   10:17 AM 04/27/2021   11:14 AM 04/21/2020   11:26 AM  Depression screen PHQ 2/9  Decreased Interest 2 0 1 0 0  Down, Depressed, Hopeless 0 0 0 1 0  PHQ - 2 Score 2 0 1 1 0  Altered sleeping 0 0 1    Tired, decreased energy 3 0 1    Change in appetite 0 0 1    Feeling bad or failure about yourself  0 0 0    Trouble concentrating 3 0 0    Moving slowly or fidgety/restless 0 0 0    Suicidal thoughts 0 0 0    PHQ-9 Score 8 0 4     Difficult doing work/chores Not difficult at all Not difficult at all Not difficult at all    Only when in pain feels depressed, also some friends have passed, but feels is doing ok.      08/26/2021   11:01 AM 10/03/2021    8:06 AM 04/10/2022   10:18 AM 05/01/2022    4:19 PM 05/10/2023    3:29 PM  Fall Risk  Falls in the past year?   0 0 1  Was there an injury with Fall?   0 0 0  Fall Risk Category Calculator   0 0 2  Fall Risk Category (Retired)   Low Low   (RETIRED) Patient Fall Risk Level Low fall risk High fall risk Low fall risk Low fall risk   Patient at Risk for Falls Due to   No Fall Risks No Fall Risks Other (Comment)  Fall risk Follow up   Falls evaluation completed Falls prevention discussed Falls evaluation completed     SUMMARY AND PLAN:  Encounter for Medicare annual wellness exam   Discussed applicable health maintenance/preventive health measures and advised and referred or ordered per patient preferences: -she plans to get the vaccines at the pharmacy, she agrees to let us know once she gets them Health Maintenance  Topic Date Due   COVID-19 Vaccine (6 - 2023-24 season) 05/26/2023 (Originally 02/25/2023)   INFLUENZA VACCINE  06/13/2023 (Originally 01/25/2023)   DTaP/Tdap/Td (2 - Tdap) 11/07/2023 (Originally 03/27/2019)   Zoster Vaccines- Shingrix (1 of 2) 11/07/2023 (Originally 10/23/1964)   Medicare Annual Wellness (AWV)  05/09/2024   Pneumonia Vaccine 79+ Years old  Completed   DEXA SCAN  Completed   Hepatitis C Screening  Completed   HPV VACCINES  Aged Out   Colonoscopy  Discontinued    Education and counseling on the following was provided based on the above review of health and a plan/checklist for the patient, along with additional information discussed, was provided for the patient in the patient instructions :  -Advised on importance of completing advanced directives, discussed options for completing and provided information in patient instructions as  well -Provided counseling and plan for increased risk of falling if applicable per above screening. Reviewed and demonstrated safe balance exercises that can be done at home to improve balance and discussed exercise guidelines for adults with include balance exercises at least 3 days per week.  -Advised and counseled on a healthy lifestyle - including the importance of a healthy diet, regular physical activity, social connections, stress managment -Reviewed patient's current diet. Advised and counseled on a whole foods based healthy diet. A summary of a healthy diet was provided in the Patient Instructions.  -reviewed patient's current physical activity level and discussed exercise guidelines for adults. Discussed community resources and ideas for safe exercise at home to assist in meeting exercise guideline recommendations in a safe and healthy way.  -Advise yearly dental visits at minimum and regular eye exams -Advised and counseled on alcohol safe limits, risks  Follow up: see patient instructions     Patient Instructions  I really enjoyed getting to talk with you today! I am available on Tuesdays and Thursdays for virtual visits if you have any questions or concerns, or if I can be of any further assistance.   CHECKLIST FROM ANNUAL WELLNESS VISIT:  -Follow up (please call to schedule if not scheduled after visit):   -yearly for annual wellness visit with primary care office  Here is a list of your preventive care/health maintenance measures and the plan for each if any are due:  PLAN For any measures below that may be due:  -please get the update covid vaccine, tetanus booster and shingles vaccines at the pharmacy - bring copy of receipt to Dr. Fabian Sharp -can get the updated flu shot at the pharmacy or at the office, call to schedule  Health Maintenance  Topic Date Due   COVID-19 Vaccine (6 - 2023-24 season) 05/26/2023 (Originally 02/25/2023)   INFLUENZA VACCINE  06/13/2023 (Originally  01/25/2023)   DTaP/Tdap/Td (2 - Tdap) 11/07/2023 (Originally 03/27/2019)   Zoster Vaccines- Shingrix (1 of 2) 11/07/2023 (Originally 10/23/1964)   Medicare Annual Wellness (AWV)  05/09/2024   Pneumonia Vaccine 32+ Years old  Completed   DEXA SCAN  Completed   Hepatitis C Screening  Completed   HPV VACCINES  Aged Out   Colonoscopy  Discontinued    -See a dentist at least yearly  -Get your eyes checked and then per your eye specialist's recommendations  -Other issues addressed today:   -I have included below further information regarding a healthy whole foods based diet, physical activity guidelines for adults, stress management and opportunities for social connections. I hope you find this information useful.   -----------------------------------------------------------------------------------------------------------------------------------------------------------------------------------------------------------------------------------------------------------  NUTRITION: -eat real food: lots of colorful vegetables (half the plate) and fruits -5-7 servings of vegetables and fruits per day (fresh or steamed is best), exp. 2 servings of vegetables with lunch and dinner and 2 servings of fruit per day. Berries and greens such as kale and collards are great choices.  -consume on a regular basis: whole grains (make sure first ingredient on label contains the word "whole"), fresh fruits, fish, nuts, seeds, healthy oils (such as olive oil, avocado oil,  grape seed oil) -may eat small amounts of dairy and lean meat on occasion, but avoid processed meats such as ham, bacon, lunch meat, etc. -drink water -try to avoid fast food and pre-packaged foods, processed meat -most experts advise limiting sodium to < 2300mg  per day, should limit further is any chronic conditions such as high blood pressure, heart disease, diabetes, etc. The American Heart Association advised that < 1500mg  is is ideal -try to avoid  foods that contain any ingredients with names you do not recognize  -try to avoid sugar/sweets (except for the natural sugar that occurs in fresh fruit) -try to avoid sweet drinks -try to avoid white rice, white bread, pasta (unless whole grain), white or yellow potatoes  EXERCISE GUIDELINES FOR ADULTS: -if you wish to increase your physical activity, do so gradually and with the approval of your doctor -STOP and seek medical care immediately if you have any chest pain, chest discomfort or trouble breathing when starting or increasing exercise  -move and stretch your body, legs, feet and arms when sitting for long periods -Physical activity guidelines for optimal health in adults: -least 150 minutes per week of aerobic exercise (can talk, but not sing) once approved by your doctor, 20-30 minutes of sustained activity or two 10 minute episodes of sustained activity every day.  -resistance training at least 2 days per week if approved by your doctor -balance exercises 3+ days per week:   Stand somewhere where you have something sturdy to hold onto if you lose balance.    1) lift up on toes, start with 5x per day and work up to 20x   2) stand and lift on leg straight out to the side so that foot is a few inches of the floor, start with 5x each side and work up to 20x each side   3) stand on one foot, start with 5 seconds each side and work up to 20 seconds on each side  If you need ideas or help with getting more active:  -Silver sneakers https://tools.silversneakers.com  -Walk with a Doc: http://www.duncan-williams.com/  -try to include resistance (weight lifting/strength building) and balance exercises twice per week: or the following link for ideas: http://castillo-powell.com/  BuyDucts.dk  STRESS MANAGEMENT: -can try meditating, or just sitting quietly with deep breathing while intentionally relaxing  all parts of your body for 5 minutes daily -if you need further help with stress, anxiety or depression please follow up with your primary doctor or contact the wonderful folks at [facility]: [phone]  SOCIAL CONNECTIONS: -options in [place] if you wish to engage in more social and exercise related activities:  -Silver sneakers https://tools.silversneakers.com  -Walk with a Doc: http://www.duncan-williams.com/  -Check out the [facility] Active Adults 50+ section on the [place] of [employer] (hiking clubs, book clubs, cards and games, chess, exercise classes, aquatic classes and much more) - see the website for details: https://www.[place].gov/departments/parks-recreation/active-adults50  -YouTube has lots of exercise videos for different ages and abilities as well  -[facility] (a variety of indoor and outdoor inperson activities for adults). [phone]. [address].  -Virtual Online Classes (a variety of topics): see seniorplanet.org or call [phone]  -consider volunteering at a school, hospice center, church, senior center or elsewhere           Terressa Koyanagi, DO

## 2023-05-16 ENCOUNTER — Ambulatory Visit: Payer: Medicare Other

## 2023-05-16 VITALS — BP 148/80 | HR 71 | Temp 98.1°F | Resp 16 | Ht 65.0 in | Wt 208.2 lb

## 2023-05-16 DIAGNOSIS — E782 Mixed hyperlipidemia: Secondary | ICD-10-CM

## 2023-05-16 MED ORDER — INCLISIRAN SODIUM 284 MG/1.5ML ~~LOC~~ SOSY
284.0000 mg | PREFILLED_SYRINGE | Freq: Once | SUBCUTANEOUS | Status: AC
  Administered 2023-05-16: 284 mg via SUBCUTANEOUS
  Filled 2023-05-16: qty 1.5

## 2023-05-16 NOTE — Progress Notes (Signed)
Diagnosis: Hyperlipidemia  Provider:  Chilton Greathouse MD  Procedure: Injection  Leqvio, Dose: 284 mg, Site: subcutaneous, Number of injections: 1   Discharge: Condition: Good, Destination: Home . AVS Declined  Performed by:  Nat Math, RN

## 2023-06-11 ENCOUNTER — Encounter: Payer: Self-pay | Admitting: Internal Medicine

## 2023-06-11 DIAGNOSIS — Z853 Personal history of malignant neoplasm of breast: Secondary | ICD-10-CM | POA: Diagnosis not present

## 2023-06-11 LAB — HM MAMMOGRAPHY

## 2023-06-16 ENCOUNTER — Other Ambulatory Visit: Payer: Self-pay | Admitting: Internal Medicine

## 2023-06-25 DIAGNOSIS — J019 Acute sinusitis, unspecified: Secondary | ICD-10-CM | POA: Diagnosis not present

## 2023-07-18 DIAGNOSIS — Z23 Encounter for immunization: Secondary | ICD-10-CM | POA: Diagnosis not present

## 2023-07-23 ENCOUNTER — Ambulatory Visit: Payer: Medicare Other | Attending: Surgery

## 2023-07-23 VITALS — Wt 204.2 lb

## 2023-07-23 DIAGNOSIS — Z483 Aftercare following surgery for neoplasm: Secondary | ICD-10-CM | POA: Insufficient documentation

## 2023-07-23 NOTE — Therapy (Signed)
OUTPATIENT PHYSICAL THERAPY SOZO SCREENING NOTE   Patient Name: Christy Young MRN: [id number] DOB:Apr 23, 1946, 78 y.o., female Today's Date: 07/23/2023  PCP: Madelin Headings, MD REFERRING PROVIDER: Manus Rudd, MD   PT End of Session - 07/23/23 1009     Visit Number 3   # unchanged due to screen only   PT Start Time 1007    PT Stop Time 1011    PT Time Calculation (min) 4 min    Activity Tolerance Patient tolerated treatment well    Behavior During Therapy WFL for tasks assessed/performed             Past Medical History:  Diagnosis Date   ARTHRITIS    Breast cancer (HCC)    Carpal tunnel syndrome 08/23/2015   Left   Exertional dyspnea    Fatigue    Gout, unspecified    Hyperlipidemia    Hypertension    meds since age 67    Hyperuricemia    Nonspecific abnormal electrocardiogram (ECG) (EKG)    t wave non acute     OBESITY    Palpitations    hospitalized 2005 felt from stress neg cards eval   Primary hyperparathyroidism (HCC)    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   Past Surgical History:  Procedure Laterality Date   ABDOMINAL HYSTERECTOMY     BREAST BIOPSY     right side   BREAST LUMPECTOMY WITH RADIOACTIVE SEED AND SENTINEL LYMPH NODE BIOPSY Left 06/07/2021   Procedure: LEFT BREAST LUMPECTOMY WITH RADIOACTIVE SEED X2 AND AXILLARY SENTINEL LYMPH NODE BIOPSY;  Surgeon: Manus Rudd, MD;  Location: [facility];  Service: General;  Laterality: Left;   COLONOSCOPY     FACIAL COSMETIC SURGERY  1980   Patient Active Problem List   Diagnosis Date Noted   Statin myopathy 06/29/2022   Malignant neoplasm of upper-outer quadrant of left breast in female, estrogen receptor positive (HCC) 05/30/2021   Pain of left lower extremity 03/16/2021   Exertional dyspnea 03/08/2021   Decreased exercise tolerance 03/08/2021   Hyperparathyroidism (HCC) 02/09/2018   Abnormal prominence of clavicle 02/07/2018   Psoriasis 10/23/2017   Carpal tunnel  syndrome 08/23/2015   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   H/O: gout 08/08/2015   Elevated blood sugar 08/08/2015   Essential hypertension 07/09/2014   Mixed hyperlipidemia 07/09/2014   Body aches 07/09/2014   Urinary frequency 07/09/2014   Numbness and tingling in left hand 07/09/2014   Visit for preventive health examination 04/11/2013   Obesity (BMI 30-39.9) 04/11/2013   Statin intolerance 04/11/2013   At risk for coronary artery disease 05/15/2012   Family history of premature coronary heart disease 04/23/2012   Fatigue 12/29/2010   Nonspecific abnormal electrocardiogram (ECG) (EKG) 12/29/2010   Hyperuricemia 12/29/2010   Palpitations    OBESITY 01/10/2010   HYPERLIPIDEMIA 11/09/2009   Gout 11/09/2009   Essential hypertension 11/09/2009   Arthropathy 11/09/2009    REFERRING DIAG: left breast cancer at risk for lymphedema  THERAPY DIAG: Aftercare following surgery for neoplasm  PERTINENT HISTORY: Patient underwent a left lumpectomy and sentinel node biopsy (1 negative node) on 06/07/2021. It is ER/PR positive and HER2 negative with a Ki67 of 40%.   PRECAUTIONS: left UE Lymphedema risk, None  SUBJECTIVE: Pt returns for her 3 month L-Dex screen. "I've been wearing my compression sleeve more often."  PAIN:  Are you having pain? No  SOZO SCREENING: Patient was assessed today using the SOZO machine  to determine the lymphedema index score. This was compared to her baseline score. It was determined that she is within the recommended range when compared to her baseline and no further action is needed at this time. She will continue SOZO screenings. These are done every 3 months for 2 years post operatively followed by every 6 months for 2 years, and then annually.   L-DEX FLOWSHEETS - 07/23/23 1000       L-DEX LYMPHEDEMA SCREENING   Measurement Type Unilateral    L-DEX MEASUREMENT EXTREMITY Upper Extremity    POSITION  Standing    DOMINANT SIDE  Right    At Risk Side Left    BASELINE SCORE (UNILATERAL) 2.9    L-DEX SCORE (UNILATERAL) 1.3    VALUE CHANGE (UNILAT) -1.6            P: Begin 6 month L-Dex screens now.  Hermenia Bers, PTA 07/23/2023, 10:11 AM

## 2023-08-08 ENCOUNTER — Telehealth: Payer: Self-pay | Admitting: Pharmacy Technician

## 2023-08-08 NOTE — Telephone Encounter (Signed)
Auth Submission: NO AUTH NEEDED Payer: MEDICARE A/B & EMPIRE SUPP Medication & CPT/J Code(s) submitted: Leqvio (Inclisiran) J1306 Route of submission (phone, fax, portal):  Phone # Fax # Auth type: Buy/Bill Units/visits requested: X3 Reference number: [id number] (EMPIRE SUPP) Approval from: 08/08/23 - 06/25/24

## 2023-08-25 ENCOUNTER — Encounter: Payer: Self-pay | Admitting: Hematology

## 2023-08-25 ENCOUNTER — Encounter: Payer: Self-pay | Admitting: Cardiology

## 2023-08-25 ENCOUNTER — Encounter (HOSPITAL_COMMUNITY): Payer: Self-pay

## 2023-08-25 ENCOUNTER — Ambulatory Visit (HOSPITAL_COMMUNITY)
Admission: EM | Admit: 2023-08-25 | Discharge: 2023-08-25 | Disposition: A | Discharge status: Home / Self Care | Attending: Emergency Medicine | Admitting: Emergency Medicine

## 2023-08-25 ENCOUNTER — Ambulatory Visit (INDEPENDENT_AMBULATORY_CARE_PROVIDER_SITE_OTHER)

## 2023-08-25 DIAGNOSIS — R051 Acute cough: Secondary | ICD-10-CM

## 2023-08-25 DIAGNOSIS — J019 Acute sinusitis, unspecified: Secondary | ICD-10-CM

## 2023-08-25 DIAGNOSIS — H938X2 Other specified disorders of left ear: Secondary | ICD-10-CM | POA: Diagnosis not present

## 2023-08-25 DIAGNOSIS — B9689 Other specified bacterial agents as the cause of diseases classified elsewhere: Secondary | ICD-10-CM

## 2023-08-25 DIAGNOSIS — I517 Cardiomegaly: Secondary | ICD-10-CM | POA: Diagnosis not present

## 2023-08-25 DIAGNOSIS — I700 Atherosclerosis of aorta: Secondary | ICD-10-CM | POA: Diagnosis not present

## 2023-08-25 DIAGNOSIS — R059 Cough, unspecified: Secondary | ICD-10-CM | POA: Diagnosis not present

## 2023-08-25 DIAGNOSIS — R0981 Nasal congestion: Secondary | ICD-10-CM | POA: Diagnosis not present

## 2023-08-25 MED ORDER — DOXYCYCLINE HYCLATE 100 MG PO CAPS
100.0000 mg | ORAL_CAPSULE | Freq: Two times a day (BID) | ORAL | 0 refills | Status: DC
Start: 2023-08-25 — End: 2024-02-06

## 2023-08-25 MED ORDER — BENZONATATE 100 MG PO CAPS: 100.0000 mg | ORAL_CAPSULE | Freq: Three times a day (TID) | ORAL | 0 refills | Status: DC

## 2023-08-25 NOTE — ED Provider Notes (Signed)
 [facility]    CSN: [id number] Arrival date & time: 08/25/23  1450      History   Chief Complaint Chief Complaint  Patient presents with   Cough    HPI Christy Young is a 78 y.o. female.   Patient presents to clinic over concerns of sinus pain, pressure, congestion, cough that is worse at night and a headache.  She is also having bilateral ear pain with the left being more prominent than the right.  Having some neck pain below her ear as well.  She is fatigued.  Reports she is 1 year cancer free.  Does have a history of recurrent sinus infections.  Was last treated with Augmentin in December. Denies fevers.  Denies wheezing or shortness of breath.  The history is provided by the patient and medical records.  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    Patient Active Problem List   Diagnosis Date Noted   Statin myopathy 06/29/2022   Malignant neoplasm of upper-outer quadrant of left breast in female, estrogen receptor positive (HCC) 05/30/2021   Pain of left lower extremity 03/16/2021   Exertional dyspnea 03/08/2021   Decreased exercise tolerance 03/08/2021   Hyperparathyroidism (HCC) 02/09/2018   Abnormal prominence of clavicle 02/07/2018   Psoriasis 10/23/2017   Carpal tunnel syndrome 08/23/2015   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   H/O: gout 08/08/2015   Elevated blood sugar 08/08/2015   Essential hypertension 07/09/2014   Mixed hyperlipidemia 07/09/2014    Body aches 07/09/2014   Urinary frequency 07/09/2014   Numbness and tingling in left hand 07/09/2014   Visit for preventive health examination 04/11/2013   Obesity (BMI 30-39.9) 04/11/2013   Statin intolerance 04/11/2013   At risk for coronary artery disease 05/15/2012   Family history of premature coronary heart disease 04/23/2012   Fatigue 12/29/2010   Nonspecific abnormal electrocardiogram (ECG) (EKG) 12/29/2010   Hyperuricemia 12/29/2010   Palpitations    OBESITY 01/10/2010   HYPERLIPIDEMIA 11/09/2009   Gout 11/09/2009   Essential hypertension 11/09/2009   Arthropathy 11/09/2009    Past Surgical History:  Procedure Laterality Date   ABDOMINAL HYSTERECTOMY     BREAST BIOPSY     right side   BREAST LUMPECTOMY WITH RADIOACTIVE SEED AND SENTINEL LYMPH NODE BIOPSY Left 06/07/2021   Procedure: LEFT BREAST LUMPECTOMY WITH RADIOACTIVE SEED X2 AND AXILLARY SENTINEL LYMPH NODE BIOPSY;  Surgeon: Manus Rudd, MD;  Location: [facility];  Service: General;  Laterality: Left;   COLONOSCOPY     FACIAL COSMETIC SURGERY  1980    OB History     Gravida  4   Para  1   Term      Preterm      AB  2   Living  1      SAB  1   IAB  Ectopic      Multiple      Live Births               Home Medications    Prior to Admission medications   Medication Sig Start Date End Date Taking? Authorizing Provider  benzonatate (TESSALON) 100 MG capsule Take 1 capsule (100 mg total) by mouth every 8 (eight) hours. 08/25/23  Yes Rinaldo Ratel, [place] N, FNP  Cholecalciferol (VITAMIN D3) 125 MCG (5000 UT) CAPS Take 1 capsule (5,000 Units total) by mouth daily. 09/22/21  Yes Romero Belling, MD  doxycycline (VIBRAMYCIN) 100 MG capsule Take 1 capsule (100 mg total) by mouth 2 (two) times daily. 08/25/23  Yes Rinaldo Ratel, [place] N, FNP  furosemide (LASIX) 20 MG tablet Take 20 mg by mouth as needed for fluid or edema.   Yes [provider]  nebivolol (BYSTOLIC) 5 MG tablet TAKE 1 TABLET DAILY  06/16/23  Yes Worthy Rancher B, FNP  tamoxifen (NOLVADEX) 20 MG tablet TAKE 1 TABLET BY MOUTH EVERY DAY 04/09/23  Yes Malachy Mood, MD    Family History Family History  Problem Relation Age of Onset   Hypertension Mother    Arthritis Mother    Alcohol abuse Father        deceased   Diabetes Father    Lymphoma Sister        non hodgkins   Heart attack Brother    Lung cancer Brother    Lung cancer Brother    Prostate cancer Brother        dx after 33   Lung cancer Brother    Heart attack Son 36       1999   Hyperparathyroidism Neg Hx     Social History Social History   Tobacco Use   Smoking status: Never   Smokeless tobacco: Never  Vaping Use   Vaping status: Never Used  Substance Use Topics   Alcohol use: Yes    Alcohol/week: 3.0 standard drinks of alcohol    Types: 3 Glasses of wine per week   Drug use: No     Allergies   Penicillins   Review of Systems Review of Systems  Per HPI   Physical Exam Triage Vital Signs ED Triage Vitals  Encounter Vitals Group     BP 08/25/23 1528 132/82     Systolic BP Percentile --      Diastolic BP Percentile --      Pulse Rate 08/25/23 1528 77     Resp 08/25/23 1528 16     Temp 08/25/23 1528 98.5 F (36.9 C)     Temp Source 08/25/23 1528 Oral     SpO2 08/25/23 1528 94 %     Weight 08/25/23 1528 204 lb (92.5 kg)     Height 08/25/23 1528 5\' 4"  (1.626 m)     Head Circumference --      Peak Flow --      Pain Score 08/25/23 1525 7     Pain Loc --      Pain Education --      Exclude from Growth Chart --    No data found.  Updated Vital Signs BP 132/82 (BP Location: Right Arm)   Pulse 77   Temp 98.5 F (36.9 C) (Oral)   Resp 16   Ht 5\' 4"  (1.626 m)   Wt 204 lb (92.5 kg)   SpO2 94%   BMI 35.02 kg/m   Visual Acuity Right Eye Distance:   Left Eye  Distance:   Bilateral Distance:    Right Eye Near:   Left Eye Near:    Bilateral Near:     Physical Exam Vitals and nursing note reviewed.  Constitutional:       Appearance: Normal appearance.  HENT:     Head: Normocephalic and atraumatic.     Right Ear: External ear normal. A middle ear effusion is present.     Left Ear: External ear normal. A middle ear effusion is present.     Nose: Nose normal.     Mouth/Throat:     Mouth: Mucous membranes are moist.  Eyes:     Conjunctiva/sclera: Conjunctivae normal.  Cardiovascular:     Rate and Rhythm: Normal rate and regular rhythm.     Heart sounds: Normal heart sounds. No murmur heard. Pulmonary:     Effort: Pulmonary effort is normal. No respiratory distress.     Breath sounds: Normal breath sounds.  Musculoskeletal:        General: Normal range of motion.  Skin:    General: Skin is warm and dry.  Neurological:     General: No focal deficit present.     Mental Status: She is alert and oriented to person, place, and time.  Psychiatric:        Mood and Affect: Mood normal.        Behavior: Behavior normal. Behavior is cooperative.      UC Treatments / Results  Labs (all labs ordered are listed, but only abnormal results are displayed) Labs Reviewed - No data to display  EKG   Radiology No results found.  Procedures Procedures (including critical care time)  Medications Ordered in UC Medications - No data to display  Initial Impression / Assessment and Plan / UC Course  I have reviewed the triage vital signs and the nursing notes.  Pertinent labs & imaging results that were available during my care of the patient were reviewed by me and considered in my medical decision making (see chart for details).  Vitals and triage reviewed, patient is hemodynamically stable.  Lungs are vesicular, heart with regular rate and rhythm.  Bilateral middle ear effusions and maxillary sinus tenderness.  Symptoms have been present for 2 weeks, recent Augmentin use in December.  Chest x-ray by my interpretation does not show any obvious infiltrates.  Heart does appear enlarged.  Will cover with  doxycycline for acute bacterial sinusitis.  Cough management reviewed.  Suspect headache is related to sinusitis, encouraged PCP follow-up for further investigation into headache if it persist, she is a cancer survivor.  Plan of care, follow-up care return precautions given, no questions at this time.     Final Clinical Impressions(s) / UC Diagnoses   Final diagnoses:  Acute cough  Acute bacterial sinusitis  Sensation of fullness in left ear     Discharge Instructions      Take the antibiotics twice daily with food to treat your infection.  You can use the cough medicine every 8 hours.  Continue with the Mucinex, this will help loosen up your secretions.  Ensure you are staying well-hydrated.  Sleep with a humidifier may help as well.  If your sinus infections persist, consider following up with a ENT.  If your headache persists despite treatment of your sinus infection, follow-up with your primary care provider for further investigation.  Return to clinic for new or urgent symptoms.     ED Prescriptions     Medication Sig Dispense Auth. Provider  benzonatate (TESSALON) 100 MG capsule Take 1 capsule (100 mg total) by mouth every 8 (eight) hours. 21 capsule Rinaldo Ratel, [place] N, [place]   doxycycline (VIBRAMYCIN) 100 MG capsule Take 1 capsule (100 mg total) by mouth 2 (two) times daily. 20 capsule Marah Park, [place] N, [place]      PDMP not reviewed this encounter.   Jebidiah Baggerly, [place] N, [place] 08/25/23 [phone]

## 2023-08-25 NOTE — ED Triage Notes (Signed)
 Chief Complaint: Sinus congestion, productive cough with chest congestion that is worse at night, headache going down the neck. Patient also having fatigue. Patient is a cancer survivor. Patient also has a history of sinus infections.   Sick exposure: No  Onset: 2 weeks ago  Prescriptions or OTC medications tried: Yes- cough syrup, Mucinex sinus    with mild relief  New foods, medications, or products: No  Recent Travel: Yes- [place] in December 2024.

## 2023-08-25 NOTE — Discharge Instructions (Addendum)
 Take the antibiotics twice daily with food to treat your infection.  You can use the cough medicine every 8 hours.  Continue with the Mucinex, this will help loosen up your secretions.  Ensure you are staying well-hydrated.  Sleep with a humidifier may help as well.  If your sinus infections persist, consider following up with a ENT.  If your headache persists despite treatment of your sinus infection, follow-up with your primary care provider for further investigation.  Return to clinic for new or urgent symptoms.

## 2023-09-06 DIAGNOSIS — K219 Gastro-esophageal reflux disease without esophagitis: Secondary | ICD-10-CM | POA: Diagnosis not present

## 2023-09-11 ENCOUNTER — Telehealth: Payer: Self-pay | Admitting: Nurse Practitioner

## 2023-09-11 NOTE — Telephone Encounter (Signed)
 Received patient voicemail on 09/11/2023 in regards to rescheduling May 2025 appointments; left patient a voicemail with callback number for scheduling if patient is still needing to reschedule appointments

## 2023-09-14 ENCOUNTER — Other Ambulatory Visit: Payer: Self-pay | Admitting: Family

## 2023-10-04 ENCOUNTER — Other Ambulatory Visit: Payer: Self-pay | Admitting: Internal Medicine

## 2023-10-04 ENCOUNTER — Other Ambulatory Visit: Payer: Self-pay

## 2023-10-04 NOTE — Telephone Encounter (Signed)
 Copied from CRM [id number]. Topic: Clinical - Medication Refill >> Oct 04, 2023  5:07 PM DeAngela L wrote: Most Recent Primary Care Visit:  Provider: Terressa Koyanagi  Department: [facility]  Visit Type: MEDICARE AWV, SEQUENTIAL  Date: 05/10/2023  Medication: nebivolol (BYSTOLIC) 5 MG tablet   Has the patient contacted their pharmacy? Yes  (Agent: If no, request that the patient contact the pharmacy for the refill. If patient does not wish to contact the pharmacy document the reason why and proceed with request.) (Agent: If yes, when and what did the pharmacy advise?)  Is this the correct pharmacy for this prescription? Yes  If no, delete pharmacy and type the correct one.  This is the patient's preferred pharmacy:   [facility] [place], [place] - [address] AT Portal to Registered Caremark Sites [address] [place] [address] Phone: [phone] Fax: [phone]   Has the prescription been filled recently? Yes   Is the patient out of the medication? Yes   Has the patient been seen for an appointment in the last year OR does the patient have an upcoming appointment? Yes   Can we respond through MyChart? No   Agent: Please be advised that Rx refills may take up to 3 business days. We ask that you follow-up with your pharmacy.

## 2023-10-04 NOTE — Telephone Encounter (Signed)
 Copied from CRM [id number]. Topic: Clinical - Medication Refill >> Oct 04, 2023  5:07 PM DeAngela L wrote: Most Recent Primary Care Visit:  Provider: Terressa Koyanagi  Department: [facility]  Visit Type: MEDICARE AWV, SEQUENTIAL  Date: 05/10/2023  Medication: nebivolol (BYSTOLIC) 5 MG tablet   Has the patient contacted their pharmacy? Yes  (Agent: If no, request that the patient contact the pharmacy for the refill. If patient does not wish to contact the pharmacy document the reason why and proceed with request.) (Agent: If yes, when and what did the pharmacy advise?)  Is this the correct pharmacy for this prescription? Yes  If no, delete pharmacy and type the correct one.  This is the patient's preferred pharmacy:   [facility] - [place], [place] - [address] [facility] AT Portal to Registered Caremark Sites [address] [place] [address] Phone: [phone] Fax: [phone]   Has the prescription been filled recently? Yes   Is the patient out of the medication? Yes   Has the patient been seen for an appointment in the last year OR does the patient have an upcoming appointment? Yes   Can we respond through MyChart? No   Agent: Please be advised that Rx refills may take up to 3 business days. We ask that you follow-up with your pharmacy. >> Oct 04, 2023  5:11 PM DeAngela L wrote: Lavonne Chick phone number [phone] option #2  This could be a verbal refill request

## 2023-10-08 ENCOUNTER — Other Ambulatory Visit: Payer: Self-pay | Admitting: Internal Medicine

## 2023-10-08 ENCOUNTER — Telehealth: Payer: Self-pay

## 2023-10-08 NOTE — Telephone Encounter (Signed)
 Attempted to call pt. Left a detail that pt has not seen with Dr. Ethel Henry over a year and to schedule an appointment to get medication refill.

## 2023-10-08 NOTE — Telephone Encounter (Signed)
 Copied from CRM [id number]. Topic: Clinical - Medication Refill >> Oct 08, 2023 10:04 AM Rosamond Comes wrote: Patient calling in requesting  medication refill  Most Recent Primary Care Visit:  Provider: Maurie Southern  Department: [facility]  Visit Type: MEDICARE AWV, SEQUENTIAL  Date: 05/10/2023  Medication: nebivolol (BYSTOLIC) 5 MG tablet   Has the patient contacted their pharmacy? No (Agent: If no, request that the patient contact the pharmacy for the refill. If patient does not wish to contact the pharmacy document the reason why and proceed with request.) (Agent: If yes, when and what did the pharmacy advise?)  Is this the correct pharmacy for this prescription? Yes If no, delete pharmacy and type the correct one.  This is the patient's preferred pharmacy:   [facility] - [place], [place] - One [facility] AT Portal to Registered Caremark Sites One [place] [address] Phone: [phone] Fax: [phone]   Has the prescription been filled recently? Yes  Is the patient out of the medication? No  Has the patient been seen for an appointment in the last year OR does the patient have an upcoming appointment? Yes  Can we respond through MyChart? No  Agent: Please be advised that Rx refills may take up to 3 business days. We ask that you follow-up with your pharmacy.

## 2023-10-12 ENCOUNTER — Other Ambulatory Visit: Payer: Self-pay | Admitting: Hematology

## 2023-11-05 ENCOUNTER — Other Ambulatory Visit: Payer: Medicare Other

## 2023-11-05 ENCOUNTER — Ambulatory Visit: Payer: Medicare Other | Admitting: Nurse Practitioner

## 2023-11-11 ENCOUNTER — Other Ambulatory Visit: Payer: Self-pay | Admitting: Nurse Practitioner

## 2023-11-11 DIAGNOSIS — C50412 Malignant neoplasm of upper-outer quadrant of left female breast: Secondary | ICD-10-CM

## 2023-11-11 NOTE — Progress Notes (Unsigned)
 Patient Care Team: [name], Joaquim Muir, MD as PCP - General (Internal Medicine) Alvester Aw, MD as Referring Physician (Rheumatology) Dareen Ebbing, MD as Consulting Physician (General Surgery) Sonja Orleans, MD as Consulting Physician (Hematology) Colie Dawes, MD as Attending Physician (Radiation Oncology) Bridget Campion, MD as Consulting Physician (Physical Medicine and Rehabilitation) Cody Das, MD as Consulting Physician (Cardiology) [facility], Julian Obey, MD as Attending Physician (Endocrinology)  Clinic Day:  11/20/2023  Referring physician: Reginal Capra, MD  ASSESSMENT & PLAN:   Assessment & Plan: Malignant neoplasm of upper-outer quadrant of left breast in female, estrogen receptor positive (HCC) multifocal invasive ductal carcinoma, Stage IIA, 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 Eli Grizzle showed grade 3 IDC to both tumors (2.5 cm and 0.8 cm) with small foci of DCIS. Margins and lymph node were negative. -Oncotype Dx recurrence score of 29, high risk -s/p 4 cycles adjuvant TC 07/15/21 - 09/15/21. She tolerated fairly with significant side effects. -s/p radiation under Dr. Lurena Sally, 10/10/21 - 11/04/21. -she began tamoxifen  in 11/2021, tolerating moderately well except worsening incontinence/leakage with no signs of UTI; she follows with urogynecology -Most recent mammogram 06/11/2023 with benign results.  -continue tamoxifen  daily.  -continue breast cancer surveillance. Labs and follow up in 6 months and sooner if needed.    New, left-sided headache Reports new and daily left-sided headache.  This is now radiating to the left side of her neck and shoulder.  She also reports increased fatigue.  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  Her niece ultimately passed away from the leptomeningeal carcinoma.  Patient's concerned she may be developing the same  disease.  Will get new MRI of the brain for further evaluation.   Plan: Labs reviewed.  - CBC and CMP are stable and unremarkable. Will get MRI of the brain with and without contrast to evaluate new and daily left-sided headaches.  Will contact patient with results when they are available. Continue tamoxifen  daily. Labs and follow-up in 6 months, sooner if needed.  The patient understands the plans discussed today and is in agreement with them.  She knows to contact our office if she develops concerns prior to her next appointment.  I provided 30 minutes of face-to-face time during this encounter and > 50% was spent counseling as documented under my assessment and plan.    Sharyon Deis, NP  [facility] - A DEPT OF [facility] [address] [place] [address] Dept: [phone] Dept Fax: [phone]   Orders Placed This Encounter  Procedures   MR Brain W Wo Contrast    Standing Status:   Future    Expected Date:   11/26/2023    Expiration Date:   11/11/2024    If indicated for the ordered procedure, I authorize the administration of contrast media per Radiology protocol:   Yes    What is the patient's sedation requirement?:   No Sedation    Does the patient have a pacemaker or implanted devices?:   No    Use SRS Protocol?:   Yes    Preferred imaging location?:   [facility] (table limit - 550 lbs)      CHIEF COMPLAINT:  CC: left breast cancer, estrogen receptor positive.   Current Treatment:  tamoxifen  20 mg daily - started 11/2021  INTERVAL HISTORY:  Christy Young is  here today for repeat clinical assessment. She was last seen by Lacie, NP on 04/30/2023. Most recent mammogram done 06/11/2023. Results were benign.  She is reporting new, daily headache headaches past 3 months.  Starting to radiate to the left side of her neck and shoulder.  Concerned about development of leptomeningeal carcinoma.  Was  ultimately killed her niece, who also had breast cancer. She denies chest pain, chest pressure, or shortness of breath. She denies abdominal pain, nausea, vomiting, or changes in bowel or bladder habits.    She denies fevers or chills. She denies pain. Her appetite is good. Her weight has decreased 3 pounds over last 3 months.  I have reviewed the past medical history, past surgical history, social history and family history with the patient and they are unchanged from previous note.  ALLERGIES:  is allergic to penicillins.  MEDICATIONS:  Current Outpatient Medications  Medication Sig Dispense Refill   Cholecalciferol (VITAMIN D3) 125 MCG (5000 UT) CAPS Take 1 capsule (5,000 Units total) by mouth daily. 100 capsule 3   doxycycline  (VIBRAMYCIN ) 100 MG capsule Take 1 capsule (100 mg total) by mouth 2 (two) times daily. 20 capsule 0   furosemide  (LASIX ) 20 MG tablet Take 20 mg by mouth as needed for fluid or edema.     nebivolol  (BYSTOLIC ) 5 MG tablet TAKE 1 TABLET DAILY 90 tablet 0   tamoxifen  (NOLVADEX ) 20 MG tablet TAKE 1 TABLET BY MOUTH EVERY DAY 90 tablet 1   No current facility-administered medications for this visit.    HISTORY OF PRESENT ILLNESS:   Oncology History Overview Note   Cancer Staging  Malignant neoplasm of upper-outer quadrant of left breast in female, estrogen receptor positive (HCC) Staging form: Breast, AJCC 8th Edition - Clinical stage from 06/01/2021: Stage IIA (cT2, cN0, cM0, G3, ER+, PR+, HER2-) - Unsigned      Malignant neoplasm of upper-outer quadrant of left breast in female, estrogen receptor positive (HCC)  05/02/2021 Mammogram   Exam: 3D Mammogram Diagnostic Bilateral IMPRESSION: Palpable 2.4 cm irregular high density mass in the upper-outer left breast.  Exam: Breast Ultrasound - Left IMPRESSION: 1. 2.3 cm irregular mass in left breast at 1 o'clock posterior depth, 13 mc from nipple. 2. 0.9 cm irregular mass in left breast at 1 o'clock posterior  depth, 10 cm from nipple 3. Three small 3-4 mm round masses favored to represent small cysts seen at 2:30-3:00 11-12 cm from nipple. 4. No abnormal nodes in left axilla.   05/23/2021 Initial Biopsy   Diagnosis 1. Breast, left, needle core biopsy, breast mass, 2.3 cm @ 1:00 13 CMFN  - INVASIVE DUCTAL CARCINOMA, grade 3  2. Breast, left, needle core biopsy, breast mass, 0.9 cm @ 1:00 10 CMFN  - INVASIVE DUCTAL CARCINOMA, grade 1  1. PROGNOSTIC INDICATORS Results: The tumor cells are EQUIVOCAL for Her2 (2+) Estrogen Receptor: 100%, POSITIVE, STRONG STAINING INTENSITY Progesterone Receptor: 60%, POSITIVE, STRONG STAINING INTENSITY Proliferation Marker Ki67: 40%  1. FLUORESCENCE IN-SITU HYBRIDIZATION Results: GROUP 5: HER2 **NEGATIVE**  2. PROGNOSTIC INDICATORS Results: The tumor cells are NEGATIVE for Her2 (1+) Estrogen Receptor: 100%, POSITIVE, STRONG STAINING INTENSITY Progesterone Receptor: 100%, POSITIVE, STRONG STAINING INTENSITY Proliferation Marker Ki67: 10%   05/30/2021 Initial Diagnosis   Malignant neoplasm of upper-outer quadrant of left breast in female, estrogen receptor positive (HCC)   06/07/2021 Definitive Surgery   FINAL MICROSCOPIC DIAGNOSIS:   A. BREAST, LEFT, LUMPECTOMY:  Invasive ductal carcinomas (two independent lesions, 2.5 cm and 0.8 cm) with clear  margins of resection.  Please see the synoptic report after specimen B.   B. LYMPH NODE, LEFT AXILLARY #1, SENTINEL, EXCISION:  Hyperplastic lymph node with fatty infiltration which is negative for  metastatic carcinoma.    06/07/2021 Oncotype testing   Oncotype DX was obtained on the final surgical sample and the recurrence score of 28 predicts a risk of recurrence outside the breast over the next 9 years of 18%, if the patient's only systemic therapy is an antiestrogen for 5 years.  It also predicts a significant benefit from chemotherapy.    06/07/2021 Cancer Staging   Staging form: Breast, AJCC 8th  Edition - Pathologic stage from 06/07/2021: Stage IB (pT2, pN0, cM0, G3, ER+, PR+, HER2-, Oncotype DX score: 29) - Signed by Sonja Condon, MD on 08/05/2021 Stage prefix: Initial diagnosis Multigene prognostic tests performed: Oncotype DX Recurrence score range: Greater than or equal to 11 Histologic grading system: 3 grade system   07/15/2021 - 09/17/2021 Chemotherapy   Patient is on Treatment Plan : BREAST TC q21d     10/10/2021 - 11/04/2021 Radiation Therapy   Site Technique Total Dose (Gy) Dose per Fx (Gy) Completed Fx Beam Energies  Breast, Left: Breast_L 3D 40.05/40.05 2.67 15/15 10X  Breast, Left: Breast_L_Bst 3D 10/10 2 5/5 6X, 10X     11/2021 -  Anti-estrogen oral therapy   Tamoxifen        REVIEW OF SYSTEMS:   Constitutional: Denies fevers, chills or abnormal weight loss Eyes: Denies blurriness of vision Ears, nose, mouth, throat, and face: Denies mucositis or sore throat Respiratory: Denies cough, dyspnea or wheezes Cardiovascular: Denies palpitation, chest discomfort or lower extremity swelling Gastrointestinal:  Denies nausea, heartburn or change in bowel habits Skin: Denies abnormal skin rashes Lymphatics: Denies new lymphadenopathy or easy bruising Neurological:Denies numbness, tingling or new weaknesses.  Is having daily left-sided headaches which now radiate to her left shoulder and left side of her neck. Behavioral/Psych: Mood is stable, no new changes  All other systems were reviewed with the patient and are negative.   VITALS:   Today's Vitals   11/12/23 1204 11/12/23 1238  BP: (!) 146/78   Pulse: 80   Resp: 16   Temp: 98 F (36.7 C)   TempSrc: Temporal   SpO2: 99%   Weight: 201 lb 8 oz (91.4 kg)   PainSc:  5    Body mass index is 34.59 kg/m.   Wt Readings from Last 3 Encounters:  11/13/23 201 lb 6.4 oz (91.4 kg)  11/12/23 201 lb 8 oz (91.4 kg)  08/25/23 204 lb (92.5 kg)    Body mass index is 34.59 kg/m.  Performance status (ECOG): 1 -  Symptomatic but completely ambulatory  PHYSICAL EXAM:   GENERAL:alert, no distress and comfortable SKIN: skin color, texture, turgor are normal, no rashes or significant lesions EYES: normal, Conjunctiva are pink and non-injected, sclera clear OROPHARYNX:no exudate, no erythema and lips, buccal mucosa, and tongue normal  NECK: supple, thyroid  normal size, non-tender, without nodularity LYMPH:  no palpable lymphadenopathy in the cervical, axillary or inguinal LUNGS: clear to auscultation and percussion with normal breathing effort HEART: regular rate & rhythm and no murmurs and no lower extremity edema ABDOMEN:abdomen soft, non-tender and normal bowel sounds Musculoskeletal:no cyanosis of digits and no clubbing  NEURO: alert & oriented x 3 with fluent speech, no focal motor/sensory deficits BREAST: Left lumpectomy scar completely healed.  Expected radiation skin changes.  No palpable masses or lumps in the left breast appreciated today.  There is no nipple inversion or nipple discharge.  No axillary lymphadenopathy on the left side.  The right breast is without palpable mass or lump today.  There is no nipple inversion or nipple discharge.  There is no axillary lymphadenopathy on the right.  LABORATORY DATA:  I have reviewed the data as listed    Component Value Date/Time   NA 143 11/12/2023 1109   K 3.8 11/12/2023 1109   CL 112 (H) 11/12/2023 1109   CO2 26 11/12/2023 1109   GLUCOSE 93 11/12/2023 1109   BUN 19 11/12/2023 1109   CREATININE 0.77 11/12/2023 1109   CREATININE 0.66 02/18/2020 1050   CALCIUM  10.5 (H) 11/12/2023 1109   PROT 6.6 11/12/2023 1109   ALBUMIN 4.1 11/12/2023 1109   AST 14 (L) 11/12/2023 1109   ALT 15 11/12/2023 1109   ALKPHOS 46 11/12/2023 1109   BILITOT 0.5 11/12/2023 1109   GFRNONAA >60 11/12/2023 1109     Lab Results  Component Value Date   WBC 5.7 11/12/2023   NEUTROABS 3.4 11/12/2023   HGB 13.0 11/12/2023   HCT 38.4 11/12/2023   MCV 87.7  11/12/2023   PLT 239 11/12/2023

## 2023-11-11 NOTE — Assessment & Plan Note (Addendum)
 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 Eli Grizzle showed grade 3 IDC to both tumors (2.5 cm and 0.8 cm) with small foci of DCIS. Margins and lymph node were negative. -Oncotype Dx recurrence score of 29, high risk -s/p 4 cycles adjuvant TC 07/15/21 - 09/15/21. She tolerated fairly with significant side effects. -s/p radiation under Dr. Lurena Sally, 10/10/21 - 11/04/21. -she began tamoxifen  in 11/2021, tolerating moderately well except worsening incontinence/leakage with no signs of UTI; she follows with urogynecology -Most recent mammogram 06/11/2023 with benign results.  -continue tamoxifen  daily.  -continue breast cancer surveillance. Labs and follow up in 6 months and sooner if needed.

## 2023-11-12 ENCOUNTER — Inpatient Hospital Stay (HOSPITAL_BASED_OUTPATIENT_CLINIC_OR_DEPARTMENT_OTHER): Admitting: Nurse Practitioner

## 2023-11-12 ENCOUNTER — Inpatient Hospital Stay: Attending: Nurse Practitioner

## 2023-11-12 VITALS — BP 146/78 | HR 80 | Temp 98.0°F | Resp 16 | Wt 201.5 lb

## 2023-11-12 DIAGNOSIS — Z1721 Progesterone receptor positive status: Secondary | ICD-10-CM | POA: Diagnosis not present

## 2023-11-12 DIAGNOSIS — R519 Headache, unspecified: Secondary | ICD-10-CM

## 2023-11-12 DIAGNOSIS — C50412 Malignant neoplasm of upper-outer quadrant of left female breast: Secondary | ICD-10-CM | POA: Insufficient documentation

## 2023-11-12 DIAGNOSIS — Z1732 Human epidermal growth factor receptor 2 negative status: Secondary | ICD-10-CM | POA: Insufficient documentation

## 2023-11-12 DIAGNOSIS — Z923 Personal history of irradiation: Secondary | ICD-10-CM | POA: Insufficient documentation

## 2023-11-12 DIAGNOSIS — Z170 Estrogen receptor positive status [ER+]: Secondary | ICD-10-CM | POA: Insufficient documentation

## 2023-11-12 DIAGNOSIS — Z79810 Long term (current) use of selective estrogen receptor modulators (SERMs): Secondary | ICD-10-CM | POA: Insufficient documentation

## 2023-11-12 LAB — CMP (CANCER CENTER ONLY)
ALT: 15 U/L (ref 0–44)
AST: 14 U/L — ABNORMAL LOW (ref 15–41)
Albumin: 4.1 g/dL (ref 3.5–5.0)
Alkaline Phosphatase: 46 U/L (ref 38–126)
Anion gap: 5 (ref 5–15)
BUN: 19 mg/dL (ref 8–23)
CO2: 26 mmol/L (ref 22–32)
Calcium: 10.5 mg/dL — ABNORMAL HIGH (ref 8.9–10.3)
Chloride: 112 mmol/L — ABNORMAL HIGH (ref 98–111)
Creatinine: 0.77 mg/dL (ref 0.44–1.00)
GFR, Estimated: >60 mL/min (ref >60)
Glucose, Bld: 93 mg/dL (ref 70–99)
Potassium: 3.8 mmol/L (ref 3.5–5.1)
Sodium: 143 mmol/L (ref 135–145)
Total Bilirubin: 0.5 mg/dL (ref 0.0–1.2)
Total Protein: 6.6 g/dL (ref 6.5–8.1)

## 2023-11-12 LAB — CBC WITH DIFFERENTIAL (CANCER CENTER ONLY)
Abs Immature Granulocytes: 0.01 10*3/uL (ref 0.00–0.07)
Basophils Absolute: 0 10*3/uL (ref 0.0–0.1)
Basophils Relative: 1 %
Eosinophils Absolute: 0.1 10*3/uL (ref 0.0–0.5)
Eosinophils Relative: 2 %
HCT: 38.4 % (ref 36.0–46.0)
Hemoglobin: 13 g/dL (ref 12.0–15.0)
Immature Granulocytes: 0 %
Lymphocytes Relative: 32 %
Lymphs Abs: 1.8 10*3/uL (ref 0.7–4.0)
MCH: 29.7 pg (ref 26.0–34.0)
MCHC: 33.9 g/dL (ref 30.0–36.0)
MCV: 87.7 fL (ref 80.0–100.0)
Monocytes Absolute: 0.4 10*3/uL (ref 0.1–1.0)
Monocytes Relative: 7 %
Neutro Abs: 3.4 10*3/uL (ref 1.7–7.7)
Neutrophils Relative %: 58 %
Platelet Count: 239 10*3/uL (ref 150–400)
RBC: 4.38 MIL/uL (ref 3.87–5.11)
RDW: 13.4 % (ref 11.5–15.5)
WBC Count: 5.7 10*3/uL (ref 4.0–10.5)
nRBC: 0 % (ref 0.0–0.2)

## 2023-11-13 ENCOUNTER — Ambulatory Visit (INDEPENDENT_AMBULATORY_CARE_PROVIDER_SITE_OTHER): Payer: Medicare Other

## 2023-11-13 VITALS — BP 143/84 | HR 75 | Temp 98.5°F | Resp 16 | Ht 65.0 in | Wt 201.4 lb

## 2023-11-13 DIAGNOSIS — E782 Mixed hyperlipidemia: Secondary | ICD-10-CM | POA: Diagnosis not present

## 2023-11-13 MED ORDER — INCLISIRAN SODIUM 284 MG/1.5ML ~~LOC~~ SOSY
284.0000 mg | PREFILLED_SYRINGE | Freq: Once | SUBCUTANEOUS | Status: AC
  Administered 2023-11-13: 284 mg via SUBCUTANEOUS
  Filled 2023-11-13: qty 1.5

## 2023-11-13 NOTE — Progress Notes (Signed)
 Diagnosis: Hyperlipidemia  Provider:  Chilton Greathouse MD  Procedure: Injection  Leqvio (inclisiran), Dose: 284 mg, Site: subcutaneous, Number of injections: 1  Injection Site(s): Left arm  Post Care: Patient declined observation  Discharge: Condition: Good, Destination: Home . AVS Declined  Performed by:  Wyvonne Lenz, RN

## 2023-11-20 ENCOUNTER — Encounter: Payer: Self-pay | Admitting: Hematology

## 2023-11-20 ENCOUNTER — Encounter: Payer: Self-pay | Admitting: Nurse Practitioner

## 2023-11-20 ENCOUNTER — Encounter: Payer: Self-pay | Admitting: Cardiology

## 2023-11-26 ENCOUNTER — Ambulatory Visit (HOSPITAL_COMMUNITY)
Admission: RE | Admit: 2023-11-26 | Discharge: 2023-11-26 | Disposition: A | Discharge status: Home / Self Care | Source: Ambulatory Visit | Attending: Nurse Practitioner | Admitting: Nurse Practitioner

## 2023-11-26 ENCOUNTER — Other Ambulatory Visit (HOSPITAL_COMMUNITY): Payer: Self-pay | Admitting: Nurse Practitioner

## 2023-11-26 DIAGNOSIS — R519 Headache, unspecified: Secondary | ICD-10-CM | POA: Diagnosis not present

## 2023-11-26 DIAGNOSIS — Z170 Estrogen receptor positive status [ER+]: Secondary | ICD-10-CM | POA: Diagnosis not present

## 2023-11-26 DIAGNOSIS — C50412 Malignant neoplasm of upper-outer quadrant of left female breast: Secondary | ICD-10-CM | POA: Insufficient documentation

## 2023-11-26 DIAGNOSIS — E222 Syndrome of inappropriate secretion of antidiuretic hormone: Secondary | ICD-10-CM | POA: Insufficient documentation

## 2023-11-26 DIAGNOSIS — I6782 Cerebral ischemia: Secondary | ICD-10-CM | POA: Diagnosis not present

## 2023-11-26 DIAGNOSIS — Z1810 Retained metal fragments, unspecified: Secondary | ICD-10-CM | POA: Diagnosis not present

## 2023-11-26 DIAGNOSIS — G8929 Other chronic pain: Secondary | ICD-10-CM | POA: Diagnosis not present

## 2023-11-26 MED ORDER — GADOBUTROL 1 MMOL/ML IV SOLN
9.0000 mL | Freq: Once | INTRAVENOUS | Status: AC | PRN
Start: 2023-11-26 — End: 2023-11-26
  Administered 2023-11-26: 9 mL via INTRAVENOUS

## 2023-11-27 ENCOUNTER — Telehealth: Payer: Self-pay | Admitting: Nurse Practitioner

## 2023-11-27 ENCOUNTER — Telehealth: Payer: Self-pay

## 2023-11-27 ENCOUNTER — Other Ambulatory Visit: Payer: Self-pay

## 2023-11-27 DIAGNOSIS — I6501 Occlusion and stenosis of right vertebral artery: Secondary | ICD-10-CM

## 2023-11-27 NOTE — Telephone Encounter (Signed)
 Attempted to call patient to discuss MRI results. Plan to refer her urgently to vein and vascular. Left message on her voicemail to return the phone call at [phone]. 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  -Rande Bushy, NP

## 2023-11-27 NOTE — Telephone Encounter (Signed)
 Bartholomew Light w/[facility] called report on pt's MRI:    ADDENDUM REPORT: 11/27/2023 09:00   ADDENDUM: Finding omitted from impression: Signal abnormality within the visualized distal cervical and intracranial right vertebral artery suggesting high-grade stenosis or vessel occlusion. Consider MR or CT angiography for further evaluation.   These results will be called to the ordering clinician or representative by the Radiologist Assistant, and communication documented in the PACS or [employer].     Electronically Signed   By: Bascom Lily D.O.   On: 11/27/2023 09:00   Notified Dr.Feng and Rande Bushy, NP of the call.
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
## 2023-12-27 ENCOUNTER — Other Ambulatory Visit: Payer: Self-pay | Admitting: Internal Medicine

## 2024-01-03 ENCOUNTER — Ambulatory Visit: Payer: Self-pay | Admitting: Cardiology

## 2024-01-15 ENCOUNTER — Other Ambulatory Visit: Payer: Self-pay | Admitting: Nurse Practitioner

## 2024-01-15 DIAGNOSIS — I6501 Occlusion and stenosis of right vertebral artery: Secondary | ICD-10-CM

## 2024-01-15 DIAGNOSIS — R519 Headache, unspecified: Secondary | ICD-10-CM

## 2024-01-18 DIAGNOSIS — Z1211 Encounter for screening for malignant neoplasm of colon: Secondary | ICD-10-CM | POA: Diagnosis not present

## 2024-02-01 ENCOUNTER — Ambulatory Visit: Admitting: Cardiology

## 2024-02-04 ENCOUNTER — Ambulatory Visit: Payer: Medicare Other | Attending: Surgery

## 2024-02-04 VITALS — Wt 201.2 lb

## 2024-02-04 DIAGNOSIS — Z483 Aftercare following surgery for neoplasm: Secondary | ICD-10-CM | POA: Insufficient documentation

## 2024-02-04 NOTE — Therapy (Signed)
 OUTPATIENT PHYSICAL THERAPY SOZO SCREENING NOTE   Patient Name: Christy Young MRN: [id number] DOB:1946/01/22, 78 y.o., female Today's Date: 02/04/2024  PCP: Charlett Apolinar POUR, MD REFERRING PROVIDER: Belinda Cough, MD   PT End of Session - 02/04/24 1010     Visit Number 3   # unchanged due to screen only   PT Start Time 1008    PT Stop Time 1012    PT Time Calculation (min) 4 min    Activity Tolerance Patient tolerated treatment well    Behavior During Therapy WFL for tasks assessed/performed          Past Medical History:  Diagnosis Date   ARTHRITIS    Breast cancer (HCC)    Carpal tunnel syndrome 08/23/2015   Left   Exertional dyspnea    Fatigue    Gout, unspecified    Hyperlipidemia    Hypertension    meds since age 51    Hyperuricemia    Nonspecific abnormal electrocardiogram (ECG) (EKG)    t wave non acute     OBESITY    Palpitations    hospitalized 2005 felt from stress neg cards eval   Primary hyperparathyroidism (HCC)    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   Past Surgical History:  Procedure Laterality Date   ABDOMINAL HYSTERECTOMY     BREAST BIOPSY     right side   BREAST LUMPECTOMY WITH RADIOACTIVE SEED AND SENTINEL LYMPH NODE BIOPSY Left 06/07/2021   Procedure: LEFT BREAST LUMPECTOMY WITH RADIOACTIVE SEED X2 AND AXILLARY SENTINEL LYMPH NODE BIOPSY;  Surgeon: Belinda Cough, MD;  Location: [facility] OR;  Service: General;  Laterality: Left;   COLONOSCOPY     FACIAL COSMETIC SURGERY  1980   Patient Active Problem List   Diagnosis Date Noted   Statin myopathy 06/29/2022   Malignant neoplasm of upper-outer quadrant of left breast in female, estrogen receptor positive (HCC) 05/30/2021   Pain of left lower extremity 03/16/2021   Exertional dyspnea 03/08/2021   Decreased exercise tolerance 03/08/2021   Hyperparathyroidism (HCC) 02/09/2018   Abnormal prominence of clavicle 02/07/2018   Psoriasis 10/23/2017   Carpal tunnel syndrome  08/23/2015   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   H/O: gout 08/08/2015   Elevated blood sugar 08/08/2015   Essential hypertension 07/09/2014   Mixed hyperlipidemia 07/09/2014   Body aches 07/09/2014   Urinary frequency 07/09/2014   Numbness and tingling in left hand 07/09/2014   Visit for preventive health examination 04/11/2013   Obesity (BMI 30-39.9) 04/11/2013   Statin intolerance 04/11/2013   At risk for coronary artery disease 05/15/2012   Family history of premature coronary heart disease 04/23/2012   Fatigue 12/29/2010   Nonspecific abnormal electrocardiogram (ECG) (EKG) 12/29/2010   Hyperuricemia 12/29/2010   Palpitations    OBESITY 01/10/2010   HYPERLIPIDEMIA 11/09/2009   Gout 11/09/2009   Essential hypertension 11/09/2009   Arthropathy 11/09/2009    REFERRING DIAG: left breast cancer at risk for lymphedema  THERAPY DIAG:  Aftercare following surgery for neoplasm  PERTINENT HISTORY: Patient underwent a left lumpectomy and sentinel node biopsy (1 negative node) on 06/07/2021. It is ER/PR positive and HER2 negative with a Ki67 of 40%.   PRECAUTIONS: left UE Lymphedema risk, None  SUBJECTIVE: I flew to [place] and forgot to wear my sleeve.   PAIN:  Are you having pain? No  SOZO SCREENING: Patient was assessed today using the SOZO machine to determine the lymphedema index score. This  was compared to her baseline score. It was determined that she is NOT within the recommended range when compared to her baseline. She has her compression sleeve already from when she was last measured with us  so will resume wear of this. It is recommended she return in 1 month to be reassessed. If she continues to measure outside the recommended range, physical therapy treatment will be recommended at that time and a referral requested.   L-DEX FLOWSHEETS - 02/04/24 1000       L-DEX LYMPHEDEMA SCREENING   Measurement Type Unilateral    L-DEX MEASUREMENT EXTREMITY  Upper Extremity    POSITION  Standing    DOMINANT SIDE Right    At Risk Side Left    BASELINE SCORE (UNILATERAL) 2.9    L-DEX SCORE (UNILATERAL) 9.5    VALUE CHANGE (UNILAT) 6.6         P: Pt to return in one month after resuming daily wear of her compression sleeve.    Aden Berwyn Caldron, PTA 02/04/2024, 10:12 AM
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
## 2024-02-06 ENCOUNTER — Encounter: Payer: Self-pay | Admitting: Hematology

## 2024-02-06 ENCOUNTER — Encounter: Payer: Self-pay | Admitting: Cardiology

## 2024-02-06 ENCOUNTER — Encounter: Payer: Self-pay | Admitting: Neurology

## 2024-02-06 ENCOUNTER — Other Ambulatory Visit

## 2024-02-06 ENCOUNTER — Ambulatory Visit: Admitting: Neurology

## 2024-02-06 VITALS — BP 139/83 | HR 85 | Resp 18 | Ht 65.0 in | Wt 202.0 lb

## 2024-02-06 DIAGNOSIS — I6501 Occlusion and stenosis of right vertebral artery: Secondary | ICD-10-CM | POA: Diagnosis not present

## 2024-02-06 DIAGNOSIS — G3184 Mild cognitive impairment, so stated: Secondary | ICD-10-CM

## 2024-02-06 DIAGNOSIS — I679 Cerebrovascular disease, unspecified: Secondary | ICD-10-CM

## 2024-02-06 DIAGNOSIS — R519 Headache, unspecified: Secondary | ICD-10-CM | POA: Diagnosis not present

## 2024-02-06 NOTE — Patient Instructions (Addendum)
 We will check vitamin B12, RPR and TSH  We will also check neurocognitive evaluation We will check CTA head and neck Start aspirin 81mg  daily Follow up after testing.

## 2024-02-07 LAB — RPR: RPR Ser Ql: NONREACTIVE

## 2024-02-07 LAB — VITAMIN B12: Vitamin B-12: 436 pg/mL (ref 200–1100)

## 2024-02-07 LAB — TSH: TSH: 1.45 m[IU]/L (ref 0.40–4.50)

## 2024-02-08 ENCOUNTER — Ambulatory Visit: Payer: Self-pay | Admitting: Neurology

## 2024-02-11 DIAGNOSIS — D125 Benign neoplasm of sigmoid colon: Secondary | ICD-10-CM | POA: Diagnosis not present

## 2024-02-11 DIAGNOSIS — Z1211 Encounter for screening for malignant neoplasm of colon: Secondary | ICD-10-CM | POA: Diagnosis not present

## 2024-02-11 LAB — HM COLONOSCOPY

## 2024-02-13 ENCOUNTER — Ambulatory Visit
Admission: RE | Admit: 2024-02-13 | Discharge: 2024-02-13 | Disposition: A | Discharge status: Home / Self Care | Source: Ambulatory Visit | Attending: Neurology | Admitting: Neurology

## 2024-02-13 DIAGNOSIS — R519 Headache, unspecified: Secondary | ICD-10-CM

## 2024-02-13 DIAGNOSIS — D125 Benign neoplasm of sigmoid colon: Secondary | ICD-10-CM | POA: Diagnosis not present

## 2024-02-13 DIAGNOSIS — G3184 Mild cognitive impairment, so stated: Secondary | ICD-10-CM

## 2024-02-13 DIAGNOSIS — I6521 Occlusion and stenosis of right carotid artery: Secondary | ICD-10-CM | POA: Diagnosis not present

## 2024-02-13 DIAGNOSIS — I6501 Occlusion and stenosis of right vertebral artery: Secondary | ICD-10-CM

## 2024-02-13 MED ORDER — IOPAMIDOL (ISOVUE-370) INJECTION 76%: 80.0000 mL | Freq: Once | INTRAVENOUS | Status: DC | PRN

## 2024-02-13 MED ORDER — IOPAMIDOL (ISOVUE-370) INJECTION 76%
80.0000 mL | Freq: Once | INTRAVENOUS | Status: AC | PRN
  Administered 2024-02-13: 80 mL via INTRAVENOUS

## 2024-02-14 ENCOUNTER — Encounter: Payer: Self-pay | Admitting: Cardiology

## 2024-02-14 ENCOUNTER — Encounter: Payer: Self-pay | Admitting: Hematology

## 2024-02-14 ENCOUNTER — Ambulatory Visit: Attending: Cardiology | Admitting: Cardiology

## 2024-02-14 ENCOUNTER — Other Ambulatory Visit (HOSPITAL_COMMUNITY): Payer: Self-pay

## 2024-02-14 VITALS — BP 124/80 | HR 79 | Ht 65.0 in | Wt 199.0 lb

## 2024-02-14 DIAGNOSIS — I10 Essential (primary) hypertension: Secondary | ICD-10-CM | POA: Diagnosis not present

## 2024-02-14 DIAGNOSIS — E782 Mixed hyperlipidemia: Secondary | ICD-10-CM | POA: Insufficient documentation

## 2024-02-14 MED ORDER — FUROSEMIDE 20 MG PO TABS
20.0000 mg | ORAL_TABLET | Freq: Every day | ORAL | 3 refills | Status: AC | PRN
  Filled 2024-02-14: qty 30, 30d supply, fill #0

## 2024-02-14 NOTE — Progress Notes (Signed)
 Cardiology Office Note:  .   Date:  02/14/2024  ID:  Christy Young, DOB [birth year], MRN [id number] PCP: Charlett Apolinar POUR, MD  [facility] Providers Cardiologist:  Newman Lawrence, MD PCP: Charlett Apolinar POUR, MD  Chief Complaint  Patient presents with   Hypertension   Hyperlipidemia     Christy Young is a 78 y.o. female with primary hyperparathyroidism, obesity, arthritis, mixed hyperlipidemia, s/p lumpectomy, chemoradiation for breast cancer   History of Present Illness  Patient is a well.  She has no significant change in mild baseline exertional dyspnea.  She takes Lasix  as needed with which leg edema is well-controlled.  Blood pressure is well-controlled.  She is compliant with her medical therapy, including Leqvio  injections.    Vitals:   02/14/24 0811  BP: 124/80  Pulse: 79  SpO2: 95%      Review of Systems  Cardiovascular:  Negative for chest pain, dyspnea on exertion, leg swelling, palpitations and syncope.        Studies Reviewed: SABRA        EKG 02/14/2024: Normal sinus rhythm Normal ECG No previous ECGs available     Echocardiogram 2023:  Left ventricle cavity is normal in size. Moderate concentric hypertrophy  of the left ventricle. Normal global wall motion. Normal LV systolic  function with EF 60%. Doppler evidence of grade I (impaired) diastolic  dysfunction, normal LAP.  Structurally normal trileaflet aortic valve.  Mild (Grade I) aortic  regurgitation.  Structurally normal mitral valve.  Mild (Grade I) mitral regurgitation.  Mild tricuspid regurgitation. Estimated pulmonary artery systolic pressure  40 mmHg  Mild pulmonic regurgitation.  Previous study on 04/01/2021 reported severe asymmetric hypertrophy, mod  AI, mild to mod MR & TR, estimated PASP 31 mmHg.    Labs 10/2023: Hb 13.0 Cr 0.77 TSH 1.4  12/2022: Chol 129, TG 114, HDL 54, LDL 55   Physical Exam Vitals and nursing note reviewed.  Constitutional:      General: She  is not in acute distress. Neck:     Vascular: No JVD.  Cardiovascular:     Rate and Rhythm: Normal rate and regular rhythm.     Heart sounds: Normal heart sounds. No murmur heard. Pulmonary:     Effort: Pulmonary effort is normal.     Breath sounds: Normal breath sounds. No wheezing or rales.  Musculoskeletal:     Right lower leg: No edema.     Left lower leg: No edema.      VISIT DIAGNOSES:   ICD-10-CM   1. Mixed hyperlipidemia  E78.2 EKG 12-Lead    2. Essential hypertension  I10 EKG 12-Lead       Christy Young is a 78 y.o. female with primary hyperparathyroidism, obesity, arthritis, mixed hyperlipidemia, s/p lumpectomy, chemoradiation for breast cancer  Assessment & Plan  Dyspnea on exertion: Mild PAH, symptoms controlled with as needed Lasix . No repeat echocardiogram necessary, unless there is any significant change in her clinical symptoms.  Mixed hyperlipidemia:  Could not tolerate Lipitor or daily Crestor  due to myalgias.  Continue same. Check lipid panel.   Hypertension: Controlled        Meds ordered this encounter  Medications   furosemide  (LASIX ) 20 MG tablet    Sig: Take 1 tablet (20 mg total) by mouth daily as needed for fluid or edema.    Dispense:  60 tablet    Refill:  3     F/u in 2 years  Signed, [employer],  MD

## 2024-02-14 NOTE — Patient Instructions (Signed)
 Medication Instructions:  REFILL Lasix   *If you need a refill on your cardiac medications before your next appointment, please call your pharmacy*  Lab Work: LIPID PANEL   If you have labs (blood work) drawn today and your tests are completely normal, you will receive your results only by: MyChart Message (if you have MyChart) OR A paper copy in the mail If you have any lab test that is abnormal or we need to change your treatment, we will call you to review the results.   Follow-Up: At [facility], you and your health needs are our priority.  As part of our continuing mission to provide you with exceptional heart care, our providers are all part of one team.  This team includes your primary Cardiologist (physician) and Advanced Practice Providers or APPs (Physician Assistants and Nurse Practitioners) who all work together to provide you with the care you need, when you need it.  Your next appointment:   2 year(s)  Provider:   Newman JINNY Lawrence, MD

## 2024-02-15 ENCOUNTER — Ambulatory Visit: Payer: Self-pay | Admitting: Cardiology

## 2024-02-15 LAB — LIPID PANEL
Chol/HDL Ratio: 2.5 ratio (ref 0.0–4.4)
Cholesterol, Total: 145 mg/dL (ref 100–199)
HDL: 58 mg/dL (ref >39)
LDL Chol Calc (NIH): 71 mg/dL (ref 0–99)
Triglycerides: 87 mg/dL (ref 0–149)
VLDL Cholesterol Cal: 16 mg/dL (ref 5–40)

## 2024-02-27 ENCOUNTER — Telehealth: Payer: Self-pay | Admitting: Neurology

## 2024-02-27 NOTE — Telephone Encounter (Signed)
 Called patient and left message for a call back.   Results from result note: The CT of the arteries does confirm a plaque that is blocking part of the right vertebral artery but she is getting blood flow from the left vertebral artery. There isn't anything invasive to do. As I recommended, start aspirin 81mg  daily and make sure cholesterol is adequately treated.

## 2024-02-27 NOTE — Telephone Encounter (Signed)
 Pt stated that someone called her and lefted her a message, stating that they called to go over her CT scan results. Pt wants a call back to discuss the results. Thanks

## 2024-02-27 NOTE — Telephone Encounter (Signed)
 See results notes.

## 2024-02-27 NOTE — Telephone Encounter (Signed)
 Pt LM with AN. She is returning a call about results

## 2024-02-27 NOTE — Progress Notes (Signed)
 I left message on voicemail to call office at 8:36am 02/27/2024

## 2024-02-27 NOTE — Telephone Encounter (Signed)
 Left a message with after hour service on 02-27-24 at 12:12 pm   Caller is calling to get the results and is returning a call

## 2024-02-28 ENCOUNTER — Telehealth: Payer: Self-pay | Admitting: Neurology

## 2024-02-28 NOTE — Telephone Encounter (Signed)
Pt is calling mahina back 

## 2024-02-28 NOTE — Telephone Encounter (Signed)
 I advised of Ct results, voiced understanding and thanked me for calling.

## 2024-03-03 ENCOUNTER — Ambulatory Visit (INDEPENDENT_AMBULATORY_CARE_PROVIDER_SITE_OTHER): Payer: Medicare Other | Admitting: Internal Medicine

## 2024-03-03 ENCOUNTER — Encounter: Payer: Self-pay | Admitting: Internal Medicine

## 2024-03-03 VITALS — BP 122/80 | HR 90 | Ht 65.0 in | Wt 201.0 lb

## 2024-03-03 DIAGNOSIS — E210 Primary hyperparathyroidism: Secondary | ICD-10-CM | POA: Diagnosis not present

## 2024-03-03 NOTE — Progress Notes (Unsigned)
 Name: Christy Young  MRN/ DOB: [id number], February 10, 1946    Age/ Sex: 78 y.o., female     PCP: Charlett Apolinar POUR, MD   Reason for Endocrinology Evaluation: Hyperparathyroidism     Initial Endocrinology Clinic Visit: 02/07/2018    PATIENT IDENTIFIER: [name] is a 78 y.o., female with a past medical history of Hx of breast CA. She has followed with [facility] since 02/07/2018 for consultative assistance with management of her hyperparathyroidism.       HISTORICAL SUMMARY: The patient was first diagnosed with high parathyroidism since 2018.  With a serum calcium  max level of 11.4 MG/DL in 7976, and a max level of PTH at 196 PG/mL   DXA normal in December 2021 She had followed up with Dr. Kassie from 2019 until 2023   24-hour urinary calcium  normal at 225 mg 02/2022  SUBJECTIVE:    Today (03/03/2024):  Christy Young is here for follow-up on hyperparathyroidism  She stays hydrated ~ 4 glasses of water  Continues with incontinence due to prolapsed bladder , but this has been improving  She was evaluated by neurology to memory issues  No constipation  No renal stones  Denies polydipsia  She had a fall in April, 2025 , fell down the steps . 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 She has never used smokeless tobacco. She reports current alcohol use of about 3.0 standard drinks of alcohol per week. She reports that she does not use drugs. Family History:  Family History  Problem Relation Age of Onset   Hypertension Mother    Arthritis Mother    Alcohol abuse Father        deceased   Diabetes Father    Lymphoma Sister        non hodgkins   Heart attack Brother    Lung cancer Brother    Lung cancer Brother    Prostate cancer Brother        dx after 40   Lung cancer Brother    Heart attack Son 64       1999   Hyperparathyroidism Neg Hx  HOME MEDICATIONS: Allergies as of 03/03/2024       Reactions   Penicillins Other (See Comments)   Patient  says she is not allergic to amoxicillin  or augmentin    Has taken these wo problem ? Of SE when she was 19  Not sever         Medication List        Accurate as of March 03, 2024  2:07 PM. If you have any questions, ask your nurse or doctor.          furosemide  20 MG tablet Commonly known as: LASIX  Take 1 tablet (20 mg total) by mouth daily as needed for fluid or edema.   inclisiran 284 MG/1.5ML Sosy injection Commonly known as: LEQVIO  Inject 284 mg into the skin once.   nebivolol  5 MG tablet Commonly known as: BYSTOLIC  TAKE 1 TABLET DAILY   tamoxifen  20 MG tablet Commonly known as: NOLVADEX  TAKE 1 TABLET BY MOUTH EVERY DAY   Vitamin D3 125 MCG (5000 UT) Caps Take 1 capsule (5,000 Units total) by mouth daily.          OBJECTIVE:   PHYSICAL EXAM: VS:  BP 122/80 (BP Location: Right Arm, Patient Position: Sitting, Cuff Size: Normal)   Pulse 90   Ht 5' 5 (1.651 m)   Wt 201 lb (91.2 kg)   SpO2 96%   BMI 33.45 kg/m    EXAM: General: Pt appears well and is in NAD  Neck: General: Supple without adenopathy. Thyroid : Thyroid  size normal.  No goiter or nodules appreciated.  Lungs: Clear with good BS bilat   Heart: Auscultation: RRR.  Abdomen: soft, nontender  Extremities:  BL LE: No pretibial edema   Mental Status: Judgment, insight: Intact Orientation: Oriented to time, place, and person Mood and affect: No depression, anxiety, or agitation     DATA REVIEWED:      Latest Reference Range & Units 02/28/23 15:16  Sodium 135 - 145 mEq/L 141  Potassium 3.5 - 5.1 mEq/L 4.4  Chloride 96 - 112 mEq/L 108  CO2 19 - 32 mEq/L 26  Glucose 70 - 99 mg/dL 94  BUN 6 - 23 mg/dL 18  Creatinine 9.59 - 8.79 mg/dL 9.14  Calcium  8.4 - 10.5 mg/dL 88.9 (H)  Albumin 3.5 - 5.2 g/dL 4.0  GFR >39.99 mL/min 66.12    Latest Reference Range & Units 02/28/23 15:16  VITD 30.00 - 100.00 ng/mL 51.77    Latest Reference Range & Units 02/28/23 15:16  PTH, Intact 16 - 77 pg/mL 193 (H)      ASSESSMENT / PLAN / RECOMMENDATIONS:   Primary hyperparathyroidism:  -Serum calcium  remains elevated but stable -PTH remains elevated -DXA normal 05/2020 - 24-hour urinary excretion of calcium  normal 225 mg 02/2022 -No indication for surgical intervention at this time   Medications  Stay hydrated Maintain 2-3 servings of dietary calcium  daily Continue vitamin D3 5000 IU     Follow-up in 1 YR    Signed electronically by: Stefano Redgie Butts, MD  [facility]  [facility] [address] [place], [place] [address] Phone: [phone] FAX: [phone]      CC: Charlett Apolinar POUR, MD [address] [place] [address] Phone: [phone]  Fax: [phone]   Return to Endocrinology clinic as  below: Future Appointments  Date Time Provider Department Center  03/17/2024  8:50 AM Aden Berwyn LABOR, PTA [facility] None  05/08/2024  8:30 AM Richie Hussar, PhD [facility] None  05/08/2024  9:30 AM [facility]- NEUROPSYCH TECH [facility] None  05/12/2024 11:00 AM [facility] LAB [facility] None  05/12/2024 11:30 AM Hanford Powell BRAVO, NP [facility] None  05/13/2024 10:30 AM [name], Apolinar POUR, MD [facility] [address]  05/16/2024  9:00 AM [facility] 2 [facility] None  05/16/2024  2:30 PM Richie Hussar, PhD [facility] None  06/02/2024  9:50 AM Skeet Juliene SAUNDERS, DO [facility] None

## 2024-03-03 NOTE — Patient Instructions (Signed)
Stay hydrated  Avoid over the counter calcium tablets  Maintain 2-3 servings of calcium through the diet daily

## 2024-03-04 ENCOUNTER — Ambulatory Visit: Payer: Self-pay | Admitting: Internal Medicine

## 2024-03-04 LAB — BASIC METABOLIC PANEL WITH GFR
BUN: 16 mg/dL (ref 7–25)
CO2: 24 mmol/L (ref 20–32)
Calcium: 10.6 mg/dL — ABNORMAL HIGH (ref 8.6–10.4)
Chloride: 107 mmol/L (ref 98–110)
Creat: 0.75 mg/dL (ref 0.60–1.00)
Glucose, Bld: 100 mg/dL — ABNORMAL HIGH (ref 65–99)
Potassium: 3.8 mmol/L (ref 3.5–5.3)
Sodium: 140 mmol/L (ref 135–146)
eGFR: 81 mL/min/1.73m2 (ref >=60)

## 2024-03-04 LAB — VITAMIN D 25 HYDROXY (VIT D DEFICIENCY, FRACTURES): Vit D, 25-Hydroxy: 52 ng/mL (ref 30–100)

## 2024-03-04 LAB — ALBUMIN: Albumin: 4 g/dL (ref 3.6–5.1)

## 2024-03-04 LAB — PARATHYROID HORMONE, INTACT (NO CA): PTH: 164 pg/mL — ABNORMAL HIGH (ref 16–77)

## 2024-03-12 ENCOUNTER — Ambulatory Visit (INDEPENDENT_AMBULATORY_CARE_PROVIDER_SITE_OTHER)
Admission: RE | Admit: 2024-03-12 | Discharge: 2024-03-12 | Disposition: A | Discharge status: Home / Self Care | Source: Ambulatory Visit | Attending: Internal Medicine | Admitting: Internal Medicine

## 2024-03-12 DIAGNOSIS — E210 Primary hyperparathyroidism: Secondary | ICD-10-CM

## 2024-03-17 ENCOUNTER — Ambulatory Visit: Attending: Surgery

## 2024-03-17 VITALS — Wt 198.2 lb

## 2024-03-17 DIAGNOSIS — Z483 Aftercare following surgery for neoplasm: Secondary | ICD-10-CM | POA: Insufficient documentation

## 2024-03-17 NOTE — Therapy (Signed)
 OUTPATIENT PHYSICAL THERAPY SOZO SCREENING NOTE   Patient Name: Christy Young MRN: [id number] DOB:17-Jan-1946, 78 y.o., female Today's Date: 03/17/2024  PCP: Charlett Apolinar POUR, MD REFERRING PROVIDER: Belinda Cough, MD   PT End of Session - 03/17/24 [phone]     Visit Number 3   # unchanged due to screen only   PT Start Time 0855    PT Stop Time 0859    PT Time Calculation (min) 4 min    Activity Tolerance Patient tolerated treatment well    Behavior During Therapy [facility] for tasks assessed/performed          Past Medical History:  Diagnosis Date   ARTHRITIS    Breast cancer (HCC)    Carpal tunnel syndrome 08/23/2015   Left   Exertional dyspnea    Fatigue    Gout, unspecified    Hyperlipidemia    Hypertension    meds since age 39    Hyperuricemia    Nonspecific abnormal electrocardiogram (ECG) (EKG)    t wave non acute     OBESITY    Palpitations    hospitalized 2005 felt from stress neg cards eval   Primary hyperparathyroidism (HCC)    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   Past Surgical History:  Procedure Laterality Date   ABDOMINAL HYSTERECTOMY     BREAST BIOPSY     right side   BREAST LUMPECTOMY WITH RADIOACTIVE SEED AND SENTINEL LYMPH NODE BIOPSY Left 06/07/2021   Procedure: LEFT BREAST LUMPECTOMY WITH RADIOACTIVE SEED X2 AND AXILLARY SENTINEL LYMPH NODE BIOPSY;  Surgeon: Belinda Cough, MD;  Location: [facility] OR;  Service: General;  Laterality: Left;   COLONOSCOPY     FACIAL COSMETIC SURGERY  1980   Patient Active Problem List   Diagnosis Date Noted   Statin myopathy 06/29/2022   Malignant neoplasm of upper-outer quadrant of left breast in female, estrogen receptor positive (HCC) 05/30/2021   Pain of left lower extremity 03/16/2021   Exertional dyspnea 03/08/2021   Decreased exercise tolerance 03/08/2021   Hyperparathyroidism (HCC) 02/09/2018   Abnormal prominence of clavicle 02/07/2018   Psoriasis 10/23/2017   Carpal tunnel syndrome  08/23/2015   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   H/O: gout 08/08/2015   Elevated blood sugar 08/08/2015   Essential hypertension 07/09/2014   Mixed hyperlipidemia 07/09/2014   Body aches 07/09/2014   Urinary frequency 07/09/2014   Numbness and tingling in left hand 07/09/2014   Visit for preventive health examination 04/11/2013   Obesity (BMI 30-39.9) 04/11/2013   Statin intolerance 04/11/2013   At risk for coronary artery disease 05/15/2012   Family history of premature coronary heart disease 04/23/2012   Fatigue 12/29/2010   Nonspecific abnormal electrocardiogram (ECG) (EKG) 12/29/2010   Hyperuricemia 12/29/2010   Palpitations    OBESITY 01/10/2010   HYPERLIPIDEMIA 11/09/2009   Gout 11/09/2009   Essential hypertension 11/09/2009   Arthropathy 11/09/2009    REFERRING DIAG: left breast cancer at risk for lymphedema  THERAPY DIAG: Aftercare following surgery for neoplasm  PERTINENT HISTORY: Patient underwent a left lumpectomy and sentinel node biopsy (1 negative node) on 06/07/2021. It is ER/PR positive and HER2 negative with a Ki67 of 40%.   PRECAUTIONS: left UE Lymphedema risk, None  SUBJECTIVE: Pt returns for her 1 month reassess L-Dex screen.  I try to wear my sleeve more often.  PAIN:  Are you having pain? No  SOZO SCREENING: Patient was assessed today using the SOZO machine to  determine the lymphedema index score. This was compared to her baseline score. It was determined that she is within the recommended range when compared to her baseline and no further action is needed at this time. She will continue SOZO screenings. These are done every 3 months for 2 years post operatively followed by every 6 months for 2 years, and then annually.   L-DEX FLOWSHEETS - 03/17/24 0800       L-DEX LYMPHEDEMA SCREENING   Measurement Type Unilateral    L-DEX MEASUREMENT EXTREMITY Upper Extremity    POSITION  Standing    DOMINANT SIDE Right    At Risk  Side Left    BASELINE SCORE (UNILATERAL) 2.9    L-DEX SCORE (UNILATERAL) 5.3    VALUE CHANGE (UNILAT) 2.4         P:  Cont 6 month L-Dex screens now.  Aden Berwyn Caldron, PTA 03/17/2024, 8:58 AM

## 2024-03-19 ENCOUNTER — Other Ambulatory Visit: Payer: Self-pay | Admitting: Internal Medicine

## 2024-04-11 DIAGNOSIS — Z23 Encounter for immunization: Secondary | ICD-10-CM | POA: Diagnosis not present

## 2024-04-30 ENCOUNTER — Other Ambulatory Visit: Payer: Self-pay | Admitting: Hematology

## 2024-05-08 ENCOUNTER — Ambulatory Visit: Payer: Self-pay

## 2024-05-08 ENCOUNTER — Institutional Professional Consult (permissible substitution): Admitting: Psychology

## 2024-05-11 ENCOUNTER — Other Ambulatory Visit: Payer: Self-pay | Admitting: Nurse Practitioner

## 2024-05-11 DIAGNOSIS — C50412 Malignant neoplasm of upper-outer quadrant of left female breast: Secondary | ICD-10-CM

## 2024-05-11 NOTE — Progress Notes (Signed)
 Patient Care Team: [name], Christy POUR, MD as PCP - General (Internal Medicine) Christy Newman PARAS, MD as PCP - Cardiology (Cardiology) Christy Marjory MATSU, MD as Referring Physician (Rheumatology) Christy Cough, MD as Consulting Physician (General Surgery) Christy Callander, MD as Consulting Physician (Hematology) Christy Domino, MD as Attending Physician (Radiation Oncology) Christy Novel, MD as Consulting Physician (Physical Medicine and Rehabilitation) [facility] PARAS, MD as Consulting Physician (Cardiology) [facility], Christy Cardinal, MD as Attending Physician (Endocrinology)  Clinic Day:  05/12/2024  Referring physician: Charlett Christy POUR, MD  ASSESSMENT & PLAN:   Assessment & Plan: Malignant neoplasm of upper-outer quadrant of left breast in female, estrogen receptor positive (HCC) multifocal invasive ductal carcinoma, Stage IIA, 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 Belinda showed grade 3 IDC to both tumors (2.5 cm and 0.8 cm) with small foci of DCIS. Margins and lymph node were negative. -Oncotype Dx recurrence score of 29, high risk -s/p 4 cycles adjuvant TC 07/15/21 - 09/15/21. She tolerated fairly with significant side effects. -s/p radiation under Dr. Izell, 10/10/21 - 11/04/21. -she began tamoxifen  in 11/2021, tolerating moderately well except worsening incontinence/leakage with no signs of UTI; she follows with urogynecology -Most recent mammogram 06/11/2023 with benign results.  -continue tamoxifen  daily.  Trial of dose reduction to 10 mg daily due to negative side effects. -3D screening mammogram already scheduled for late December 2025 at [facility] mammography. -continue breast cancer surveillance. Labs and follow up in 6 months and sooner if needed.      Bone health Patient had DEXA scan in 03/12/2024 per her primary care provider.  She does take vitamin D  5000 international units daily.  Does not take extra calcium  due to  mild hypocalcemia.  She is active and regularly participates in low impact exercise.  She has limited fall risk in her home.  She does she will be due for DEXA scan in September 2027.  Left breast cancer stage IIa, ER + Patient states that she stopped tamoxifen  for a little over a month due to weakness that she was experiencing.  States she has noticed a big improvement in her strength since then.  Restarted tamoxifen  approximately 1 week ago and has already noticed an increase in her weakness. We discussed her Oncotype score which was 29, indicating high risk for recurrence or metastatic disease. Suggested she try taking tamoxifen  10 mg daily to see if that will help new side effects while still getting benefit of anti-strogen therapy.  She is agreeable to this plan.  She is also seeing a neurologist for symptoms.  Acute processes have been excluded due to MRI/CT imaging of the brain.  She is due for repeat mammogram in late December or early January 2026.  She states that this is already scheduled at [facility].   Plan Labs reviewed. -Unremarkable CBC. - Mild hypercalcemia with remainder of CMP unremarkable. Patient agreeable to taking tamoxifen  10 mg daily due to Oncotype score of 29 and need for benefit of antiestrogen therapy. Will continue regular follow-up with neurology. Screening mammogram scheduled for late December 2025. Plan for labs and follow-up in 6 months, sooner if needed.     The patient understands the plans discussed today and is in agreement with them.  She knows to contact our office if she develops concerns prior to her next appointment.  I provided 25 minutes of face-to-face time during this encounter and > 50% was spent counseling as documented under my assessment and  plan.    [facility], [facility]  [facility] - A DEPT OF [facility]   No orders of the defined types were placed in this encounter.     CHIEF COMPLAINT:  CC: Left breast cancer, ER +  Current Treatment: Tamoxifen  20 mg daily (started 11/2021) reduced to tamoxifen  10 mg in 04/2024 due to negative side effects  INTERVAL HISTORY:  [facility] is here today for repeat clinical assessment.  She last saw me on 11/12/2023.  She reports having stopped tamoxifen  about 6 weeks ago due to mild weakness.  States that after stopping this, she noticed an increase in her strength.  She states she was started this again about a week ago and has noted gradual increase in generalized weakness.  She denies hot flashes or unusual night sweats.  She denies bone or joint pain.  She did have DEXA scan on 03/12/2024 showing osteopenia.  This was done by primary care.  She does take 5000 IU vitamin D  every day.  Does not take extra calcium  due to history hypercalcemia.  She participates in weightbearing activities.  She is limited in fall risks in her home.  She denies chest pain, chest pressure, or shortness of breath. She denies headaches or visual disturbances. She denies abdominal pain, nausea, vomiting, or changes in bowel or bladder habits.  She denies fevers or chills. She denies pain. Her appetite is good. Her weight has been stable.  I have reviewed the past medical history, past surgical history, social history and family history with the patient and they are unchanged from previous note.  ALLERGIES:  is allergic to penicillins.  MEDICATIONS:  Current Outpatient Medications  Medication Sig Dispense Refill   Cholecalciferol (VITAMIN D3) 125 MCG (5000 UT) CAPS Take 1 capsule (5,000 Units total) by mouth daily. 100 capsule 3   furosemide  (LASIX ) 20 MG tablet Take 1 tablet (20 mg total) by mouth daily as needed for fluid or edema. 60 tablet 3   inclisiran (LEQVIO ) 284 MG/1.5ML SOSY injection Inject 284 mg into the skin once.     nebivolol  (BYSTOLIC ) 5 MG tablet TAKE 1  TABLET DAILY 90 tablet 0   tamoxifen  (NOLVADEX ) 20 MG tablet TAKE 1 TABLET BY MOUTH EVERY DAY 90 tablet 1   No current facility-administered medications for this visit.    HISTORY OF PRESENT ILLNESS:   Oncology History Overview Note   Cancer Staging  Malignant neoplasm of upper-outer quadrant of left breast in female, estrogen receptor positive (HCC) Staging form: Breast, AJCC 8th Edition - Clinical stage from 06/01/2021: Stage IIA (cT2, cN0, cM0, G3, ER+, PR+, HER2-) - Unsigned      Malignant neoplasm of upper-outer quadrant of left breast in female, estrogen receptor positive (HCC)  05/02/2021 Mammogram   Exam: 3D Mammogram Diagnostic Bilateral IMPRESSION: Palpable 2.4 cm irregular high density mass in the upper-outer left breast.  Exam: Breast Ultrasound - Left IMPRESSION: 1. 2.3 cm irregular mass in left breast at 1 o'clock posterior depth, 13 mc from nipple. 2. 0.9 cm irregular mass in left breast at 1 o'clock posterior depth, 10 cm from nipple 3. Three small 3-4 mm round masses favored to represent small cysts seen at 2:30-3:00 11-12 cm from nipple. 4. No abnormal nodes in left axilla.   05/23/2021 Initial Biopsy   Diagnosis 1. Breast, left, needle core biopsy, breast mass, 2.3  cm @ 1:00 13 CMFN  - INVASIVE DUCTAL CARCINOMA, grade 3  2. Breast, left, needle core biopsy, breast mass, 0.9 cm @ 1:00 10 CMFN  - INVASIVE DUCTAL CARCINOMA, grade 1  1. PROGNOSTIC INDICATORS Results: The tumor cells are EQUIVOCAL for Her2 (2+) Estrogen Receptor: 100%, POSITIVE, STRONG STAINING INTENSITY Progesterone Receptor: 60%, POSITIVE, STRONG STAINING INTENSITY Proliferation Marker Ki67: 40%  1. FLUORESCENCE IN-SITU HYBRIDIZATION Results: GROUP 5: HER2 **NEGATIVE**  2. PROGNOSTIC INDICATORS Results: The tumor cells are NEGATIVE for Her2 (1+) Estrogen Receptor: 100%, POSITIVE, STRONG STAINING INTENSITY Progesterone Receptor: 100%, POSITIVE, STRONG STAINING INTENSITY Proliferation  Marker Ki67: 10%   05/30/2021 Initial Diagnosis   Malignant neoplasm of upper-outer quadrant of left breast in female, estrogen receptor positive (HCC)   06/07/2021 Definitive Surgery   FINAL MICROSCOPIC DIAGNOSIS:   A. BREAST, LEFT, LUMPECTOMY:  Invasive ductal carcinomas (two independent lesions, 2.5 cm and 0.8 cm) with clear margins of resection.  Please see the synoptic report after specimen B.   B. LYMPH NODE, LEFT AXILLARY #1, SENTINEL, EXCISION:  Hyperplastic lymph node with fatty infiltration which is negative for  metastatic carcinoma.    06/07/2021 Oncotype testing   Oncotype DX was obtained on the final surgical sample and the recurrence score of 28 predicts a risk of recurrence outside the breast over the next 9 years of 18%, if the patient's only systemic therapy is an antiestrogen for 5 years.  It also predicts a significant benefit from chemotherapy.    06/07/2021 Cancer Staging   Staging form: Breast, AJCC 8th Edition - Pathologic stage from 06/07/2021: Stage IB (pT2, pN0, cM0, G3, ER+, PR+, HER2-, Oncotype DX score: 29) - Signed by Christy Callander, MD on 08/05/2021 Stage prefix: Initial diagnosis Multigene prognostic tests performed: Oncotype DX Recurrence score range: Greater than or equal to 11 Histologic grading system: 3 grade system   07/15/2021 - 09/17/2021 Chemotherapy   Patient is on Treatment Plan : BREAST TC q21d     10/10/2021 - 11/04/2021 Radiation Therapy   Site Technique Total Dose (Gy) Dose per Fx (Gy) Completed Fx Beam Energies  Breast, Left: Breast_L 3D 40.05/40.05 2.67 15/15 10X  Breast, Left: Breast_L_Bst 3D 10/10 2 5/5 6X, 10X     11/2021 -  Anti-estrogen oral therapy   Tamoxifen        REVIEW OF SYSTEMS:   Constitutional: Denies fevers, chills or abnormal weight loss Eyes: Denies blurriness of vision Ears, nose, mouth, throat, and face: Denies mucositis or sore throat Respiratory: Denies Young, dyspnea or wheezes Cardiovascular: Denies  palpitation, chest discomfort or lower extremity swelling Gastrointestinal:  Denies nausea, heartburn or change in bowel habits Skin: Denies abnormal skin rashes Lymphatics: Denies new lymphadenopathy or easy bruising Neurological:Denies numbness or tingling. She does report mild, generalized weakness. This  seems to be made worse by taking tamoxifen .  Behavioral/Psych: Mood is stable, no new changes  All other systems were reviewed with the patient and are negative.   VITALS:   Today's Vitals   05/12/24 1130 05/12/24 1155  BP: 120/70   Pulse: 76   Resp: 17   Temp: 97.8 F (36.6 C)   SpO2: 95%   Weight: 200 lb 1.6 oz (90.8 kg)   PainSc:  5    Body mass index is 33.3 kg/m.   Wt Readings from Last 3 Encounters:  05/16/24 199 lb 12.8 oz (90.6 kg)  05/13/24 199 lb 3.2 oz (90.4 kg)  05/12/24 200 lb 1.6 oz (90.8 kg)    Body mass index  is 33.3 kg/m.  Performance status (ECOG): 1 - Symptomatic but completely ambulatory  PHYSICAL EXAM:   GENERAL:alert, no distress and comfortable SKIN: skin color, texture, turgor are normal, no rashes or significant lesions EYES: normal, Conjunctiva are pink and non-injected, sclera clear OROPHARYNX:no exudate, no erythema and lips, buccal mucosa, and tongue normal  NECK: supple, thyroid  normal size, non-tender, without nodularity LYMPH:  no palpable lymphadenopathy in the cervical, axillary or inguinal LUNGS: clear to auscultation and percussion with normal breathing effort HEART: regular rate & rhythm and no murmurs and no lower extremity edema ABDOMEN:abdomen soft, non-tender and normal bowel sounds Musculoskeletal:no cyanosis of digits and no clubbing  NEURO: alert & oriented x 3 with fluent speech, no focal motor/sensory deficits BREAST: Left lumpectomy scar completely healed. Expected radiation skin changes. No palpable masses or lumps in the left breast appreciated today. There is no nipple inversion or nipple discharge. No axillary  lymphadenopathy on the left side. The right breast is without palpable mass or lump today. There is no nipple inversion or nipple discharge. There is no axillary lymphadenopathy on the right.   LABORATORY DATA:  I have reviewed the data as listed    Component Value Date/Time   NA 142 05/12/2024 1047   K 4.6 05/12/2024 1047   CL 110 05/12/2024 1047   CO2 26 05/12/2024 1047   GLUCOSE 96 05/12/2024 1047   BUN 13 05/12/2024 1047   CREATININE 0.82 05/12/2024 1047   CREATININE 0.75 03/03/2024 1436   CALCIUM  10.9 (H) 05/12/2024 1047   PROT 6.7 05/12/2024 1047   ALBUMIN 4.1 05/12/2024 1047   AST 14 (L) 05/12/2024 1047   ALT 12 05/12/2024 1047   ALKPHOS 49 05/12/2024 1047   BILITOT 0.7 05/12/2024 1047   GFRNONAA >60 05/12/2024 1047     Lab Results  Component Value Date   WBC 6.1 05/12/2024   NEUTROABS 3.2 05/12/2024   HGB 13.2 05/12/2024   HCT 39.9 05/12/2024   MCV 89.3 05/12/2024   PLT 224 05/12/2024

## 2024-05-11 NOTE — Assessment & Plan Note (Addendum)
 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 Belinda showed grade 3 IDC to both tumors (2.5 cm and 0.8 cm) with small foci of DCIS. Margins and lymph node were negative. -Oncotype Dx recurrence score of 29, high risk -s/p 4 cycles adjuvant TC 07/15/21 - 09/15/21. She tolerated fairly with significant side effects. -s/p radiation under Dr. Izell, 10/10/21 - 11/04/21. -she began tamoxifen  in 11/2021, tolerating moderately well except worsening incontinence/leakage with no signs of UTI; she follows with urogynecology -Most recent mammogram 06/11/2023 with benign results.  -continue tamoxifen  daily.  Trial of dose reduction to 10 mg daily due to negative side effects. -3D screening mammogram already scheduled for late December 2025 at [facility] mammography. -continue breast cancer surveillance. Labs and follow up in 6 months and sooner if needed.

## 2024-05-12 ENCOUNTER — Inpatient Hospital Stay (HOSPITAL_BASED_OUTPATIENT_CLINIC_OR_DEPARTMENT_OTHER): Admitting: Nurse Practitioner

## 2024-05-12 ENCOUNTER — Inpatient Hospital Stay: Attending: Nurse Practitioner

## 2024-05-12 VITALS — BP 120/70 | HR 76 | Temp 97.8°F | Resp 17 | Wt 200.1 lb

## 2024-05-12 DIAGNOSIS — C50412 Malignant neoplasm of upper-outer quadrant of left female breast: Secondary | ICD-10-CM

## 2024-05-12 DIAGNOSIS — Z79810 Long term (current) use of selective estrogen receptor modulators (SERMs): Secondary | ICD-10-CM | POA: Diagnosis not present

## 2024-05-12 DIAGNOSIS — Z170 Estrogen receptor positive status [ER+]: Secondary | ICD-10-CM | POA: Diagnosis present

## 2024-05-12 LAB — CMP (CANCER CENTER ONLY)
ALT: 12 U/L (ref 0–44)
AST: 14 U/L — ABNORMAL LOW (ref 15–41)
Albumin: 4.1 g/dL (ref 3.5–5.0)
Alkaline Phosphatase: 49 U/L (ref 38–126)
Anion gap: 6 (ref 5–15)
BUN: 13 mg/dL (ref 8–23)
CO2: 26 mmol/L (ref 22–32)
Calcium: 10.9 mg/dL — ABNORMAL HIGH (ref 8.9–10.3)
Chloride: 110 mmol/L (ref 98–111)
Creatinine: 0.82 mg/dL (ref 0.44–1.00)
GFR, Estimated: >60 mL/min (ref >60)
Glucose, Bld: 96 mg/dL (ref 70–99)
Potassium: 4.6 mmol/L (ref 3.5–5.1)
Sodium: 142 mmol/L (ref 135–145)
Total Bilirubin: 0.7 mg/dL (ref 0.0–1.2)
Total Protein: 6.7 g/dL (ref 6.5–8.1)

## 2024-05-12 LAB — CBC WITH DIFFERENTIAL (CANCER CENTER ONLY)
Abs Immature Granulocytes: 0.01 K/uL (ref 0.00–0.07)
Basophils Absolute: 0.1 K/uL (ref 0.0–0.1)
Basophils Relative: 1 %
Eosinophils Absolute: 0.2 K/uL (ref 0.0–0.5)
Eosinophils Relative: 3 %
HCT: 39.9 % (ref 36.0–46.0)
Hemoglobin: 13.2 g/dL (ref 12.0–15.0)
Immature Granulocytes: 0 %
Lymphocytes Relative: 32 %
Lymphs Abs: 1.9 K/uL (ref 0.7–4.0)
MCH: 29.5 pg (ref 26.0–34.0)
MCHC: 33.1 g/dL (ref 30.0–36.0)
MCV: 89.3 fL (ref 80.0–100.0)
Monocytes Absolute: 0.6 K/uL (ref 0.1–1.0)
Monocytes Relative: 10 %
Neutro Abs: 3.2 K/uL (ref 1.7–7.7)
Neutrophils Relative %: 54 %
Platelet Count: 224 K/uL (ref 150–400)
RBC: 4.47 MIL/uL (ref 3.87–5.11)
RDW: 13.5 % (ref 11.5–15.5)
WBC Count: 6.1 K/uL (ref 4.0–10.5)
nRBC: 0 % (ref 0.0–0.2)

## 2024-05-13 ENCOUNTER — Ambulatory Visit: Admitting: Internal Medicine

## 2024-05-13 ENCOUNTER — Encounter: Payer: Self-pay | Admitting: Internal Medicine

## 2024-05-13 VITALS — BP 122/78 | HR 75 | Temp 98.5°F | Ht 62.0 in | Wt 199.2 lb

## 2024-05-13 DIAGNOSIS — I10 Essential (primary) hypertension: Secondary | ICD-10-CM

## 2024-05-13 NOTE — Progress Notes (Signed)
 Chief Complaint  Patient presents with   Annual Exam    HPI: Patient  Christy Young  78 y.o. comes in today for yearly visit . [place]  aching feet     has been off allopurinol  for a while .  ? Last uric acid  To have neurocog testing but planning to put off til after the new year  Has seen Dr Ena. had upper teeth extraction and partials  but eating ok  No recent falls   Left leg knee sometimes buckles.  Under care for breast cancer and hyperparathyroid  and HLD statin intolerant injectable has helped a lot .  Lives alone no  pets  has phone near by  is fall aware.  Bp ok felt tired this weekend  no fever  wonders if getting sinus infection  .  Gets nocturia   and interrups sleep but ok and no uti sx  has seen specialist and contemplating  Team includes endo for hyperparathyroid  neuro for memory sx , and oncology team for breast cancer  meds, and carddiology for statin intolerance HLD.  Christy Young     Health Maintenance  Topic Date Due   Medicare Annual Wellness (AWV)  05/09/2024   Mammogram  06/10/2024   COVID-19 Vaccine (7 - 2025-26 season) 05/29/2024 (Originally 02/25/2024)   DTaP/Tdap/Td (3 - Td or Tdap) 05/13/2025 (Originally 03/27/2019)   Zoster Vaccines- Shingrix (2 of 2) 06/06/2024   Pneumococcal Vaccine: 50+ Years  Completed   Influenza Vaccine  Completed   DEXA SCAN  Completed   Hepatitis C Screening  Completed   Meningococcal B Vaccine  Aged Out   Colonoscopy  Discontinued   Health Maintenance Review LIFESTYLE:  Exercise:   Tobacco/ETS: Alcohol:  Sugar beverages: Sleep: Drug use: no HH of  Work:    ROS:  GEN/ HEENT: No fever, significant weight changes sweats headaches vision problems hearing changes, CV/ PULM; No chest pain shortness of breath cough, syncope,edema  change in exercise tolerance. GI /GU: No adominal pain, vomiting, change in bowel habits. No blood in the stool. No significant GU symptoms. SKIN/HEME: ,no acute skin rashes suspicious lesions or  bleeding. No lymphadenopathy, nodules, masses.  NEURO/ PSYCH:  No neurologic signs such as weakness numbness. No depression anxiety. IMM/ Allergy: No unusual infections.  Allergy .   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    Past Surgical History:  Procedure Laterality Date   ABDOMINAL HYSTERECTOMY     BREAST BIOPSY     right side   BREAST LUMPECTOMY WITH RADIOACTIVE SEED AND SENTINEL LYMPH NODE BIOPSY Left 06/07/2021   Procedure: LEFT BREAST LUMPECTOMY WITH RADIOACTIVE SEED X2 AND AXILLARY SENTINEL LYMPH NODE BIOPSY;  Surgeon: Belinda Cough, MD;  Location: [facility] OR;  Service: General;  Laterality: Left;   COLONOSCOPY     FACIAL COSMETIC SURGERY  1980    Family History  Problem Relation Age of Onset   Hypertension Mother    Arthritis Mother    Alcohol abuse Father        deceased   Diabetes Father    Lymphoma Sister        non hodgkins   Heart attack Brother    Lung cancer Brother    Lung cancer Brother    Prostate cancer Brother        dx after 28   Lung cancer Brother    Heart attack Son 66       1999   Hyperparathyroidism Neg Hx     Social History   Socioeconomic History   Marital status: Single    Spouse name: Not on file   Number of children: 2   Years of education: Not on file   Highest education level: Not on file  Occupational History   Occupation: retired   Tobacco Use   Smoking status: Never   Smokeless tobacco: Never  Vaping Use   Vaping  status: Never Used  Substance and Sexual Activity   Alcohol use: Yes    Alcohol/week: 3.0 standard drinks of alcohol    Types: 3 Glasses of wine per week   Drug use: No   Sexual activity: Not Currently    Birth control/protection: Surgical    Comment: Hysterectomy  Other Topics Concern   Not on file  Social History Narrative   Right handed   2 story town house   Drinks caffeine         Occupation: retired patent examiner, 3 yrs of collegeBereaved parentSingleMoved in 2023/12/31 No petsG3P2Sis died of cancer lymphoma 13  and bro now with lung cancer and spread.died 2024-06-20.   Social Drivers of Corporate Investment Banker Strain: Low Risk  (05/01/2022)   Overall Financial Resource Strain (CARDIA)    Difficulty of Paying Living Expenses: Not hard at all  Food Insecurity: No Food Insecurity (05/12/2024)   Hunger Vital Sign    Worried About Running Out of Food in the Last Year: Never true    Ran Out of Food in the Last Year: Never true  Transportation Needs: No Transportation Needs (05/12/2024)   PRAPARE - Administrator, Civil Service (Medical): No    Lack of Transportation (Non-Medical): No  Physical Activity: Insufficiently Active (05/01/2022)   Exercise Vital Sign    Days of Exercise per Week: 7 days    Minutes of Exercise per Session: 10 min  Stress: No Stress Concern Present (05/01/2022)   [employer] of Occupational Health - Occupational Stress Questionnaire    Feeling of Stress : Not at all  Social Connections: Socially Isolated (05/01/2022)   Social Connection and Isolation Panel    Frequency of Communication with Friends and Family: More than three times a week    Frequency of Social Gatherings with Friends and Family: More than three times a week    Attends Religious Services: Never    Database Administrator or Organizations: No    Attends Banker Meetings: Never    Marital Status: Widowed    Outpatient Medications Prior to Visit  Medication  Sig Dispense Refill   Cholecalciferol (VITAMIN D3) 125 MCG (5000 UT) CAPS Take 1 capsule (5,000 Units total) by mouth daily. 100 capsule 3   furosemide  (LASIX ) 20 MG tablet Take 1 tablet (20 mg total) by mouth daily as needed for fluid or edema. 60  tablet 3   inclisiran (LEQVIO ) 284 MG/1.5ML SOSY injection Inject 284 mg into the skin once.     nebivolol  (BYSTOLIC ) 5 MG tablet TAKE 1 TABLET DAILY 90 tablet 0   tamoxifen  (NOLVADEX ) 20 MG tablet TAKE 1 TABLET BY MOUTH EVERY DAY 90 tablet 1   No facility-administered medications prior to visit.     EXAM:  BP 122/78 (BP Location: Right Arm, Patient Position: Sitting, Cuff Size: Large)   Pulse 75   Temp 98.5 F (36.9 C) (Oral)   Ht 5' 2 (1.575 m)   Wt 199 lb 3.2 oz (90.4 kg)   SpO2 97%   BMI 36.43 kg/m   Body mass index is 36.43 kg/m. Wt Readings from Last 3 Encounters:  05/13/24 199 lb 3.2 oz (90.4 kg)  05/12/24 200 lb 1.6 oz (90.8 kg)  03/17/24 198 lb 4 oz (89.9 kg)    Physical Exam: Vital signs reviewed HZW:Uypd is a well-developed well-nourished alert cooperative    who appearsr stated age in no acute distress.  HEENT: normocephalic atraumatic , Eyes: PERRL EOM's full, conjunctiva clear, Nares: paten,t no deformity discharge or tenderness., Ears: no deformity EAC's clear TMs with normal landmarks. Mouth:  Moist mucous membranes. Dentition in adequate repair. NECK: supple without masses, thyromegaly or bruits. CHEST/PULM:  Clear to auscultation and percussion breath sounds equal no wheeze , rales or rhonchi. No chest wall deformities or tenderness. Breast: changes r breast  No dimpling, discharge, masses, tenderness or discharge . CV: PMI is nondisplaced, S1 S2 no gallops, murmurs, rubs. Peripheral pulses are full without delay.No JVD .  ABDOMEN: Bowel sounds normal nontender  No guard or rebound, no hepato splenomegal no CVA tenderness.   Extremtities:  No clubbing cyanosis or edema, no acute joint swelling or redness no focal  atrophy NEURO:  Oriented x3, cranial nerves 3-12 appear to be intact, no obvious focal weakness,no abnormal reflexes or asymmetrical SKIN: No acute rashes normal turgor, color, no bruising or petechiae. PSYCH: Oriented, good eye contact, no obvious depression anxiety, cognition and judgment appear normal. Memory not tested  LN: no cervical axillary  adenopathy  Lab Results  Component Value Date   WBC 6.1 05/12/2024   HGB 13.2 05/12/2024   HCT 39.9 05/12/2024   PLT 224 05/12/2024   GLUCOSE 96 05/12/2024   CHOL 145 02/14/2024   TRIG 87 02/14/2024   HDL 58 02/14/2024   LDLDIRECT 193.0 02/16/2021   LDLCALC 71 02/14/2024   ALT 12 05/12/2024   AST 14 (L) 05/12/2024   NA 142 05/12/2024   K 4.6 05/12/2024   CL 110 05/12/2024   CREATININE 0.82 05/12/2024   BUN 13 05/12/2024   CO2 26 05/12/2024   TSH 1.45 02/06/2024   HGBA1C 5.9 02/16/2021   MICROALBUR 1.4 12/01/2009    BP Readings from Last 3 Encounters:  05/13/24 122/78  05/12/24 120/70  03/03/24 122/80    Lab results reviewed in record  uric acid last some 2 years ago and elevated   ASSESSMENT AND PLAN:  Discussed the following assessment and plan:    ICD-10-CM   1. Essential hypertension  I10       Disc  allopurinol  at this time will  delay  and next blood test check urinc acid but if gettng recurrent gout   may go back on  suppressive therapy Reviewed safety measures since lives alone  . She  uses memory cures and has acess to  phone etc .  No follow-ups on file.  Patient  Care Team: Charlett Apolinar POUR, MD as PCP - General (Internal Medicine) Elmira Newman PARAS, MD as PCP - Cardiology (Cardiology) Leni Marjory MATSU, MD as Referring Physician (Rheumatology) Belinda Cough, MD as Consulting Physician (General Surgery) Lanny Callander, MD as Consulting Physician (Hematology) Izell Domino, MD as Attending Physician (Radiation Oncology) Eldonna Novel, MD as Consulting Physician (Physical Medicine and  Rehabilitation) Elmira Newman PARAS, MD as Consulting Physician (Cardiology) [facility], Donell Cardinal, MD as Attending Physician (Endocrinology) Patient Instructions  Good to see you today .  We can  check uric acid  at some point    but if not having gout then  may not   need to take the allopurinol  since  is a  preventive medication.  BP is good  today .  Plan fu depending   and keeping appts with the specialty team.      Lab Results  Component Value Date   WBC 6.1 05/12/2024   HGB 13.2 05/12/2024   HCT 39.9 05/12/2024   PLT 224 05/12/2024   GLUCOSE 96 05/12/2024   CHOL 145 02/14/2024   TRIG 87 02/14/2024   HDL 58 02/14/2024   LDLDIRECT 193.0 02/16/2021   LDLCALC 71 02/14/2024   ALT 12 05/12/2024   AST 14 (L) 05/12/2024   NA 142 05/12/2024   K 4.6 05/12/2024   CL 110 05/12/2024   CREATININE 0.82 05/12/2024   BUN 13 05/12/2024   CO2 26 05/12/2024   TSH 1.45 02/06/2024   HGBA1C 5.9 02/16/2021   MICROALBUR 1.4 12/01/2009       Kiam Bransfield K. Valeriano Bain M.D.

## 2024-05-13 NOTE — Patient Instructions (Signed)
 Good to see you today .  We can  check uric acid  at some point    but if not having gout then  may not   need to take the allopurinol  since  is a  preventive medication.  BP is good  today .  Plan fu depending   and keeping appts with the specialty team.      Lab Results  Component Value Date   WBC 6.1 05/12/2024   HGB 13.2 05/12/2024   HCT 39.9 05/12/2024   PLT 224 05/12/2024   GLUCOSE 96 05/12/2024   CHOL 145 02/14/2024   TRIG 87 02/14/2024   HDL 58 02/14/2024   LDLDIRECT 193.0 02/16/2021   LDLCALC 71 02/14/2024   ALT 12 05/12/2024   AST 14 (L) 05/12/2024   NA 142 05/12/2024   K 4.6 05/12/2024   CL 110 05/12/2024   CREATININE 0.82 05/12/2024   BUN 13 05/12/2024   CO2 26 05/12/2024   TSH 1.45 02/06/2024   HGBA1C 5.9 02/16/2021   MICROALBUR 1.4 12/01/2009

## 2024-05-16 ENCOUNTER — Ambulatory Visit

## 2024-05-16 ENCOUNTER — Encounter: Admitting: Psychology

## 2024-05-16 VITALS — BP 113/78 | HR 79 | Temp 98.2°F | Resp 16 | Ht 64.0 in | Wt 199.8 lb

## 2024-05-16 DIAGNOSIS — E782 Mixed hyperlipidemia: Secondary | ICD-10-CM

## 2024-05-16 MED ORDER — INCLISIRAN SODIUM 284 MG/1.5ML ~~LOC~~ SOSY
284.0000 mg | PREFILLED_SYRINGE | Freq: Once | SUBCUTANEOUS | Status: AC
  Administered 2024-05-16: 284 mg via SUBCUTANEOUS
  Filled 2024-05-16: qty 1.5

## 2024-05-16 NOTE — Progress Notes (Signed)
 Diagnosis: Hyperlipidemia  Provider:  Chilton Greathouse MD  Procedure: Injection  Leqvio (inclisiran), Dose: 284 mg, Site: subcutaneous, Number of injections: 1  Injection Site(s): Right arm  Post Care: Patient declined observation  Discharge: Condition: Stable, Destination: Home . AVS Declined  Performed by:  Wyvonne Lenz, RN

## 2024-05-18 ENCOUNTER — Encounter: Payer: Self-pay | Admitting: Nurse Practitioner

## 2024-05-18 ENCOUNTER — Encounter: Payer: Self-pay | Admitting: Hematology

## 2024-05-18 ENCOUNTER — Encounter: Payer: Self-pay | Admitting: Cardiology

## 2024-05-29 ENCOUNTER — Ambulatory Visit: Payer: Self-pay

## 2024-05-29 DIAGNOSIS — M549 Dorsalgia, unspecified: Secondary | ICD-10-CM

## 2024-05-29 NOTE — Telephone Encounter (Signed)
 FYI Only or Action Required?: Action required by provider: referral request.  Patient was last seen in primary care on 05/13/2024 by [name], Christy POUR, MD.  Called Nurse Triage reporting Back Pain.  Symptoms began several days ago.  Interventions attempted: Prescription medications: advil.  Symptoms are: gradually worsening.  Triage Disposition: Home Care  Patient/caregiver understands and will follow disposition?: Yes  Copied from CRM #[id number]. Topic: Clinical - Red Word Triage >> May 29, 2024  2:29 PM Rea ORN wrote: Red Word that prompted transfer to Nurse Triage: Back pain. Pt would like referral to ortho   Reason for Disposition  Caused by overuse from recent vigorous activity (e.g., exercise, gardening, lifting and carrying, sports)  Answer Assessment - Initial Assessment Questions Patient requesting referral to be sent to Dr Eldonna on [address] [phone]. Has had injections before for this before with him. It has been over two years since she has seen them and needs a new referral. Offered appointment with Dr Charlett but patient only wants referral. 1. ONSET: When did the pain begin? (e.g., minutes, hours, days)     The weekend 2. LOCATION: Where does it hurt? (upper, mid or lower back)     Lower back, buttocks 3. SEVERITY: How bad is the pain?  (e.g., Scale 1-10; mild, moderate, or severe)     8 4. PATTERN: Is the pain constant? (e.g., yes, no; constant, intermittent)      Intermittent 5. RADIATION: Does the pain shoot into your legs or somewhere else?     Left leg  6. CAUSE:  What do you think is causing the back pain?      Has had this problem before  7. BACK OVERUSE:  Any recent lifting of heavy objects, strenuous work or exercise?     Took a road trip and was bending over gardening prior to pain. 8. MEDICINES: What have you taken so far for the pain? (e.g., nothing, acetaminophen , NSAIDS)     Advil 9. NEUROLOGIC SYMPTOMS: Do you have any weakness,  numbness, or problems with bowel/bladder control?     Numbness in left arm that is associated with this intermitten back pain 10. OTHER SYMPTOMS: Do you have any other symptoms? (e.g., fever, abdomen pain, burning with urination, blood in urine)   denies  Protocols used: Back Pain-A-AH

## 2024-06-02 ENCOUNTER — Ambulatory Visit: Admitting: Neurology

## 2024-06-04 NOTE — Telephone Encounter (Signed)
 Contacted pt. Left detail message that the referral is placed. Someone should contact her to set up an appointment. Call us  back if have any questions

## 2024-06-10 ENCOUNTER — Other Ambulatory Visit: Payer: Self-pay | Admitting: Internal Medicine

## 2024-06-11 DIAGNOSIS — R928 Other abnormal and inconclusive findings on diagnostic imaging of breast: Secondary | ICD-10-CM | POA: Diagnosis not present

## 2024-06-11 LAB — HM MAMMOGRAPHY

## 2024-06-12 ENCOUNTER — Encounter: Payer: Self-pay | Admitting: Internal Medicine

## 2024-07-01 ENCOUNTER — Ambulatory Visit: Admitting: Physical Medicine and Rehabilitation

## 2024-07-01 ENCOUNTER — Encounter: Payer: Self-pay | Admitting: Physical Medicine and Rehabilitation

## 2024-07-01 DIAGNOSIS — M4306 Spondylolysis, lumbar region: Secondary | ICD-10-CM

## 2024-07-01 DIAGNOSIS — G8929 Other chronic pain: Secondary | ICD-10-CM | POA: Diagnosis not present

## 2024-07-01 DIAGNOSIS — M5416 Radiculopathy, lumbar region: Secondary | ICD-10-CM

## 2024-07-01 DIAGNOSIS — M5442 Lumbago with sciatica, left side: Secondary | ICD-10-CM

## 2024-07-01 NOTE — Progress Notes (Signed)
 Pain Scale   Average Pain 0 Patient advising she has lower back pain that radiates to left knee causing increased pain.        +Driver, -BT, -Dye Allergies.

## 2024-07-01 NOTE — Progress Notes (Signed)
 "  KITT MINARDI - 79 y.o. female MRN [id number]  Date of birth: 1946/06/04  Office Visit Note: Visit Date: 07/01/2024 PCP: Charlett Apolinar POUR, MD Referred by: Charlett Apolinar POUR, MD  Subjective: Chief Complaint  Patient presents with   Lower Back - Pain   HPI: WILLEAN SCHURMAN is a 79 y.o. female who comes in today for evaluation of chronic bilateral lower back pain radiating down left lateral leg to ankle. Pain ongoing for several years, worsened several weeks ago after working with potted plants. Her pain worsens with movement and activity. She describes pain as sore and aching sensation, currently rates as 5 out of 10. Some relief of pain with home exercise regimen, rest and use of medications. History of formal physical therapy with some relief of pain. Lumbar MRI imaging from 2023 shows L5 chronic pars defects with L5-S1 anterolisthesis and degeneration causing biforaminal L5 compression. Patient underwent right L5 transforaminal epidural steroid injection in our office on 05/08/2022. She reports greater than 50% relief of pain for several years. Patient denies focal weakness, numbness and tingling. No recent trauma or falls. She is currently using cane to assist with ambulation.   Patient has history of breast cancer, underwent left breast lumpectomy in 2022, chemotherapy and radiation in 2023.      Review of Systems  Musculoskeletal:  Positive for back pain.  Neurological:  Negative for tingling, sensory change, focal weakness and weakness.  All other systems reviewed and are negative.  Otherwise per HPI.  Assessment & Plan: Visit Diagnoses:    ICD-10-CM   1. Chronic bilateral low back pain with left-sided sciatica  G89.29 Ambulatory referral to Physical Medicine Rehab   M54.42     2. Lumbar radiculopathy  M54.16 Ambulatory referral to Physical Medicine Rehab    3. Pars defect of lumbar spine  M43.06 Ambulatory referral to Physical Medicine Rehab       Plan: Findings:  Chronic  bilateral lower back pain radiating down left lateral leg to ankle. Patient continues to have pain despite good conservative therapies such as formal physical therapy, home exercise regimen, rest and use of medications. Patients clinical presentation and exam are consistent with lumbar radiculopathy, more of L5 nerve distribution. There are chronic pars defects at L5, along with anterolisthesis and biforaminal L5 compression. She has done well with transforaminal epidural steroid injections in the past. Next step is to place order for left L5 transforaminal epidural steroid injection under fluoroscopic guidance. If good relief of pain with injection we can repeat this procedure infrequently as needed. Should her pain persist would recommend obtaining new lumbar MRI imaging at some point. She has no questions at this time. No red flag symptoms noted upon exam today.   She plans on scheduling appointment with one of our orthopedic surgeons for further evaluation of her chronic bilateral knee pain. I recommended Dr. Jerri, Dr. Addie or Dr. Vernetta, any of these providers would be happy to evaluate her knees.     Meds & Orders: No orders of the defined types were placed in this encounter.   Orders Placed This Encounter  Procedures   Ambulatory referral to Physical Medicine Rehab    Follow-up: Return for Left L5 transforaminal epidural steroid injection.   Procedures: No procedures performed      Clinical History: MRI LUMBAR SPINE WITHOUT AND WITH CONTRAST   TECHNIQUE: Multiplanar and multiecho pulse sequences of the lumbar spine were obtained without and with intravenous contrast.   CONTRAST:  10mL  GADAVIST  GADOBUTROL  1 MMOL/ML IV SOLN   COMPARISON:  07/05/2020   FINDINGS: Segmentation:  5 lumbar type vertebrae   Alignment:  L5-S1 grade 1/2 anterolisthesis.  Mild scoliosis.   Vertebrae: Chronic L5 pars defects. No evidence of fracture, discitis, or bone lesion. Sacroiliac osteoarthritis.    Conus medullaris and cauda equina: Conus extends to the L1 level. Conus and cauda equina appear normal. 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   Paraspinal and other soft tissues: Negative for perispinal mass or inflammation.   Disc levels:   T12- L1: Unremarkable.   L1-L2: Mild disc bulging.  Mild facet spurring   L2-L3: Disc narrowing and bulging with mild facet spurring. The canal and foramina are patent   L3-L4: Degenerative facet spurring, bulky on the right. Mild disc bulging.   L4-L5: Degenerative disc collapse with endplate and facet spurring bilaterally. The canal and foramina are patent   L5-S1:L5 chronic pars defects with anterolisthesis, disc collapse, disc bulging. Degenerative facet spurring. Biforaminal L5 compression.   IMPRESSION: 1. 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 2. Noncompressive spinal degeneration elsewhere.  Mild scoliosis. 3. Negative for metastatic disease.     Electronically Signed   By: Dorn Roulette M.D.   On: 10/24/2021 07:27   She reports that she has never smoked. She has never used smokeless tobacco. No results for input(s): HGBA1C, LABURIC in the last 8760 hours.  Objective:  VS:  HT:    WT:   BMI:     BP:   HR: bpm  TEMP: ( )  RESP:  Physical Exam Vitals and nursing note reviewed.  HENT:     Head: Normocephalic and atraumatic.     Right Ear: External ear normal.     Left Ear: External ear normal.     Nose: Nose normal.     Mouth/Throat:     Mouth: Mucous membranes are moist.  Eyes:     Extraocular Movements: Extraocular movements intact.  Cardiovascular:     Rate and Rhythm: Normal rate.     Pulses: Normal pulses.  Pulmonary:     Effort: Pulmonary effort is normal.  Abdominal:     General: Abdomen is flat. There is no distension.  Musculoskeletal:        General: Tenderness present.     Cervical back: Normal range of motion.     Comments: Patient is  slow to rise from seated position to standing. Good lumbar range of motion. No pain noted with facet loading. 5/5 strength noted with bilateral hip flexion, knee flexion/extension, ankle dorsiflexion/plantarflexion and EHL. No clonus noted bilaterally. No pain upon palpation of greater trochanters. No pain with internal/external rotation of bilateral hips. Sensation intact bilaterally. Negative slump test bilaterally. Positive straight leg raise on the left. Dysesthesias noted to left L5 dermatome. Ambulates with cane, gait slow and unsteady.    Skin:    General: Skin is warm and dry.     Capillary Refill: Capillary refill takes less than 2 seconds.  Neurological:     General: No focal deficit present.     Mental Status: She is alert and oriented to person, place, and time.  Psychiatric:        Mood and Affect: Mood normal.        Behavior: Behavior normal.     Ortho Exam  Imaging: No results found.  Past Medical/Family/Surgical/Social History: Medications & Allergies reviewed per EMR, new medications updated. Patient Active Problem List   Diagnosis  Date Noted   Statin myopathy 06/29/2022   Malignant neoplasm of upper-outer quadrant of left breast in female, estrogen receptor positive (HCC) 05/30/2021   Pain of left lower extremity 03/16/2021   Exertional dyspnea 03/08/2021   Decreased exercise tolerance 03/08/2021   Hyperparathyroidism 02/09/2018   Abnormal prominence of clavicle 02/07/2018   Psoriasis 10/23/2017   Carpal tunnel syndrome 08/23/2015   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   H/O: gout 08/08/2015   Elevated blood sugar 08/08/2015   Essential hypertension 07/09/2014   Mixed hyperlipidemia 07/09/2014   Body aches 07/09/2014   Urinary frequency 07/09/2014   Numbness and tingling in left hand 07/09/2014   Visit for preventive health examination 04/11/2013   Obesity (BMI 30-39.9) 04/11/2013   Statin intolerance 04/11/2013   At risk for  coronary artery disease 05/15/2012   Family history of premature coronary heart disease 04/23/2012   Fatigue 12/29/2010   Nonspecific abnormal electrocardiogram (ECG) (EKG) 12/29/2010   Hyperuricemia 12/29/2010   Palpitations    OBESITY 01/10/2010   HYPERLIPIDEMIA 11/09/2009   Gout 11/09/2009   Essential hypertension 11/09/2009   Arthropathy 11/09/2009   Past Medical History:  Diagnosis Date   ARTHRITIS    Breast cancer (HCC)    Carpal tunnel syndrome 08/23/2015   Left   Exertional dyspnea    Fatigue    Gout, unspecified    Hyperlipidemia    Hypertension    meds since age 74    Hyperuricemia    Nonspecific abnormal electrocardiogram (ECG) (EKG)    t wave non acute     OBESITY    Palpitations    hospitalized 2005 felt from stress neg cards eval   Primary hyperparathyroidism    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   Family History  Problem Relation Age of Onset   Hypertension Mother    Arthritis Mother    Alcohol abuse Father        deceased   Diabetes Father    Lymphoma Sister        non hodgkins   Heart attack Brother    Lung cancer Brother    Lung cancer Brother    Prostate cancer Brother        dx after 61   Lung cancer Brother    Heart attack Son 28       1999   Hyperparathyroidism Neg Hx    Past Surgical History:  Procedure Laterality Date   ABDOMINAL HYSTERECTOMY     BREAST BIOPSY     right side   BREAST LUMPECTOMY WITH RADIOACTIVE SEED AND SENTINEL LYMPH NODE BIOPSY Left 06/07/2021   Procedure: LEFT BREAST LUMPECTOMY WITH RADIOACTIVE SEED X2 AND AXILLARY SENTINEL LYMPH NODE BIOPSY;  Surgeon: Belinda Cough, MD;  Location: [facility] OR;  Service: General;  Laterality: Left;   COLONOSCOPY     FACIAL COSMETIC SURGERY  1980   Social History   Occupational History   Occupation: retired   Tobacco Use   Smoking status: Never   Smokeless tobacco: Never  Vaping Use   Vaping status: Never Used  Substance and Sexual Activity    Alcohol use: Yes    Alcohol/week: 3.0 standard drinks of alcohol    Types: 3 Glasses of wine per week   Drug use: No   Sexual activity: Not Currently    Birth control/protection: Surgical    Comment: Hysterectomy    "

## 2024-07-07 ENCOUNTER — Telehealth: Payer: Self-pay

## 2024-07-07 ENCOUNTER — Encounter: Payer: Self-pay | Admitting: *Deleted

## 2024-07-07 NOTE — Telephone Encounter (Signed)
 Auth Submission: NO AUTH NEEDED Site of care: Site of care: [facility] Payer: Medicare A/B with [employer] health supplement Medication & CPT/J Code(s) submitted: Leqvio  (Inclisiran) J1306 Diagnosis Code:  Route of submission (phone, fax, portal):  Phone # Fax # Auth type: Buy/Bill PB Units/visits requested: 284mg  x 2 doses Reference number:  Approval from: 07/07/24 to 07/26/25

## 2024-07-15 ENCOUNTER — Ambulatory Visit: Admitting: Orthopaedic Surgery

## 2024-07-15 ENCOUNTER — Telehealth: Payer: Self-pay

## 2024-07-15 ENCOUNTER — Other Ambulatory Visit (INDEPENDENT_AMBULATORY_CARE_PROVIDER_SITE_OTHER): Payer: Self-pay

## 2024-07-15 VITALS — Ht 62.5 in | Wt 196.4 lb

## 2024-07-15 DIAGNOSIS — G8929 Other chronic pain: Secondary | ICD-10-CM | POA: Diagnosis not present

## 2024-07-15 DIAGNOSIS — M1712 Unilateral primary osteoarthritis, left knee: Secondary | ICD-10-CM | POA: Diagnosis not present

## 2024-07-15 DIAGNOSIS — M25562 Pain in left knee: Secondary | ICD-10-CM

## 2024-07-15 NOTE — Telephone Encounter (Signed)
 Patient given surgical clearance form for PCP. Aware that we must receive clearance form back before being able to proceed with scheduling surgery.

## 2024-07-15 NOTE — Progress Notes (Signed)
 "  Office Visit Note   Patient: Christy Young           Date of Birth: 18-May-1946           MRN: [id number] Visit Date: 07/15/2024              Requested by: Charlett Apolinar POUR, MD [address] [place],  [place] [address] PCP: Charlett Apolinar POUR, MD   Assessment & Plan: Visit Diagnoses:  1. Primary osteoarthritis of left knee     Plan: Impression is severe left knee degenerative joint disease secondary to Osteoarthritis.  Patient has attempted conservative treatment for at least 6 consecutive weeks within the past 12 weeks, including but not limited to physical therapy, home exercise program, NSAIDs, activity modification, and/or corticosteroid injections. Despite these efforts, symptoms have not improved or have worsened. Conservative measures have been deemed unsuccessful at this time. After a detailed discussion covering diagnosis and treatment options--including the risks, benefits, alternatives, and potential complications of surgical and nonsurgical management--the patient elected to proceed with surgery  Anticoagulants: No antithrombotic Postop anticoagulation: Eliquis Diabetic: No  Nickel allergy: No Prior DVT/PE: No Tobacco use: No Clearances needed for surgery: PCP Anticipated discharge dispo: Home   Follow-Up Instructions: No follow-ups on file.   Orders:  Orders Placed This Encounter  Procedures   XR KNEE 3 VIEW LEFT   No orders of the defined types were placed in this encounter.     Procedures: No procedures performed   Clinical Data: No additional findings.   Subjective: Chief Complaint  Patient presents with   Left Knee - Pain    HPI Christy Young is a very pleasant 79 year old female with longstanding left knee pain and valgus deformity.  She has had ongoing symptoms and pain with buckling and falling as a result.  It is affecting her ADLs significantly now.  Referral from Dr. Eldonna for surgical consultation.  She does not feel stable because of the left  knee. Review of Systems  Constitutional: Negative.   HENT: Negative.    Eyes: Negative.   Respiratory: Negative.    Cardiovascular: Negative.   Endocrine: Negative.   Musculoskeletal: Negative.   Neurological: Negative.   Hematological: Negative.   Psychiatric/Behavioral: Negative.    All other systems reviewed and are negative.    Objective: Vital Signs: Ht 5' 2.5 (1.588 m)   Wt 196 lb 6.4 oz (89.1 kg)   BMI 35.35 kg/m   Physical Exam Vitals and nursing note reviewed.  Constitutional:      Appearance: She is well-developed.  HENT:     Head: Atraumatic.     Nose: Nose normal.  Eyes:     Extraocular Movements: Extraocular movements intact.  Cardiovascular:     Pulses: Normal pulses.  Pulmonary:     Effort: Pulmonary effort is normal.  Abdominal:     Palpations: Abdomen is soft.  Musculoskeletal:     Cervical back: Neck supple.  Skin:    General: Skin is warm.     Capillary Refill: Capillary refill takes less than 2 seconds.  Neurological:     Mental Status: She is alert. Mental status is at baseline.  Psychiatric:        Behavior: Behavior normal.        Thought Content: Thought content normal.        Judgment: Judgment normal.     Ortho Exam Exam the left knee shows valgus alignment.  Lateral joint line tenderness.  Pain throughout range of motion.  Crepitus with range of motion.  Slight valgus laxity. Specialty Comments:  MRI LUMBAR SPINE WITHOUT AND WITH CONTRAST   TECHNIQUE: Multiplanar and multiecho pulse sequences of the lumbar spine were obtained without and with intravenous contrast.   CONTRAST:  10mL GADAVIST  GADOBUTROL  1 MMOL/ML IV SOLN   COMPARISON:  07/05/2020   FINDINGS: Segmentation:  5 lumbar type vertebrae   Alignment:  L5-S1 grade 1/2 anterolisthesis.  Mild scoliosis.   Vertebrae: Chronic L5 pars defects. No evidence of fracture, discitis, or bone lesion. Sacroiliac osteoarthritis.   Conus medullaris and cauda equina: Conus  extends to the L1 level. Conus and cauda equina appear normal. 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   Paraspinal and other soft tissues: Negative for perispinal mass or inflammation.   Disc levels:   T12- L1: Unremarkable.   L1-L2: Mild disc bulging.  Mild facet spurring   L2-L3: Disc narrowing and bulging with mild facet spurring. The canal and foramina are patent   L3-L4: Degenerative facet spurring, bulky on the right. Mild disc bulging.   L4-L5: Degenerative disc collapse with endplate and facet spurring bilaterally. The canal and foramina are patent   L5-S1:L5 chronic pars defects with anterolisthesis, disc collapse, disc bulging. Degenerative facet spurring. Biforaminal L5 compression.   IMPRESSION: 1. 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 2. Noncompressive spinal degeneration elsewhere.  Mild scoliosis. 3. Negative for metastatic disease.     Electronically Signed   By: Dorn Roulette M.D.   On: 10/24/2021 07:27  Imaging: XR KNEE 3 VIEW LEFT Result Date: 07/15/2024 X-rays of the left knee show advanced tricompartmental osteoarthritis.  Bone-on-bone joint space narrowing.  Kellgren-Lawrence stage IV.  Valgus deformity.    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   H/O: gout 08/08/2015   Elevated blood sugar 08/08/2015   Essential hypertension 07/09/2014   Mixed  hyperlipidemia 07/09/2014   Body aches 07/09/2014   Urinary frequency 07/09/2014   Numbness and tingling in left hand 07/09/2014   Visit for preventive health examination 04/11/2013   Obesity (BMI 30-39.9) 04/11/2013   Statin intolerance 04/11/2013   At risk for coronary artery disease 05/15/2012   Family history of premature coronary heart disease 04/23/2012   Fatigue 12/29/2010   Nonspecific abnormal electrocardiogram (ECG) (EKG) 12/29/2010   Hyperuricemia 12/29/2010   Palpitations    OBESITY 01/10/2010   HYPERLIPIDEMIA 11/09/2009   Gout 11/09/2009   Essential hypertension 11/09/2009   Arthropathy 11/09/2009   Past Medical History:  Diagnosis Date   ARTHRITIS    Breast cancer (HCC)    Carpal tunnel syndrome 08/23/2015   Left   Exertional dyspnea    Fatigue    Gout, unspecified    Hyperlipidemia    Hypertension    meds since age 32    Hyperuricemia    Nonspecific abnormal electrocardiogram (ECG) (EKG)    t wave non acute     OBESITY    Palpitations    hospitalized 2005 felt from stress neg  cards eval   Primary hyperparathyroidism    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    Family History  Problem Relation Age of Onset   Hypertension Mother    Arthritis Mother    Alcohol abuse Father        deceased   Diabetes Father    Lymphoma Sister        non hodgkins   Heart attack Brother    Lung cancer Brother    Lung cancer Brother    Prostate cancer Brother        dx after 53   Lung cancer Brother    Heart attack Son 77       1999   Hyperparathyroidism Neg Hx     Past Surgical History:  Procedure Laterality Date   ABDOMINAL HYSTERECTOMY     BREAST BIOPSY     right side   BREAST LUMPECTOMY WITH RADIOACTIVE SEED AND SENTINEL LYMPH NODE BIOPSY Left 06/07/2021   Procedure: LEFT BREAST LUMPECTOMY WITH RADIOACTIVE SEED X2 AND AXILLARY SENTINEL LYMPH NODE BIOPSY;  Surgeon: Belinda Cough, MD;  Location: [facility] OR;  Service: General;  Laterality:  Left;   COLONOSCOPY     FACIAL COSMETIC SURGERY  1980   Social History   Occupational History   Occupation: retired   Tobacco Use   Smoking status: Never   Smokeless tobacco: Never  Vaping Use   Vaping status: Never Used  Substance and Sexual Activity   Alcohol use: Yes    Alcohol/week: 3.0 standard drinks of alcohol    Types: 3 Glasses of wine per week   Drug use: No   Sexual activity: Not Currently    Birth control/protection: Surgical    Comment: Hysterectomy        "

## 2024-07-18 ENCOUNTER — Telehealth: Payer: Self-pay | Admitting: Internal Medicine

## 2024-07-18 NOTE — Telephone Encounter (Signed)
 Patient dropped off document Surgical Clearance, to be filled out by provider. Patient requested to send it back via Call Patient to pick up within 7-days. Document is located in providers tray at front office.Please advise at [facility] [phone]

## 2024-07-22 NOTE — Telephone Encounter (Signed)
 Form was faxed. Pt was notify.   No further action is needed at this time.

## 2024-07-24 ENCOUNTER — Encounter: Admitting: Physical Medicine and Rehabilitation

## 2024-08-12 ENCOUNTER — Encounter: Admitting: Physical Medicine and Rehabilitation

## 2024-09-01 ENCOUNTER — Ambulatory Visit: Admitting: Internal Medicine

## 2024-09-15 ENCOUNTER — Ambulatory Visit: Attending: Surgery

## 2024-11-10 ENCOUNTER — Inpatient Hospital Stay: Admitting: Nurse Practitioner

## 2024-11-10 ENCOUNTER — Inpatient Hospital Stay

## 2024-11-14 ENCOUNTER — Ambulatory Visit
